# Patient Record
Sex: Female | Born: 1955 | Race: White | Hispanic: No | Smoking: Current every day smoker
Health system: Southern US, Community
[De-identification: ages and names within clinical notes are randomized; demographics above are authoritative.]

## PROBLEM LIST (undated history)

## (undated) DIAGNOSIS — Z950 Presence of cardiac pacemaker: Secondary | ICD-10-CM

## (undated) DIAGNOSIS — J449 Chronic obstructive pulmonary disease, unspecified: Secondary | ICD-10-CM

## (undated) DIAGNOSIS — E039 Hypothyroidism, unspecified: Secondary | ICD-10-CM

## (undated) DIAGNOSIS — I639 Cerebral infarction, unspecified: Secondary | ICD-10-CM

## (undated) DIAGNOSIS — I219 Acute myocardial infarction, unspecified: Secondary | ICD-10-CM

## (undated) DIAGNOSIS — Z95810 Presence of automatic (implantable) cardiac defibrillator: Secondary | ICD-10-CM

## (undated) HISTORY — PX: ABDOMINAL HYSTERECTOMY: SHX81

## (undated) HISTORY — PX: CHOLECYSTECTOMY: SHX55

## (undated) HISTORY — PX: INSERT / REPLACE / REMOVE PACEMAKER: SUR710

## (undated) HISTORY — PX: THYROIDECTOMY: SHX17

## (undated) HISTORY — PX: TONSILLECTOMY: SUR1361

---

## 2003-10-01 ENCOUNTER — Other Ambulatory Visit: Payer: Self-pay

## 2004-04-14 ENCOUNTER — Other Ambulatory Visit: Payer: Self-pay

## 2005-01-28 ENCOUNTER — Emergency Department: Payer: Self-pay | Admitting: Unknown Physician Specialty

## 2005-01-28 ENCOUNTER — Other Ambulatory Visit: Payer: Self-pay

## 2006-06-06 DIAGNOSIS — I10 Essential (primary) hypertension: Secondary | ICD-10-CM | POA: Insufficient documentation

## 2006-12-19 ENCOUNTER — Ambulatory Visit: Payer: Self-pay | Admitting: Internal Medicine

## 2007-01-14 ENCOUNTER — Other Ambulatory Visit: Payer: Self-pay

## 2007-01-15 ENCOUNTER — Inpatient Hospital Stay: Payer: Self-pay | Admitting: Internal Medicine

## 2007-05-30 DIAGNOSIS — I447 Left bundle-branch block, unspecified: Secondary | ICD-10-CM | POA: Insufficient documentation

## 2007-05-31 DIAGNOSIS — K219 Gastro-esophageal reflux disease without esophagitis: Secondary | ICD-10-CM | POA: Insufficient documentation

## 2007-11-14 DIAGNOSIS — I4892 Unspecified atrial flutter: Secondary | ICD-10-CM | POA: Insufficient documentation

## 2008-12-09 DIAGNOSIS — K2210 Ulcer of esophagus without bleeding: Secondary | ICD-10-CM | POA: Insufficient documentation

## 2009-03-08 ENCOUNTER — Ambulatory Visit: Payer: Self-pay | Admitting: Internal Medicine

## 2009-11-17 DIAGNOSIS — E559 Vitamin D deficiency, unspecified: Secondary | ICD-10-CM | POA: Insufficient documentation

## 2010-12-30 DIAGNOSIS — E039 Hypothyroidism, unspecified: Secondary | ICD-10-CM | POA: Insufficient documentation

## 2011-03-02 DIAGNOSIS — G5600 Carpal tunnel syndrome, unspecified upper limb: Secondary | ICD-10-CM | POA: Insufficient documentation

## 2011-03-22 DIAGNOSIS — M5137 Other intervertebral disc degeneration, lumbosacral region: Secondary | ICD-10-CM | POA: Insufficient documentation

## 2011-03-29 DIAGNOSIS — G2581 Restless legs syndrome: Secondary | ICD-10-CM | POA: Insufficient documentation

## 2011-04-12 DIAGNOSIS — IMO0002 Reserved for concepts with insufficient information to code with codable children: Secondary | ICD-10-CM | POA: Insufficient documentation

## 2012-06-02 ENCOUNTER — Encounter: Payer: Self-pay | Admitting: Specialist

## 2012-06-07 DIAGNOSIS — M940 Chondrocostal junction syndrome [Tietze]: Secondary | ICD-10-CM | POA: Insufficient documentation

## 2012-06-13 ENCOUNTER — Encounter: Payer: Self-pay | Admitting: Specialist

## 2012-07-14 ENCOUNTER — Encounter: Payer: Self-pay | Admitting: Specialist

## 2012-08-01 DIAGNOSIS — F32A Depression, unspecified: Secondary | ICD-10-CM | POA: Insufficient documentation

## 2012-08-13 ENCOUNTER — Encounter: Payer: Self-pay | Admitting: Specialist

## 2012-12-01 LAB — COMPREHENSIVE METABOLIC PANEL
Albumin: 3.6 g/dL (ref 3.4–5.0)
Anion Gap: 10 (ref 7–16)
BUN: 30 mg/dL — ABNORMAL HIGH (ref 7–18)
Bilirubin,Total: 0.3 mg/dL (ref 0.2–1.0)
Chloride: 106 mmol/L (ref 98–107)
Co2: 22 mmol/L (ref 21–32)
Glucose: 152 mg/dL — ABNORMAL HIGH (ref 65–99)
Osmolality: 285 (ref 275–301)
SGPT (ALT): 44 U/L (ref 12–78)
Sodium: 138 mmol/L (ref 136–145)
Total Protein: 7.6 g/dL (ref 6.4–8.2)

## 2012-12-01 LAB — TROPONIN I: Troponin-I: <0.02 ng/mL

## 2012-12-01 LAB — CBC
HCT: 44.2 % (ref 35.0–47.0)
Platelet: 281 10*3/uL (ref 150–440)
RDW: 14 % (ref 11.5–14.5)
WBC: 8.5 10*3/uL (ref 3.6–11.0)

## 2012-12-02 ENCOUNTER — Observation Stay: Payer: Self-pay | Admitting: Internal Medicine

## 2012-12-02 LAB — TROPONIN I
Troponin-I: <0.02 ng/mL
Troponin-I: <0.02 ng/mL

## 2012-12-02 LAB — CK TOTAL AND CKMB (NOT AT ARMC)
CK, Total: 97 U/L
CK-MB: 1.6 ng/mL (ref 0.5–3.6)
CK-MB: 1.9 ng/mL

## 2013-01-16 DIAGNOSIS — M47817 Spondylosis without myelopathy or radiculopathy, lumbosacral region: Secondary | ICD-10-CM | POA: Insufficient documentation

## 2013-07-10 DIAGNOSIS — J441 Chronic obstructive pulmonary disease with (acute) exacerbation: Secondary | ICD-10-CM | POA: Insufficient documentation

## 2013-07-11 DIAGNOSIS — R32 Unspecified urinary incontinence: Secondary | ICD-10-CM | POA: Insufficient documentation

## 2013-11-29 DIAGNOSIS — G4700 Insomnia, unspecified: Secondary | ICD-10-CM | POA: Insufficient documentation

## 2013-12-22 ENCOUNTER — Emergency Department: Payer: Self-pay | Admitting: Emergency Medicine

## 2014-07-16 DIAGNOSIS — G8929 Other chronic pain: Secondary | ICD-10-CM | POA: Insufficient documentation

## 2014-11-25 ENCOUNTER — Emergency Department: Payer: Self-pay | Admitting: Emergency Medicine

## 2015-01-03 NOTE — Discharge Summary (Signed)
PATIENT NAME:  Rebecca Singh, Rebecca Singh MR#:  [id number] DATE OF BIRTH:  12-07-1955  DATE OF ADMISSION:  12/02/2012 DATE OF DISCHARGE:  12/03/2012  PRIMARY CARE PHYSICIAN:  Graylon Good at [facility] family clinic.   FINAL DIAGNOSES: 1.  Chest pain and back pain, likely musculoskeletal in nature.  2.  Relative hypotension and bradycardia.  3.  History of coronary artery disease.  4.  History of congestive heart failure, no signs on this hospital stay.  5.  Hypothyroidism.   MEDICATIONS ON DISCHARGE:  Include:  Wellbutrin 150 mg extended release 1 tablet twice a day, levothyroxine 150 mcg 1 tablet daily, Paxil 20 mg 3 tablets at bedtime, clonazepam 0.5 mg 3 times a day, trazodone 50 mg 3 times a day, furosemide 20 mg daily. Prednisone 5 mg, 4 tablets day 1, 3 tablets day 2, 2 tablets day 3, 1 tablet day 4 and 5.  Voltaren sodium 100 mg daily, acetaminophen/oxycodone 325/5, 1 tablet every 4 hours as needed for pain; lisinopril 10 mg daily, metoprolol 25 mg daily, baclofen 10 mg 3 times a day as needed for muscle spasm,   DIET: Regular consistency, low sodium.   ACTIVITY: As tolerated.   FOLLOWUP:  In 1 to 2 weeks with Graylon Good at [facility] family care.   REASON FOR ADMISSION:  The patient was admitted as an observation December 02, 2012, and discharged December 03, 2012, came in with chest pain, more musculoskeletal in nature.   LABORATORY AND RADIOLOGICAL DATA DURING THE HOSPITAL COURSE:  Included: Troponin negative x 3. D-dimer borderline at 0.52. Glucose 152, BUN 30, creatinine 1.24, sodium 138, potassium 4.5, chloride 106, CO2 22, calcium 8.8, AST slightly elevated at 40. White blood cell count 8.5, H and H 14.8 and 44.2, platelet count of 281. Chest x-ray showed cannot exclude mild heart failure. CT scan of the chest showed findings consistent with bilateral  pulmonary mild infiltrates, cardiomegaly. A differential includes heart failure and/or pneumonitis. Cardiac enzymes x 3 were negative.     FINAL  DIAGNOSES HOSPITAL COURSE PER PROBLEM LIST:  1.  For the patient's chest pain and back pain, likely musculoskeletal in nature tender to palpation.  The patient's baclofen was increased to 3 times a day. The patient was given a trial of IV Solu-Medrol. Pain was better the next day, given a prednisone taper upon discharge. Likely musculoskeletal in nature. Cardiac enzymes were negative. This is not a myocardial infarction.  2.  Relative hypotension and bradycardia. The patient's Toprol dose was decreased down to 25 mg, lisinopril decreased down to 10 mg. Blood pressure 110/61 upon discharge, heart rate 65.  3.  History of heart disease. The patient is on a low-dose metoprolol.  4.  History of congestive heart failure. No signs on this hospital stay. Lungs were clear. I do not believe that this is heart failure. I do not believe that this is pneumonia either. The patient had no white count, no fever, no cough.  5.  For the patient's hypothyroidism, the patient is on levothyroxine.  6.  The patient is on numerous psychiatric medications, can follow up as outpatient. I made no changes in these. He is on a strange regimen with trazodone 3 times a day.   TIME SPENT ON DISCHARGE: 35 minutes.      ____________________________ Herschell Dimes. Renae Gloss, MD rjw:cs D: 12/03/2012 14:22:00 ET T: 12/03/2012 15:15:07 ET JOB#: [id number]  cc: Herschell Dimes. Renae Gloss, MD, <Dictator> DR. Graylon Good AT [facility] Salley Scarlet MD ELECTRONICALLY  SIGNED 12/13/2012 10:31

## 2015-01-03 NOTE — H&P (Signed)
PATIENT NAME:  Rebecca Singh, Rebecca Singh MR#:  [id number] DATE OF BIRTH:  March 16, 1956  DATE OF ADMISSION:  12/02/2012  PRIMARY CARE PHYSICIAN:   at [facility] family practice.     CHIEF COMPLAINT:  Chest pain.   HISTORY OF PRESENT ILLNESS:  The patient is a 59 year old white female with past medical history of hypertension, congestive heart failure with EF of 30% per the patient, restless legs syndrome who is trying to apply for disability, comes to the Emergency Department with complaints of chest pain on the right side of the chest, started at the back of the chest radiating all the way up to the left chest. The pain is 10 by intensity, sharp in nature. The patient experiences more squeezing pain in the left chest. The pain started at 3:00 p.m. of 21st March 2014. Workup in the Emergency Department of the EKG shows somewhat left bundle branch block; however, cardiac enzymes are negative. The patient was given nitroglycerin and morphine without much improvement in the pain. The patient states this was associated with shortness of breath, the pain also with some nausea.   PAST MEDICAL HISTORY: 1.  Hypertension.  2.  Congestive heart failure with EF of 30% per the patient. 3.  Restless legs syndrome.  4.  Depression, anxiety. 5.  Gastroesophageal reflux disease. 6.  Hiatal hernia. 7.  Carpal tunnel syndrome.   PAST SURGICAL HISTORY: 1.  Back surgery.  2.  Thyroid surgery.  3.  Hysterectomy.  4.  Cholecystectomy.  5.  Bone spurs on the back.   ALLERGIES:  CODEINE AND ASPIRIN.   HOME MEDICATIONS: 1.  (Dictation Anomaly) 100 mg daily.  2.  Trazodone 50 mg 3 times a day.  3.  Percocet (Dictation Anomaly)  1 to 2 tablets 4 times a day.  4.  Paroxetine 3 tablets 20 mg once a day.  5.  Metoprolol 2 tablets once a day.  6.  Lisinopril 20 mg once a day.  7.  Levothyroxine 150 mcg once a day.  8.  Lasix 20 mg once a day.  9.  Clonazepam 0.5 mg 3 times a day.  10.  Budeprion 150 mg oral 2 times a day.   11.  Baclofen 10 mg once a day.   SOCIAL HISTORY: Smoked in the past 1 pack a day, quit in November 2011. Drinks alcohol occasionally. Denies using any illicit drugs. Currently lives with her husband.  Currently trying to apply for disability.   FAMILY HISTORY: Strong family history of diabetes mellitus. Heart problems run in the family.   REVIEW OF SYSTEMS:  No generalized weakness or fatigue.  EYES: No change in vision.  No eye discharge.  ENT: No decreased hearing. No sore throat.  RESPIRATORY: Has shortness of breath. No cough.  CARDIOVASCULAR: Chest pain with some palpitations.  GASTROINTESTINAL: No nausea, vomiting or diarrhea.  GENITOURINARY: No increased frequency, urgency of urination or  UTI.  ENDOCRINE:  No polyuria or polydipsia.  NEUROLOGIC:  No weakness, dizziness, numbness.  SKIN: No rash or lesions. PSYCHIATRIC:  Has history of depression, anxiety.   PHYSICAL EXAMINATION: GENERAL: This is a well-developed, well-nourished, age-appropriate female lying down in the bed, not in distress.  VITAL SIGNS: Temperature 97.7 pulse 52, blood pressure 104/64, respiratory rate of 18, oxygen saturation is 99% on room air.  HEENT: Normocephalic, atraumatic. There is no scleral icterus. Conjunctivae normal. Pupils equal and react to light. Mucous membranes moist.  NECK: Supple. No lymphadenopathy. No JVD. No carotid bruit.  CHEST:  Has focal tenderness on the right side of the right subscapular area, as well as left precordial area LUNGS:  Bilateral clear to auscultation.  HEART: S1 and S2 regular. No murmurs are heard.  ABDOMEN: Bowel sounds present. Soft, nontender, nondistended.  EXTREMITIES: No pedal edema. Pulses 2+.  NEUROLOGIC: The patient is alert, oriented to place, person and time. Cranial nerves II through XII intact. No motor and sensory deficits.   LABORATORY DATA: CBC is completely within normal limits. CMP is completely within normal limits. Troponin is negative. D-dimer  0.52, CK-MB 16, troponin less than 0.02.  CK-MB 1.6.  Chest x-ray, one view portable:  No acute cardiopulmonary disease.  EKG:  Left bundle branch block. We do not have any baseline to compare.   ASSESSMENT AND PLAN: The patient is a 59 year old female comes to the Emergency Department with chest pain. 1.  Chest pain:  This is more of a musculoskeletal pain. One set of cardiac enzymes as negative while patient having chest pain for the last 12 hours with normal CK-MB.  We will continue to cycle cardiac enzymes x 3 for negative. The patient could be discharged home with the followup with her primary care physician.  2.  Hypertension:  Currently well-controlled. Continue with home medications.  3.  Obesity:  Counseled with the patient.  4.  Keep the patient on deep vein thrombosis prophylaxis with Lovenox.   TIME SPENT:  40 minutes   ____________________________ Susa Griffins, MD pv:ce D: 12/02/2012 06:28:58 ET T: 12/02/2012 13:11:58 ET JOB#: [id number]  cc: Susa Griffins, MD, <Dictator>  Dr. Amil Amen (Dictation Anomaly) at [facility] Zanobia Griebel MD ELECTRONICALLY SIGNED 12/18/2012 8:14

## 2015-06-03 DIAGNOSIS — R0603 Acute respiratory distress: Secondary | ICD-10-CM | POA: Insufficient documentation

## 2017-11-16 ENCOUNTER — Emergency Department: Payer: Medicaid Other

## 2017-11-16 ENCOUNTER — Observation Stay
Admit: 2017-11-16 | Discharge: 2017-11-16 | Disposition: A | Discharge status: Home / Self Care | Payer: Medicaid Other | Attending: Internal Medicine | Admitting: Internal Medicine

## 2017-11-16 ENCOUNTER — Observation Stay
Admission: EM | Admit: 2017-11-16 | Discharge: 2017-11-17 | Disposition: A | Discharge status: Home / Self Care | Payer: Medicaid Other | Attending: Internal Medicine | Admitting: Internal Medicine

## 2017-11-16 ENCOUNTER — Encounter: Payer: Self-pay | Admitting: Emergency Medicine

## 2017-11-16 ENCOUNTER — Other Ambulatory Visit: Payer: Self-pay

## 2017-11-16 DIAGNOSIS — Z79899 Other long term (current) drug therapy: Secondary | ICD-10-CM | POA: Diagnosis not present

## 2017-11-16 DIAGNOSIS — F329 Major depressive disorder, single episode, unspecified: Secondary | ICD-10-CM | POA: Insufficient documentation

## 2017-11-16 DIAGNOSIS — F419 Anxiety disorder, unspecified: Secondary | ICD-10-CM | POA: Insufficient documentation

## 2017-11-16 DIAGNOSIS — R079 Chest pain, unspecified: Secondary | ICD-10-CM | POA: Diagnosis present

## 2017-11-16 DIAGNOSIS — Z8673 Personal history of transient ischemic attack (TIA), and cerebral infarction without residual deficits: Secondary | ICD-10-CM | POA: Diagnosis not present

## 2017-11-16 DIAGNOSIS — I509 Heart failure, unspecified: Secondary | ICD-10-CM | POA: Diagnosis not present

## 2017-11-16 DIAGNOSIS — E119 Type 2 diabetes mellitus without complications: Secondary | ICD-10-CM | POA: Diagnosis not present

## 2017-11-16 DIAGNOSIS — E039 Hypothyroidism, unspecified: Secondary | ICD-10-CM | POA: Insufficient documentation

## 2017-11-16 DIAGNOSIS — I252 Old myocardial infarction: Secondary | ICD-10-CM | POA: Insufficient documentation

## 2017-11-16 DIAGNOSIS — Z7982 Long term (current) use of aspirin: Secondary | ICD-10-CM | POA: Diagnosis not present

## 2017-11-16 DIAGNOSIS — K219 Gastro-esophageal reflux disease without esophagitis: Secondary | ICD-10-CM | POA: Diagnosis not present

## 2017-11-16 DIAGNOSIS — Z66 Do not resuscitate: Secondary | ICD-10-CM | POA: Diagnosis not present

## 2017-11-16 DIAGNOSIS — I2510 Atherosclerotic heart disease of native coronary artery without angina pectoris: Secondary | ICD-10-CM | POA: Insufficient documentation

## 2017-11-16 DIAGNOSIS — Z23 Encounter for immunization: Secondary | ICD-10-CM | POA: Insufficient documentation

## 2017-11-16 DIAGNOSIS — I110 Hypertensive heart disease with heart failure: Secondary | ICD-10-CM | POA: Insufficient documentation

## 2017-11-16 DIAGNOSIS — J449 Chronic obstructive pulmonary disease, unspecified: Secondary | ICD-10-CM | POA: Insufficient documentation

## 2017-11-16 DIAGNOSIS — J9811 Atelectasis: Principal | ICD-10-CM | POA: Insufficient documentation

## 2017-11-16 DIAGNOSIS — F17210 Nicotine dependence, cigarettes, uncomplicated: Secondary | ICD-10-CM | POA: Insufficient documentation

## 2017-11-16 DIAGNOSIS — K760 Fatty (change of) liver, not elsewhere classified: Secondary | ICD-10-CM | POA: Insufficient documentation

## 2017-11-16 DIAGNOSIS — E875 Hyperkalemia: Secondary | ICD-10-CM | POA: Diagnosis not present

## 2017-11-16 DIAGNOSIS — Z95810 Presence of automatic (implantable) cardiac defibrillator: Secondary | ICD-10-CM | POA: Insufficient documentation

## 2017-11-16 HISTORY — DX: Hypothyroidism, unspecified: E03.9

## 2017-11-16 HISTORY — DX: Presence of automatic (implantable) cardiac defibrillator: Z95.810

## 2017-11-16 HISTORY — DX: Acute myocardial infarction, unspecified: I21.9

## 2017-11-16 HISTORY — DX: Cerebral infarction, unspecified: I63.9

## 2017-11-16 HISTORY — DX: Presence of cardiac pacemaker: Z95.0

## 2017-11-16 HISTORY — DX: Chronic obstructive pulmonary disease, unspecified: J44.9

## 2017-11-16 LAB — CBC
HCT: 49.4 % — ABNORMAL HIGH (ref 35.0–47.0)
HEMOGLOBIN: 16.3 g/dL — AB (ref 12.0–16.0)
MCH: 29.8 pg (ref 26.0–34.0)
MCHC: 32.9 g/dL (ref 32.0–36.0)
MCV: 90.7 fL (ref 80.0–100.0)
PLATELETS: 311 10*3/uL (ref 150–440)
RBC: 5.45 MIL/uL — AB (ref 3.80–5.20)
RDW: 14.8 % — ABNORMAL HIGH (ref 11.5–14.5)
WBC: 9.7 10*3/uL (ref 3.6–11.0)

## 2017-11-16 LAB — BASIC METABOLIC PANEL
Anion gap: 10 (ref 5–15)
BUN: 34 mg/dL — ABNORMAL HIGH (ref 6–20)
CALCIUM: 10 mg/dL (ref 8.9–10.3)
CO2: 23 mmol/L (ref 22–32)
CREATININE: 1 mg/dL (ref 0.44–1.00)
Chloride: 106 mmol/L (ref 101–111)
GFR, EST NON AFRICAN AMERICAN: 59 mL/min — AB (ref >60)
Glucose, Bld: 99 mg/dL (ref 65–99)
Potassium: 5.1 mmol/L (ref 3.5–5.1)
Sodium: 139 mmol/L (ref 135–145)

## 2017-11-16 LAB — TROPONIN I

## 2017-11-16 LAB — BRAIN NATRIURETIC PEPTIDE: B Natriuretic Peptide: 107 pg/mL — ABNORMAL HIGH (ref 0.0–100.0)

## 2017-11-16 LAB — TSH: TSH: 10.791 u[IU]/mL — ABNORMAL HIGH (ref 0.350–4.500)

## 2017-11-16 MED ORDER — TRAZODONE HCL 50 MG PO TABS: 25.0000 mg | ORAL_TABLET | Freq: Every evening | ORAL | Status: DC | PRN

## 2017-11-16 MED ORDER — ACETAMINOPHEN 325 MG PO TABS
650.0000 mg | ORAL_TABLET | Freq: Four times a day (QID) | ORAL | Status: DC | PRN
  Administered 2017-11-17: 650 mg via ORAL
  Filled 2017-11-16: qty 2

## 2017-11-16 MED ORDER — METOPROLOL TARTRATE 25 MG PO TABS
25.0000 mg | ORAL_TABLET | Freq: Two times a day (BID) | ORAL | Status: DC
  Administered 2017-11-17: 25 mg via ORAL
  Filled 2017-11-16 (×2): qty 1

## 2017-11-16 MED ORDER — FUROSEMIDE 20 MG PO TABS
20.0000 mg | ORAL_TABLET | Freq: Every day | ORAL | Status: DC
  Administered 2017-11-16 – 2017-11-17 (×2): 20 mg via ORAL
  Filled 2017-11-16 (×2): qty 1

## 2017-11-16 MED ORDER — MORPHINE SULFATE (PF) 4 MG/ML IV SOLN
4.0000 mg | Freq: Once | INTRAVENOUS | Status: AC
  Administered 2017-11-16: 4 mg via INTRAVENOUS
  Filled 2017-11-16: qty 1

## 2017-11-16 MED ORDER — BISACODYL 5 MG PO TBEC: 5.0000 mg | DELAYED_RELEASE_TABLET | Freq: Every day | ORAL | Status: DC | PRN

## 2017-11-16 MED ORDER — HYDROCODONE-ACETAMINOPHEN 5-325 MG PO TABS
1.0000 | ORAL_TABLET | ORAL | Status: DC | PRN
  Administered 2017-11-16 – 2017-11-17 (×3): 2 via ORAL
  Filled 2017-11-16 (×3): qty 2

## 2017-11-16 MED ORDER — DOCUSATE SODIUM 100 MG PO CAPS
100.0000 mg | ORAL_CAPSULE | Freq: Two times a day (BID) | ORAL | Status: DC
  Administered 2017-11-16 – 2017-11-17 (×2): 100 mg via ORAL
  Filled 2017-11-16 (×2): qty 1

## 2017-11-16 MED ORDER — PROMETHAZINE HCL 25 MG/ML IJ SOLN
12.5000 mg | Freq: Once | INTRAMUSCULAR | Status: AC
  Administered 2017-11-16: 12.5 mg via INTRAVENOUS
  Filled 2017-11-16: qty 1

## 2017-11-16 MED ORDER — PNEUMOCOCCAL VAC POLYVALENT 25 MCG/0.5ML IJ INJ
0.5000 mL | INJECTION | INTRAMUSCULAR | Status: AC
  Administered 2017-11-17: 0.5 mL via INTRAMUSCULAR
  Filled 2017-11-16: qty 0.5

## 2017-11-16 MED ORDER — ONDANSETRON HCL 4 MG/2ML IJ SOLN
4.0000 mg | Freq: Four times a day (QID) | INTRAMUSCULAR | Status: DC | PRN
  Administered 2017-11-17: 4 mg via INTRAVENOUS
  Filled 2017-11-16: qty 2

## 2017-11-16 MED ORDER — ONDANSETRON HCL 4 MG PO TABS
4.0000 mg | ORAL_TABLET | Freq: Four times a day (QID) | ORAL | Status: DC | PRN
  Administered 2017-11-17: 4 mg via ORAL
  Filled 2017-11-16 (×2): qty 1

## 2017-11-16 MED ORDER — IOPAMIDOL (ISOVUE-370) INJECTION 76%
75.0000 mL | Freq: Once | INTRAVENOUS | Status: AC | PRN
  Administered 2017-11-16: 75 mL via INTRAVENOUS
  Filled 2017-11-16: qty 75

## 2017-11-16 MED ORDER — ASPIRIN EC 81 MG PO TBEC
81.0000 mg | DELAYED_RELEASE_TABLET | Freq: Every day | ORAL | Status: DC
  Administered 2017-11-16 – 2017-11-17 (×2): 81 mg via ORAL
  Filled 2017-11-16 (×2): qty 1

## 2017-11-16 MED ORDER — NICOTINE 21 MG/24HR TD PT24
21.0000 mg | MEDICATED_PATCH | Freq: Every day | TRANSDERMAL | Status: DC
  Administered 2017-11-16 – 2017-11-17 (×2): 21 mg via TRANSDERMAL
  Filled 2017-11-16 (×2): qty 1

## 2017-11-16 MED ORDER — ONDANSETRON HCL 4 MG/2ML IJ SOLN
4.0000 mg | Freq: Once | INTRAMUSCULAR | Status: AC
  Administered 2017-11-16: 4 mg via INTRAVENOUS
  Filled 2017-11-16: qty 2

## 2017-11-16 MED ORDER — NITROGLYCERIN 2 % TD OINT
0.5000 [in_us] | TOPICAL_OINTMENT | Freq: Four times a day (QID) | TRANSDERMAL | Status: DC
  Administered 2017-11-16 – 2017-11-17 (×3): 0.5 [in_us] via TOPICAL
  Filled 2017-11-16 (×4): qty 1

## 2017-11-16 MED ORDER — ACETAMINOPHEN 650 MG RE SUPP
650.0000 mg | Freq: Four times a day (QID) | RECTAL | Status: DC | PRN
  Filled 2017-11-16: qty 1

## 2017-11-16 MED ORDER — NITROGLYCERIN 0.4 MG SL SUBL
0.4000 mg | SUBLINGUAL_TABLET | SUBLINGUAL | Status: DC | PRN
  Administered 2017-11-16: 0.4 mg via SUBLINGUAL
  Filled 2017-11-16: qty 1

## 2017-11-16 MED ORDER — ENOXAPARIN SODIUM 40 MG/0.4ML ~~LOC~~ SOLN
40.0000 mg | SUBCUTANEOUS | Status: DC
  Administered 2017-11-16: 40 mg via SUBCUTANEOUS
  Filled 2017-11-16: qty 0.4

## 2017-11-16 MED ORDER — INFLUENZA VAC SPLIT QUAD 0.5 ML IM SUSY
0.5000 mL | PREFILLED_SYRINGE | INTRAMUSCULAR | Status: AC
  Administered 2017-11-17: 0.5 mL via INTRAMUSCULAR
  Filled 2017-11-16: qty 0.5

## 2017-11-16 NOTE — H&P (Signed)
[facility] [place] at [facility]   PATIENT NAME: Rebecca Singh    MR#:  [id number]  DATE OF BIRTH:  November 25, 1955  DATE OF ADMISSION:  11/16/2017  PRIMARY CARE PHYSICIAN: [facility]   REQUESTING/REFERRING PHYSICIAN: Minna Antis  CHIEF COMPLAINT: Chest pain   Chief Complaint  Patient presents with  . Chest Pain    HISTORY OF PRESENT ILLNESS:  Rebecca Singh  is a 62 y.o. female with a known history of nonischemic cardiomyopathy status post ICD, PPM follows up at [facility] cardiology comes in with left-sided chest pain started last night associated with radiation of pain to left hand, feels like crushing type of pain associated with nausea.  Denies any diaphoresis.  No cough, no fever.  Patient told me that she is not taking any medicines for the past 1 year for her heart or depression.  her cardiologist is aware that she is not taking any medicines.  They told her that she needs to take her heart medicines.  Patient continues to smoke about 1 pack of cigarettes a day.  Denies shortness of breath but complains of chest pain patient received couple of doses of morphine, sublingual nitro in the emergency room.  PAST MEDICAL HISTORY:   Past Medical History:  Diagnosis Date  . Pacemaker/defibrillator   Patient had defibrillator placed [phone] for nonischemic cardiopathy severe LV dysfunction,   PAST SURGICAL HISTOIRY:  History reviewed. No pertinent surgical history. History of ICD, PPM placement, history of total abdominal hysterectomy, gallbladder surgery, SOCIAL HISTORY:    Social History   Tobacco Use  . Smoking status: Not on file  Substance Use Topics  . Alcohol use: Not on file  Shunt continues to smoke 1 pack of cigarettes a day.  No alcohol.  FAMILY HISTORY:  No family history on file. father has hypertension, brother had diabetes. DRUG ALLERGIES:   Allergies  Allergen Reactions  . Other     Pt allergic to codiene     REVIEW OF SYSTEMS:  CONSTITUTIONAL: No fever, fatigue or weakness.  EYES: No blurred or double vision.  EARS, NOSE, AND THROAT: No tinnitus or ear pain.  RESPIRATORY: No cough, shortness of breath, wheezing or hemoptysis.  CARDIOVASCULAR:left Chest pain, nausea.  No orthopnea or PND. GASTROINTESTINAL: No nausea, vomiting, diarrhea or abdominal pain.  GENITOURINARY: No dysuria, hematuria.  ENDOCRINE: No polyuria, nocturia,  HEMATOLOGY: No anemia, easy bruising or bleeding SKIN: No rash or lesion. MUSCULOSKELETAL: No joint pain or arthritis.   NEUROLOGIC: No tingling, numbness, weakness.  PSYCHIATRY: No anxiety or depression.   MEDICATIONS AT HOME:   Prior to Admission medications   Not on File      VITAL SIGNS:  Blood pressure (!) 158/101, pulse 72, temperature (!) 97.5 F (36.4 C), temperature source Oral, resp. rate 20, height 5\' 6"  (1.676 m), weight 88 kg (194 lb), SpO2 100 %.  PHYSICAL EXAMINATION:  GENERAL:  62 y.o.-year-old patient lying in the bed with no acute distress.  EYES: Pupils equal, round, reactive to light  accommodation. No scleral icterus. Extraocular muscles intact.  HEENT: Head atraumatic, normocephalic. Oropharynx and nasopharynx clear.  NECK:  Supple, no jugular venous distention. No thyroid enlargement, no tenderness.  LUNGS: Normal breath sounds bilaterally, no wheezing, rales,rhonchi or crepitation. No use of accessory muscles of respiration.  CARDIOVASCULAR: S1, S2 normal. No murmurs, rubs, or gallops.  ABDOMEN: Soft, nontender, nondistended. Bowel sounds present. No organomegaly or mass.  EXTREMITIES: No pedal edema, cyanosis, or clubbing.  NEUROLOGIC:  Cranial nerves II through XII are intact. Muscle strength 5/5 in all extremities. Sensation intact. Gait not checked.  PSYCHIATRIC: The patient is alert and oriented x 3.  SKIN: No obvious rash, lesion, or ulcer.   LABORATORY PANEL:   CBC Recent Labs  Lab 11/16/17 1008  WBC 9.7  HGB 16.3*   HCT 49.4*  PLT 311   ------------------------------------------------------------------------------------------------------------------  Chemistries  Recent Labs  Lab 11/16/17 1008  NA 139  K 5.1  CL 106  CO2 23  GLUCOSE 99  BUN 34*  CREATININE 1.00  CALCIUM 10.0   ------------------------------------------------------------------------------------------------------------------  Cardiac Enzymes Recent Labs  Lab 11/16/17 1501  TROPONINI <0.03   ------------------------------------------------------------------------------------------------------------------  RADIOLOGY:  Dg Chest 2 View  Result Date: 11/16/2017 CLINICAL DATA:  Chest pain. EXAM: CHEST - 2 VIEW COMPARISON:  Radiographs of December 22, 2013. FINDINGS: The heart size and mediastinal contours are within normal limits. Left-sided pacemaker is unchanged in position. Left lung is clear. Mild right basilar subsegmental atelectasis or scarring is noted. Old right rib fractures are noted. IMPRESSION: Mild right basilar subsegmental atelectasis or scarring. Electronically Signed   By: Lupita Raider, M.D.   On: 11/16/2017 11:17   Ct Angio Chest Pe W And/or Wo Contrast  Result Date: 11/16/2017 CLINICAL DATA:  Chest pain. EXAM: CT ANGIOGRAPHY CHEST WITH CONTRAST TECHNIQUE: Multidetector CT imaging of the chest was performed using the standard protocol during bolus administration of intravenous contrast. Multiplanar CT image reconstructions and MIPs were obtained to evaluate the vascular anatomy. CONTRAST:  75mL ISOVUE-370 IOPAMIDOL (ISOVUE-370) INJECTION 76% COMPARISON:  Radiographs of same day.  CT scan of December 22, 2013. FINDINGS: Cardiovascular: Satisfactory opacification of the pulmonary arteries to the segmental level. No evidence of pulmonary embolism. Normal heart size. No pericardial effusion. Mediastinum/Nodes: No enlarged mediastinal, hilar, or axillary lymph nodes. Thyroid gland, trachea, and esophagus demonstrate no  significant findings. Lungs/Pleura: No pneumothorax or pleural effusion is noted. Minimal bibasilar subsegmental atelectasis is noted. Upper Abdomen: Fatty infiltration of the liver is noted. No other abnormality seen in the abdomen. Musculoskeletal: No chest wall abnormality. No acute or significant osseous findings. Review of the MIP images confirms the above findings. IMPRESSION: No definite evidence of pulmonary embolus. Probable fatty infiltration of the liver. Electronically Signed   By: Lupita Raider, M.D.   On: 11/16/2017 15:24    EKG:   Orders placed or performed during the hospital encounter of 11/16/17  . EKG 12-Lead  . EKG 12-Lead  . ED EKG within 10 minutes  . ED EKG within 10 minutes  EKG shows  85 bpm, paced rhythm.  IMPRESSION AND PLAN:  62 year old female patient with history of nonischemic cardiomyopathy status post AICD, PPM placement who follows up with [facility] cardiology comes in with left-sided chest pain today.  Patient states she stopped all her medication because she did not feel  like she need to continue.  She is stopped taking for many years.  Discussed the importance of taking medicines with the patient.  #1 left-sided chest pain nausea, troponins have been -2 times.  Because of her history of nonischemic cardiomyopathy, ICD, PPM placement continue to monitor on telemetry, cycle 1 more set of troponin, continue aspirin, beta-blockers, nitrates, check echocardiogram again, consult cardiology.  2.  History of anxiety, depression: Stable patient not on any medicines at this time continue to watch. 3.  Tobacco abuse, patient continues to smoke 1 pack of cigarettes a day, counseled to quit, offered assistance.  Counseled about 10 minutes.  Patient was recording patch. For history of COPD, no wheezing.  Noted to have atelectasis in the right lung: Watch closely closely. Hypothyroidism: Patient not taking any medicines for 1 year check TSH. 7.  Mild hyperkalemia: We will watch  closely, check BNP.  Patient supposed to take Lasix 80 mg daily.  But she stopped taking her medicines.  NP is high at start the Lasix, check daily weights.    All the records are reviewed and case discussed with ED provider. Management plans discussed with the patient, family and they are in agreement.  CODE STATUS: DNR  TOTAL TIME TAKING CARE OF THIS PATIENT: 55 minutes.    Katha Hamming M.D on 11/16/2017 at 4:42 PM  Between 7am to 6pm - Pager - [phone]  After 6pm go to [email or web address] - password [email or web address]  [facility]  Office  [phone]  CC: Primary care physician; [facility]  Note: This dictation was prepared with Dragon dictation along with smaller phrase technology. Any transcriptional errors that result from this process are unintentional.

## 2017-11-16 NOTE — ED Notes (Signed)
Pt reports constant sharp chest pain at left breast worse with movement that started at 3am, pt reports vomiting here in subwait, pt reports cardiac hx of pacer and defib installed

## 2017-11-16 NOTE — ED Notes (Signed)
Admitting MD at bedside.

## 2017-11-16 NOTE — Progress Notes (Signed)
Family Meeting Note  Advance Directive: Yes   Today a meeting took place with the Patient.  Patient is able to make decisions.  She told me that she does not want CPR or ventilator and wants to be DNR.   Additional follow-up to be provided:  orders placed in the computer. Time spent during discussion:15 minutes  Katha Hamming, MD

## 2017-11-16 NOTE — ED Provider Notes (Signed)
[facility] Emergency Department Provider Note  Time seen: 2:40 PM  I have reviewed the triage vital signs and the nursing notes.   HISTORY  Chief Complaint Chest Pain    HPI Rebecca Singh is a 62 y.o. female with a past medical history of anxiety, CHF, CAD, depression, diabetes, gastric reflux, hypertension, presents to the emergency department for left chest pain worse with deep inspiration.  Patient states possibly 1 year ago she took herself off of all of her medications because she did not like the way they made her feel.  Currently states moderate left-sided chest pain worse with deep inspiration, continues even now.  States nausea with occasional pain radiating down the left arm today.  Denies any leg pain or swelling.  Denies shortness of breath or diaphoresis.  Patient states she does not normally get chest pain.  Follows up at [facility].   Past Medical History:  Diagnosis Date  . Pacemaker/defibrillator     There are no active problems to display for this patient.   History reviewed. No pertinent surgical history.  Prior to Admission medications   Not on File    Allergies  Allergen Reactions  . Other     Pt allergic to codiene    No family history on file.  Social History Social History   Tobacco Use  . Smoking status: Not on file  Substance Use Topics  . Alcohol use: Not on file  . Drug use: Not on file    Review of Systems Constitutional: Negative for fever. Eyes: Negative for visual complaints ENT: Negative for recent illness/congestion Cardiovascular: Left chest pain worse with deep inspiration Respiratory: Negative for shortness of breath.  No cough or congestion. Gastrointestinal: Negative for abdominal pain, vomiting.  Some nausea today. Genitourinary: Negative for urinary compaints Musculoskeletal: Negative for leg pain or swelling. Skin: Negative for skin complaints  Neurological: Negative for headache All other ROS  negative  ____________________________________________   PHYSICAL EXAM:  VITAL SIGNS: ED Triage Vitals  Enc Vitals Group     BP 11/16/17 1007 (!) 158/101     Pulse Rate 11/16/17 1007 72     Resp 11/16/17 1007 20     Temp 11/16/17 1007 (!) 97.5 F (36.4 C)     Temp Source 11/16/17 1007 Oral     SpO2 11/16/17 1007 100 %     Weight 11/16/17 1007 194 lb (88 kg)     Height 11/16/17 1007 5\' 6"  (1.676 m)     Head Circumference --      Peak Flow --      Pain Score 11/16/17 1345 7     Pain Loc --      Pain Edu? --      Excl. in GC? --     Constitutional: Alert and oriented. Well appearing and in no distress. Eyes: Normal exam ENT   Head: Normocephalic and atraumatic.   Mouth/Throat: Mucous membranes are moist. Cardiovascular: Normal rate, regular rhythm. No murmur Respiratory: Normal respiratory effort without tachypnea nor retractions. Breath sounds are clear Gastrointestinal: Soft and nontender. No distention.   Musculoskeletal: Nontender with normal range of motion in all extremities. No lower extremity tenderness or edema. Neurologic:  Normal speech and language. No gross focal neurologic deficits Skin:  Skin is warm, dry and intact.  Psychiatric: Mood and affect are normal.  ____________________________________________    EKG  EKG reviewed and interpreted by myself shows normal sinus rhythm at 85 bpm, atrial sensed ventricular paced  rhythm, no concerning abnormalities.  Nonspecific ST changes.  ____________________________________________    RADIOLOGY  Chest x-ray is negative for acute abnormality, mild right basilar atelectasis.  ____________________________________________   INITIAL IMPRESSION / ASSESSMENT AND PLAN / ED COURSE  Pertinent labs & imaging results that were available during my care of the patient were reviewed by me and considered in my medical decision making (see chart for details).  Patient presents emergency department for left-sided  chest pain since this morning, worse with deep inspiration.  Differential would include ACS, pulmonary embolism, chest wall pain, pleurisy, musculoskeletal pain, pneumonia.  Patient's labs overall reassuring with a negative troponin.  Vitals show hypertension but otherwise are largely within normal limits with a normal heart rate.  Chest x-ray reassuring.  However given the patient's chest pain worse with deep inspiration we will obtain a CT angiography of the chest to rule out pulmonary embolism.  We will obtain a repeat troponin as well and treat the patient's discomfort while in the emergency department.  Patient agreeable to this plan of care.  Overall she appears well, no distress.  Patient does admit to a history of pain medication use at home but states it is been greater than 1 year.  I reviewed the patient's narcotic database records which show similar findings.  Patient's workup is largely within normal limits.  Repeat troponin negative.  CT angiography negative for PE.  Patient states continued chest pain 7/10 in severity with occasional radiation into her left arm.  Given the patient's continued chest pain despite pain medication, nitroglycerin we will admit to the hospitalist service for continued workup despite negative workup in the emergency department.  Patient agreeable to this plan of care.  ____________________________________________   FINAL CLINICAL IMPRESSION(S) / ED DIAGNOSES  Left chest pain    Minna Antis, MD 11/16/17 1623

## 2017-11-16 NOTE — ED Triage Notes (Signed)
Pt reports intermittent chest pain since 3 this am. Pt reports painful to take a deep breath. Pt denies recent URI. Pt denies recent injuries. Pain located under left breast.

## 2017-11-16 NOTE — ED Notes (Signed)
Patient transported to CT 

## 2017-11-17 ENCOUNTER — Observation Stay (HOSPITAL_BASED_OUTPATIENT_CLINIC_OR_DEPARTMENT_OTHER): Payer: Medicaid Other

## 2017-11-17 DIAGNOSIS — R079 Chest pain, unspecified: Secondary | ICD-10-CM

## 2017-11-17 LAB — BASIC METABOLIC PANEL
ANION GAP: 7 (ref 5–15)
BUN: 30 mg/dL — ABNORMAL HIGH (ref 6–20)
CALCIUM: 8.8 mg/dL — AB (ref 8.9–10.3)
CO2: 25 mmol/L (ref 22–32)
Chloride: 106 mmol/L (ref 101–111)
Creatinine, Ser: 0.8 mg/dL (ref 0.44–1.00)
GFR calc non Af Amer: >60 mL/min (ref >60)
Glucose, Bld: 91 mg/dL (ref 65–99)
Potassium: 3.9 mmol/L (ref 3.5–5.1)
Sodium: 138 mmol/L (ref 135–145)

## 2017-11-17 LAB — NM MYOCAR MULTI W/SPECT W/WALL MOTION / EF
CHL CUP NUCLEAR SSS: 12
CHL CUP RESTING HR STRESS: 56 {beats}/min
CSEPHR: 63 %
LVDIAVOL: 115 mL (ref 46–106)
LVSYSVOL: 67 mL
Peak HR: 100 {beats}/min
SDS: 2
SRS: 14
TID: 0.92

## 2017-11-17 LAB — CBC
HCT: 42.2 % (ref 35.0–47.0)
HEMOGLOBIN: 14.3 g/dL (ref 12.0–16.0)
MCH: 30.3 pg (ref 26.0–34.0)
MCHC: 33.8 g/dL (ref 32.0–36.0)
MCV: 89.6 fL (ref 80.0–100.0)
Platelets: 237 10*3/uL (ref 150–440)
RBC: 4.71 MIL/uL (ref 3.80–5.20)
RDW: 14.8 % — ABNORMAL HIGH (ref 11.5–14.5)
WBC: 5.7 10*3/uL (ref 3.6–11.0)

## 2017-11-17 LAB — ECHOCARDIOGRAM COMPLETE
HEIGHTINCHES: 66 in
Weight: 3104 oz

## 2017-11-17 LAB — TROPONIN I: Troponin I: <0.03 ng/mL (ref <0.03)

## 2017-11-17 MED ORDER — NICOTINE 21 MG/24HR TD PT24: 21.0000 mg | MEDICATED_PATCH | Freq: Every day | TRANSDERMAL | 0 refills | Status: DC

## 2017-11-17 MED ORDER — REGADENOSON 0.4 MG/5ML IV SOLN
0.4000 mg | Freq: Once | INTRAVENOUS | Status: AC
  Administered 2017-11-17: 0.4 mg via INTRAVENOUS

## 2017-11-17 MED ORDER — ASPIRIN 81 MG PO TBEC: 81.0000 mg | DELAYED_RELEASE_TABLET | Freq: Every day | ORAL | Status: DC

## 2017-11-17 MED ORDER — TECHNETIUM TC 99M TETROFOSMIN IV KIT
29.2100 | PACK | Freq: Once | INTRAVENOUS | Status: AC | PRN
  Administered 2017-11-17: 29.21 via INTRAVENOUS

## 2017-11-17 MED ORDER — TECHNETIUM TC 99M TETROFOSMIN IV KIT
13.0000 | PACK | Freq: Once | INTRAVENOUS | Status: AC | PRN
  Administered 2017-11-17: 13.43 via INTRAVENOUS

## 2017-11-17 MED ORDER — METOPROLOL TARTRATE 25 MG PO TABS: 25.0000 mg | ORAL_TABLET | Freq: Two times a day (BID) | ORAL | 0 refills | Status: DC

## 2017-11-17 NOTE — Progress Notes (Signed)
[facility]         [place], [place].   11/17/2017  Patient: [name]   Date of Birth:  [birth year]  Date of admission:  11/16/2017  Date of Discharge  11/17/2017    To Whom it May Concern:   Rebecca Singh  Was admitted to hospital and being discharged on 11/17/17.  If you have any questions or concerns, please don't hesitate to call.  Sincerely,   Altamese Dilling M.D Pager [phone] Office : [phone]   .

## 2017-11-17 NOTE — Progress Notes (Signed)
Stress test normal per Dr. Mariah Milling. Dr. Juliene Pina updated. Patient agreeable to discharge - will proceed. Patient to call ride home.

## 2017-11-17 NOTE — Discharge Summary (Signed)
[facility] - [place] at [facility]   PATIENT NAME: Rebecca Singh    MR#:  [id number]  DATE OF BIRTH:  July 28, 1956  DATE OF ADMISSION:  11/16/2017 ADMITTING PHYSICIAN: Katha Hamming, MD  DATE OF DISCHARGE: 11/17/2017  PRIMARY CARE PHYSICIAN: [facility]    ADMISSION DIAGNOSIS:  Chest pain, unspecified type [R07.9]  DISCHARGE DIAGNOSIS:  Active Problems:   Left sided chest pain   SECONDARY DIAGNOSIS:   Past Medical History:  Diagnosis Date  . AICD (automatic cardioverter/defibrillator) present   . COPD (chronic obstructive pulmonary disease) (HCC)   . Hypothyroidism   . Myocardial infarction (HCC)   . Pacemaker/defibrillator   . Stroke [facility])     HOSPITAL COURSE:  62 year-old female with history of nonischemic cardiomyopathy, pacemaker/defibrillator who presents for chest pain.  1. Chest pain: Patient was ruled out for ACS with negative troponins. Telemetry showed no acute arrhythmia. She underwent cardiac stress test which was normal.  2. Nonischemic cardiomyopathy/CAD: Patient stopped all of her medications about a year ago. She says that she will take the metoprolol. She will then follow-up with her [facility] cardiologist.  3.Tobacco dependence: Patient is encouraged to quit smoking. Counseling was provided for 4 minutes. 4. COPD without signs of exacerbation  DISCHARGE CONDITIONS AND DIET:   Stable for discharge on heart healthy diet  CONSULTS OBTAINED:    DRUG ALLERGIES:   Allergies  Allergen Reactions  . Other     Pt allergic to codiene    DISCHARGE MEDICATIONS:   Allergies as of 11/17/2017      Reactions   Other    Pt allergic to codiene      Medication List    TAKE these medications   aspirin 81 MG EC tablet Take 1 tablet (81 mg total) by mouth daily.   metoprolol tartrate 25 MG tablet Commonly known as:  LOPRESSOR Take 1 tablet (25 mg total) by mouth 2 (two) times daily.   nicotine 21 mg/24hr patch Commonly  known as:  NICODERM CQ - dosed in mg/24 hours Place 1 patch (21 mg total) onto the skin daily.         Today   CHIEF COMPLAINT:   Patient presented left sided chest pain however no chest pain overnight.   VITAL SIGNS:  Blood pressure (!) 142/89, pulse 66, temperature 98 F (36.7 C), temperature source Oral, resp. rate 20, height 5\' 6"  (1.676 m), weight 85.6 kg (188 lb 11.2 oz), SpO2 98 %.   REVIEW OF SYSTEMS:  Review of Systems  Constitutional: Negative.  Negative for chills, fever and malaise/fatigue.  HENT: Negative.  Negative for ear discharge, ear pain, hearing loss, nosebleeds and sore throat.   Eyes: Negative.  Negative for blurred vision and pain.  Respiratory: Negative.  Negative for cough, hemoptysis, shortness of breath and wheezing.   Cardiovascular: Negative.  Negative for chest pain, palpitations and leg swelling.  Gastrointestinal: Negative.  Negative for abdominal pain, blood in stool, diarrhea, nausea and vomiting.  Genitourinary: Negative.  Negative for dysuria.  Musculoskeletal: Negative.  Negative for back pain.  Skin: Negative.   Neurological: Negative for dizziness, tremors, speech change, focal weakness, seizures and headaches.  Endo/Heme/Allergies: Negative.  Does not bruise/bleed easily.  Psychiatric/Behavioral: Negative.  Negative for depression, hallucinations and suicidal ideas.     PHYSICAL EXAMINATION:  GENERAL:  62 y.o.-year-old patient lying in the bed with no acute distress.  NECK:  Supple, no jugular venous distention. No thyroid enlargement, no tenderness.  LUNGS: Normal  breath sounds bilaterally, no wheezing, rales,rhonchi  No use of accessory muscles of respiration.  CARDIOVASCULAR: S1, S2 normal. No murmurs, rubs, or gallops.  ABDOMEN: Soft, non-tender, non-distended. Bowel sounds present. No organomegaly or mass.  EXTREMITIES: No pedal edema, cyanosis, or clubbing.  PSYCHIATRIC: The patient is alert and oriented x 3.  SKIN: No  obvious rash, lesion, or ulcer.   DATA REVIEW:   CBC Recent Labs  Lab 11/17/17 0435  WBC 5.7  HGB 14.3  HCT 42.2  PLT 237    Chemistries  Recent Labs  Lab 11/17/17 0435  NA 138  K 3.9  CL 106  CO2 25  GLUCOSE 91  BUN 30*  CREATININE 0.80  CALCIUM 8.8*    Cardiac Enzymes Recent Labs  Lab 11/16/17 1850 11/16/17 2255 11/17/17 0435  TROPONINI <0.03 <0.03 <0.03    Microbiology Results  @[email or web address] @  RADIOLOGY:  Dg Chest 2 View  Result Date: 11/16/2017 CLINICAL DATA:  Chest pain. EXAM: CHEST - 2 VIEW COMPARISON:  Radiographs of December 22, 2013. FINDINGS: The heart size and mediastinal contours are within normal limits. Left-sided pacemaker is unchanged in position. Left lung is clear. Mild right basilar subsegmental atelectasis or scarring is noted. Old right rib fractures are noted. IMPRESSION: Mild right basilar subsegmental atelectasis or scarring. Electronically Signed   By: Lupita Raider, M.D.   On: 11/16/2017 11:17   Ct Angio Chest Pe W And/or Wo Contrast  Result Date: 11/16/2017 CLINICAL DATA:  Chest pain. EXAM: CT ANGIOGRAPHY CHEST WITH CONTRAST TECHNIQUE: Multidetector CT imaging of the chest was performed using the standard protocol during bolus administration of intravenous contrast. Multiplanar CT image reconstructions and MIPs were obtained to evaluate the vascular anatomy. CONTRAST:  75mL ISOVUE-370 IOPAMIDOL (ISOVUE-370) INJECTION 76% COMPARISON:  Radiographs of same day.  CT scan of December 22, 2013. FINDINGS: Cardiovascular: Satisfactory opacification of the pulmonary arteries to the segmental level. No evidence of pulmonary embolism. Normal heart size. No pericardial effusion. Mediastinum/Nodes: No enlarged mediastinal, hilar, or axillary lymph nodes. Thyroid gland, trachea, and esophagus demonstrate no significant findings. Lungs/Pleura: No pneumothorax or pleural effusion is noted. Minimal bibasilar subsegmental atelectasis is noted. Upper Abdomen: Fatty  infiltration of the liver is noted. No other abnormality seen in the abdomen. Musculoskeletal: No chest wall abnormality. No acute or significant osseous findings. Review of the MIP images confirms the above findings. IMPRESSION: No definite evidence of pulmonary embolus. Probable fatty infiltration of the liver. Electronically Signed   By: Lupita Raider, M.D.   On: 11/16/2017 15:24      Allergies as of 11/17/2017      Reactions   Other    Pt allergic to codiene      Medication List    TAKE these medications   aspirin 81 MG EC tablet Take 1 tablet (81 mg total) by mouth daily.   metoprolol tartrate 25 MG tablet Commonly known as:  LOPRESSOR Take 1 tablet (25 mg total) by mouth 2 (two) times daily.   nicotine 21 mg/24hr patch Commonly known as:  NICODERM CQ - dosed in mg/24 hours Place 1 patch (21 mg total) onto the skin daily.        }   Management plans discussed with the patient and she is in agreement. Stable for discharge home  Patient should follow up with pcp  CODE STATUS:     Code Status Orders  (From admission, onward)        Start     Ordered  11/16/17 1640  Do not attempt resuscitation (DNR)  Continuous    Question Answer Comment  In the event of cardiac or respiratory ARREST Do not call a "code blue"   In the event of cardiac or respiratory ARREST Do not perform Intubation, CPR, defibrillation or ACLS   In the event of cardiac or respiratory ARREST Use medication by any route, position, wound care, and other measures to relive pain and suffering. May use oxygen, suction and manual treatment of airway obstruction as needed for comfort.      11/16/17 1641    Code Status History    Date Active Date Inactive Code Status Order ID Comments User Context   This patient has a current code status but no historical code status.      TOTAL TIME TAKING CARE OF THIS PATIENT: 38 minutes.    Note: This dictation was prepared with Dragon dictation along with  smaller phrase technology. Any transcriptional errors that result from this process are unintentional.  Estee Yohe M.D on 11/17/2017 at 9:56 AM  Between 7am to 6pm - Pager - [phone] After 6pm go to [email or web address] - Social research officer, government  [facility]  Office  [phone]  CC: Primary care physician; [facility]

## 2017-11-17 NOTE — Progress Notes (Signed)
Patient's ride should be here around 7pm. Paperwork prepared; aware of discharge instructions and follow-up. IV and telemetry to be removed when transportation arrives.

## 2017-11-17 NOTE — Progress Notes (Signed)
Patient back from stress test and complaining of 10/10 chest pain and headache. Dr. Juliene Pina notified. No IV pain meds, patient given PRN norco. Patient can eat while we wait for test results. If stress test is negative, proceed with discharge per MD.

## 2017-11-18 LAB — HIV ANTIBODY (ROUTINE TESTING W REFLEX): HIV Screen 4th Generation wRfx: NONREACTIVE

## 2018-12-10 ENCOUNTER — Emergency Department
Admission: EM | Admit: 2018-12-10 | Discharge: 2018-12-10 | Disposition: A | Discharge status: Home / Self Care | Payer: Medicaid Other | Attending: Emergency Medicine | Admitting: Emergency Medicine

## 2018-12-10 ENCOUNTER — Encounter: Payer: Self-pay | Admitting: Emergency Medicine

## 2018-12-10 ENCOUNTER — Emergency Department: Payer: Medicaid Other

## 2018-12-10 ENCOUNTER — Other Ambulatory Visit: Payer: Self-pay

## 2018-12-10 DIAGNOSIS — F17210 Nicotine dependence, cigarettes, uncomplicated: Secondary | ICD-10-CM | POA: Insufficient documentation

## 2018-12-10 DIAGNOSIS — Z950 Presence of cardiac pacemaker: Secondary | ICD-10-CM | POA: Diagnosis not present

## 2018-12-10 DIAGNOSIS — Z79899 Other long term (current) drug therapy: Secondary | ICD-10-CM | POA: Diagnosis not present

## 2018-12-10 DIAGNOSIS — Z7982 Long term (current) use of aspirin: Secondary | ICD-10-CM | POA: Insufficient documentation

## 2018-12-10 DIAGNOSIS — J449 Chronic obstructive pulmonary disease, unspecified: Secondary | ICD-10-CM | POA: Diagnosis not present

## 2018-12-10 DIAGNOSIS — M545 Low back pain, unspecified: Secondary | ICD-10-CM

## 2018-12-10 DIAGNOSIS — E039 Hypothyroidism, unspecified: Secondary | ICD-10-CM | POA: Diagnosis not present

## 2018-12-10 DIAGNOSIS — Z8673 Personal history of transient ischemic attack (TIA), and cerebral infarction without residual deficits: Secondary | ICD-10-CM | POA: Diagnosis not present

## 2018-12-10 MED ORDER — ONDANSETRON HCL 4 MG/2ML IJ SOLN
4.0000 mg | Freq: Once | INTRAMUSCULAR | Status: AC
  Administered 2018-12-10: 4 mg via INTRAVENOUS
  Filled 2018-12-10: qty 2

## 2018-12-10 MED ORDER — HYDROMORPHONE BOLUS VIA INFUSION: 0.5000 mg | Freq: Once | INTRAVENOUS | Status: DC

## 2018-12-10 MED ORDER — HYDROMORPHONE HCL 1 MG/ML IJ SOLN
0.5000 mg | Freq: Once | INTRAMUSCULAR | Status: AC
  Administered 2018-12-10: 0.5 mg via INTRAVENOUS

## 2018-12-10 MED ORDER — KETOROLAC TROMETHAMINE 30 MG/ML IJ SOLN
30.0000 mg | Freq: Once | INTRAMUSCULAR | Status: AC
  Administered 2018-12-10: 30 mg via INTRAVENOUS
  Filled 2018-12-10: qty 1

## 2018-12-10 MED ORDER — OXYCODONE-ACETAMINOPHEN 5-325 MG PO TABS: 1.0000 | ORAL_TABLET | Freq: Four times a day (QID) | ORAL | 0 refills | Status: DC | PRN

## 2018-12-10 MED ORDER — HYDROMORPHONE HCL 1 MG/ML IJ SOLN
0.5000 mg | Freq: Once | INTRAMUSCULAR | Status: AC
  Administered 2018-12-10: 0.5 mg via INTRAVENOUS
  Filled 2018-12-10: qty 1

## 2018-12-10 MED ORDER — HYDROMORPHONE HCL 1 MG/ML IJ SOLN
1.0000 mg | Freq: Once | INTRAMUSCULAR | Status: DC
  Filled 2018-12-10: qty 1

## 2018-12-10 MED ORDER — ONDANSETRON 4 MG PO TBDP: 4.0000 mg | ORAL_TABLET | Freq: Three times a day (TID) | ORAL | 0 refills | Status: DC | PRN

## 2018-12-10 MED ORDER — PREDNISONE 10 MG (21) PO TBPK: ORAL_TABLET | ORAL | 0 refills | Status: DC

## 2018-12-10 MED ORDER — MORPHINE SULFATE (PF) 4 MG/ML IV SOLN
4.0000 mg | Freq: Once | INTRAVENOUS | Status: AC
  Administered 2018-12-10: 4 mg via INTRAVENOUS
  Filled 2018-12-10: qty 1

## 2018-12-10 MED ORDER — CYCLOBENZAPRINE HCL 10 MG PO TABS: 10.0000 mg | ORAL_TABLET | Freq: Three times a day (TID) | ORAL | 0 refills | Status: DC | PRN

## 2018-12-10 NOTE — ED Triage Notes (Signed)
Pt states she woke up around 0300 this am with intense back pain from lower to mid back "up her spine." Pt has had 2 previous back surgeries, appears in pain.

## 2018-12-10 NOTE — ED Notes (Signed)
Patient taken to imaging. 

## 2018-12-10 NOTE — ED Notes (Signed)
Theodis Aguas PA-C informed of Vital signs.

## 2018-12-10 NOTE — Discharge Instructions (Signed)
Follow-up with [facility] [place] [facility] orthopedics.  Please call for an appointment.  Use pain medication as directed.  Apply ice to lower back.  Return if worsening

## 2018-12-10 NOTE — ED Notes (Signed)
Patient tearful in bed. Appears to be in pain. Does not want to move

## 2018-12-10 NOTE — ED Provider Notes (Signed)
[facility] Emergency Department Provider Note  ____________________________________________   First MD Initiated Contact with Patient 12/10/18 1138     (approximate)  I have reviewed the triage vital signs and the nursing notes.   HISTORY  Chief Complaint Back Pain    HPI Rebecca Singh is a 63 y.o. female presents to the ER with C/o low back pain for 1 day, possible known injury, patient states she rolled over in bed and felt a pop in her back, has a history of 2 prior back surgeries, pain is worse with movement, increased with bending over, denies new numbness, tingling, or changes in bowel/urinary habits,  Using otc meds without relief Remainder ros neg, states that she feels like the pain radiates from the lower back upper spine.   Past Medical History:  Diagnosis Date  . AICD (automatic cardioverter/defibrillator) present   . COPD (chronic obstructive pulmonary disease) (HCC)   . Hypothyroidism   . Myocardial infarction (HCC)   . Pacemaker/defibrillator   . Stroke [facility])     Patient Active Problem List   Diagnosis Date Noted  . Left sided chest pain 11/16/2017    Past Surgical History:  Procedure Laterality Date  . ABDOMINAL HYSTERECTOMY    . CHOLECYSTECTOMY    . INSERT / REPLACE / REMOVE PACEMAKER    . THYROIDECTOMY    . TONSILLECTOMY      Prior to Admission medications   Medication Sig Start Date End Date Taking? Authorizing Provider  aspirin EC 81 MG EC tablet Take 1 tablet (81 mg total) by mouth daily. 11/17/17   Adrian Saran, MD  cyclobenzaprine (FLEXERIL) 10 MG tablet Take 1 tablet (10 mg total) by mouth 3 (three) times daily as needed. 12/10/18   Fisher, Roselyn Bering, PA-C  metoprolol tartrate (LOPRESSOR) 25 MG tablet Take 1 tablet (25 mg total) by mouth 2 (two) times daily. 11/17/17   Adrian Saran, MD  nicotine (NICODERM CQ - DOSED IN MG/24 HOURS) 21 mg/24hr patch Place 1 patch (21 mg total) onto the skin daily. 11/17/17   Adrian Saran,  MD  ondansetron (ZOFRAN-ODT) 4 MG disintegrating tablet Take 1 tablet (4 mg total) by mouth every 8 (eight) hours as needed. 12/10/18   Fisher, Roselyn Bering, PA-C  oxyCODONE-acetaminophen (PERCOCET/ROXICET) 5-325 MG tablet Take 1-2 tablets by mouth every 6 (six) hours as needed for severe pain. 12/10/18   Fisher, Roselyn Bering, PA-C  predniSONE (STERAPRED UNI-PAK 21 TAB) 10 MG (21) TBPK tablet Take 6 pills on day one then decrease by 1 pill each day 12/10/18   Faythe Ghee, PA-C    Allergies Codeine and Other  No family history on file.  Social History Social History   Tobacco Use  . Smoking status: Current Every Day Smoker    Packs/day: 1.00    Years: 15.00    Pack years: 15.00    Types: Cigarettes  . Smokeless tobacco: Never Used  Substance Use Topics  . Alcohol use: No    Frequency: Never  . Drug use: No    Review of Systems  Constitutional: No fever/chills Eyes: No visual changes. ENT: No sore throat. Respiratory: Denies cough Genitourinary: Negative for dysuria. Musculoskeletal: Positive for back pain. Skin: Negative for rash.    ____________________________________________   PHYSICAL EXAM:  VITAL SIGNS: ED Triage Vitals  Enc Vitals Group     BP 12/10/18 1136 (!) 151/95     Pulse Rate 12/10/18 1135 83     Resp 12/10/18 1135 20  Temp 12/10/18 1135 98 F (36.7 C)     Temp Source 12/10/18 1135 Oral     SpO2 12/10/18 1135 100 %     Weight 12/10/18 1135 192 lb (87.1 kg)     Height 12/10/18 1135 5\' 6"  (1.676 m)     Head Circumference --      Peak Flow --      Pain Score 12/10/18 1135 10     Pain Loc --      Pain Edu? --      Excl. in GC? --     Constitutional: Alert and oriented. Well appearing and in mild acute distress secondary to pain. Eyes: Conjunctivae are normal.  Head: Atraumatic. Nose: No congestion/rhinnorhea. Mouth/Throat: Mucous membranes are moist.   Neck:  supple no lymphadenopathy noted Cardiovascular: Normal rate, regular rhythm. Heart  sounds are normal Respiratory: Normal respiratory effort.  No retractions, lungs c t a  Abd: soft nontender bs normal all 4 quad GU: deferred Musculoskeletal: Decreased rom of back due to discomfort, lumbar spine and, unable to assess Slr, good strength in great toes b/l, good strength in lower legs, n/v intact Neurologic:  Normal speech and language.  Skin:  Skin is warm, dry and intact. No rash noted. Psychiatric: Mood and affect are normal. Speech and behavior are normal.  ____________________________________________   LABS (all labs ordered are listed, but only abnormal results are displayed)  Labs Reviewed - No data to display ____________________________________________   ____________________________________________  RADIOLOGY  X-ray lumbar spine is negative  ____________________________________________   PROCEDURES  Procedure(s) performed: Toradol 30 mg IV, morphine 4 mg IV, Zofran 4 mg IV, Dilaudid 0.5 mg x 2 given, and Zofran 4 mg IV repeated  Procedures    ____________________________________________   INITIAL IMPRESSION / ASSESSMENT AND PLAN / ED COURSE  Pertinent labs & imaging results that were available during my care of the patient were reviewed by me and considered in my medical decision making (see chart for details).   Patient is a 63 year old female presents emergency department with severe lower back pain that radiates up her spine.  She states she rolled over in bed and felt a pop in her back.  She states she has had 2 previous back surgeries.  Physical exam shows the patient to be in a moderate to severe amount of pain.  Her lumbar spine and lower T-spine are tender to palpation.  SI joints are tender.  Neurovascular appears intact.  Saline lock, Toradol 30 mg IV, morphine 4 mg IV, Zofran 4 mg IV  Lumbar spine x-ray ordered   Lumbar spine x-ray is negative for any acute abnormality  Patient continues to have significant pain.  She does have a  pacemaker and defibrillator so we cannot do an MRI.  She was given Dilaudid 0.5 mg x 2 with additional Zofran.  She is a little more comfortable with this.  I explained to her she will need to see orthopedics.  Return here if worsening.  She states she understands and will comply.  She was given a prescription for Percocet, Sterapred, Flexeril, and Zofran.  As part of my medical decision making, I reviewed the following data within the electronic MEDICAL RECORD NUMBER Nursing notes reviewed and incorporated, Old chart reviewed, Radiograph reviewed x-ray of the lumbar spine is negative for any acute abnormality, Notes from prior ED visits and [place] Controlled Substance Database  ____________________________________________   FINAL CLINICAL IMPRESSION(S) / ED DIAGNOSES  Final diagnoses:  Acute midline low back  pain without sciatica      NEW MEDICATIONS STARTED DURING THIS VISIT:  Discharge Medication List as of 12/10/2018  2:40 PM    START taking these medications   Details  cyclobenzaprine (FLEXERIL) 10 MG tablet Take 1 tablet (10 mg total) by mouth 3 (three) times daily as needed., Starting Sun 12/10/2018, Normal    ondansetron (ZOFRAN-ODT) 4 MG disintegrating tablet Take 1 tablet (4 mg total) by mouth every 8 (eight) hours as needed., Starting Sun 12/10/2018, Normal    oxyCODONE-acetaminophen (PERCOCET/ROXICET) 5-325 MG tablet Take 1-2 tablets by mouth every 6 (six) hours as needed for severe pain., Starting Sun 12/10/2018, Normal    predniSONE (STERAPRED UNI-PAK 21 TAB) 10 MG (21) TBPK tablet Take 6 pills on day one then decrease by 1 pill each day, Normal         Note:  This document was prepared using Dragon voice recognition software and may include unintentional dictation errors.     Faythe Ghee, PA-C 12/10/18 1557    Minna Antis, MD 12/11/18 2122

## 2018-12-10 NOTE — ED Notes (Signed)
Patient requested something to drink. PA-C aware.

## 2019-02-18 IMAGING — CT CT ANGIO CHEST
2 of 7 series · 19 of 46 positions shown · IV contrast (APPLIED)
Comparison: Radiographs of same day.  CT scan December 22, 2013.

CLINICAL DATA: Chest pain.

EXAM:
CT ANGIOGRAPHY CHEST WITH CONTRAST
TECHNIQUE: Multidetector CT imaging of the chest was performed using the
standard protocol during bolus administration of intravenous
contrast. Multiplanar CT image reconstructions and MIPs were
obtained to evaluate the vascular anatomy.
CONTRAST:  75mL LCW927-HJY IOPAMIDOL (LCW927-HJY) INJECTION 76%

[Series 5: thins (person_name) · axial · 0.64mm/px · z∈[-421,-171]mm · 16 of 280 slices shown]
[im 15/280  lung]
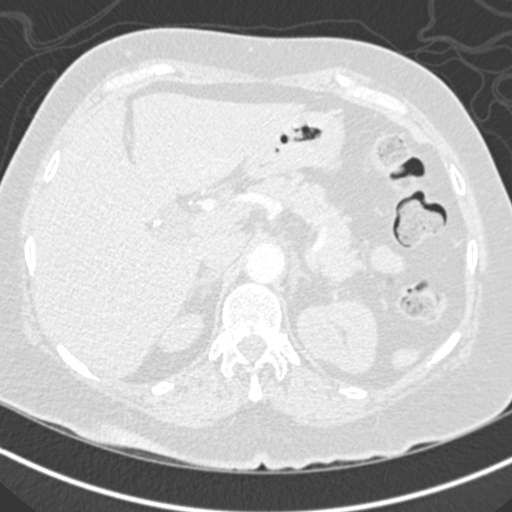
[im 30/280  soft-tissue]
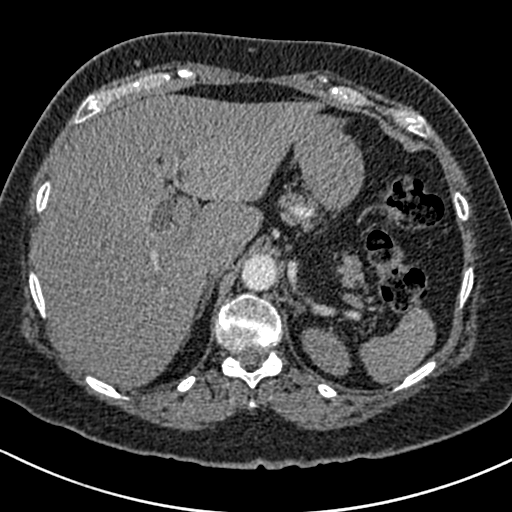
[im 45/280  lung]
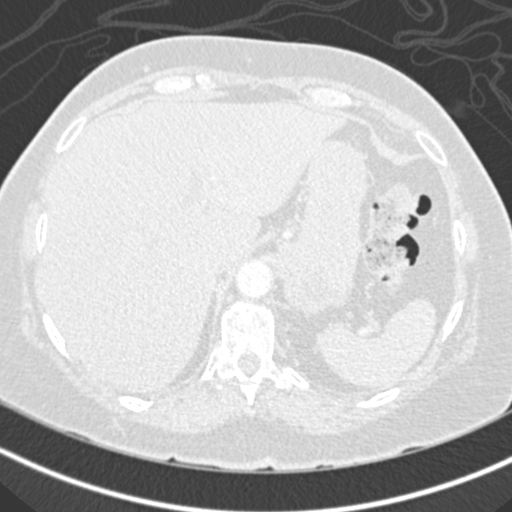
[im 59/280  soft-tissue]
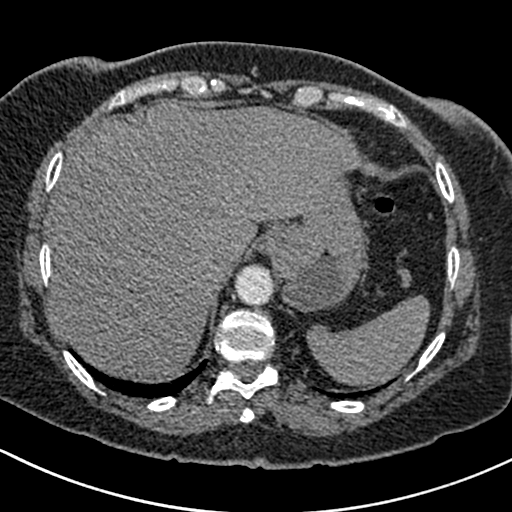
[im 89/280  lung]
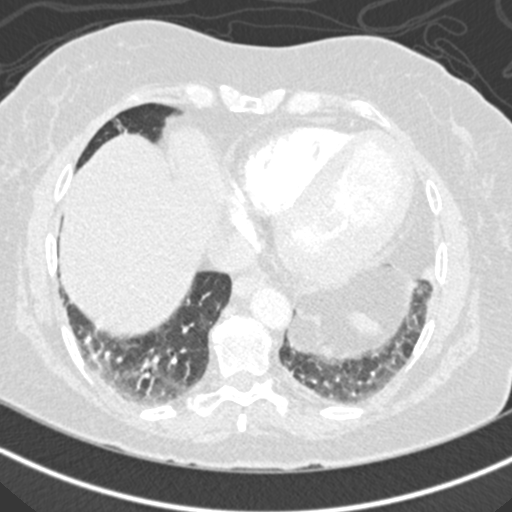
[im 103/280  soft-tissue]
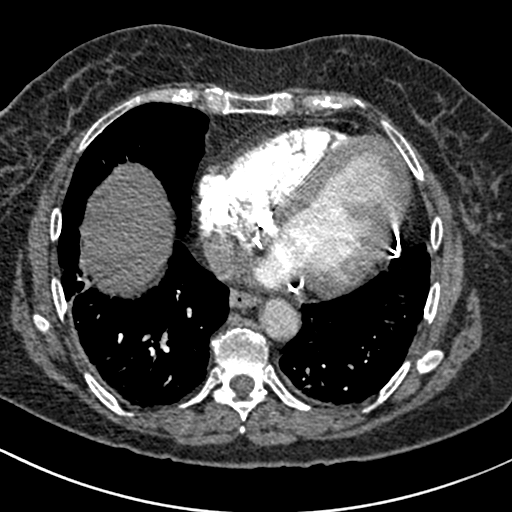
[im 118/280  lung]
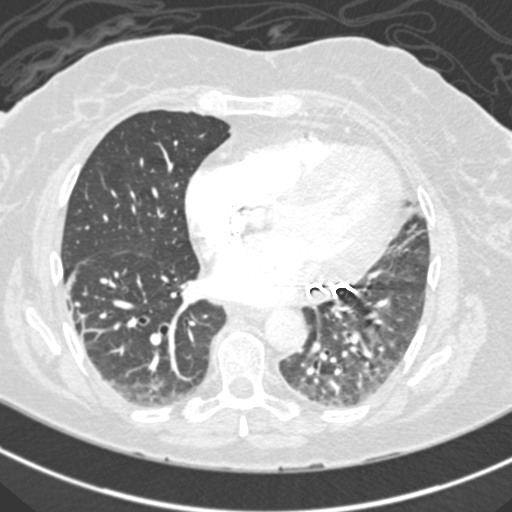
[im 133/280  soft-tissue]
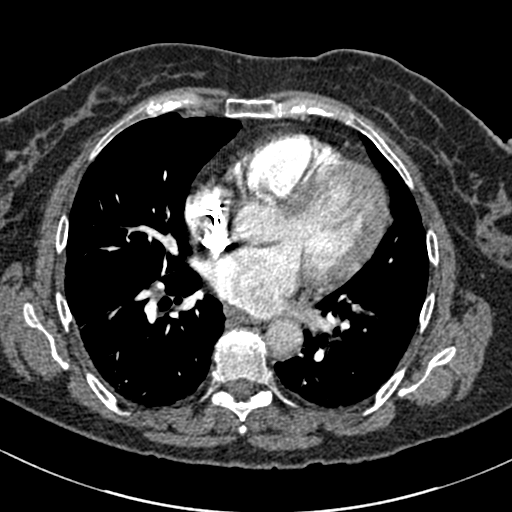
[im 147/280  lung]
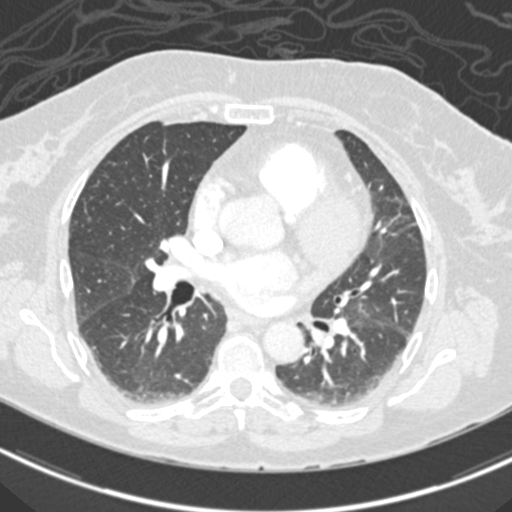
[im 162/280  soft-tissue]
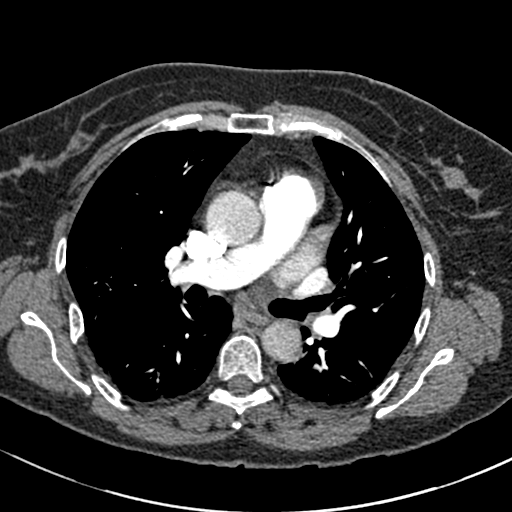
[im 177/280  lung]
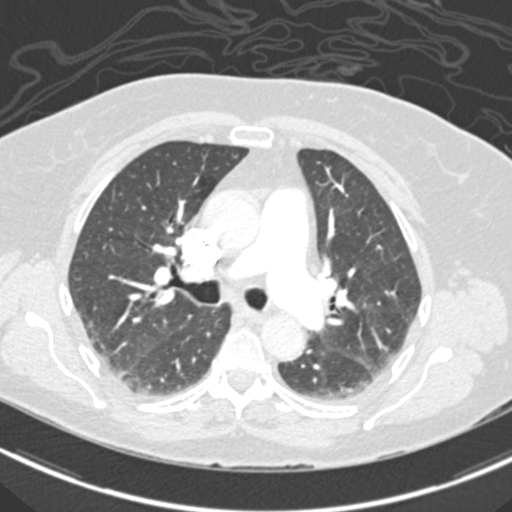
[im 191/280  soft-tissue]
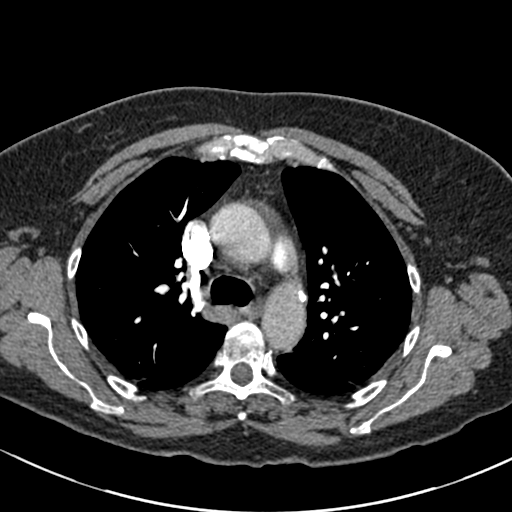
[im 221/280  lung]
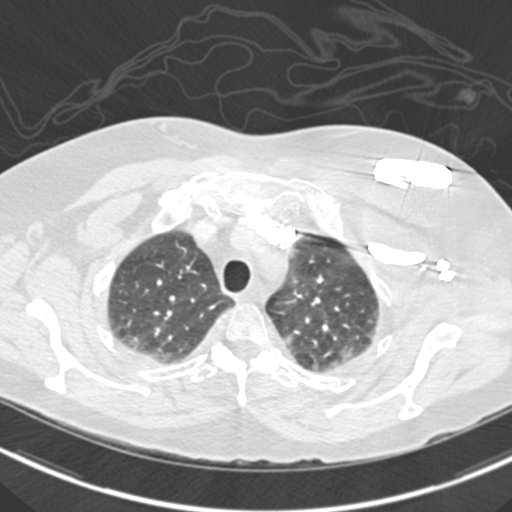
[im 235/280  soft-tissue]
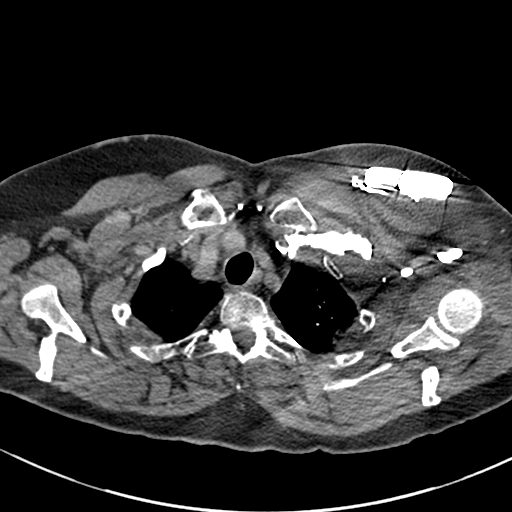
[im 250/280  lung]
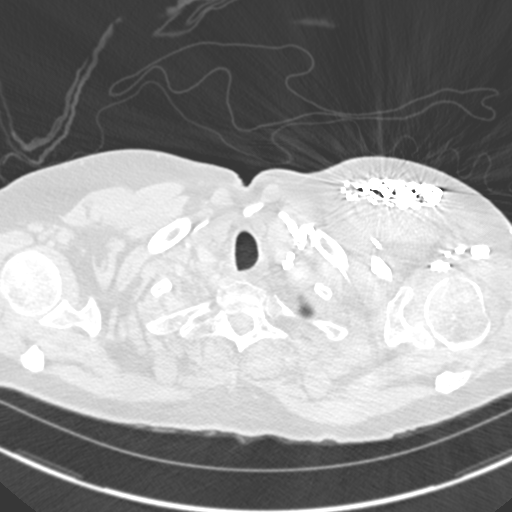
[im 265/280  soft-tissue]
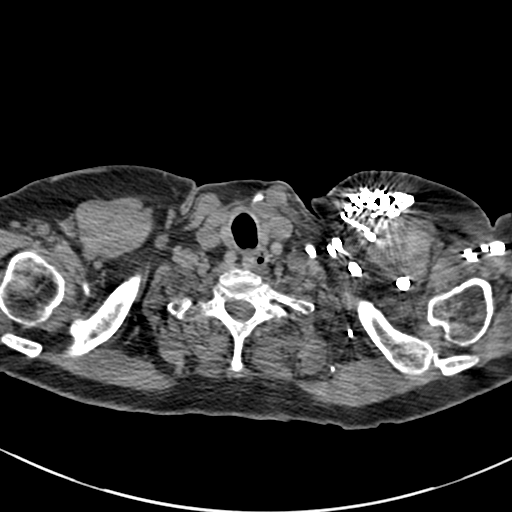

[Series 7: coronal mpr · coronal · 0.55mm/px · 3 of 85 slices shown]
[im 22/85  soft-tissue]
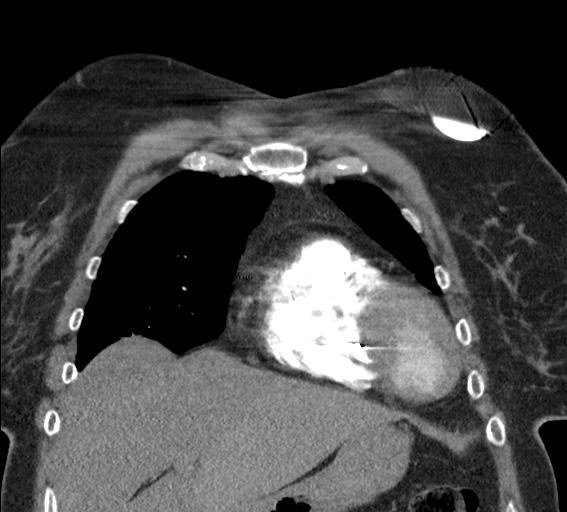
[im 43/85  soft-tissue]
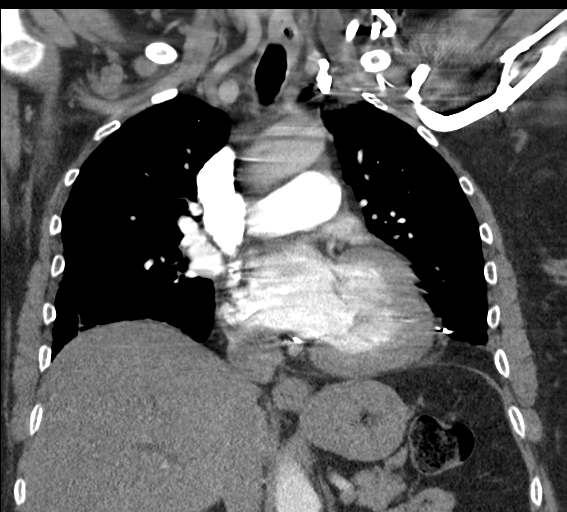
[im 64/85  soft-tissue]
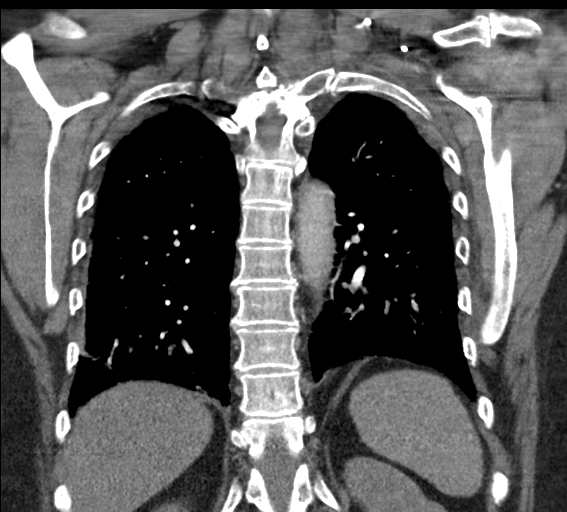

[19 of 46 positions shown; findings below may reference images not displayed]

FINDINGS: Cardiovascular: Satisfactory opacification of the pulmonary arteries
to the segmental level. No evidence of pulmonary embolism. Normal
heart size. No pericardial effusion.

Mediastinum/Nodes: No enlarged mediastinal, hilar, or axillary lymph
nodes. Thyroid gland, trachea, and esophagus demonstrate no
significant findings.

Lungs/Pleura: No pneumothorax or pleural effusion is noted. Minimal
bibasilar subsegmental atelectasis is noted.

Upper Abdomen: Fatty infiltration of the liver is noted. No other
abnormality seen in the abdomen.

Musculoskeletal: No chest wall abnormality. No acute or significant
osseous findings.

Review of the MIP images confirms the above findings.
IMPRESSION: No definite evidence of pulmonary embolus.

Probable fatty infiltration of the liver.

## 2019-07-31 ENCOUNTER — Other Ambulatory Visit: Payer: Self-pay | Admitting: Orthopedic Surgery

## 2019-07-31 ENCOUNTER — Other Ambulatory Visit (HOSPITAL_COMMUNITY): Payer: Self-pay | Admitting: Orthopedic Surgery

## 2019-07-31 DIAGNOSIS — M1712 Unilateral primary osteoarthritis, left knee: Secondary | ICD-10-CM

## 2019-08-13 ENCOUNTER — Ambulatory Visit (HOSPITAL_COMMUNITY)
Admission: RE | Admit: 2019-08-13 | Discharge: 2019-08-13 | Disposition: A | Discharge status: Home / Self Care | Payer: Medicaid Other | Source: Ambulatory Visit | Attending: Orthopedic Surgery | Admitting: Orthopedic Surgery

## 2019-08-13 ENCOUNTER — Other Ambulatory Visit: Payer: Self-pay

## 2019-08-13 DIAGNOSIS — M1712 Unilateral primary osteoarthritis, left knee: Secondary | ICD-10-CM | POA: Diagnosis not present

## 2020-05-06 DIAGNOSIS — M179 Osteoarthritis of knee, unspecified: Secondary | ICD-10-CM | POA: Insufficient documentation

## 2020-06-17 DIAGNOSIS — Z96659 Presence of unspecified artificial knee joint: Secondary | ICD-10-CM | POA: Insufficient documentation

## 2020-10-07 ENCOUNTER — Other Ambulatory Visit: Payer: Self-pay

## 2020-10-07 ENCOUNTER — Emergency Department (HOSPITAL_COMMUNITY): Payer: Medicare HMO

## 2020-10-07 ENCOUNTER — Encounter (HOSPITAL_COMMUNITY)
Admission: EM | Disposition: A | Discharge status: Rehabilitation Facility | Payer: Self-pay | Source: Home / Self Care | Attending: Neurology

## 2020-10-07 ENCOUNTER — Inpatient Hospital Stay (HOSPITAL_COMMUNITY)
Admission: EM | Admit: 2020-10-07 | Discharge: 2020-10-13 | DRG: 023 | Disposition: A | Discharge status: Rehabilitation Facility | Payer: Medicare HMO | Attending: Neurology | Admitting: Neurology

## 2020-10-07 ENCOUNTER — Emergency Department (HOSPITAL_COMMUNITY): Payer: Medicare HMO | Admitting: Certified Registered Nurse Anesthetist

## 2020-10-07 ENCOUNTER — Inpatient Hospital Stay (HOSPITAL_COMMUNITY): Payer: Medicare HMO

## 2020-10-07 ENCOUNTER — Encounter (HOSPITAL_COMMUNITY): Payer: Self-pay | Admitting: Emergency Medicine

## 2020-10-07 DIAGNOSIS — R7303 Prediabetes: Secondary | ICD-10-CM | POA: Diagnosis present

## 2020-10-07 DIAGNOSIS — I609 Nontraumatic subarachnoid hemorrhage, unspecified: Secondary | ICD-10-CM | POA: Diagnosis present

## 2020-10-07 DIAGNOSIS — R008 Other abnormalities of heart beat: Secondary | ICD-10-CM | POA: Diagnosis present

## 2020-10-07 DIAGNOSIS — I69390 Apraxia following cerebral infarction: Secondary | ICD-10-CM | POA: Diagnosis not present

## 2020-10-07 DIAGNOSIS — H539 Unspecified visual disturbance: Secondary | ICD-10-CM | POA: Diagnosis present

## 2020-10-07 DIAGNOSIS — R270 Ataxia, unspecified: Secondary | ICD-10-CM | POA: Diagnosis present

## 2020-10-07 DIAGNOSIS — E890 Postprocedural hypothyroidism: Secondary | ICD-10-CM | POA: Diagnosis present

## 2020-10-07 DIAGNOSIS — D72829 Elevated white blood cell count, unspecified: Secondary | ICD-10-CM | POA: Diagnosis not present

## 2020-10-07 DIAGNOSIS — I10 Essential (primary) hypertension: Secondary | ICD-10-CM | POA: Diagnosis not present

## 2020-10-07 DIAGNOSIS — R079 Chest pain, unspecified: Secondary | ICD-10-CM | POA: Diagnosis not present

## 2020-10-07 DIAGNOSIS — Z96652 Presence of left artificial knee joint: Secondary | ICD-10-CM | POA: Diagnosis present

## 2020-10-07 DIAGNOSIS — T447X6A Underdosing of beta-adrenoreceptor antagonists, initial encounter: Secondary | ICD-10-CM | POA: Diagnosis not present

## 2020-10-07 DIAGNOSIS — I5032 Chronic diastolic (congestive) heart failure: Secondary | ICD-10-CM | POA: Diagnosis present

## 2020-10-07 DIAGNOSIS — I63511 Cerebral infarction due to unspecified occlusion or stenosis of right middle cerebral artery: Secondary | ICD-10-CM | POA: Diagnosis not present

## 2020-10-07 DIAGNOSIS — Z833 Family history of diabetes mellitus: Secondary | ICD-10-CM

## 2020-10-07 DIAGNOSIS — I110 Hypertensive heart disease with heart failure: Secondary | ICD-10-CM | POA: Diagnosis present

## 2020-10-07 DIAGNOSIS — Z91138 Patient's unintentional underdosing of medication regimen for other reason: Secondary | ICD-10-CM

## 2020-10-07 DIAGNOSIS — I309 Acute pericarditis, unspecified: Secondary | ICD-10-CM | POA: Diagnosis present

## 2020-10-07 DIAGNOSIS — G441 Vascular headache, not elsewhere classified: Secondary | ICD-10-CM | POA: Diagnosis not present

## 2020-10-07 DIAGNOSIS — E039 Hypothyroidism, unspecified: Secondary | ICD-10-CM | POA: Diagnosis present

## 2020-10-07 DIAGNOSIS — G8194 Hemiplegia, unspecified affecting left nondominant side: Secondary | ICD-10-CM | POA: Diagnosis present

## 2020-10-07 DIAGNOSIS — R195 Other fecal abnormalities: Secondary | ICD-10-CM | POA: Diagnosis not present

## 2020-10-07 DIAGNOSIS — R29810 Facial weakness: Secondary | ICD-10-CM | POA: Diagnosis present

## 2020-10-07 DIAGNOSIS — Z79899 Other long term (current) drug therapy: Secondary | ICD-10-CM

## 2020-10-07 DIAGNOSIS — Z8249 Family history of ischemic heart disease and other diseases of the circulatory system: Secondary | ICD-10-CM

## 2020-10-07 DIAGNOSIS — F17210 Nicotine dependence, cigarettes, uncomplicated: Secondary | ICD-10-CM | POA: Diagnosis present

## 2020-10-07 DIAGNOSIS — R29708 NIHSS score 8: Secondary | ICD-10-CM | POA: Diagnosis present

## 2020-10-07 DIAGNOSIS — M549 Dorsalgia, unspecified: Secondary | ICD-10-CM | POA: Diagnosis present

## 2020-10-07 DIAGNOSIS — Z95810 Presence of automatic (implantable) cardiac defibrillator: Secondary | ICD-10-CM | POA: Diagnosis not present

## 2020-10-07 DIAGNOSIS — I639 Cerebral infarction, unspecified: Secondary | ICD-10-CM

## 2020-10-07 DIAGNOSIS — N3941 Urge incontinence: Secondary | ICD-10-CM | POA: Diagnosis present

## 2020-10-07 DIAGNOSIS — Z885 Allergy status to narcotic agent status: Secondary | ICD-10-CM

## 2020-10-07 DIAGNOSIS — E669 Obesity, unspecified: Secondary | ICD-10-CM | POA: Diagnosis present

## 2020-10-07 DIAGNOSIS — Z20822 Contact with and (suspected) exposure to covid-19: Secondary | ICD-10-CM | POA: Diagnosis present

## 2020-10-07 DIAGNOSIS — N179 Acute kidney failure, unspecified: Secondary | ICD-10-CM | POA: Diagnosis not present

## 2020-10-07 DIAGNOSIS — Z720 Tobacco use: Secondary | ICD-10-CM

## 2020-10-07 DIAGNOSIS — F411 Generalized anxiety disorder: Secondary | ICD-10-CM | POA: Diagnosis present

## 2020-10-07 DIAGNOSIS — Z6831 Body mass index (BMI) 31.0-31.9, adult: Secondary | ICD-10-CM | POA: Diagnosis not present

## 2020-10-07 DIAGNOSIS — Z6834 Body mass index (BMI) 34.0-34.9, adult: Secondary | ICD-10-CM | POA: Diagnosis not present

## 2020-10-07 DIAGNOSIS — R471 Dysarthria and anarthria: Secondary | ICD-10-CM | POA: Diagnosis present

## 2020-10-07 DIAGNOSIS — I428 Other cardiomyopathies: Secondary | ICD-10-CM

## 2020-10-07 DIAGNOSIS — Z8673 Personal history of transient ischemic attack (TIA), and cerebral infarction without residual deficits: Secondary | ICD-10-CM

## 2020-10-07 DIAGNOSIS — G894 Chronic pain syndrome: Secondary | ICD-10-CM | POA: Diagnosis present

## 2020-10-07 DIAGNOSIS — Z91040 Latex allergy status: Secondary | ICD-10-CM | POA: Diagnosis not present

## 2020-10-07 DIAGNOSIS — R1013 Epigastric pain: Secondary | ICD-10-CM | POA: Diagnosis not present

## 2020-10-07 DIAGNOSIS — J449 Chronic obstructive pulmonary disease, unspecified: Secondary | ICD-10-CM | POA: Diagnosis present

## 2020-10-07 DIAGNOSIS — I69398 Other sequelae of cerebral infarction: Secondary | ICD-10-CM | POA: Diagnosis not present

## 2020-10-07 DIAGNOSIS — I509 Heart failure, unspecified: Secondary | ICD-10-CM | POA: Diagnosis present

## 2020-10-07 DIAGNOSIS — I5022 Chronic systolic (congestive) heart failure: Secondary | ICD-10-CM | POA: Diagnosis not present

## 2020-10-07 DIAGNOSIS — I252 Old myocardial infarction: Secondary | ICD-10-CM

## 2020-10-07 DIAGNOSIS — R071 Chest pain on breathing: Secondary | ICD-10-CM | POA: Diagnosis present

## 2020-10-07 DIAGNOSIS — I63411 Cerebral infarction due to embolism of right middle cerebral artery: Secondary | ICD-10-CM | POA: Diagnosis present

## 2020-10-07 DIAGNOSIS — Z90710 Acquired absence of both cervix and uterus: Secondary | ICD-10-CM

## 2020-10-07 DIAGNOSIS — I6601 Occlusion and stenosis of right middle cerebral artery: Secondary | ICD-10-CM | POA: Diagnosis not present

## 2020-10-07 DIAGNOSIS — Z886 Allergy status to analgesic agent status: Secondary | ICD-10-CM | POA: Diagnosis not present

## 2020-10-07 DIAGNOSIS — Z7982 Long term (current) use of aspirin: Secondary | ICD-10-CM | POA: Diagnosis not present

## 2020-10-07 DIAGNOSIS — E785 Hyperlipidemia, unspecified: Secondary | ICD-10-CM | POA: Diagnosis present

## 2020-10-07 DIAGNOSIS — I69354 Hemiplegia and hemiparesis following cerebral infarction affecting left non-dominant side: Secondary | ICD-10-CM | POA: Diagnosis present

## 2020-10-07 HISTORY — PX: RADIOLOGY WITH ANESTHESIA: SHX6223

## 2020-10-07 HISTORY — PX: IR CT HEAD LTD: IMG2386

## 2020-10-07 HISTORY — PX: IR PERCUTANEOUS ART THROMBECTOMY/INFUSION INTRACRANIAL INC DIAG ANGIO: IMG6087

## 2020-10-07 LAB — CBG MONITORING, ED: Glucose-Capillary: 101 mg/dL — ABNORMAL HIGH (ref 70–99)

## 2020-10-07 LAB — COMPREHENSIVE METABOLIC PANEL
ALT: 17 U/L (ref 0–44)
AST: 21 U/L (ref 15–41)
Albumin: 3.8 g/dL (ref 3.5–5.0)
Alkaline Phosphatase: 62 U/L (ref 38–126)
Anion gap: 9 (ref 5–15)
BUN: 29 mg/dL — ABNORMAL HIGH (ref 8–23)
CO2: 22 mmol/L (ref 22–32)
Calcium: 9.3 mg/dL (ref 8.9–10.3)
Chloride: 107 mmol/L (ref 98–111)
Creatinine, Ser: 1.03 mg/dL — ABNORMAL HIGH (ref 0.44–1.00)
GFR, Estimated: >60 mL/min (ref >60)
Glucose, Bld: 109 mg/dL — ABNORMAL HIGH (ref 70–99)
Potassium: 4.4 mmol/L (ref 3.5–5.1)
Sodium: 138 mmol/L (ref 135–145)
Total Bilirubin: 0.7 mg/dL (ref 0.3–1.2)
Total Protein: 6.9 g/dL (ref 6.5–8.1)

## 2020-10-07 LAB — RAPID URINE DRUG SCREEN, HOSP PERFORMED
Amphetamines: NOT DETECTED
Barbiturates: NOT DETECTED
Benzodiazepines: NOT DETECTED
Cocaine: NOT DETECTED
Opiates: NOT DETECTED
Tetrahydrocannabinol: NOT DETECTED

## 2020-10-07 LAB — I-STAT CHEM 8, ED
BUN: 33 mg/dL — ABNORMAL HIGH (ref 8–23)
Calcium, Ion: 1.2 mmol/L (ref 1.15–1.40)
Chloride: 107 mmol/L (ref 98–111)
Creatinine, Ser: 1 mg/dL (ref 0.44–1.00)
Glucose, Bld: 101 mg/dL — ABNORMAL HIGH (ref 70–99)
HCT: 44 % (ref 36.0–46.0)
Hemoglobin: 15 g/dL (ref 12.0–15.0)
Potassium: 4.3 mmol/L (ref 3.5–5.1)
Sodium: 139 mmol/L (ref 135–145)
TCO2: 23 mmol/L (ref 22–32)

## 2020-10-07 LAB — CBC
HCT: 46.2 % — ABNORMAL HIGH (ref 36.0–46.0)
Hemoglobin: 14.6 g/dL (ref 12.0–15.0)
MCH: 28.8 pg (ref 26.0–34.0)
MCHC: 31.6 g/dL (ref 30.0–36.0)
MCV: 91.1 fL (ref 80.0–100.0)
Platelets: 288 10*3/uL (ref 150–400)
RBC: 5.07 MIL/uL (ref 3.87–5.11)
RDW: 14.8 % (ref 11.5–15.5)
WBC: 6.3 10*3/uL (ref 4.0–10.5)
nRBC: 0 % (ref 0.0–0.2)

## 2020-10-07 LAB — DIFFERENTIAL
Abs Immature Granulocytes: 0.01 10*3/uL (ref 0.00–0.07)
Basophils Absolute: 0 10*3/uL (ref 0.0–0.1)
Basophils Relative: 1 %
Eosinophils Absolute: 0.2 10*3/uL (ref 0.0–0.5)
Eosinophils Relative: 3 %
Immature Granulocytes: 0 %
Lymphocytes Relative: 27 %
Lymphs Abs: 1.7 10*3/uL (ref 0.7–4.0)
Monocytes Absolute: 0.6 10*3/uL (ref 0.1–1.0)
Monocytes Relative: 10 %
Neutro Abs: 3.7 10*3/uL (ref 1.7–7.7)
Neutrophils Relative %: 59 %

## 2020-10-07 LAB — TROPONIN I (HIGH SENSITIVITY): Troponin I (High Sensitivity): 24 ng/L — ABNORMAL HIGH (ref <18)

## 2020-10-07 LAB — SARS CORONAVIRUS 2 BY RT PCR (HOSPITAL ORDER, PERFORMED IN ~~LOC~~ HOSPITAL LAB): SARS Coronavirus 2: NEGATIVE

## 2020-10-07 LAB — URINALYSIS, ROUTINE W REFLEX MICROSCOPIC
Bilirubin Urine: NEGATIVE
Glucose, UA: NEGATIVE mg/dL
Ketones, ur: NEGATIVE mg/dL
Leukocytes,Ua: NEGATIVE
Nitrite: NEGATIVE
Protein, ur: NEGATIVE mg/dL
Specific Gravity, Urine: >1.046 — ABNORMAL HIGH (ref 1.005–1.030)
pH: 5 (ref 5.0–8.0)

## 2020-10-07 LAB — PROTIME-INR
INR: 1 (ref 0.8–1.2)
Prothrombin Time: 12.4 seconds (ref 11.4–15.2)

## 2020-10-07 LAB — APTT: aPTT: 27 seconds (ref 24–36)

## 2020-10-07 LAB — MRSA PCR SCREENING: MRSA by PCR: NEGATIVE

## 2020-10-07 SURGERY — IR WITH ANESTHESIA
Anesthesia: General

## 2020-10-07 MED ORDER — SODIUM CHLORIDE (PF) 0.9 % IJ SOLN
INTRAVENOUS | Status: AC | PRN
  Administered 2020-10-07: 25 ug via INTRA_ARTERIAL

## 2020-10-07 MED ORDER — FENTANYL CITRATE (PF) 250 MCG/5ML IJ SOLN
INTRAMUSCULAR | Status: DC | PRN
  Administered 2020-10-07 (×2): 50 ug via INTRAVENOUS

## 2020-10-07 MED ORDER — ACETAMINOPHEN 160 MG/5ML PO SOLN: 650.0000 mg | ORAL | Status: DC | PRN

## 2020-10-07 MED ORDER — FENTANYL CITRATE (PF) 100 MCG/2ML IJ SOLN
INTRAMUSCULAR | Status: AC
  Filled 2020-10-07: qty 2

## 2020-10-07 MED ORDER — ACETAMINOPHEN 10 MG/ML IV SOLN
1000.0000 mg | Freq: Once | INTRAVENOUS | Status: DC | PRN
  Administered 2020-10-07: 1000 mg via INTRAVENOUS
  Filled 2020-10-07: qty 100

## 2020-10-07 MED ORDER — SODIUM CHLORIDE 0.9 % IV SOLN: INTRAVENOUS | Status: DC

## 2020-10-07 MED ORDER — ROCURONIUM BROMIDE 10 MG/ML (PF) SYRINGE
PREFILLED_SYRINGE | INTRAVENOUS | Status: DC | PRN
  Administered 2020-10-07: 50 mg via INTRAVENOUS
  Administered 2020-10-07: 20 mg via INTRAVENOUS

## 2020-10-07 MED ORDER — FENTANYL CITRATE (PF) 100 MCG/2ML IJ SOLN: 25.0000 ug | INTRAMUSCULAR | Status: DC | PRN

## 2020-10-07 MED ORDER — TICAGRELOR 90 MG PO TABS
ORAL_TABLET | ORAL | Status: AC
  Filled 2020-10-07: qty 2

## 2020-10-07 MED ORDER — HYDRALAZINE HCL 20 MG/ML IJ SOLN
INTRAMUSCULAR | Status: DC | PRN
Start: 2020-10-07 — End: 2020-10-07
  Administered 2020-10-07: 5 mg via INTRAVENOUS

## 2020-10-07 MED ORDER — STROKE: EARLY STAGES OF RECOVERY BOOK
Freq: Once | Status: DC
  Filled 2020-10-07: qty 1

## 2020-10-07 MED ORDER — IOHEXOL 300 MG/ML  SOLN
150.0000 mL | Freq: Once | INTRAMUSCULAR | Status: AC | PRN
  Administered 2020-10-07: 80 mL

## 2020-10-07 MED ORDER — ACETAMINOPHEN 325 MG PO TABS
650.0000 mg | ORAL_TABLET | ORAL | Status: DC | PRN
  Administered 2020-10-07 – 2020-10-08 (×2): 650 mg via ORAL
  Filled 2020-10-07 (×2): qty 2

## 2020-10-07 MED ORDER — SUGAMMADEX SODIUM 200 MG/2ML IV SOLN
INTRAVENOUS | Status: DC | PRN
  Administered 2020-10-07: 200 mg via INTRAVENOUS

## 2020-10-07 MED ORDER — ACETAMINOPHEN 325 MG PO TABS
650.0000 mg | ORAL_TABLET | ORAL | Status: DC | PRN
  Filled 2020-10-07 (×2): qty 2

## 2020-10-07 MED ORDER — CANGRELOR TETRASODIUM 50 MG IV SOLR
INTRAVENOUS | Status: AC
  Filled 2020-10-07: qty 50

## 2020-10-07 MED ORDER — IOHEXOL 240 MG/ML SOLN
INTRAMUSCULAR | Status: AC
  Filled 2020-10-07: qty 200

## 2020-10-07 MED ORDER — ACETAMINOPHEN 650 MG RE SUPP: 650.0000 mg | RECTAL | Status: DC | PRN

## 2020-10-07 MED ORDER — LIDOCAINE 2% (20 MG/ML) 5 ML SYRINGE
INTRAMUSCULAR | Status: DC | PRN
  Administered 2020-10-07: 60 mg via INTRAVENOUS
  Administered 2020-10-07: 25 mg via INTRAVENOUS

## 2020-10-07 MED ORDER — KETOROLAC TROMETHAMINE 30 MG/ML IJ SOLN
INTRAMUSCULAR | Status: AC
  Filled 2020-10-07: qty 1

## 2020-10-07 MED ORDER — SENNOSIDES-DOCUSATE SODIUM 8.6-50 MG PO TABS: 1.0000 | ORAL_TABLET | Freq: Every evening | ORAL | Status: DC | PRN

## 2020-10-07 MED ORDER — PHENYLEPHRINE HCL-NACL 10-0.9 MG/250ML-% IV SOLN
INTRAVENOUS | Status: DC | PRN
  Administered 2020-10-07: 20 ug/min via INTRAVENOUS

## 2020-10-07 MED ORDER — CLEVIDIPINE BUTYRATE 0.5 MG/ML IV EMUL
0.0000 mg/h | INTRAVENOUS | Status: DC
  Administered 2020-10-07: 2 mg/h via INTRAVENOUS
  Filled 2020-10-07: qty 50

## 2020-10-07 MED ORDER — ACETAMINOPHEN 160 MG/5ML PO SOLN
650.0000 mg | ORAL | Status: DC | PRN
  Administered 2020-10-09 – 2020-10-10 (×3): 650 mg
  Filled 2020-10-07 (×3): qty 20.3

## 2020-10-07 MED ORDER — LABETALOL HCL 5 MG/ML IV SOLN
5.0000 mg | INTRAVENOUS | Status: DC | PRN
  Filled 2020-10-07: qty 4

## 2020-10-07 MED ORDER — CEFAZOLIN SODIUM-DEXTROSE 2-4 GM/100ML-% IV SOLN
INTRAVENOUS | Status: AC
  Filled 2020-10-07: qty 100

## 2020-10-07 MED ORDER — ASPIRIN 81 MG PO CHEW
CHEWABLE_TABLET | ORAL | Status: AC
  Filled 2020-10-07: qty 1

## 2020-10-07 MED ORDER — IOHEXOL 350 MG/ML SOLN
75.0000 mL | Freq: Once | INTRAVENOUS | Status: AC | PRN
  Administered 2020-10-07: 75 mL via INTRAVENOUS

## 2020-10-07 MED ORDER — EPTIFIBATIDE 20 MG/10ML IV SOLN
INTRAVENOUS | Status: AC
  Filled 2020-10-07: qty 10

## 2020-10-07 MED ORDER — TIROFIBAN HCL IN NACL 5-0.9 MG/100ML-% IV SOLN
INTRAVENOUS | Status: AC
  Filled 2020-10-07: qty 100

## 2020-10-07 MED ORDER — AMISULPRIDE (ANTIEMETIC) 5 MG/2ML IV SOLN
10.0000 mg | Freq: Once | INTRAVENOUS | Status: DC | PRN
  Filled 2020-10-07 (×2): qty 4

## 2020-10-07 MED ORDER — CEFAZOLIN SODIUM-DEXTROSE 2-3 GM-%(50ML) IV SOLR
INTRAVENOUS | Status: DC | PRN
  Administered 2020-10-07: 2 g via INTRAVENOUS

## 2020-10-07 MED ORDER — NITROGLYCERIN 1 MG/10 ML FOR IR/CATH LAB
INTRA_ARTERIAL | Status: AC
  Filled 2020-10-07: qty 10

## 2020-10-07 MED ORDER — ACETAMINOPHEN 10 MG/ML IV SOLN
INTRAVENOUS | Status: AC
  Filled 2020-10-07: qty 100

## 2020-10-07 MED ORDER — FENTANYL CITRATE (PF) 100 MCG/2ML IJ SOLN
25.0000 ug | INTRAMUSCULAR | Status: DC | PRN
  Administered 2020-10-07 (×4): 50 ug via INTRAVENOUS

## 2020-10-07 MED ORDER — CHLORHEXIDINE GLUCONATE CLOTH 2 % EX PADS
6.0000 | MEDICATED_PAD | Freq: Every day | CUTANEOUS | Status: DC
  Administered 2020-10-08 – 2020-10-13 (×6): 6 via TOPICAL

## 2020-10-07 MED ORDER — ONDANSETRON HCL 4 MG/2ML IJ SOLN
INTRAMUSCULAR | Status: DC | PRN
  Administered 2020-10-07: 4 mg via INTRAVENOUS

## 2020-10-07 MED ORDER — CLOPIDOGREL BISULFATE 300 MG PO TABS
ORAL_TABLET | ORAL | Status: AC
  Filled 2020-10-07: qty 1

## 2020-10-07 MED ORDER — CLEVIDIPINE BUTYRATE 0.5 MG/ML IV EMUL
INTRAVENOUS | Status: AC
  Filled 2020-10-07: qty 50

## 2020-10-07 MED ORDER — SUCCINYLCHOLINE CHLORIDE 200 MG/10ML IV SOSY
PREFILLED_SYRINGE | INTRAVENOUS | Status: DC | PRN
  Administered 2020-10-07: 100 mg via INTRAVENOUS

## 2020-10-07 MED ORDER — IOHEXOL 300 MG/ML  SOLN
50.0000 mL | Freq: Once | INTRAMUSCULAR | Status: AC | PRN
  Administered 2020-10-07: 30 mL

## 2020-10-07 MED ORDER — PROPOFOL 10 MG/ML IV BOLUS
INTRAVENOUS | Status: DC | PRN
  Administered 2020-10-07: 150 mg via INTRAVENOUS
  Administered 2020-10-07: 40 mg via INTRAVENOUS

## 2020-10-07 MED ORDER — HYDROMORPHONE HCL 1 MG/ML IJ SOLN
INTRAMUSCULAR | Status: AC
  Filled 2020-10-07: qty 1

## 2020-10-07 MED ORDER — KETOROLAC TROMETHAMINE 30 MG/ML IJ SOLN
30.0000 mg | Freq: Once | INTRAMUSCULAR | Status: AC
  Administered 2020-10-07: 30 mg via INTRAVENOUS

## 2020-10-07 MED ORDER — SODIUM CHLORIDE 0.9% FLUSH: 3.0000 mL | Freq: Once | INTRAVENOUS | Status: DC

## 2020-10-07 MED ORDER — VERAPAMIL HCL 2.5 MG/ML IV SOLN
INTRAVENOUS | Status: AC
  Filled 2020-10-07: qty 2

## 2020-10-07 MED ORDER — SODIUM CHLORIDE 0.9 % IV SOLN: INTRAVENOUS | Status: DC | PRN

## 2020-10-07 MED ORDER — DEXAMETHASONE SODIUM PHOSPHATE 10 MG/ML IJ SOLN
INTRAMUSCULAR | Status: DC | PRN
  Administered 2020-10-07: 4 mg via INTRAVENOUS

## 2020-10-07 NOTE — Anesthesia Postprocedure Evaluation (Signed)
Anesthesia Post Note  Patient: [name]  Procedure(s) Performed: IR WITH ANESTHESIA - CODE STROKE (N/A )     Patient location during evaluation: PACU Anesthesia Type: General Level of consciousness: sedated Pain management: pain level controlled Vital Signs Assessment: post-procedure vital signs reviewed and stable Respiratory status: spontaneous breathing and respiratory function stable Cardiovascular status: stable Postop Assessment: no apparent nausea or vomiting Anesthetic complications: no   No complications documented.  Last Vitals:  Vitals:   10/07/20 1455 10/07/20 1525  BP: 95/82 105/77  Pulse: 73 (!) 54  Resp: 13 13  SpO2: 99% 100%    Last Pain:  Vitals:   10/07/20 1525  PainSc: Asleep                 Vashawn Ekstein DANIEL

## 2020-10-07 NOTE — Procedures (Addendum)
S/P Rt Common carotid arteriogram followed by revascularization of inf division of RT MCA M2 seg with x 1 pass with 37 mm embotrap retriever and aspiration achieving a TICI 2C revascularization. Post CT focal subarachnoid hyperdenisity in the Rt ant sylvian region. No mass effect. Patient extubated. Pupils 2 mm RT = Lt floows simple commands appropriately. Able to raise Lt UE against gravity.  Able to bend Lt LE , RT groin soft.Distal pulses palpable. S.Cella Cappello MD

## 2020-10-07 NOTE — Transfer of Care (Signed)
Immediate Anesthesia Transfer of Care Note  Patient: Rebecca Singh  Procedure(s) Performed: IR WITH ANESTHESIA - CODE STROKE (N/A )  Patient Location: PACU  Anesthesia Type:General  Level of Consciousness: awake and patient cooperative  Airway & Oxygen Therapy: Patient Spontanous Breathing and Patient connected to face mask oxygen  Post-op Assessment: Report given to RN and Post -op Vital signs reviewed and stable  Post vital signs: Reviewed and stable  Last Vitals:  Vitals Value Taken Time  BP 129/85 10/07/20 1255  Temp    Pulse 50 10/07/20 1300  Resp 17 10/07/20 1300  SpO2 92 % 10/07/20 1300  Vitals shown include unvalidated device data.  Last Pain:  Vitals:   10/07/20 1240  PainSc: 8          Complications: No complications documented.

## 2020-10-07 NOTE — Sedation Documentation (Signed)
Received report from Elaine, RN

## 2020-10-07 NOTE — Anesthesia Procedure Notes (Signed)
Arterial Line Insertion Start/End1/25/2022 11:05 AM, 10/07/2020 11:06 AM Performed by: CRNA  Patient location: OOR procedure area. Preanesthetic checklist: patient identified, IV checked, site marked, risks and benefits discussed, surgical consent, monitors and equipment checked, pre-op evaluation, timeout performed and anesthesia consent Emergency situation Left, radial was placed Catheter size: 20 G Hand hygiene performed , maximum sterile barriers used  and Seldinger technique used Allen's test indicative of satisfactory collateral circulation Attempts: 1 Procedure performed without using ultrasound guided technique. Following insertion, dressing applied and Biopatch. Post procedure assessment: normal  Patient tolerated the procedure well with no immediate complications.

## 2020-10-07 NOTE — Code Documentation (Addendum)
Pt is a 65 yr old female in usual state of health when she had a sudden onset of left sided weakness and slurred speech today at 0830 while at work. Code stroke called at 0933. Pt arrived at 0950. Met at bridge by stroke team. Labs and CBG obtained. Airway cleared by EDP. Pt taken to CT at 0954. CT neg for hemorrhage per neurologist. CTA obtained. Per neurologist, CTA shows rt M2 occlusion.TPA not given as stroke appears older than her onset of symptoms suggest, also, pt has had a recent procedure involving her implanted defibrillator/pacemaker. MRS 0. NIHSS 8, no CTP as LKW < 6 hr, so Code IR paged out at 1038. Pt taken to IR bay 8 at 1040. Consent obtained by Dr Curly [place]. Pt induced by anesthesia,  pulses marked and groin prepped. Report given to [facility]. Pt brought to IR suite at 1059. See flowsheet for additional times and full NIH score.

## 2020-10-07 NOTE — ED Provider Notes (Signed)
[facility] EMERGENCY DEPARTMENT Provider Note   CSN: [id number] Arrival date & time: 10/07/20  3532     History No chief complaint on file.   Rebecca Singh is a 65 y.o. female.  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  Patient was last seen normal at 830 by coworkers and she developed left-sided weakness and slurred speech.  Upon arrival here patient still with some slurred speech and weakness in her left arm and reports it is difficult to move her left leg because of a knee surgery 3 months ago.  Patient denies any chest pain, shortness of breath.  Blood sugar was normal.  PAramedics report that O2 saturation was within normal limits.  Patient continues to smoke daily, does not use drugs or alcohol and takes an 81 mg aspirin but no other anticoagulation.    The history is provided by the patient and the EMS personnel.       Past Medical History:  Diagnosis Date  . AICD (automatic cardioverter/defibrillator) present   . COPD (chronic obstructive pulmonary disease) (HCC)   . Hypothyroidism   . Myocardial infarction (HCC)   . Pacemaker/defibrillator   . Stroke [facility])     Patient Active Problem List   Diagnosis Date Noted  . Left sided chest pain 11/16/2017    Past Surgical History:  Procedure Laterality Date  . ABDOMINAL HYSTERECTOMY    . CHOLECYSTECTOMY    . INSERT / REPLACE / REMOVE PACEMAKER    . THYROIDECTOMY    . TONSILLECTOMY       OB History   No obstetric history on file.     No family history on file.  Social History   Tobacco Use  . Smoking status: Current Every Day Smoker    Packs/day: 1.00    Years: 15.00    Pack years: 15.00    Types: Cigarettes  . Smokeless tobacco: Never Used  Vaping Use  . Vaping Use: Never used  Substance Use Topics  . Alcohol use: No  . Drug use: No    Home Medications Prior to Admission  medications   Medication Sig Start Date End Date Taking? Authorizing Provider  aspirin EC 81 MG EC tablet Take 1 tablet (81 mg total) by mouth daily. 11/17/17   Adrian Saran, MD  cyclobenzaprine (FLEXERIL) 10 MG tablet Take 1 tablet (10 mg total) by mouth 3 (three) times daily as needed. 12/10/18   Fisher, Roselyn Bering, PA-C  metoprolol tartrate (LOPRESSOR) 25 MG tablet Take 1 tablet (25 mg total) by mouth 2 (two) times daily. 11/17/17   Adrian Saran, MD  nicotine (NICODERM CQ - DOSED IN MG/24 HOURS) 21 mg/24hr patch Place 1 patch (21 mg total) onto the skin daily. 11/17/17   Adrian Saran, MD  ondansetron (ZOFRAN-ODT) 4 MG disintegrating tablet Take 1 tablet (4 mg total) by mouth every 8 (eight) hours as needed. 12/10/18   Fisher, Roselyn Bering, PA-C  oxyCODONE-acetaminophen (PERCOCET/ROXICET) 5-325 MG tablet Take 1-2 tablets by mouth every 6 (six) hours as needed for severe pain. 12/10/18   Fisher, Roselyn Bering, PA-C  predniSONE (STERAPRED UNI-PAK 21 TAB) 10 MG (21) TBPK tablet Take 6 pills on day one then decrease by 1 pill each day 12/10/18   Faythe Ghee, PA-C    Allergies    Codeine and Other  Review of Systems   Review of Systems  All  other systems reviewed and are negative.   Physical Exam Updated Vital Signs There were no vitals taken for this visit.  Physical Exam Vitals and nursing note reviewed.  Constitutional:      General: She is not in acute distress.    Appearance: Normal appearance. She is well-developed, normal weight and well-nourished.  HENT:     Head: Normocephalic and atraumatic.  Eyes:     Extraocular Movements: EOM normal.     Pupils: Pupils are equal, round, and reactive to light.  Cardiovascular:     Rate and Rhythm: Normal rate and regular rhythm.     Pulses: Intact distal pulses.     Heart sounds: Normal heart sounds. No murmur heard. No friction rub.  Pulmonary:     Effort: Pulmonary effort is normal.     Breath sounds: Normal breath sounds. No wheezing or rales.   Abdominal:     General: Bowel sounds are normal. There is no distension.     Palpations: Abdomen is soft.     Tenderness: There is no abdominal tenderness. There is no guarding or rebound.  Musculoskeletal:        General: No tenderness. Normal range of motion.     Comments: No edema  Skin:    General: Skin is warm and dry.     Findings: No rash.  Neurological:     Mental Status: She is alert and oriented to person, place, and time.     Cranial Nerves: No cranial nerve deficit.     Comments: Slurred speech.  4/5 strength in left upper ext.  Limited movement of the left lower ext due to pain from recent knee replacement.  Pronator drift in the left upper ext.    Psychiatric:        Mood and Affect: Mood and affect normal.        Behavior: Behavior normal.     Comments: Appears slightly anxious     ED Results / Procedures / Treatments   Labs (all labs ordered are listed, but only abnormal results are displayed) Labs Reviewed  CBC - Abnormal; Notable for the following components:      Result Value   HCT 46.2 (*)    All other components within normal limits  I-STAT CHEM 8, ED - Abnormal; Notable for the following components:   BUN 33 (*)    Glucose, Bld 101 (*)    All other components within normal limits  CBG MONITORING, ED - Abnormal; Notable for the following components:   Glucose-Capillary 101 (*)    All other components within normal limits  PROTIME-INR  APTT  DIFFERENTIAL  COMPREHENSIVE METABOLIC PANEL    EKG None  Radiology CT Code Stroke CTA Head W/WO contrast  Result Date: 10/07/2020 CLINICAL DATA:  Neuro deficit, acute, stroke suspected. EXAM: CT ANGIOGRAPHY HEAD AND NECK TECHNIQUE: Multidetector CT imaging of the head and neck was performed using the standard protocol during bolus administration of intravenous contrast. Multiplanar CT image reconstructions and MIPs were obtained to evaluate the vascular anatomy. Carotid stenosis measurements (when applicable)  are obtained utilizing NASCET criteria, using the distal internal carotid diameter as the denominator. CONTRAST:  51mL OMNIPAQUE IOHEXOL 350 MG/ML SOLN COMPARISON:  Head CT October 07, 2020. FINDINGS: CTA NECK FINDINGS Aortic arch: Standard branching. Imaged portion shows no evidence of aneurysm or dissection. Mild atherosclerotic changes of the aortic arch without stenosis at the branch origins. Increased tortuosity of the major arch vessels likely related to longstanding hypertension. Right  carotid system: Prominent tortuosity of the right common and cervical segment of right internal carotid artery. No evidence of dissection, stenosis (50% or greater) or occlusion. Left carotid system: Increased tortuosity of the proximal left common carotid artery and upper cervical segment of the left internal carotid artery. No evidence of dissection, stenosis (50% or greater) or occlusion. Vertebral arteries: Left dominant. V1 segment of the right vertebral artery is partially obscured by streak artifact. No evidence of dissection, stenosis (50% or greater) or occlusion. Skeleton: No acute findings. Other neck: Ectopic thyroid tissue in the midline with small right and left lobes. Paratracheal lymph nodes below the thyroid gland are unchanged from prior chest CT performed 2019. Upper chest: No acute findings. Review of the MIP images confirms the above findings CTA HEAD FINDINGS Anterior circulation: Small calcified plaques in the bilateral carotid siphons without hemodynamically significant stenosis. There is a right M2/MCA posterior division branch occlusion with moderate distal branch opacification via leptomeningeal collaterals. Left MCA vascular tree in bilateral ACA vascular trees show no evidence of significant stenosis or proximal occlusion. Posterior circulation: No significant stenosis, proximal occlusion, aneurysm, or vascular malformation. Venous sinuses: As permitted by contrast timing, patent. Review of the MIP  images confirms the above findings IMPRESSION: 1. Right M2/MCA posterior division branch occlusion with moderate distal branch opacification via leptomeningeal collaterals. 2. No hemodynamically significant stenosis in the neck. These results were called by telephone at the time of interpretation on 10/07/2020 at 10:26 am to [facility] , who verbally acknowledged these results. Aortic Atherosclerosis (ICD10-I70.0). Electronically Signed   By: Baldemar LenisKatyucia  De Macedo Rodrigues M.D.   On: 10/07/2020 10:50   CT Code Stroke CTA Neck W/WO contrast  Result Date: 10/07/2020 CLINICAL DATA:  Neuro deficit, acute, stroke suspected. EXAM: CT ANGIOGRAPHY HEAD AND NECK TECHNIQUE: Multidetector CT imaging of the head and neck was performed using the standard protocol during bolus administration of intravenous contrast. Multiplanar CT image reconstructions and MIPs were obtained to evaluate the vascular anatomy. Carotid stenosis measurements (when applicable) are obtained utilizing NASCET criteria, using the distal internal carotid diameter as the denominator. CONTRAST:  75mL OMNIPAQUE IOHEXOL 350 MG/ML SOLN COMPARISON:  Head CT October 07, 2020. FINDINGS: CTA NECK FINDINGS Aortic arch: Standard branching. Imaged portion shows no evidence of aneurysm or dissection. Mild atherosclerotic changes of the aortic arch without stenosis at the branch origins. Increased tortuosity of the major arch vessels likely related to longstanding hypertension. Right carotid system: Prominent tortuosity of the right common and cervical segment of right internal carotid artery. No evidence of dissection, stenosis (50% or greater) or occlusion. Left carotid system: Increased tortuosity of the proximal left common carotid artery and upper cervical segment of the left internal carotid artery. No evidence of dissection, stenosis (50% or greater) or occlusion. Vertebral arteries: Left dominant. V1 segment of the right vertebral artery is  partially obscured by streak artifact. No evidence of dissection, stenosis (50% or greater) or occlusion. Skeleton: No acute findings. Other neck: Ectopic thyroid tissue in the midline with small right and left lobes. Paratracheal lymph nodes below the thyroid gland are unchanged from prior chest CT performed 2019. Upper chest: No acute findings. Review of the MIP images confirms the above findings CTA HEAD FINDINGS Anterior circulation: Small calcified plaques in the bilateral carotid siphons without hemodynamically significant stenosis. There is a right M2/MCA posterior division branch occlusion with moderate distal branch opacification via leptomeningeal collaterals. Left MCA vascular tree in bilateral ACA vascular trees show no evidence of  significant stenosis or proximal occlusion. Posterior circulation: No significant stenosis, proximal occlusion, aneurysm, or vascular malformation. Venous sinuses: As permitted by contrast timing, patent. Review of the MIP images confirms the above findings IMPRESSION: 1. Right M2/MCA posterior division branch occlusion with moderate distal branch opacification via leptomeningeal collaterals. 2. No hemodynamically significant stenosis in the neck. These results were called by telephone at the time of interpretation on 10/07/2020 at 10:26 am to provider [facility] , who verbally acknowledged these results. Aortic Atherosclerosis (ICD10-I70.0). Electronically Signed   By: Baldemar Lenis M.D.   On: 10/07/2020 10:50   CT HEAD CODE STROKE WO CONTRAST  Result Date: 10/07/2020 CLINICAL DATA:  Code stroke.    Left-sided weakness.  Slurred speech EXAM: CT HEAD WITHOUT CONTRAST TECHNIQUE: Contiguous axial images were obtained from the base of the skull through the vertex without intravenous contrast. COMPARISON:  None. FINDINGS: Brain: Small hypodensity right frontal parietal lobe near the motor cortex. Probable acute infarct. Negative for hemorrhage Ventricle size  normal.  Negative for mass lesion. Vascular: Suspect hyperdense vessel right M2 /M3 region in the right sylvian fissure. Skull: Negative Sinuses/Orbits: Paranasal sinuses clear.  Negative orbit Other: None ASPECTS (Alberta Stroke Program Early CT Score) - Ganglionic level infarction (caudate, lentiform nuclei, internal capsule, insula, M1-M3 cortex): 6 - Supraganglionic infarction (M4-M6 cortex): 3 Total score (0-10 with 10 being normal): 9 IMPRESSION: 1. Hypodensity right frontal parietal lobe suspicious for acute infarct given the symptoms. In addition, there is a probable small hyperdense vessel in the right sylvian fissure involving the right middle cerebral artery. 2. ASPECTS is 9 3. These results were called by telephone at the time of interpretation on 10/07/2020 at 10:12 am to provider [facility] , who verbally acknowledged these results. 4. Code stroke imaging results were communicated on 10/07/2020 at 10:12 am to provider Bhagat via text page Electronically Signed   By: Marlan Palau M.D.   On: 10/07/2020 10:13    Procedures Procedures   Medications Ordered in ED Medications  sodium chloride flush (NS) 0.9 % injection 3 mL (has no administration in time range)    ED Course  I have reviewed the triage vital signs and the nursing notes.  Pertinent labs & imaging results that were available during my care of the patient were reviewed by me and considered in my medical decision making (see chart for details).    MDM Rules/Calculators/A&P                          Patient presenting today as a code stroke.  She was last seen normal at 830 by coworkers and developed a left-sided weakness and slurred speech.  She has multiple stroke risk factors and prior stroke.  She takes 81 mg aspirin but no other anticoagulation.  Blood sugar was within normal limits.  She denied any recent infectious symptoms.  Code stroke labs initiated.  Patient went directly to the scanner as her airway was intact.   Stroke team present upon patient's arrival.  10:24 AM I-STAT without acute findings, CBG 101, coags within normal limits, CBC within normal limits, head CT shows a hypodense right frontal parietal lobe lesion suspicious for acute infarct as well as probable small hyperdense vessel in the right sylvian fissure involving the right middle cerebral artery.  CTA of the head and neck were done and patient sent directly to MRI.  BP remains stable.  CTA also showed occlusion and pt went  to IR.  Pt admitted to neurology service.  MDM Number of Diagnoses or Management Options   Amount and/or Complexity of Data Reviewed Clinical lab tests: ordered and reviewed Tests in the radiology section of CPT: ordered and reviewed Decide to obtain previous medical records or to obtain history from someone other than the patient: yes Obtain history from someone other than the patient: yes Review and summarize past medical records: yes Discuss the patient with other providers: yes Independent visualization of images, tracings, or specimens: yes  Risk of Complications, Morbidity, and/or Mortality Presenting problems: high Diagnostic procedures: high Management options: high   CRITICAL CARE Performed by: Emi Lymon Total critical care time: 30 minutes Critical care time was exclusive of separately billable procedures and treating other patients. Critical care was necessary to treat or prevent imminent or life-threatening deterioration. Critical care was time spent personally by me on the following activities: development of treatment plan with patient and/or surrogate as well as nursing, discussions with consultants, evaluation of patient's response to treatment, examination of patient, obtaining history from patient or surrogate, ordering and performing treatments and interventions, ordering and review of laboratory studies, ordering and review of radiographic studies, pulse oximetry and re-evaluation of  patient's condition.   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

## 2020-10-07 NOTE — Progress Notes (Signed)
Patient ID: Rebecca Singh, female   DOB: 10-18-1955, 65 y.o.   MRN: [id number] INR. 64 Y RT F MRS 0 LSW 830 am . New onset Lt sided weakness lt facial droop. CT NO ICH old RT  parietal hypo densities subacute /old ischemia. ASPECTS.9.  CTA occluded inf division Rt MCA M2 seg. Endovascular treatment D/W sister in a 3 way call.Procedure,reasons,alternatives discussed. Risks of ICH of 10 %, worsening neuro deficit inability to revascularize reviewed. Sister expressed understanding and provided  informed witnessed consent for the treatment. S>Asaiah Hunnicutt MD

## 2020-10-07 NOTE — Sedation Documentation (Signed)
Report given to PACU RN, Herbert Seta, at bedside with CRNA. Groin level 0, unremarkable.

## 2020-10-07 NOTE — Anesthesia Procedure Notes (Signed)
Procedure Name: Intubation Date/Time: 10/07/2020 10:55 AM Performed by: Adria Dill, CRNA Pre-anesthesia Checklist: Patient identified, Emergency Drugs available, Suction available and Patient being monitored Patient Re-evaluated:Patient Re-evaluated prior to induction Oxygen Delivery Method: Circle system utilized Preoxygenation: Pre-oxygenation with 100% oxygen Induction Type: IV induction and Rapid sequence Laryngoscope Size: Glidescope and 4 Grade View: Grade I Tube type: Oral Tube size: 7.0 mm Number of attempts: 1 Airway Equipment and Method: Stylet and Oral airway Placement Confirmation: ETT inserted through vocal cords under direct vision,  positive ETCO2 and breath sounds checked- equal and bilateral Secured at: 21 cm Tube secured with: Tape Dental Injury: Teeth and Oropharynx as per pre-operative assessment

## 2020-10-07 NOTE — Anesthesia Preprocedure Evaluation (Signed)
Anesthesia Evaluation  Patient identified by MRN, date of birth, ID band Patient awake    Reviewed: Allergy & Precautions, NPO status , Patient's Chart, lab work & pertinent test resultsPreop documentation limited or incomplete due to emergent nature of procedure.  Airway Mallampati: II  TM Distance: >3 FB     Dental  (+) Dental Advisory Given   Pulmonary COPD, Current Smoker,    breath sounds clear to auscultation       Cardiovascular hypertension, Pt. on home beta blockers + Past MI and +CHF  + pacemaker + Cardiac Defibrillator  Rhythm:Regular Rate:Normal     Neuro/Psych CVA    GI/Hepatic negative GI ROS, Neg liver ROS,   Endo/Other  Hypothyroidism   Renal/GU negative Renal ROS     Musculoskeletal   Abdominal   Peds  Hematology negative hematology ROS (+)   Anesthesia Other Findings   Reproductive/Obstetrics                             Anesthesia Physical Anesthesia Plan  ASA: IV and emergent  Anesthesia Plan: General   Post-op Pain Management:    Induction: Intravenous  PONV Risk Score and Plan: 2 and Dexamethasone, Ondansetron and Treatment may vary due to age or medical condition  Airway Management Planned: Oral ETT  Additional Equipment: Arterial line  Intra-op Plan:   Post-operative Plan: Extubation in OR  Informed Consent: I have reviewed the patients History and Physical, chart, labs and discussed the procedure including the risks, benefits and alternatives for the proposed anesthesia with the patient or authorized representative who has indicated his/her understanding and acceptance.     Dental advisory given  Plan Discussed with: CRNA  Anesthesia Plan Comments:         Anesthesia Quick Evaluation

## 2020-10-07 NOTE — Progress Notes (Signed)
Pt intubated and under the care of anesthesia °

## 2020-10-07 NOTE — Progress Notes (Signed)
Brief cardiology note: Contacted by Dr. Iver Nestle with neurology. Patient presenting with acute stroke, asking about tPA given recent EP procedure at [facility].  Per Care Everywhere: POST-PROCEDURE PROGRESS NOTE  Admit Date: 09/25/2020   Attending: Joretta Bachelor, MD  Admitting Service: Cards Procedures (MDS)  Patient seen and evaluated post procedure. I agree with H&P as recorded.  Pectoral incision site with dressing clean, dry and intact. No bleeding or hematoma noted.  Procedure: 09/25/2020 S/P generator exchange of a Field seismologist pacemaker See EP note for details.   ASSESSMENT AND PLAN  Admitting Diagnosis: Biventricular implantable cardioverter-defibrillator in situ [Z95.810] Heart failure with preserved ejection fraction, unspecified HF chronicity (CMS-HCC) [I50.30]  PR REMVL PERM PM PLS GEN W/REPL PLSE GEN 2 LEAD SYS [33228] (Remove & Replace Dual Pacemaker Generator)  Based on notes, appears this was a generator changeout, without placement or removal of intracardiac leads. Given the life threatening nature of acute stroke, there is no absolute contraindication for tPA. Given recent procedure, there is an increased risk of intrapocket hematoma after administration of tPA. This will need to be monitored. Will reach out to EP team to see if there are any additional recommendations.  Jodelle Red, MD, PhD, [facility]  [facility]  [address] [place], [place] [address] [phone]

## 2020-10-07 NOTE — Consult Note (Addendum)
Neurology Consultation Reason for Consult: Code stroke Requesting Physician: Gwyneth Sprout  CC:   History is obtained from:  HPI: Rebecca Singh is a 65 y.o. 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  She has been in her usual state of health recently.  She had a chamber replacement for her pacemaker on 09/25/2020 which was uncomplicated.  She also reports she had a recent left knee replacement about 3 months ago (though on chart review this was scheduled for 05/07/2020).  She was at work this morning when she suddenly started to feel unwell, with a severe headache.  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  She had some left-sided weakness, left facial droop, was drooling on the left side.  EMS was activated and noted her blood pressures in the 190s, blood glucose in the 100s, and a code stroke was activated.  EMS reports he is generally noncompliant with any medications and is not taking any medications despite her significant cardiac history, and she generally only goes to her cardiology appointments but not other doctors appointments typically.  In arrival to the ED, decision to tPA was complicated by the fact that she had had this recent procedure.  Cardiology confirmed that there would be some hemorrhage risk in the pocket but that this could be managed adequately with close monitoring.  However head CT revealed a potential acute infarct in the right parietal region, in a region that is often asymptomatic and therefore is difficult to date.  However she also had a hyperdense vessel sign in the right MCA, with CT angio confirming an acute right M2 occlusion.  Her son could not be  reached for consent, and therefore her sister was consented for intra-arterial thrombectomy.  For preoperative clearance note from 05/05/2020, she has not been vaccinated against COVID-19 at that time, nor has she received her zoster vaccine LKW: 8:30 AM tPA given?: No, due to acute infarct on head CT IA performed?:  Yes Premorbid modified rankin scale:      0 - No symptoms.  ROS: A 14 point ROS was performed and is negative except as noted in the HPI.   Past Medical History:  Diagnosis Date  . AICD (automatic cardioverter/defibrillator) present   . COPD (chronic obstructive pulmonary disease) (HCC)   . Hypothyroidism   . Myocardial infarction (HCC)   . Pacemaker/defibrillator   . Stroke [facility])    Past Surgical History:  Procedure Laterality Date  . ABDOMINAL HYSTERECTOMY    . CHOLECYSTECTOMY    . INSERT / REPLACE / REMOVE PACEMAKER    . THYROIDECTOMY    . TONSILLECTOMY      Family history Per 01/17/2019 office note, needs to be verified . Diabetes Mother  . Hypertension Mother  . Heart disease Mother  . Depression Mother  . Diabetes Father  . Heart disease Father  . Hypertension Father  . Depression Brother  . Heart attack Neg Hx   Social History:  reports that she has been smoking cigarettes. She has a 15.00 pack-year smoking history. She has never used smokeless tobacco. She reports that she does not drink alcohol and does not use drugs.  However in some other outside records there is a note of polysubstance  abuse not further specified   Exam: Current vital signs: Wt 96.8 kg   BMI 34.44 kg/m  Vital signs in last 24 hours: Weight:  [96.8 kg] 96.8 kg (01/25 1100) Heart rate paced in the 60s, blood pressures initially in the 190s then trending down to the 130s  Physical Exam  Constitutional: Appears well-developed and well-nourished.  Psych: Affect appropriate to situation, slightly anxious Eyes: No scleral injection HENT: No OP obstruction, some drooling from the  left side of her face MSK: no joint deformities.  Cardiovascular: Normal rate and regular rhythm.  Respiratory: Effort normal, non-labored breathing GI: Soft.  No distension. There is no tenderness.  Skin: Visible skin intact, recent incision not examined  Neuro: Mental Status: Patient is awake, alert, oriented to person, place, month, year, and situation. Patient is able to give a clear and coherent history. No signs of aphasia or neglect Cranial Nerves: II: Visual Fields are full though she possibly has some neglect towards the left side she does not report stimuli in the left as readily. Pupils are equal, round, and reactive to light.   III,IV, VI: EOMI without ptosis or diploplia.  V: Facial sensation is reduced on the left side of the face  VII: Facial movement is notable for a left moderate facial droop  VIII: hearing is intact to voice X: Uvula difficult to visualize  XII: tongue is midline without atrophy or fasciculations.  Motor: Tone is normal. Bulk is normal.  She has pronator drift of the left upper extremity and drift of the left lower extremity Sensory: Sensation is equal to light touch on the right and the left, but she extinguishes on the left to double simultaneous stimuli Cerebellar: FNF and HKS are intact bilaterally  NIHSS total 8 Score breakdown:  2 points for facial droop, 1 point for left upper extremity drift, 1 point for left lower extremity drift, 1 point for left upper extremity ataxia, 1 point for sensation loss of the left face, 1 point for dysarthria, 1 point for extinction/inattention to left-sided stimuli    I have reviewed labs in epic and the results pertinent to this consultation are: Creatinine of 1.0, BUN of 33, CBC within normal limits other than mildly elevated hematocrit at 46.2 Glucose 109  I have reviewed the images obtained: Head CT with right parietal hypodensity concerning for acute stroke and right M2 hyperdense vessel CTA with  right M2 cutoff  Impression: This is a 65 year old woman with cardiovascular risk factors as above, presenting with acute right MCA stroke, likely cardioembolic in the setting of her significant cardiac history.  Given the location of the hypodensity on head CT, I am concerned that she may have had a asymptomatic stroke in the last day or 2 with no ability to age the infarct given her recent pacemaker procedure is a contraindication to MRI.  Therefore we did not proceed with tPA but as she was an IA candidate she was taken for thrombectomy  Recommendations: # Acute ischemic right MCA stroke, atheroembolic versus cardioembolic - Stroke labs TSH, HgbA1c, fasting lipid panel - MRI contraindicated due to recent pacemaker carried replacement per MRI technicians need a minimum of 6 weeks from procedure - Frequent neuro checks - Echocardiogram - Prophylactic therapy-Antiplatelet med: Aspirin - dose 325mg  PO or 300mg  PR, followed by 81 mg daily to be given if there is no significant hemorrhage during the IA procedure - Consider Plavix 300 mg load with 75 mg daily for 21 - 90 day course if  no indication for anticoagulation found and no alternative antiplatelet plan needed per neuro interventional radiology - Risk factor modification - Telemetry monitoring; 30 day event monitor on discharge if no arrythmias captured  - Blood pressure goal   - Post successful uncomplicated revascularization SBP 120 - 140 for 24 hours; if complications have arisen or only partial revascularization reach out to interventionalist or neurologist on call for BP goal - PT consult, OT consult, Speech consult, unless patient is back to baseline - Stroke team to follow  Brooke Dare MD-PhD [facility] [phone]  75 minutes of critical care provided for this patient

## 2020-10-08 ENCOUNTER — Other Ambulatory Visit (HOSPITAL_COMMUNITY): Payer: Medicare HMO

## 2020-10-08 ENCOUNTER — Encounter (HOSPITAL_COMMUNITY): Payer: Self-pay | Admitting: Radiology

## 2020-10-08 DIAGNOSIS — I6601 Occlusion and stenosis of right middle cerebral artery: Secondary | ICD-10-CM

## 2020-10-08 LAB — BASIC METABOLIC PANEL
Anion gap: 9 (ref 5–15)
BUN: 22 mg/dL (ref 8–23)
CO2: 19 mmol/L — ABNORMAL LOW (ref 22–32)
Calcium: 8.7 mg/dL — ABNORMAL LOW (ref 8.9–10.3)
Chloride: 109 mmol/L (ref 98–111)
Creatinine, Ser: 0.84 mg/dL (ref 0.44–1.00)
GFR, Estimated: >60 mL/min (ref >60)
Glucose, Bld: 105 mg/dL — ABNORMAL HIGH (ref 70–99)
Potassium: 4 mmol/L (ref 3.5–5.1)
Sodium: 137 mmol/L (ref 135–145)

## 2020-10-08 LAB — CBC WITH DIFFERENTIAL/PLATELET
Abs Immature Granulocytes: 0.03 10*3/uL (ref 0.00–0.07)
Basophils Absolute: 0 10*3/uL (ref 0.0–0.1)
Basophils Relative: 0 %
Eosinophils Absolute: 0 10*3/uL (ref 0.0–0.5)
Eosinophils Relative: 0 %
HCT: 39.6 % (ref 36.0–46.0)
Hemoglobin: 12.6 g/dL (ref 12.0–15.0)
Immature Granulocytes: 0 %
Lymphocytes Relative: 13 %
Lymphs Abs: 1.1 10*3/uL (ref 0.7–4.0)
MCH: 28.6 pg (ref 26.0–34.0)
MCHC: 31.8 g/dL (ref 30.0–36.0)
MCV: 90 fL (ref 80.0–100.0)
Monocytes Absolute: 0.4 10*3/uL (ref 0.1–1.0)
Monocytes Relative: 4 %
Neutro Abs: 7 10*3/uL (ref 1.7–7.7)
Neutrophils Relative %: 83 %
Platelets: 265 10*3/uL (ref 150–400)
RBC: 4.4 MIL/uL (ref 3.87–5.11)
RDW: 14.6 % (ref 11.5–15.5)
WBC: 8.5 10*3/uL (ref 4.0–10.5)
nRBC: 0 % (ref 0.0–0.2)

## 2020-10-08 LAB — HIV ANTIBODY (ROUTINE TESTING W REFLEX): HIV Screen 4th Generation wRfx: NONREACTIVE

## 2020-10-08 LAB — HEMOGLOBIN A1C
Hgb A1c MFr Bld: 6.1 % — ABNORMAL HIGH (ref 4.8–5.6)
Mean Plasma Glucose: 128.37 mg/dL

## 2020-10-08 LAB — LIPID PANEL
Cholesterol: 124 mg/dL (ref 0–200)
HDL: 49 mg/dL (ref >40)
LDL Cholesterol: 65 mg/dL (ref 0–99)
Total CHOL/HDL Ratio: 2.5 RATIO
Triglycerides: 52 mg/dL (ref <150)
VLDL: 10 mg/dL (ref 0–40)

## 2020-10-08 LAB — TSH: TSH: 2.368 u[IU]/mL (ref 0.350–4.500)

## 2020-10-08 MED ORDER — BUTALBITAL-APAP-CAFFEINE 50-325-40 MG PO TABS
1.0000 | ORAL_TABLET | Freq: Four times a day (QID) | ORAL | Status: DC | PRN
  Administered 2020-10-08 – 2020-10-11 (×7): 1 via ORAL
  Filled 2020-10-08 (×9): qty 1

## 2020-10-08 MED ORDER — LABETALOL HCL 5 MG/ML IV SOLN: 5.0000 mg | INTRAVENOUS | Status: DC | PRN

## 2020-10-08 MED ORDER — NICOTINE 21 MG/24HR TD PT24
21.0000 mg | MEDICATED_PATCH | Freq: Every day | TRANSDERMAL | Status: DC
  Administered 2020-10-08 – 2020-10-13 (×6): 21 mg via TRANSDERMAL
  Filled 2020-10-08 (×6): qty 1

## 2020-10-08 NOTE — Discharge Instructions (Addendum)
Femoral Site Care This sheet gives you information about how to care for yourself after your procedure. Your health care provider may also give you more specific instructions. If you have problems or questions, contact your health care provider. What can I expect after the procedure? After the procedure, it is common to have:  Bruising that usually fades within 1-2 weeks.  Tenderness at the site. Follow these instructions at home: Wound care 1. Follow instructions from your health care provider about how to take care of your insertion site. Make sure you: ? Wash your hands with soap and water before you change your bandage (dressing). If soap and water are not available, use hand sanitizer. ? Change your dressing as directed- pressure dressing removed 24 hours post-procedure (and switch for bandaid), bandaid removed 72 hours post-procedure 2. Do not take baths, swim, or use a hot tub for 7 days post-procedure. 3. You may shower 48 hours after the procedure or as told by your health care provider. ? Gently wash the site with plain soap and water. ? Pat the area dry with a clean towel. ? Do not rub the site. This may cause bleeding. 4. Check your site every day for signs of infection. Check for: ? Redness, swelling, or pain. ? Fluid or blood. ? Warmth. ? Pus or a bad smell. Activity  Do not stoop, bend, or lift anything that is heavier than 10 lb (4.5 kg) for 2 weeks post-procedure.  Do not drive self for 2 weeks post-procedure. Contact a health care provider if you have:  A fever or chills.  You have redness, swelling, or pain around your insertion site. Get help right away if:  The catheter insertion area swells very fast.  You pass out.  You suddenly start to sweat or your skin gets clammy.  The catheter insertion area is bleeding, and the bleeding does not stop when you hold steady pressure on the area.  The area near or just beyond the catheter insertion site becomes  pale, cool, tingly, or numb. These symptoms may represent a serious problem that is an emergency. Do not wait to see if the symptoms will go away. Get medical help right away. Call your local emergency services (911 in the [place] Do not drive yourself to the hospital.  This information is not intended to replace advice given to you by your health care provider. Make sure you discuss any questions you have with your health care provider. Document Revised: 09/12/2017 Document Reviewed: 09/12/2017 Elsevier Patient Education  2020 [employer] 

## 2020-10-08 NOTE — Evaluation (Signed)
Occupational Therapy Evaluation Patient Details Name: Rebecca Singh MRN: [id number] DOB: 27-Mar-1956 Today's Date: 10/08/2020    History of Present Illness 65 yo female with L side weakness admitted for R MCA with revascularization TICI3 on 10/07/20. PMH polysubstance abuse pacemaker L TKA ongoing tobacco abuse, chronic pain, nonischemic cardiomyopathy obses  peripartum CVA with no residual deficits COPD prediabetes   Clinical Impression   PT admitted with R MCA with revascuarlization. Pt currently with functional limitiations due to the deficits listed below (see OT problem list). Pt currently with visual deficits and reports "I feel like I am cold on the inside" due to body tremors. Pt reports L UE deficits and dropping objects. Pt with difficulty maintaining gaze stabilization.  Pt will benefit from skilled OT to increase their independence and safety with adls and balance to allow discharge CIR.     Follow Up Recommendations  CIR    Equipment Recommendations  Other (comment) (has DME at home from L TKA)    Recommendations for Other Services Rehab consult     Precautions / Restrictions Precautions Precautions: Fall      Mobility Bed Mobility Overal bed mobility: Needs Assistance Bed Mobility: Rolling;Supine to Sit;Sit to Supine Rolling: Mod assist   Supine to sit: +2 for physical assistance;Mod assist Sit to supine: Max assist   General bed mobility comments: pt requires hand over hand to reach for bed rail on L side. Pt needs (A) to elevate trunk from surface and bil LE off eob. pt requires min- mod (A) at EOB for static sitting balance. Pt requires mod v/c to return to supine and max (A) to place bil LE back on bed surface    Transfers Overall transfer level: Needs assistance Equipment used: 2 person hand held assist Transfers: Sit to/from Stand Sit to Stand: +2 physical assistance;Min assist         General transfer comment: pt able to power up but with knee  flexion and foward posture. pt needs physical (A) to help extend L knee while standing. pt with BIL UE on back of chair to complete static standing. pt noted to have head hands and legs tremor with movement.    Balance Overall balance assessment: Needs assistance Sitting-balance support: Bilateral upper extremity supported;Feet supported Sitting balance-Leahy Scale: Poor     Standing balance support: Bilateral upper extremity supported;During functional activity Standing balance-Leahy Scale: Poor Standing balance comment: reliant on BIL UE and external support of therapist                           ADL either performed or assessed with clinical judgement   ADL Overall ADL's : Needs assistance/impaired     Grooming: Moderate assistance;Sitting   Upper Body Bathing: Moderate assistance;Bed level   Lower Body Bathing: Maximal assistance;Sit to/from stand           Toilet Transfer: +2 for physical assistance;Moderate assistance Toilet Transfer Details (indicate cue type and reason): simulated eob to stand simualted BSC           General ADL Comments: pt very guarded and fear of falling     Vision Baseline Vision/History: No visual deficits Additional Comments: reports a need to keep focusing on stuff, report slight sensitivity, able to read clock, closing L eye reports seeing diplopia sometimes. pt with peripheral vision deficits noted . pt becoming restless with testing and needs calming cues to sustain task     Perception Perception Perception Tested?:  Yes Perception Deficits: Topographical orientation;Inattention/neglect Inattention/Neglect: Does not attend to left visual field Spatial deficits: pt reports she feels like she is falling when supine in the bed. pt reports like she is falling foward when sitting eob with foward head position   Praxis      Pertinent Vitals/Pain Pain Assessment: Faces Faces Pain Scale: Hurts even more Pain Location: back  pain Pain Descriptors / Indicators: Headache;Grimacing Pain Intervention(s): Monitored during session;Repositioned     Hand Dominance Right   Extremity/Trunk Assessment Upper Extremity Assessment Upper Extremity Assessment: LUE deficits/detail;RUE deficits/detail RUE Deficits / Details: fingers numb and tingling, reports normal sensation at elbow to shoulder reports dropping thing that she thinks she is holding. pt states "I am just clumspy" LUE Deficits / Details: fingers numb and tingling, reports normal sensation at elbow to shoulder LUE Sensation: decreased light touch LUE Coordination: decreased fine motor;decreased gross motor   Lower Extremity Assessment Lower Extremity Assessment: LLE deficits/detail LLE Deficits / Details: numb and tingling at feet   Cervical / Trunk Assessment Cervical / Trunk Assessment:  (chronic back pain)   Communication Communication Communication: No difficulties   Cognition Arousal/Alertness: Awake/alert Behavior During Therapy: Anxious Overall Cognitive Status: Within Functional Limits for tasks assessed                                     General Comments  VSS, light sensitive so all nights off    Exercises Exercises: Other exercises Other Exercises Other Exercises: worked on gaze stabilization and practiced throughout session. pt closing L eye at times but denies diplopia   Shoulder Instructions      Home Living Family/patient expects to be discharged to:: Private residence Living Arrangements: Parent;Other relatives;Children (son and grandchildren/ sister) Available Help at Discharge: Family Type of Home: Mobile home Home Access: Stairs to enter   Entrance Stairs-Rails: Right Home Layout: One level     Bathroom Shower/Tub: Producer, television/film/video: [employer])     Home Equipment: Emergency planning/management officer - 2 wheels;Bedside commode   Additional Comments: father that is 58 yo lives in the home as well, x  3 boston terriers Psychologist, educational ( triple D[employer]) [name], grandson 90 granddaughter 62 yo oldest son, sister      Prior Functioning/Environment Level of Independence: Independent with assistive device(s)        Comments: 3 months s/p L TKA-        OT Problem List: Decreased strength;Decreased activity tolerance;Impaired balance (sitting and/or standing);Decreased cognition;Decreased safety awareness;Decreased knowledge of use of DME or AE;Decreased knowledge of precautions;Impaired vision/perception;Decreased coordination;Cardiopulmonary status limiting activity;Obesity;Impaired UE functional use;Pain      OT Treatment/Interventions: Self-care/ADL training;Therapeutic exercise;Neuromuscular education;Energy conservation;DME and/or AE instruction;Manual therapy;Modalities;Therapeutic activities;Cognitive remediation/compensation;Visual/perceptual remediation/compensation;Patient/family education;Balance training    OT Goals(Current goals can be found in the care plan section) Acute Rehab OT Goals Patient Stated Goal: to return to work OT Goal Formulation: With patient Time For Goal Achievement: 10/22/20 Potential to Achieve Goals: Good  OT Frequency: Min 2X/week   Barriers to D/C:            Co-evaluation PT/OT/SLP Co-Evaluation/Treatment: Yes Reason for Co-Treatment: Necessary to address cognition/behavior during functional activity;For patient/therapist safety;To address functional/ADL transfers   OT goals addressed during session: ADL's and self-care;Proper use of Adaptive equipment and DME;Strengthening/ROM      AM-PAC OT "6 Clicks" Daily Activity     Outcome Measure Help from another  person eating meals?: A Lot Help from another person taking care of personal grooming?: A Lot Help from another person toileting, which includes using toliet, bedpan, or urinal?: A Lot Help from another person bathing (including washing, rinsing, drying)?: A Lot Help from another person to put  on and taking off regular upper body clothing?: A Lot Help from another person to put on and taking off regular lower body clothing?: A Lot 6 Click Score: 12   End of Session Nurse Communication: Mobility status;Precautions  Activity Tolerance: Patient tolerated treatment well Patient left: in bed;with call bell/phone within reach;with bed alarm set;with SCD's reapplied  OT Visit Diagnosis: Unsteadiness on feet (R26.81);Muscle weakness (generalized) (M62.81);Pain                Time: 6811-5726 OT Time Calculation (min): 26 min Charges:  OT General Charges $OT Visit: 1 Visit OT Evaluation $OT Eval Moderate Complexity: 1 Mod   Brynn, OTR/L  Acute Rehabilitation Services Pager: [phone] Office: [phone] .   Mateo Flow 10/08/2020, 11:31 AM

## 2020-10-08 NOTE — Progress Notes (Addendum)
STROKE TEAM PROGRESS NOTE   INTERVAL HISTORY Patient evaluated at bedside this morning, no family present in the room.  Patient is alert, awake, cooperative at times, oriented to time place and person.  During initial conversation patient was able to keep her eyes open but during examination she kept her eyes shut closed and did not cooperate with the examination.  She complains of severe headache receiving Fioricet as needed.  She is aware that she had a stroke and that is why she presented to the hospital.  She looks tremulous but denies craving for tobacco.  States she has chronic back pain but denies neck pain.  Blood pressure well controlled.  Cards master has been consulted to do ICD interrogation for a fib, appreciate their assistance.  See below for details of neurological exam.  Vitals:   10/08/20 0500 10/08/20 0600 10/08/20 0700 10/08/20 0800  BP: 117/78 115/73 121/77 128/74  Pulse: 69 70 64 75  Resp: 14 13 15 17   Temp:      TempSrc:      SpO2: 95% 94% 95% 95%  Weight:      Height:       CBC:  Recent Labs  Lab 10/07/20 0953 10/07/20 0959 10/08/20 0443  WBC 6.3  --  8.5  NEUTROABS 3.7  --  7.0  HGB 14.6 15.0 12.6  HCT 46.2* 44.0 39.6  MCV 91.1  --  90.0  PLT 288  --  265   Basic Metabolic Panel:  Recent Labs  Lab 10/07/20 0953 10/07/20 0959 10/08/20 0443  NA 138 139 137  K 4.4 4.3 4.0  CL 107 107 109  CO2 22  --  19*  GLUCOSE 109* 101* 105*  BUN 29* 33* 22  CREATININE 1.03* 1.00 0.84  CALCIUM 9.3  --  8.7*   Lipid Panel:  Recent Labs  Lab 10/08/20 0443  CHOL 124  TRIG 52  HDL 49  CHOLHDL 2.5  VLDL 10  LDLCALC 65   HgbA1c:  Recent Labs  Lab 10/08/20 0443  HGBA1C 6.1*   Urine Drug Screen:  Recent Labs  Lab 10/07/20 1719  LABOPIA NONE DETECTED  COCAINSCRNUR NONE DETECTED  LABBENZ NONE DETECTED  AMPHETMU NONE DETECTED  THCU NONE DETECTED  LABBARB NONE DETECTED     IMAGING past 24 hours  CT Head Code stroke wo  contrast 10/07/2020  IMPRESSION: 1. Hypodensity right frontal parietal lobe suspicious for acute infarct given the symptoms. In addition, there is a probable small hyperdense vessel in the right sylvian fissure involving the right middle cerebral artery. 2. ASPECTS is 9   CT Angio Head Neck w or wo contrast 10/07/2020  IMPRESSION: 1. Right M2/MCA posterior division branch occlusion with moderate distal branch opacification via leptomeningeal collaterals. 2. No hemodynamically significant stenosis in the neck.  Echocardiogram 10/08/2020  Pending  PHYSICAL EXAM  General: HEENT: Cardiovascular: Respiratory: Gastrointestinal: Extremities: Psych: Neurological:  Mental Status: Alert, oriented, thought content appropriate.  Speech fluent without evidence of aphasia except cannot spell the word world backwards.  Able to follow 3 step commands without difficulty. Cranial Nerves: II:  ?Visual fields grossly normal, pupils equal, round, reactive to light and accommodation, noncooperation III,IV, VI: ptosis not present, ?extra-ocular motions intact bilaterally V,VII: smile symmetric, facial light touch sensation decreased on left VIII: hearing normal bilaterally IX,X: uvula rises symmetrically XI: bilateral shoulder shrug XII: midline tongue extension without atrophy or fasciculations  Motor: Right : Upper extremity   5/5    Left:  Upper extremity   3/5  Lower extremity   5/5     Lower extremity   4/5 Tone and bulk:normal tone throughout; no atrophy noted Sensory: Pinprick and light touch is decreased on left side. Deep Tendon Reflexes:  Right: Upper Extremity   Left: Upper extremity   biceps (C-5 to C-6) 2/4   biceps (C-5 to C-6) 2/4 tricep (C7) 2/4    triceps (C7) 2/4 Brachioradialis (C6) 2/4  Brachioradialis (C6) 2/4  Lower Extremity Lower Extremity  quadriceps (L-2 to L-4) 2/4   quadriceps (L-2 to L-4) 2/4 Achilles (S1) 2/4   Achilles (S1) 2/4  Plantars: Right:  downgoing   Left: downgoing Cerebellar: Mildly abnormal finger-to-nose, and keep her eyes open Gait: Deferred     ASSESSMENT/PLAN Ms. KHAMANI DANIELY is a 65 y.o. 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  Head CT revealed a potential acute infarct in right parietal region.  CTA confirmed an acute right M2 occlusion.  No tPA given due to recent AICD placement.  MRI is contraindicated due to recent pacemaker procedure, per MRI technician need a minimum of 6 weeks from procedure.  Patient is s/p thrombectomy.  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  Code Stroke CT head:  Hypodensity right frontal parietal lobe suspicious for acute infarct. In addition, hyperdense sign at right sylvian fissure MCA  CTA head & neck : Right M2/MCA posterior division branch occlusion with moderate distal branch opacification via leptomeningeal collaterals.   MRI: Contraindicated due to recent pacemaker switch, per MRI technician need a minimum of 6 weeks from procedure. (09/25/2020)  IR - R M2 inferior division TICI 2c revasc  CT head post op 1/25 - unchanged  CT repeat in am  2D Echo - Pending  ICD interrogation pending  LDL 65  HgbA1c: 6.1  VTE prophylaxis -SCDs  aspirin 81 mg daily prior to admission, now on No antithrombotic now due to [facility] post op  Therapy recommendations: CIR main area  Disposition: Pending  nonischemic cardiomyopathy CHF  EF 30 to 35% in 2013  S/p ICD, interrogation pending  EF improved to 65 to 70% in 2016  Current TTE pending  Recent ICD exchange on  09/25/20  Hypertension  Home meds: Metoprolol 25 mg  Now on labetalol 5 mg PRN, now off Cleviprex . SBP<120-140 for 24 hours, then SBP<160 . 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 This patient's care requires constant monitoring of vital signs, hemodynamics, respiratory and cardiac monitoring, review of  multiple databases, neurological assessment, discussion with family, other specialists and medical decision making of high complexity. I spent 35 minutes of neurocritical care time in the care of this patient.  Marvel Plan, MD PhD Stroke Neurology 10/08/2020 3:57 PM  To contact Stroke Continuity provider, please refer to [email or web address]. After hours, contact General Neurology

## 2020-10-08 NOTE — Progress Notes (Signed)
Inpatient Rehab Admissions Coordinator Note:   Per therapy recommendations, pt was screened for CIR candidacy by Estill Dooms, PT, DPT.  At this time we are recommending a CIR consult and I will place an order per our protocol.  Please contact me with questions.   Estill Dooms, PT, DPT [phone] 10/08/20 3:11 PM

## 2020-10-08 NOTE — Evaluation (Signed)
Physical Therapy Evaluation Patient Details Name: Rebecca Singh MRN: [id number] DOB: June 06, 1956 Today's Date: 10/08/2020   History of Present Illness  65 yo female with L side weakness admitted for R MCA with revascularization TICI3 on 10/07/20. PMH polysubstance abuse pacemaker L TKA ongoing tobacco abuse, chronic pain, nonischemic cardiomyopathy obses  peripartum CVA with no residual deficits COPD prediabetes  Clinical Impression  Pt admitted with/for stroke/left sided weakness, s/p sucessful revascularization.  Pt presently min to max assist form basic mobility with incoordination and questionable apraxia.Marland Kitchen  Pt currently limited functionally due to the problems listed. ( See problems list.)   Pt will benefit from PT to maximize function and safety in order to get ready for next venue listed below.     Follow Up Recommendations CIR    Equipment Recommendations  Other (comment);None recommended by PT (TBA)    Recommendations for Other Services Rehab consult     Precautions / Restrictions Precautions Precautions: Fall      Mobility  Bed Mobility Overal bed mobility: Needs Assistance Bed Mobility: Rolling;Supine to Sit;Sit to Supine Rolling: Mod assist   Supine to sit: +2 for physical assistance;Mod assist Sit to supine: Max assist   General bed mobility comments: pt requires hand over hand to reach for bed rail on L side. Pt needs (A) to elevate trunk from surface and bil LE off eob. pt requires min- mod (A) at EOB for static sitting balance. Pt requires mod v/c to return to supine and max (A) to place bil LE back on bed surface    Transfers Overall transfer level: Needs assistance Equipment used: 2 person hand held assist Transfers: Sit to/from Stand Sit to Stand: +2 physical assistance;Min assist         General transfer comment: pt able to power up but with knee flexion and foward posture. pt needs physical (A) to help extend L knee while standing. pt with BIL UE  on back of chair to complete static standing. pt noted to have head hands and legs tremor with movement.  Ambulation/Gait             General Gait Details: NT  Stairs            Wheelchair Mobility    Modified Rankin (Stroke Patients Only) Modified Rankin (Stroke Patients Only) Pre-Morbid Rankin Score: No symptoms Modified Rankin: Severe disability     Balance Overall balance assessment: Needs assistance Sitting-balance support: Bilateral upper extremity supported;Feet supported Sitting balance-Leahy Scale: Poor     Standing balance support: Bilateral upper extremity supported;During functional activity Standing balance-Leahy Scale: Poor Standing balance comment: reliant on BIL UE and external support of therapist.  Pt unable to bear full weight on L LE and knee flexed consistently                             Pertinent Vitals/Pain Pain Assessment: Faces Faces Pain Scale: Hurts even more Pain Location: back pain Pain Descriptors / Indicators: Headache;Grimacing Pain Intervention(s): Monitored during session    Home Living Family/patient expects to be discharged to:: Private residence Living Arrangements: Parent;Other relatives;Children (son and grandchildren/ sister) Available Help at Discharge: Family Type of Home: Mobile home Home Access: Stairs to enter Entrance Stairs-Rails: Right   Home Layout: One level Home Equipment: Emergency planning/management officer - 2 wheels;Bedside commode Additional Comments: father that is 4 yo lives in the home as well, x 3 boston terriers Psychologist, educational ( triple D- [employer]) [place],  grandson 68 granddaughter 41 yo oldest son, sister    Prior Function Level of Independence: Independent with assistive device(s)         Comments: 3 months s/p L TKA-     Hand Dominance   Dominant Hand: Right    Extremity/Trunk Assessment   Upper Extremity Assessment Upper Extremity Assessment: LUE deficits/detail;RUE deficits/detail RUE  Deficits / Details: fingers numb and tingling, reports normal sensation at elbow to shoulder reports dropping thing that she thinks she is holding. pt states "I am just clumspy" LUE Deficits / Details: fingers numb and tingling, reports normal sensation at elbow to shoulder LUE Sensation: decreased light touch LUE Coordination: decreased fine motor;decreased gross motor    Lower Extremity Assessment Lower Extremity Assessment: LLE deficits/detail;RLE deficits/detail RLE Deficits / Details: grossly 3 to 3+ with incoordination, jerks/tremulous RLE Coordination: decreased fine motor LLE Deficits / Details: numb and tingling at feet, grossly 2+/5 large muscle groups.  incoordination.  ? motor apraxias LLE Coordination: decreased fine motor;decreased gross motor    Cervical / Trunk Assessment Cervical / Trunk Assessment:  (chronic back pain)  Communication   Communication: No difficulties  Cognition Arousal/Alertness: Awake/alert Behavior During Therapy: Anxious Overall Cognitive Status: Within Functional Limits for tasks assessed                                        General Comments General comments (skin integrity, edema, etc.): VSS, light sensitive so all nights off    Exercises Other Exercises Other Exercises: worked on gaze stabilization and practiced throughout session. pt closing L eye at times but denies diplopia   Assessment/Plan    PT Assessment Patient needs continued PT services  PT Problem List Decreased strength;Decreased activity tolerance;Decreased balance;Decreased mobility;Decreased coordination;Impaired sensation;Pain       PT Treatment Interventions Gait training;Functional mobility training;Therapeutic activities;Balance training;Neuromuscular re-education;Patient/family education    PT Goals (Current goals can be found in the Care Plan section)  Acute Rehab PT Goals Patient Stated Goal: to return to work PT Goal Formulation: With  patient Time For Goal Achievement: 10/22/20 Potential to Achieve Goals: Good    Frequency Min 4X/week   Barriers to discharge        Co-evaluation PT/OT/SLP Co-Evaluation/Treatment: Yes Reason for Co-Treatment: Necessary to address cognition/behavior during functional activity PT goals addressed during session: Mobility/safety with mobility OT goals addressed during session: ADL's and self-care;Proper use of Adaptive equipment and DME;Strengthening/ROM       AM-PAC PT "6 Clicks" Mobility  Outcome Measure Help needed turning from your back to your side while in a flat bed without using bedrails?: A Lot Help needed moving from lying on your back to sitting on the side of a flat bed without using bedrails?: A Lot Help needed moving to and from a bed to a chair (including a wheelchair)?: A Little Help needed standing up from a chair using your arms (e.g., wheelchair or bedside chair)?: A Little Help needed to walk in hospital room?: A Lot Help needed climbing 3-5 steps with a railing? : A Lot 6 Click Score: 14    End of Session   Activity Tolerance: Patient tolerated treatment well Patient left: in bed;with call bell/phone within reach;with bed alarm set Nurse Communication: Mobility status PT Visit Diagnosis: Unsteadiness on feet (R26.81);Other abnormalities of gait and mobility (R26.89);Hemiplegia and hemiparesis Hemiplegia - Right/Left: Left Hemiplegia - dominant/non-dominant: Non-dominant    Time: 5361-4431  PT Time Calculation (min) (ACUTE ONLY): 26 min   Charges:   PT Evaluation $PT Eval Moderate Complexity: 1 Mod          10/08/2020  Jacinto Halim., PT Acute Rehabilitation Services [phone]  (pager) [phone]  (office)  Eliseo Gum Remmi Armenteros 10/08/2020, 1:29 PM

## 2020-10-08 NOTE — Plan of Care (Signed)

## 2020-10-08 NOTE — Progress Notes (Signed)
Referring Physician(s): Code stroke- Bhagat, Srishti L (neurology)  Supervising Physician: Julieanne Cotton  Patient Status:  [facility] - In-pt  Chief Complaint: "Headache"  Subjective:  History of acute CVA s/p cerebral arteriogram with emergent mechanical thrombectomy of right MCA M2 occlusion achieving a TICI3 revascularization via right femoral approach 10/07/2020 by Dr. Corliss Skains. Patient awake and alert laying in bed. Complains of headache behind eyes (probably secondary to subarachnoid hyperdensity). Denies N/V associated with headache. Can spontaneously move all extremities with weakness of left side. Right femoral puncture site c/d/i.   Allergies: Codeine, Aspirin, Latex, and Other  Medications: Prior to Admission medications   Medication Sig Start Date End Date Taking? Authorizing Provider  ibuprofen (ADVIL) 200 MG tablet Take 200-400 mg by mouth every 6 (six) hours as needed (for knee and back pain).   Yes [provider]  NEXIUM 24HR 20 MG capsule Take 20 mg by mouth daily before breakfast.   Yes [provider]  aspirin EC 81 MG EC tablet Take 1 tablet (81 mg total) by mouth daily. Patient not taking: No sig reported 11/17/17   Adrian Saran, MD  cyclobenzaprine (FLEXERIL) 10 MG tablet Take 1 tablet (10 mg total) by mouth 3 (three) times daily as needed. Patient not taking: No sig reported 12/10/18   Faythe Ghee, PA-C  metoprolol tartrate (LOPRESSOR) 25 MG tablet Take 1 tablet (25 mg total) by mouth 2 (two) times daily. Patient not taking: No sig reported 11/17/17   Adrian Saran, MD  nicotine (NICODERM CQ - DOSED IN MG/24 HOURS) 21 mg/24hr patch Place 1 patch (21 mg total) onto the skin daily. Patient not taking: No sig reported 11/17/17   Adrian Saran, MD  ondansetron (ZOFRAN-ODT) 4 MG disintegrating tablet Take 1 tablet (4 mg total) by mouth every 8 (eight) hours as needed. Patient not taking: Reported on 10/07/2020 12/10/18   Faythe Ghee, PA-C   oxyCODONE-acetaminophen (PERCOCET/ROXICET) 5-325 MG tablet Take 1-2 tablets by mouth every 6 (six) hours as needed for severe pain. 12/10/18   Fisher, Roselyn Bering, PA-C  predniSONE (STERAPRED UNI-PAK 21 TAB) 10 MG (21) TBPK tablet Take 6 pills on day one then decrease by 1 pill each day Patient not taking: Reported on 10/07/2020 12/10/18   Faythe Ghee, PA-C     Vital Signs: BP 128/74   Pulse 75   Temp 98.1 F (36.7 C) (Axillary)   Resp 17   Ht 5\' 6"  (1.676 m)   Wt 216 lb 0.8 oz (98 kg)   SpO2 95%   BMI 34.87 kg/m   Physical Exam Vitals and nursing note reviewed.  Constitutional:      General: She is not in acute distress. Pulmonary:     Effort: Pulmonary effort is normal. No respiratory distress.  Skin:    General: Skin is warm and dry.     Comments: Right femoral puncture site soft without active bleeding or hematoma.  Neurological:     Mental Status: She is alert.     Comments: Alert, awake, and oriented x3. Speech and comprehension intact. PERRL bilaterally. No facial asymmetry. Tongue midline. Can spontaneously move all extremities with weakness of left side (LLE dorsiflexion/plantiflexion 4/5). No pronator drift. Distal pulses (DPs) 2+ bilaterally.     Imaging: CT Code Stroke CTA Head W/WO contrast  Result Date: 10/07/2020 CLINICAL DATA:  Neuro deficit, acute, stroke suspected. EXAM: CT ANGIOGRAPHY HEAD AND NECK TECHNIQUE: Multidetector CT imaging of the head and neck was performed using the standard  protocol during bolus administration of intravenous contrast. Multiplanar CT image reconstructions and MIPs were obtained to evaluate the vascular anatomy. Carotid stenosis measurements (when applicable) are obtained utilizing NASCET criteria, using the distal internal carotid diameter as the denominator. CONTRAST:  94mL OMNIPAQUE IOHEXOL 350 MG/ML SOLN COMPARISON:  Head CT October 07, 2020. FINDINGS: CTA NECK FINDINGS Aortic arch: Standard branching. Imaged portion shows  no evidence of aneurysm or dissection. Mild atherosclerotic changes of the aortic arch without stenosis at the branch origins. Increased tortuosity of the major arch vessels likely related to longstanding hypertension. Right carotid system: Prominent tortuosity of the right common and cervical segment of right internal carotid artery. No evidence of dissection, stenosis (50% or greater) or occlusion. Left carotid system: Increased tortuosity of the proximal left common carotid artery and upper cervical segment of the left internal carotid artery. No evidence of dissection, stenosis (50% or greater) or occlusion. Vertebral arteries: Left dominant. V1 segment of the right vertebral artery is partially obscured by streak artifact. No evidence of dissection, stenosis (50% or greater) or occlusion. Skeleton: No acute findings. Other neck: Ectopic thyroid tissue in the midline with small right and left lobes. Paratracheal lymph nodes below the thyroid gland are unchanged from prior chest CT performed 2019. Upper chest: No acute findings. Review of the MIP images confirms the above findings CTA HEAD FINDINGS Anterior circulation: Small calcified plaques in the bilateral carotid siphons without hemodynamically significant stenosis. There is a right M2/MCA posterior division branch occlusion with moderate distal branch opacification via leptomeningeal collaterals. Left MCA vascular tree in bilateral ACA vascular trees show no evidence of significant stenosis or proximal occlusion. Posterior circulation: No significant stenosis, proximal occlusion, aneurysm, or vascular malformation. Venous sinuses: As permitted by contrast timing, patent. Review of the MIP images confirms the above findings IMPRESSION: 1. Right M2/MCA posterior division branch occlusion with moderate distal branch opacification via leptomeningeal collaterals. 2. No hemodynamically significant stenosis in the neck. These results were called by telephone at  the time of interpretation on 10/07/2020 at 10:26 am to provider [facility] , who verbally acknowledged these results. Aortic Atherosclerosis (ICD10-I70.0). Electronically Signed   By: Baldemar Lenis M.D.   On: 10/07/2020 10:50   CT HEAD WO CONTRAST  Addendum Date: 10/07/2020   ADDENDUM REPORT: 10/07/2020 18:54 ADDENDUM: The described small volume hyperdensity within the right sylvian fissure appears similar to the head CT performed earlier today at 12:07 p.m. immediately following the revascularization procedure. These results were called by telephone at the time of interpretation on 10/07/2020 at 6:54 pm to provider Dr. Iver Nestle, who verbally acknowledged these results. Electronically Signed   By: Jackey Loge DO   On: 10/07/2020 18:54   Result Date: 10/07/2020 CLINICAL DATA:  Stroke, follow-up. EXAM: CT HEAD WITHOUT CONTRAST TECHNIQUE: Contiguous axial images were obtained from the base of the skull through the vertex without intravenous contrast. COMPARISON:  Noncontrast head CT performed earlier today 10/07/2020, CT angiogram head/neck 10/07/2020. FINDINGS: Brain: Cerebral volume is normal. New from the prior examination, there is hyperdensity within the right sylvian fissure which may reflect extravasated contrast and/or subarachnoid hemorrhage. Possible subtle acute infarction changes within the right frontoparietal lobe were better appreciated on the noncontrast head CT performed formed earlier today No evidence of intracranial mass. No midline shift or hydrocephalus. Vascular: An occluded right M2 MCA posterior division branch was appreciated on the CT a performed earlier today. Atherosclerotic calcifications Skull: Normal. Negative for fracture or focal lesion. Sinuses/Orbits: Visualized orbits show  no acute finding. Mild bilateral ethmoid sinus mucosal thickening. IMPRESSION: New from the noncontrast head CT performed earlier today, there is small volume hyperdensity within the right  sylvian fissure which may reflect extravasated contrast and/or subarachnoid hemorrhage. Possible subtle acute infarction changes within the right frontoparietal lobe were better appreciated on the exam earlier today. Electronically Signed: By: Jackey Loge DO On: 10/07/2020 18:42   CT Code Stroke CTA Neck W/WO contrast  Result Date: 10/07/2020 CLINICAL DATA:  Neuro deficit, acute, stroke suspected. EXAM: CT ANGIOGRAPHY HEAD AND NECK TECHNIQUE: Multidetector CT imaging of the head and neck was performed using the standard protocol during bolus administration of intravenous contrast. Multiplanar CT image reconstructions and MIPs were obtained to evaluate the vascular anatomy. Carotid stenosis measurements (when applicable) are obtained utilizing NASCET criteria, using the distal internal carotid diameter as the denominator. CONTRAST:  9mL OMNIPAQUE IOHEXOL 350 MG/ML SOLN COMPARISON:  Head CT October 07, 2020. FINDINGS: CTA NECK FINDINGS Aortic arch: Standard branching. Imaged portion shows no evidence of aneurysm or dissection. Mild atherosclerotic changes of the aortic arch without stenosis at the branch origins. Increased tortuosity of the major arch vessels likely related to longstanding hypertension. Right carotid system: Prominent tortuosity of the right common and cervical segment of right internal carotid artery. No evidence of dissection, stenosis (50% or greater) or occlusion. Left carotid system: Increased tortuosity of the proximal left common carotid artery and upper cervical segment of the left internal carotid artery. No evidence of dissection, stenosis (50% or greater) or occlusion. Vertebral arteries: Left dominant. V1 segment of the right vertebral artery is partially obscured by streak artifact. No evidence of dissection, stenosis (50% or greater) or occlusion. Skeleton: No acute findings. Other neck: Ectopic thyroid tissue in the midline with small right and left lobes. Paratracheal lymph nodes  below the thyroid gland are unchanged from prior chest CT performed 2019. Upper chest: No acute findings. Review of the MIP images confirms the above findings CTA HEAD FINDINGS Anterior circulation: Small calcified plaques in the bilateral carotid siphons without hemodynamically significant stenosis. There is a right M2/MCA posterior division branch occlusion with moderate distal branch opacification via leptomeningeal collaterals. Left MCA vascular tree in bilateral ACA vascular trees show no evidence of significant stenosis or proximal occlusion. Posterior circulation: No significant stenosis, proximal occlusion, aneurysm, or vascular malformation. Venous sinuses: As permitted by contrast timing, patent. Review of the MIP images confirms the above findings IMPRESSION: 1. Right M2/MCA posterior division branch occlusion with moderate distal branch opacification via leptomeningeal collaterals. 2. No hemodynamically significant stenosis in the neck. These results were called by telephone at the time of interpretation on 10/07/2020 at 10:26 am to provider [facility] , who verbally acknowledged these results. Aortic Atherosclerosis (ICD10-I70.0). Electronically Signed   By: Baldemar Lenis M.D.   On: 10/07/2020 10:50   CT HEAD CODE STROKE WO CONTRAST  Result Date: 10/07/2020 CLINICAL DATA:  Code stroke.    Left-sided weakness.  Slurred speech EXAM: CT HEAD WITHOUT CONTRAST TECHNIQUE: Contiguous axial images were obtained from the base of the skull through the vertex without intravenous contrast. COMPARISON:  None. FINDINGS: Brain: Small hypodensity right frontal parietal lobe near the motor cortex. Probable acute infarct. Negative for hemorrhage Ventricle size normal.  Negative for mass lesion. Vascular: Suspect hyperdense vessel right M2 /M3 region in the right sylvian fissure. Skull: Negative Sinuses/Orbits: Paranasal sinuses clear.  Negative orbit Other: None ASPECTS ([place] Stroke Program  Early CT Score) - Ganglionic level infarction (caudate,  lentiform nuclei, internal capsule, insula, M1-M3 cortex): 6 - Supraganglionic infarction (M4-M6 cortex): 3 Total score (0-10 with 10 being normal): 9 IMPRESSION: 1. Hypodensity right frontal parietal lobe suspicious for acute infarct given the symptoms. In addition, there is a probable small hyperdense vessel in the right sylvian fissure involving the right middle cerebral artery. 2. ASPECTS is 9 3. These results were called by telephone at the time of interpretation on 10/07/2020 at 10:12 am to provider [facility] , who verbally acknowledged these results. 4. Code stroke imaging results were communicated on 10/07/2020 at 10:12 am to provider Bhagat via text page Electronically Signed   By: Marlan Palau M.D.   On: 10/07/2020 10:13    Labs:  CBC: Recent Labs    10/07/20 0953 10/07/20 0959 10/08/20 0443  WBC 6.3  --  8.5  HGB 14.6 15.0 12.6  HCT 46.2* 44.0 39.6  PLT 288  --  265    COAGS: Recent Labs    10/07/20 0953  INR 1.0  APTT 27    BMP: Recent Labs    10/07/20 0953 10/07/20 0959 10/08/20 0443  NA 138 139 137  K 4.4 4.3 4.0  CL 107 107 109  CO2 22  --  19*  GLUCOSE 109* 101* 105*  BUN 29* 33* 22  CALCIUM 9.3  --  8.7*  CREATININE 1.03* 1.00 0.84  GFRNONAA >60  --  >60    LIVER FUNCTION TESTS: Recent Labs    10/07/20 0953  BILITOT 0.7  AST 21  ALT 17  ALKPHOS 62  PROT 6.9  ALBUMIN 3.8    Assessment and Plan:  History of acute CVA s/p cerebral arteriogram with emergent mechanical thrombectomy of right MCA M2 occlusion achieving a TICI3 revascularization via right femoral approach 10/07/2020 by Dr. Corliss Skains. Patient's condition stable- speech intact, moving all extremities with weakness of left side. Right femoral puncture site stable, distal pulses (DPs) 2+ bilaterally. For CT head today. Further plans per neurology- appreciate and agree with management. Please call NIR with  questions/concerns.   Electronically Signed: Elwin Mocha, PA-C 10/08/2020, 9:54 AM   I spent a total of 25 Minutes at the the patient's bedside AND on the patient's hospital floor or unit, greater than 50% of which was counseling/coordinating care for CVA s/p revascularization.

## 2020-10-09 ENCOUNTER — Inpatient Hospital Stay (HOSPITAL_COMMUNITY): Payer: Medicare HMO

## 2020-10-09 DIAGNOSIS — I63511 Cerebral infarction due to unspecified occlusion or stenosis of right middle cerebral artery: Secondary | ICD-10-CM

## 2020-10-09 LAB — ECHOCARDIOGRAM COMPLETE
Area-P 1/2: 2.13 cm2
Calc EF: 40.1 %
Height: 66 in
S' Lateral: 4.9 cm
Single Plane A2C EF: 44.3 %
Single Plane A4C EF: 40.8 %
Weight: 3456.81 oz

## 2020-10-09 MED ORDER — PHENOL 1.4 % MT LIQD
1.0000 | OROMUCOSAL | Status: DC | PRN
  Filled 2020-10-09: qty 177

## 2020-10-09 NOTE — Plan of Care (Signed)
  Problem: Education: Goal: Knowledge of disease or condition will improve Outcome: Progressing Goal: Knowledge of secondary prevention will improve Outcome: Progressing Goal: Knowledge of patient specific risk factors addressed and post discharge goals established will improve Outcome: Progressing Goal: Individualized Educational Video(s) Outcome: Progressing   Problem: Ischemic Stroke/TIA Tissue Perfusion: Goal: Complications of ischemic stroke/TIA will be minimized Outcome: Progressing   

## 2020-10-09 NOTE — Progress Notes (Signed)
  Echocardiogram 2D Echocardiogram has been performed.  Leta Jungling M 10/09/2020, 3:35 PM

## 2020-10-09 NOTE — Progress Notes (Signed)
Physical Therapy Treatment Patient Details Name: Rebecca Singh MRN: [id number] DOB: 27-Mar-1956 Today's Date: 10/09/2020    History of Present Illness 65 yo female with L side weakness admitted for R MCA with revascularization TICI3 on 10/07/20. 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 Patient demonstrates weakness in L hand and required verbal and tactile cuing to maintain a minimal grip on the walker. Patient continues to require physical assistance with OOB mobility to reduce falls risk.  Patient presents with decreased coordination in L UE and LE (UE>LE). Presents with inconsistencies in ability to utilize L UE demonstrating significant difficulty with elavating arm upon PT command but is observed moving arm against gravity immediately prior to session when talking on the phone. Patient will continue to benefit from acute PT services to improve balance and gait while also reducing falls risk. PT continues to recommend CIR placement at time of discharge.   Follow Up Recommendations  CIR     Equipment Recommendations  3in1 (PT) (hemiwalker, if pt discharges home today)    Recommendations for Other Services       Precautions / Restrictions Precautions Precautions: Fall Restrictions Weight Bearing Restrictions: No    Mobility  Bed Mobility Overal bed mobility: Needs Assistance Bed Mobility: Rolling;Supine to Sit Rolling: Supervision   Supine to sit: Min assist     General bed mobility comments: pt requires hand over hand to reach for bed rail on L side.  Transfers Overall transfer level: Needs assistance Equipment used: Rolling walker (2 wheeled) Transfers: Sit to/from Stand Sit to Stand: Min assist            Ambulation/Gait Ambulation/Gait  assistance: Editor, commissioning (Feet): 20 Feet (addtional trial of 15 feet) Assistive device: Rolling walker (2 wheeled);1 person hand held assist Gait Pattern/deviations: Decreased dorsiflexion - left;Decreased weight shift to left;Step-to pattern;Decreased stance time - left;Decreased stride length;Decreased step length - right;Decreased step length - left Gait velocity: reduced Gait velocity interpretation: <1.31 ft/sec, indicative of household ambulator General Gait Details: pt had difficulty grasping walker with L hand and pushed through her forearm on the L side. On intial ambulation utilized walker but on additional trial attempted to progress to 1 person hand held assist. pt reported feeling more comfortable and steady with Rw and requested to switch back.   Stairs             Wheelchair Mobility    Modified Rankin (Stroke Patients Only) Modified Rankin (Stroke Patients Only) Pre-Morbid Rankin Score: No symptoms Modified Rankin: Moderately severe disability     Balance Overall balance assessment: Needs assistance Sitting-balance support: No upper extremity supported;Feet supported Sitting balance-Leahy Scale: Good     Standing balance support: Bilateral upper extremity supported;Single extremity supported Standing balance-Leahy Scale: Poor Standing balance comment: at least min guard for standing safety                            Cognition Arousal/Alertness: Awake/alert Behavior During Therapy: WFL for tasks assessed/performed Overall Cognitive Status: Within Functional Limits for tasks assessed  Exercises General Exercises - Lower Extremity Ankle Circles/Pumps: AROM;5 reps Gluteal Sets: 5 reps;AROM Long Arc Quad: 5 reps;AROM    General Comments General comments (skin integrity, edema, etc.): VSS on RA      Pertinent Vitals/Pain Pain Assessment: No/denies pain    Home Living                       Prior Function            PT Goals (current goals can now be found in the care plan section) Acute Rehab PT Goals Patient Stated Goal: to return to work Progress towards PT goals: Progressing toward goals    Frequency    Min 4X/week      PT Plan Current plan remains appropriate    Co-evaluation              AM-PAC PT "6 Clicks" Mobility   Outcome Measure  Help needed turning from your back to your side while in a flat bed without using bedrails?: A Little Help needed moving from lying on your back to sitting on the side of a flat bed without using bedrails?: A Little Help needed moving to and from a bed to a chair (including a wheelchair)?: A Little Help needed standing up from a chair using your arms (e.g., wheelchair or bedside chair)?: A Little Help needed to walk in hospital room?: A Little Help needed climbing 3-5 steps with a railing? : A Lot 6 Click Score: 17    End of Session Equipment Utilized During Treatment: Gait belt Activity Tolerance: Patient tolerated treatment well Patient left: in chair;with call bell/phone within reach;with chair alarm set Nurse Communication: Mobility status PT Visit Diagnosis: Unsteadiness on feet (R26.81);Other abnormalities of gait and mobility (R26.89);Hemiplegia and hemiparesis Hemiplegia - Right/Left: Left Hemiplegia - dominant/non-dominant: Non-dominant     Time: 0354-6568 PT Time Calculation (min) (ACUTE ONLY): 28 min  Charges:  $Gait Training: 8-22 mins $Therapeutic Activity: 8-22 mins                     Dyke Maes, SPT Acute Rehabilitation  Pager: [phone]   Ilona Sorrel 10/09/2020, 3:32 PM

## 2020-10-09 NOTE — Progress Notes (Addendum)
STROKE TEAM PROGRESS NOTE   INTERVAL HISTORY No overnight events.   Patient evaluated at bedside this morning, no family present in the room.  Patient is alert, awake, oriented to time place and person this morning.  She still complains of headache but it is improving with medication.  Also, complains of neck pain she is explained this pain is because of the bleeding she had inside her head and she shows good understanding about it.  She had an ICD interrogation yesterday which does not show any runs of A. fib but planning to check it again today.  CT head scan is repeated this morning subacute right frontoparietal and right temporal infarct and also hyperdense material within the right sylvian fissure is unchanged.  Plan is to repeat CT head on 10/13/2020 and then make a decision about starting antiplatelet therapy.  PT/OT suggested CIR for this patient.  Blood pressure well controlled.  Neurological exam is improving.  Vitals:   10/08/20 2321 10/09/20 0323 10/09/20 0730 10/09/20 1100  BP: 124/80 119/83 133/82 119/68  Pulse: 64 63 (!) 57 64  Resp: 17 19 18 18   Temp: 98 F (36.7 C) 98 F (36.7 C) 98 F (36.7 C) 98 F (36.7 C)  TempSrc:  Oral Oral Oral  SpO2: 96% 96% 96% 96%  Weight:      Height:       CBC:  Recent Labs  Lab 10/07/20 0953 10/07/20 0959 10/08/20 0443  WBC 6.3  --  8.5  NEUTROABS 3.7  --  7.0  HGB 14.6 15.0 12.6  HCT 46.2* 44.0 39.6  MCV 91.1  --  90.0  PLT 288  --  265   Basic Metabolic Panel:  Recent Labs  Lab 10/07/20 0953 10/07/20 0959 10/08/20 0443  NA 138 139 137  K 4.4 4.3 4.0  CL 107 107 109  CO2 22  --  19*  GLUCOSE 109* 101* 105*  BUN 29* 33* 22  CREATININE 1.03* 1.00 0.84  CALCIUM 9.3  --  8.7*   Lipid Panel:  Recent Labs  Lab 10/08/20 0443  CHOL 124  TRIG 52  HDL 49  CHOLHDL 2.5  VLDL 10  LDLCALC 65   HgbA1c:  Recent Labs  Lab 10/08/20 0443  HGBA1C 6.1*   Urine Drug Screen:  Recent Labs  Lab 10/07/20 1719  LABOPIA NONE  DETECTED  COCAINSCRNUR NONE DETECTED  LABBENZ NONE DETECTED  AMPHETMU NONE DETECTED  THCU NONE DETECTED  LABBARB NONE DETECTED     IMAGING past 24 hours  Ct Head wo contrast 10/10/2019   IMPRESSION: Subacute right frontoparietal and right temporal infarcts.  Hyperdense material within the right sylvian fissure is unchanged.  CT Head Code stroke wo contrast 10/07/2020  IMPRESSION: 1. Hypodensity right frontal parietal lobe suspicious for acute infarct given the symptoms. In addition, there is a probable small hyperdense vessel in the right sylvian fissure involving the right middle cerebral artery. 2. ASPECTS is 9   CT Angio Head Neck w or wo contrast 10/07/2020  IMPRESSION: 1. Right M2/MCA posterior division branch occlusion with moderate distal branch opacification via leptomeningeal collaterals. 2. No hemodynamically significant stenosis in the neck.  Echocardiogram 10/08/2020  Pending  PHYSICAL EXAM  General: Elderly female lying comfortably in bed, NAD HEENT: [place]/AT, MMM, PERRLA Cardiovascular: Regular rate and rhythm Respiratory: No respiratory distress Gastrointestinal: Soft, no rigidity no, no tenderness Extremities: No peripheral edema Psych: Normal mood and affect Neurological:  Mental Status: Alert, oriented, thought content appropriate.  Speech  fluent without evidence of aphasia except cannot spell the word world backwards.  Able to follow 3 step commands without difficulty. Cranial Nerves: II:  Visual fields grossly normal, pupils equal, round, reactive to light and accommodation, noncooperation III,IV, VI: ptosis not present, extra-ocular motions intact bilaterally V,VII: smile symmetric, facial light touch sensation decreased on left VIII: hearing normal bilaterally IX,X: uvula rises symmetrically XI: bilateral shoulder shrug XII: midline tongue extension without atrophy or fasciculations  Motor: Right : Upper extremity   5/5    Left:      Upper extremity   3/5  Lower extremity   5/5     Lower extremity   4/5 Tone and bulk:normal tone throughout; no atrophy noted Sensory: Pinprick and light touch is decreased on left side. Deep Tendon Reflexes:  Right: Upper Extremity   Left: Upper extremity   biceps (C-5 to C-6) 2/4   biceps (C-5 to C-6) 2/4 tricep (C7) 2/4    triceps (C7) 2/4 Brachioradialis (C6) 2/4  Brachioradialis (C6) 2/4  Lower Extremity Lower Extremity  quadriceps (L-2 to L-4) 2/4   quadriceps (L-2 to L-4) 2/4 Achilles (S1) 2/4   Achilles (S1) 2/4  Plantars: Right: downgoing   Left: downgoing Cerebellar: Mildly abnormal finger-to-nose, and keep her eyes open Gait: Deferred     ASSESSMENT/PLAN [name] is a 65 y.o. 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  Head CT revealed a potential acute infarct in right parietal region.  CTA confirmed an acute right M2 occlusion.  No tPA given due to recent AICD placement.  MRI is contraindicated due to recent pacemaker procedure, per MRI technician need a minimum of 6 weeks from procedure.  Patient is s/p thrombectomy.  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  Code Stroke CT head:  Hypodensity right frontal parietal lobe suspicious for acute infarct. In addition, hyperdense sign at right sylvian fissure MCA  CTA head & neck : Right M2/MCA posterior division branch occlusion with moderate distal branch opacification via leptomeningeal collaterals.   MRI: Contraindicated due to recent pacemaker switch, per MRI technician need a minimum of 6 weeks  from procedure. (09/25/2020)  IR - R M2 inferior division TICI 2c revasc  CT head post op 1/25 - unchanged  CT repeat 10/09/2020: Subacute right frontoparietal and right temporal infarct.  SAH within the right sylvian fissure is unchanged  2D Echo - Pending  ICD interrogation no afib - new ICD from 09/25/20  LDL 65  HgbA1c: 6.1  VTE prophylaxis -SCDs  aspirin 81 mg daily prior to admission, now on No antithrombotic now due to [facility] post op. Consider ASA next week if neuro stable  Therapy recommendations: CIR   Disposition: Pending  nonischemic cardiomyopathy CHF  EF 30 to 35% in 2013  S/p ICD exchange, interrogation no afib   EF improved to 65 to 70% in 2016  Current TTE pending  Recent ICD exchange on 09/25/20  Hypertension  Home meds: Metoprolol 25 mg  Now on labetalol 5 mg PRN, now off Cleviprex . SBP<120-140 for 24 hours, then SBP<160 . Long-term BP goal normotensive   Hyperlipidemia   No statin at  home  LDL 65, goal < 70  Currently LDL at goal, no statin initiated.   Tobacco use disorder  Home meds: Nicotine 21 mg patch daily  Start nicotine 21 mg patch daily  Smoking cessation provided  Pt is willing to quit  Other Stroke Risk Factors  Advanced Age >/= 48   Obesity, Body mass index is 34.87 kg/m., BMI >/= 30 associated with increased stroke risk, recommend weight loss, diet and exercise as appropriate   Hx stroke/TIA: Postpartum stroke in the past with no residual affects   Hospital day # 2  ATTENDING NOTE: I reviewed above note and agree with the assessment and plan. Pt was seen and examined.   No family at bedside. Still has HA but responding to fioricet. Also complain of neck pain likely due to [facility]. On exam, no nuchal rigidity but did complaining of neck pain on neck flexion. CT head pending. Awake alert, orientated to age, place and time. No aphasia or dysarthria, following all simple commands. No gaze palsy, visual filed exam not  quite cooperative but grossly intact bilaterally. Facial symmetrical, tongue midline. Left UE and LE mild giveaway weakness, but with prompt, left UE and LE at least 4+/5 proximal and 4/5 distally. Sensation decreased on the left. FTN grossly intact, gait not tested.  Repeat CT head showed subacute right frontoparietal and right temporal infarct.  SAH within the right sylvian fissure is unchanged. Will consider ASA next week if neuro stable. PT/OT recommend CIR.   Marvel Plan, MD PhD Stroke Neurology 10/09/2020 10:40 PM   To contact Stroke Continuity provider, please refer to [email or web address]. After hours, contact General Neurology

## 2020-10-09 NOTE — Evaluation (Signed)
Speech Language Pathology Evaluation Patient Details Name: Rebecca Singh MRN: [id number] DOB: [birth year] Today's Date: 10/09/2020 Time: 5093-2671 SLP Time Calculation (min) (ACUTE ONLY): 25 min  Problem List:  Patient Active Problem List   Diagnosis Date Noted  . Acute ischemic right MCA stroke (HCC) 10/07/2020  . Middle cerebral artery embolism, right 10/07/2020  . Left sided chest pain 11/16/2017   Past Medical History:  Past Medical History:  Diagnosis Date  . AICD (automatic cardioverter/defibrillator) present   . COPD (chronic obstructive pulmonary disease) (HCC)   . Hypothyroidism   . Myocardial infarction (HCC)   . Pacemaker/defibrillator   . Stroke [facility])    Past Surgical History:  Past Surgical History:  Procedure Laterality Date  . ABDOMINAL HYSTERECTOMY    . CHOLECYSTECTOMY    . INSERT / REPLACE / REMOVE PACEMAKER    . IR PERCUTANEOUS ART THROMBECTOMY/INFUSION INTRACRANIAL INC DIAG ANGIO  10/07/2020  . RADIOLOGY WITH ANESTHESIA N/A 10/07/2020   Procedure: IR WITH ANESTHESIA - CODE STROKE;  Surgeon: Radiologist, Medication, MD;  Location: [facility] OR;  Service: Radiology;  Laterality: N/A;  . THYROIDECTOMY    . TONSILLECTOMY     HPI:  65 yo female with L side weakness admitted for R MCA with revascularization TICI3 on 10/07/20. PMH polysubstance abuse pacemaker L TKA ongoing tobacco abuse, chronic pain, nonischemic cardiomyopathy obses  peripartum CVA with no residual deficits COPD prediabetes   Assessment / Plan / Recommendation Clinical Impression  Pt presents with mild cognitive deficits post CVA. Deficits c/b reduced novel recall, short term memory, reduced executive function skills, decreased mental manipulation, and increased processing time. Pt states she notes a change in thinking skills including memory and concentration. 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 She states concern with current ability to maintain work duties,  requesting information about disability. Notified RN, who is to coordinate with social work for more information. Receptive language and motor speech skills appeared intact. Pt with mild anomia of speech at times but notes attention could be contributing to some word finding difficulties. SLP to follow up to assist cognitive linguistic deficits.    SLP Assessment  SLP Recommendation/Assessment: Patient needs continued Speech Lanaguage Pathology Services SLP Visit Diagnosis: Cognitive communication deficit (R41.841);Attention and concentration deficit    Follow Up Recommendations       Frequency and Duration min 1 x/week  1 week      SLP Evaluation Cognition  Overall Cognitive Status: Impaired/Different from baseline Arousal/Alertness: Awake/alert Orientation Level: Oriented to person;Oriented to place;Disoriented to time Attention: Focused;Alternating Focused Attention: Impaired Focused Attention Impairment: Verbal complex;Functional complex Alternating Attention: Impaired Alternating Attention Impairment: Verbal complex;Functional complex Memory: Impaired Memory Impairment: Decreased recall of new information;Decreased short term memory Awareness: Appears intact Executive Function: Organizing;Self Monitoring;Sequencing Sequencing: Impaired Sequencing Impairment: Verbal complex;Functional complex Organizing: Impaired Organizing Impairment: Verbal complex;Functional complex Self Monitoring: Impaired Safety/Judgment: Appears intact       Comprehension  Auditory Comprehension Overall Auditory Comprehension: Appears within functional limits for tasks assessed    Expression Verbal Expression Overall Verbal Expression: Impaired Initiation: No impairment Repetition: Impaired Level of Impairment: Phrase level Naming: Impairment Divergent: 50-74% accurate Verbal Errors: Perseveration;Aware of errors Interfering Components: Attention Written Expression Dominant Hand: Right    Oral / Motor  Oral Motor/Sensory Function Overall Oral Motor/Sensory Function: Mild impairment Facial ROM: Within Functional Limits Facial Symmetry: Within Functional Limits Facial Strength: Within Functional Limits Facial Sensation: Reduced left;Suspected CN V (Trigeminal) dysfunction Lingual ROM: Within  Functional Limits Lingual Symmetry: Within Functional Limits Velum: Within Functional Limits Mandible: Within Functional Limits Motor Speech Overall Motor Speech: Appears within functional limits for tasks assessed   GO                    Ardyth Gal MA, CCC-SLP Acute Rehabilitation Services   10/09/2020, 12:12 PM

## 2020-10-09 NOTE — Plan of Care (Incomplete)
Still has HA but responding to fioricet. Also complain of neck pain. On exam, no nuchal rigidity but did complaining of neck pain on neck flexion. CT head pending. Awake alert, orientated to age, place and time. No aphasia or dysarthria, following all simple commands. No gaze palsy, visual filed exam not quite cooperative but grossly intact bilaterally. Facial symmetrical, tongue midline. Left UE and LE mild giveaway weakness, but with prompt, left UE and LE at least 4+/5 proximal and 4/5 distally. Sensation decreased on the left. FTN grossly intact, gait not tested.

## 2020-10-09 NOTE — Consult Note (Signed)
Physical Medicine and Rehabilitation Consult Reason for Consult: Left side weakness Referring Physician: Dr.Xu  HPI: Rebecca Singh is a 65 y.o. 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  History taken from chart review and patient.  Patient lives with son and grandchildren/sister and elderly father.  Independent with assistive device.  Mobile home with 3 steps to entry.  She presented on 10/07/2020 with acute left hemiparesis.  Noted blood pressure in the 190s. Admission chemistries unremarkable except glucose 109, creatinine 1.03, SARS coronavirus negative, troponin 24, urine drug screen negative.  CT of the head showed hypodensity right frontal parietal lobe suspicious for acute infarct in addition there was a probable small hyperdense vessel in the right sylvian fissure involving the right MCA.  CT angiogram of head and neck right M2/MCA posterior division branch occlusion with moderate distal branch opacification.  No hemodynamically significant stenosis in the neck.  Patient underwent revascularization per interventional radiology.  Follow-up CT the head showed small volume hyperdensity within the right sylvian fissure reflecting extravasated contrast and/or subarachnoid hemorrhage.  Possible subtle acute infarct changes in the right frontal parietal lobe.  Echocardiogram with ejection fraction of 30-35%, grade 1 diastolic dysfunction.  Neurology consulted with work-up ongoing and considering low-dose aspirin in the upcoming days if neuro remained stable and SAH resolves. Cardiology service consulted to do ICD interrogation for A. fib.  She is currently maintained on a regular consistency diet.  Due to patient's left hemiparesis and decrease in functional mobility physical medicine  rehab consult requested.  Review of Systems  Constitutional: Positive for malaise/fatigue. Negative for chills and fever.  HENT: Negative for hearing loss.   Eyes: Negative for blurred vision and double vision.  Respiratory: Negative for cough and shortness of breath.   Cardiovascular: Negative for chest pain and palpitations.  Gastrointestinal: Positive for constipation. Negative for heartburn, nausea and vomiting.  Genitourinary: Negative for dysuria, flank pain and hematuria.  Musculoskeletal: Positive for myalgias.  Skin: Negative for rash.  Neurological: Positive for sensory change, focal weakness and weakness.  All other systems reviewed and are negative.  Past Medical History:  Diagnosis Date  . AICD (automatic cardioverter/defibrillator) present   . COPD (chronic obstructive pulmonary disease) (HCC)   . Hypothyroidism   . Myocardial infarction (HCC)   . Pacemaker/defibrillator   . Stroke [facility])    Past Surgical History:  Procedure Laterality Date  . ABDOMINAL HYSTERECTOMY    . CHOLECYSTECTOMY    . INSERT / REPLACE / REMOVE PACEMAKER    . IR PERCUTANEOUS ART THROMBECTOMY/INFUSION INTRACRANIAL INC DIAG ANGIO  10/07/2020  . RADIOLOGY WITH ANESTHESIA N/A 10/07/2020   Procedure: IR WITH ANESTHESIA - CODE STROKE;  Surgeon: Radiologist, Medication, MD;  Location: [facility] OR;  Service: Radiology;  Laterality: N/A;  . THYROIDECTOMY    . TONSILLECTOMY     History reviewed. No pertinent family history. Social History:  reports that she has been smoking cigarettes. She has a 15.00 pack-year smoking history. She has never used smokeless tobacco. She reports that she does not drink alcohol and does not use drugs. Allergies:  Allergies  Allergen Reactions  . Codeine Shortness Of Breath  . Aspirin Other (See Comments)    "burns" the patient's stomach  . Latex Other (See Comments)    Makes the "skin peel"   . Other Nausea  Only and Other (See Comments)    Spicy foods- "Upset the  patient's stomach"   Medications Prior to Admission  Medication Sig Dispense Refill  . ibuprofen (ADVIL) 200 MG tablet Take 200-400 mg by mouth every 6 (six) hours as needed (for knee and back pain).    Marland Kitchen NEXIUM 24HR 20 MG capsule Take 20 mg by mouth daily before breakfast.    . aspirin EC 81 MG EC tablet Take 1 tablet (81 mg total) by mouth daily. (Patient not taking: No sig reported)    . cyclobenzaprine (FLEXERIL) 10 MG tablet Take 1 tablet (10 mg total) by mouth 3 (three) times daily as needed. (Patient not taking: No sig reported) 30 tablet 0  . metoprolol tartrate (LOPRESSOR) 25 MG tablet Take 1 tablet (25 mg total) by mouth 2 (two) times daily. (Patient not taking: No sig reported) 30 tablet 0  . nicotine (NICODERM CQ - DOSED IN MG/24 HOURS) 21 mg/24hr patch Place 1 patch (21 mg total) onto the skin daily. (Patient not taking: No sig reported) 28 patch 0  . ondansetron (ZOFRAN-ODT) 4 MG disintegrating tablet Take 1 tablet (4 mg total) by mouth every 8 (eight) hours as needed. (Patient not taking: Reported on 10/07/2020) 20 tablet 0  . oxyCODONE-acetaminophen (PERCOCET/ROXICET) 5-325 MG tablet Take 1-2 tablets by mouth every 6 (six) hours as needed for severe pain. 30 tablet 0  . predniSONE (STERAPRED UNI-PAK 21 TAB) 10 MG (21) TBPK tablet Take 6 pills on day one then decrease by 1 pill each day (Patient not taking: Reported on 10/07/2020) 21 tablet 0    Home: Home Living Family/patient expects to be discharged to:: Private residence Living Arrangements:  (Father, sister, grandchildren) Available Help at Discharge: Available 24 hours/day Type of Home: Mobile home Home Access: Stairs to enter Entrance Stairs-Rails: Right Home Layout: One level Bathroom Shower/Tub: Health visitor: Standard Bathroom Accessibility: Yes Home Equipment: Engineer, production - 2 wheels,Bedside commode Additional Comments: father that is 69 yo lives in the home as well, x 3 boston terriers Psychologist, educational  ( triple D- Devil Dog) [name], grandson 3 granddaughter 44 yo oldest son, sister  Lives With: Family  Functional History: Prior Function Level of Independence: Independent with assistive device(s) Comments: 3 months s/p L TKA- Functional Status:  Mobility: Bed Mobility Overal bed mobility: Needs Assistance Bed Mobility: Rolling,Supine to Sit Rolling: Supervision Supine to sit: Min assist Sit to supine: Max assist General bed mobility comments: pt requires hand over hand to reach for bed rail on L side. Transfers Overall transfer level: Needs assistance Equipment used: Rolling walker (2 wheeled) Transfers: Sit to/from Stand Sit to Stand: Min assist General transfer comment: pt able to power up but with knee flexion and foward posture. pt needs physical (A) to help extend L knee while standing. pt with BIL UE on back of chair to complete static standing. pt noted to have head hands and legs tremor with movement. Ambulation/Gait Ambulation/Gait assistance: Min assist Gait Distance (Feet): 20 Feet (addtional trial of 15 feet) Assistive device: Rolling walker (2 wheeled),1 person hand held assist Gait Pattern/deviations: Decreased dorsiflexion - left,Decreased weight shift to left,Step-to pattern,Decreased stance time - left,Decreased stride length,Decreased step length - right,Decreased step length - left General Gait Details: pt had difficulty grasping walker with L hand and pushed through her forearm on the L side. On intial ambulation utilized walker but on additional trial attempted to progress to 1 person hand held assist. pt reported feeling more comfortable and steady  with Rw and requested to switch back. Gait velocity: reduced Gait velocity interpretation: <1.31 ft/sec, indicative of household ambulator    ADL: ADL Overall ADL's : Needs assistance/impaired Grooming: Moderate assistance,Sitting Upper Body Bathing: Moderate assistance,Bed level Lower Body Bathing: Maximal  assistance,Sit to/from stand Toilet Transfer: +2 for physical assistance,Moderate assistance Toilet Transfer Details (indicate cue type and reason): simulated eob to stand simualted BSC General ADL Comments: pt very guarded and fear of falling  Cognition: Cognition Overall Cognitive Status: Within Functional Limits for tasks assessed Arousal/Alertness: Awake/alert Orientation Level: Oriented X4 Attention: Focused,Alternating Focused Attention: Impaired Focused Attention Impairment: Verbal complex,Functional complex Alternating Attention: Impaired Alternating Attention Impairment: Verbal complex,Functional complex Memory: Impaired Memory Impairment: Decreased recall of new information,Decreased short term memory Awareness: Appears intact Executive Function: Organizing,Self Monitoring,Sequencing Sequencing: Impaired Sequencing Impairment: Verbal complex,Functional complex Organizing: Impaired Organizing Impairment: Verbal complex,Functional complex Self Monitoring: Impaired Safety/Judgment: Appears intact Cognition Arousal/Alertness: Awake/alert Behavior During Therapy: WFL for tasks assessed/performed Overall Cognitive Status: Within Functional Limits for tasks assessed  Blood pressure 127/65, pulse (!) 52, temperature 97.8 F (36.6 C), temperature source Oral, resp. rate 18, height 5\' 6"  (1.676 m), weight 98 kg, SpO2 99 %. Physical Exam Vitals reviewed.  Constitutional:      General: She is not in acute distress.    Appearance: She is obese.  HENT:     Head: Normocephalic and atraumatic.     Right Ear: External ear normal.     Left Ear: External ear normal.     Nose: Nose normal.  Eyes:     General:        Right eye: No discharge.        Left eye: No discharge.     Extraocular Movements: Extraocular movements intact.  Cardiovascular:     Rate and Rhythm: Normal rate and regular rhythm.  Pulmonary:     Effort: Pulmonary effort is normal. No respiratory distress.      Breath sounds: No stridor.  Abdominal:     General: Abdomen is flat. Bowel sounds are normal. There is no distension.  Musculoskeletal:     Cervical back: Normal range of motion and neck supple.     Comments: No edema or tenderness in extremities  Skin:    General: Skin is warm and dry.  Neurological:     Mental Status: She is alert.     Comments: Alert Makes eye contact with examiner.   Oriented to person and place.   Follows simple commands.   Fair awareness of deficits. Motor: RUE: 3/5 proximal distal LUE: 2+/5 proximal distal RLE: 3 -/5 proximal distal LLE: 2 -/5 proximal distal Sensation diminished to light touch in left fingers  Psychiatric:        Mood and Affect: Mood normal.        Behavior: Behavior normal.     No results found for this or any previous visit (from the past 24 hour(s)). CT HEAD WO CONTRAST  Addendum Date: 10/09/2020   ADDENDUM REPORT: 10/09/2020 13:45 ADDENDUM: These results were called by telephone at the time of interpretation on 10/09/2020 at 1:45 pm to [facility] , who verbally acknowledged these results. Electronically Signed   By: Stana Buntinghikanele  Emekauwa M.D.   On: 10/09/2020 13:45   Result Date: 10/09/2020 CLINICAL DATA:  Stroke, follow up EXAM: CT HEAD WITHOUT CONTRAST TECHNIQUE: Contiguous axial images were obtained from the base of the skull through the vertex without intravenous contrast. COMPARISON:  10/07/2020 and prior. FINDINGS: Brain: Hyperdense material along  the right sylvian fissure is unchanged. Hypodense regions involving the right frontoparietal and right temporal lobes are concerning for subacute infarcts. No midline shift, mass lesion or ventriculomegaly. Vascular: No definite hyperdense vessel on this exam. No unexpected calcification. Skull: Negative for fracture or focal lesion. Sinuses/Orbits: Normal orbits. Clear paranasal sinuses. No mastoid effusion. Other: None. IMPRESSION: Subacute right frontoparietal and right temporal  infarcts. Hyperdense material within the right sylvian fissure is unchanged. Electronically Signed: By: Stana Bunting M.D. On: 10/09/2020 13:34   ECHOCARDIOGRAM COMPLETE  Result Date: 10/09/2020    ECHOCARDIOGRAM REPORT   Patient Name:   Rebecca Singh Date of Exam: 10/09/2020 Medical Rec #:  [id number]        Height:       66.0 in Accession #:    [id number]       Weight:       216.0 lb Date of Birth:  Sep 05, 1956        BSA:          2.067 m Patient Age:    64 years         BP:           119/68 mmHg Patient Gender: F                HR:           72 bpm. Exam Location:  Inpatient Procedure: 2D Echo, Cardiac Doppler and Color Doppler Indications:    Stroke 434.91 / I163.9  History:        Patient has prior history of Echocardiogram examinations, most                 recent 11/16/2017. CHF, CAD and Previous Myocardial Infarction,                 Defibrillator, COPD; Risk Factors:Hypertension and Current                 Smoker. Nonischemic cardiomyopathy.  Sonographer:    Leta Jungling RDCS Referring Phys: [id number] SRISHTI L BHAGAT IMPRESSIONS  1. Left ventricular ejection fraction, by estimation, is 30 to 35%. The left ventricle has moderately decreased function. The left ventricle demonstrates global hypokinesis. The left ventricular internal cavity size was mildly dilated. There is mild left ventricular hypertrophy. Left ventricular diastolic parameters are consistent with Grade I diastolic dysfunction (impaired relaxation).  2. Right ventricular systolic function is mildly reduced. The right ventricular size is normal. Tricuspid regurgitation signal is inadequate for assessing PA pressure.  3. Left atrial size was mildly dilated.  4. The mitral valve is normal in structure. No evidence of mitral valve regurgitation. No evidence of mitral stenosis.  5. The aortic valve is tricuspid. Aortic valve regurgitation is mild. No aortic stenosis is present.  6. The inferior vena cava is normal in size with greater  than 50% respiratory variability, suggesting right atrial pressure of 3 mmHg. FINDINGS  Left Ventricle: Left ventricular ejection fraction, by estimation, is 30 to 35%. The left ventricle has moderately decreased function. The left ventricle demonstrates global hypokinesis. The left ventricular internal cavity size was mildly dilated. There is mild left ventricular hypertrophy. Left ventricular diastolic parameters are consistent with Grade I diastolic dysfunction (impaired relaxation). Right Ventricle: The right ventricular size is normal. No increase in right ventricular wall thickness. Right ventricular systolic function is mildly reduced. Tricuspid regurgitation signal is inadequate for assessing PA pressure. Left Atrium: Left atrial size was mildly dilated. Right Atrium: Right atrial size was normal in  size. Pericardium: Trivial pericardial effusion is present. Mitral Valve: The mitral valve is normal in structure. Mild mitral annular calcification. No evidence of mitral valve regurgitation. No evidence of mitral valve stenosis. Tricuspid Valve: The tricuspid valve is normal in structure. Tricuspid valve regurgitation is not demonstrated. Aortic Valve: The aortic valve is tricuspid. Aortic valve regurgitation is mild. No aortic stenosis is present. Pulmonic Valve: The pulmonic valve was normal in structure. Pulmonic valve regurgitation is not visualized. Aorta: The aortic root is normal in size and structure. Venous: The inferior vena cava is normal in size with greater than 50% respiratory variability, suggesting right atrial pressure of 3 mmHg. IAS/Shunts: No atrial level shunt detected by color flow Doppler. Additional Comments: A pacer wire is visualized in the right ventricle.  LEFT VENTRICLE PLAX 2D LVIDd:         5.70 cm      Diastology LVIDs:         4.90 cm      LV e' medial:    3.05 cm/s LV PW:         1.20 cm      LV E/e' medial:  11.1 LV IVS:        1.30 cm      LV e' lateral:   4.24 cm/s LVOT diam:      2.20 cm      LV E/e' lateral: 8.0 LV SV:         52 LV SV Index:   25 LVOT Area:     3.80 cm  LV Volumes (MOD) LV vol d, MOD A2C: 159.0 ml LV vol d, MOD A4C: 196.0 ml LV vol s, MOD A2C: 88.5 ml LV vol s, MOD A4C: 116.0 ml LV SV MOD A2C:     70.5 ml LV SV MOD A4C:     196.0 ml LV SV MOD BP:      70.7 ml LEFT ATRIUM             Index       RIGHT ATRIUM           Index LA diam:        4.50 cm 2.18 cm/m  RA Area:     12.10 cm LA Vol (A2C):   54.6 ml 26.42 ml/m RA Volume:   24.00 ml  11.61 ml/m LA Vol (A4C):   52.3 ml 25.31 ml/m LA Biplane Vol: 54.0 ml 26.13 ml/m  AORTIC VALVE LVOT Vmax:   84.20 cm/s LVOT Vmean:  62.400 cm/s LVOT VTI:    0.136 m  AORTA Ao Root diam: 3.70 cm Ao Asc diam:  3.50 cm MITRAL VALVE MV Area (PHT): 2.13 cm    SHUNTS MV Decel Time: 356 msec    Systemic VTI:  0.14 m MV E velocity: 33.80 cm/s  Systemic Diam: 2.20 cm MV A velocity: 56.10 cm/s MV E/A ratio:  0.60 Marca Ancona MD Electronically signed by Marca Ancona MD Signature Date/Time: 10/09/2020/11:32:51 PM    Final     Assessment/Plan: Diagnosis: Right frontal parietal lobe infarct right sylvian subarachnoid hemorrhage -?  Appears to have bilateral deficits.   Stroke: Continue secondary stroke prophylaxis and Risk Factor Modification listed below:   Antiplatelet therapy:   Blood Pressure Management:  Continue current medication with prn's with permisive HTN per primary team Statin Agent:   Prediabetes management:   Tobacco abuse:   ?  Quadriparesis: fit for orthosis to prevent contractures (resting hand splint for day, wrist cock up splint  at night, PRAFO, etc) PT/OT for mobility, ADL training  Motor recovery: Fluoxetine Labs independently reviewed.  Records reviewed and summated above.  1. Does the need for close, 24 hr/day medical supervision in concert with the patient's rehab needs make it unreasonable for this patient to be served in a less intensive setting? Yes  2. 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 Due to safety, disease management and patient education, does the patient require 24 hr/day rehab nursing? Yes 4. Does the patient require coordinated care of a physician, rehab nurse, therapy disciplines of PT/OT to address physical and functional deficits in the context of the above medical diagnosis(es)? Yes Addressing deficits in the following areas: balance, endurance, locomotion, strength, transferring, bathing, dressing, toileting and psychosocial support 5. Can the patient actively participate in an intensive therapy program of at least 3 hrs of therapy per day at least 5 days per week? Yes 6. The potential for patient to make measurable gains while on inpatient rehab is excellent 7. Anticipated functional outcomes upon discharge from inpatient rehab are min assist  with PT, min assist with OT, n/a with SLP. 8. Estimated rehab length of stay to reach the above functional goals is: 14-18 days. 9. Anticipated discharge destination: Home 10. Overall Rehab/Functional Prognosis: good  RECOMMENDATIONS: This patient's condition is appropriate for continued rehabilitative care in the following setting: CIR if adequate caregiver support available upon discharge. Patient has agreed to participate in recommended program. Yes Note that insurance prior authorization may be required for reimbursement for recommended care.  Comment: Rehab Admissions  Coordinator to follow up.  I have personally performed a face to face diagnostic evaluation, including, but not limited to relevant history and physical exam findings, of this patient and developed relevant assessment and plan.  Additionally, I have reviewed and concur with the physician assistant's documentation above.   Maryla Morrow, MD, ABPMR Mcarthur Rossetti Angiulli, PA-C 10/10/2020

## 2020-10-10 DIAGNOSIS — G894 Chronic pain syndrome: Secondary | ICD-10-CM

## 2020-10-10 DIAGNOSIS — I63511 Cerebral infarction due to unspecified occlusion or stenosis of right middle cerebral artery: Secondary | ICD-10-CM

## 2020-10-10 DIAGNOSIS — Z720 Tobacco use: Secondary | ICD-10-CM

## 2020-10-10 DIAGNOSIS — I609 Nontraumatic subarachnoid hemorrhage, unspecified: Secondary | ICD-10-CM

## 2020-10-10 DIAGNOSIS — Z95810 Presence of automatic (implantable) cardiac defibrillator: Secondary | ICD-10-CM

## 2020-10-10 DIAGNOSIS — I428 Other cardiomyopathies: Secondary | ICD-10-CM

## 2020-10-10 DIAGNOSIS — R7303 Prediabetes: Secondary | ICD-10-CM

## 2020-10-10 MED ORDER — ACETAMINOPHEN 650 MG RE SUPP: 650.0000 mg | RECTAL | Status: DC | PRN

## 2020-10-10 MED ORDER — ACETAMINOPHEN 325 MG PO TABS
650.0000 mg | ORAL_TABLET | ORAL | Status: DC | PRN
  Administered 2020-10-11 – 2020-10-13 (×3): 650 mg via ORAL
  Filled 2020-10-10 (×3): qty 2

## 2020-10-10 MED ORDER — ONDANSETRON 4 MG PO TBDP
4.0000 mg | ORAL_TABLET | Freq: Three times a day (TID) | ORAL | Status: DC | PRN
  Administered 2020-10-10: 4 mg via ORAL
  Filled 2020-10-10: qty 1

## 2020-10-10 MED ORDER — ACETAMINOPHEN 160 MG/5ML PO SOLN
650.0000 mg | ORAL | Status: DC | PRN
  Administered 2020-10-12: 650 mg via ORAL
  Filled 2020-10-10: qty 20.3

## 2020-10-10 MED ORDER — PROMETHAZINE HCL 25 MG/ML IJ SOLN
12.5000 mg | Freq: Once | INTRAMUSCULAR | Status: AC
  Administered 2020-10-10: 12.5 mg via INTRAVENOUS
  Filled 2020-10-10: qty 1

## 2020-10-10 MED ORDER — PANTOPRAZOLE SODIUM 40 MG PO TBEC
40.0000 mg | DELAYED_RELEASE_TABLET | Freq: Every day | ORAL | Status: DC
  Administered 2020-10-10 – 2020-10-13 (×4): 40 mg via ORAL
  Filled 2020-10-10 (×4): qty 1

## 2020-10-10 NOTE — PMR Pre-admission (Signed)
PMR Admission Coordinator Pre-Admission Assessment  Patient: Rebecca Singh is an 65 y.o., female MRN: [id number] DOB: 03/27/1956 Height: 5\' 6"  (167.6 cm) Weight: 98 kg              Insurance Information HMO: yes    PPO:      PCP:      IPA:      80/20:      OTHER:  PRIMARY: [employer]      Policy#:      Subscriber: pt CM Name: [id number]     Phone#: [phone] ext [phone]   Fax#: [phone] Pre-Cert#: [id number]   Approved for 7 days   Employer:  Benefits:  Phone #: [phone]     Name: 1/28 Eff. Date: 05/14/2020     Deduct: $233      Out of Pocket Max: $3450      Life Max: none  CIR: With Medicaid dual coverage should be covered 100%; otherwise $2524 co pay per admission if Medicaid non coverage      SNF: 100% Outpatient: 80%     Co-Pay: visits per medical neccesity Home Health: 100%      Co-Pay: visits per medical neccesity DME: 80%     Co-Pay: 20% Providers: in network  SECONDARY:  Medicaid [employer]      Policy#: 07/14/2020      Phone#:   Financial Counselor:       Phone#:   The "Data Collection Information Summary" for patients in Inpatient Rehabilitation Facilities with attached "Privacy Act Statement-Health Care Records" was provided and verbally reviewed with: Patient  Emergency Contact Information Contact Information    Name Relation Home Work Mobile   Zena   [phone]   [phone] Sister   [phone]     Current Medical History  Patient Admitting Diagnosis: CVA  History of Present Illness: Rebecca Singh is a 65 year old right-handed female with history of COPD/tobacco abuse, polysubstance use in the past, chronic pain, 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 Per chart review patient lives with son and grandchildren sister and elderly father. Independent with  assistive device. Mobile home 3 steps to entry. She presented 10/07/2020 with acute onset of left-sided weakness. Noted blood pressure in the 190's. Admission chemistries unremarkable except glucose 109 creatinine 1.03, SARS coronavirus negative, troponin XX 4 urine drug screen negative. CT of the head showed hypodensity right frontal parietal lobe suspicious for acute infarction in addition there was a probable small hyperdense vessel in the right sylvian fissure involving the right MCA. CT angiogram of head and neck right M2/MCA posterior division branch occlusion with moderate distal branch opacification. No hemodynamically significant stenosis in the neck. Patient underwent revascularization per interventional radiology. Follow-up CT of the head showed small volume hyper density within the right sylvian fissure reflecting extravasated contrast and/or subarachnoid hemorrhage. Possible subtle acute infarct changes in the right frontal parietal lobe. Echocardiogram with ejection fraction of 30 to 35% grade 1 diastolic dysfunction. There was consideration of aspirin therapy currently on no antithrombotic due to small SAH awaiting plan for aspirin therapy with follow-up CT of the head completed 10/13/2020 showing continued interval evolution of patchy right MCA distribution infarct stable in size as compared to previous exam. No evidence for hemorrhagic transformation or significant regional mass-effect. Persistent but decreased hyper density within the right sylvian fissure likely a small amount of subarachnoid hemorrhage.  No other acute abnormality.. Cardiology services consulted for both ICD interrogation for A. fib as well as nonspecific chest discomfort. EKG completed similar to distant prior EKG's.BIV rhythm 70s.. EKG consistent with myopericarditis for which she takes high-dose nonsteroidal's and colchicine in the past and currently completing a course of Solu-Medrol. Troponin was completed 24-17-20-22. Chest  discomfort felt to be more pleuritic in nature. Cardiology did sign off no further work-up currently indicated. She is tolerating a regular consistency diet  Complete NIHSS TOTAL: 4 Glasgow Coma Scale Score: 15  Past Medical History  Past Medical History:  Diagnosis Date  . AICD (automatic cardioverter/defibrillator) present   . COPD (chronic obstructive pulmonary disease) (HCC)   . Hypothyroidism   . Myocardial infarction (HCC)   . Pacemaker/defibrillator   . Stroke [facility](HCC)     Family History  family history is not on file.  Prior Rehab/Hospitalizations:  Has the patient had prior rehab or hospitalizations prior to admission? Yes  Has the patient had major surgery during 100 days prior to admission? No  Current Medications   Current Facility-Administered Medications:  .   stroke: mapping our early stages of recovery book, , Does not apply, Once, Bhagat, Srishti L, MD .  acetaminophen (TYLENOL) tablet 650 mg, 650 mg, Oral, Q4H PRN, 650 mg at 10/13/20 0806 **OR** acetaminophen (TYLENOL) 160 MG/5ML solution 650 mg, 650 mg, Oral, Q4H PRN, 650 mg at 10/12/20 1849 **OR** acetaminophen (TYLENOL) suppository 650 mg, 650 mg, Rectal, Q4H PRN, Marvel PlanXu, Jindong, MD .  aspirin EC tablet 81 mg, 81 mg, Oral, Daily, Dagar, Anjali, MD .  butalbital-acetaminophen-caffeine (FIORICET) 50-325-40 MG per tablet 2 tablet, 2 tablet, Oral, Q6H PRN, Beryle Beamsoonquah, Kofi, MD, 2 tablet at 10/13/20 0544 .  Chlorhexidine Gluconate Cloth 2 % PADS 6 each, 6 each, Topical, Daily, Bhagat, Srishti L, MD, 6 each at 10/13/20 1000 .  colchicine tablet 0.6 mg, 0.6 mg, Oral, BID, Dagar, Anjali, MD, 0.6 mg at 10/13/20 1102 .  labetalol (NORMODYNE) injection 5-20 mg, 5-20 mg, Intravenous, Q2H PRN, Marvel PlanXu, Jindong, MD .  methylPREDNISolone sodium succinate (SOLU-MEDROL) 250 mg in sodium chloride 0.9 % 50 mL IVPB, 250 mg, Intravenous, Q12H, [place], Kofi, MD, Last Rate: 104 mL/hr at 10/13/20 0818, 250 mg at 10/13/20 0818 .  nicotine  (NICODERM CQ - dosed in mg/24 hours) patch 21 mg, 21 mg, Transdermal, Daily, Dagar, Anjali, MD, 21 mg at 10/13/20 1024 .  ondansetron (ZOFRAN-ODT) disintegrating tablet 4 mg, 4 mg, Oral, Q8H PRN, Dagar, Anjali, MD, 4 mg at 10/10/20 1210 .  pantoprazole (PROTONIX) EC tablet 40 mg, 40 mg, Oral, Daily, Dagar, Anjali, MD, 40 mg at 10/13/20 1024 .  phenol (CHLORASEPTIC) mouth spray 1 spray, 1 spray, Mouth/Throat, PRN, Marvel PlanXu, Jindong, MD .  senna-docusate (Senokot-S) tablet 1 tablet, 1 tablet, Oral, QHS PRN, Bhagat, Srishti L, MD .  sodium chloride flush (NS) 0.9 % injection 3 mL, 3 mL, Intravenous, Once, Cathren LaineSteinl, Kevin, MD .  topiramate (TOPAMAX) tablet 25 mg, 25 mg, Oral, BID, Dagar, Geralynn RileAnjali, MD  Patients Current Diet:  Diet Order            Diet Heart Room service appropriate? Yes with Assist; Fluid consistency: Thin  Diet effective now                 Precautions / Restrictions Precautions Precautions: Fall Precaution Comments: L inattention Restrictions Weight Bearing Restrictions: No   Has the patient had 2 or more falls or a fall with injury in the past year?No  Prior  Activity Level Community (5-7x/wk): Independent, driving, working part time in food prep  Prior Functional Level Prior Function Level of Independence: Independent with assistive device(s) Comments: 3 months s/p L TKA-  Self Care: Did the patient need help bathing, dressing, using the toilet or eating?  Independent  Indoor Mobility: Did the patient need assistance with walking from room to room (with or without device)? Independent  Stairs: Did the patient need assistance with internal or external stairs (with or without device)? Independent  Functional Cognition: Did the patient need help planning regular tasks such as shopping or remembering to take medications? Independent  Home Assistive Devices / Equipment Home Assistive Devices/Equipment: None Home Equipment: Shower seat,Walker - 2 wheels,Bedside  commode  Prior Device Use: Indicate devices/aids used by the patient prior to current illness, exacerbation or injury? None of the above  Current Functional Level Cognition  Arousal/Alertness: Awake/alert Overall Cognitive Status: Impaired/Different from baseline Orientation Level: Oriented X4 Safety/Judgement: Decreased awareness of safety,Decreased awareness of deficits General Comments: slow to process cues for overall seems WFL Attention: Focused,Alternating Focused Attention: Impaired Focused Attention Impairment: Verbal complex,Functional complex Alternating Attention: Impaired Alternating Attention Impairment: Verbal complex,Functional complex Memory: Impaired Memory Impairment: Decreased recall of new information,Decreased short term memory Awareness: Appears intact Executive Function: Organizing,Self Monitoring,Sequencing Sequencing: Impaired Sequencing Impairment: Verbal complex,Functional complex Organizing: Impaired Organizing Impairment: Verbal complex,Functional complex Self Monitoring: Impaired Safety/Judgment: Appears intact    Extremity Assessment (includes Sensation/Coordination)  Upper Extremity Assessment: LUE deficits/detail,RUE deficits/detail RUE Deficits / Details: fingers numb and tingling, reports normal sensation at elbow to shoulder reports dropping thing that she thinks she is holding. pt states "I am just clumspy" LUE Deficits / Details: fingers numb and tingling, reports normal sensation at elbow to shoulder LUE Sensation: decreased light touch LUE Coordination: decreased fine motor,decreased gross motor  Lower Extremity Assessment: LLE deficits/detail,RLE deficits/detail RLE Deficits / Details: grossly 3 to 3+ with incoordination, jerks/tremulous RLE Coordination: decreased fine motor LLE Deficits / Details: numb and tingling at feet, grossly 2+/5 large muscle groups.  incoordination.  ? motor apraxias LLE Coordination: decreased fine  motor,decreased gross motor    ADLs  Overall ADL's : Needs assistance/impaired Grooming: Wash/dry hands,Standing,Oral care,Min guard,Cueing for compensatory techniques Grooming Details (indicate cue type and reason): pt able to stand at sink for UB grooming tasks, education provided on compensatory strategies for using LUE as stabilizer and incorporating LUE as much as possible into ADL routine Upper Body Bathing: Moderate assistance,Bed level Lower Body Bathing: Supervison/ safety,Sitting/lateral leans Lower Body Bathing Details (indicate cue type and reason): simulated via pericare Toilet Transfer: Minimal assistance,RW,Ambulation,BSC,Regular Toilet,Grab bars Toilet Transfer Details (indicate cue type and reason): BSC over regular toilet, cues for grab bars, MIN A for functional mobility with Rw Toileting- Clothing Manipulation and Hygiene: Supervision/safety,Sitting/lateral lean Toileting - Clothing Manipulation Details (indicate cue type and reason): anterior pericare from toilet Functional mobility during ADLs: Minimal assistance,Rolling walker General ADL Comments: pt continues to present with decreased strength and coodination in LUE, impaired balance and decreased activity tolerance    Mobility  Overal bed mobility: Needs Assistance Bed Mobility: Supine to Sit,Sit to Supine Rolling: Supervision Supine to sit: Supervision,HOB elevated Sit to supine: Supervision,HOB elevated General bed mobility comments: HOB elevated to 37* supervision for safety and ling mgmt    Transfers  Overall transfer level: Needs assistance Equipment used: Rolling walker (2 wheeled) Transfers: Sit to/from Stand Sit to Stand: Min guard,Min assist General transfer comment: MIN A for EOB needing cues for hand placement, MIN guard  from [facility] over toilet. issued pt walker splint at end of session for LUE as pt presents with decreased grip.    Ambulation / Gait / Stairs / Wheelchair Mobility   Ambulation/Gait Ambulation/Gait assistance: Editor, commissioning (Feet): 20 Feet (x2 bouts) Assistive device: Rolling walker (2 wheeled),1 person hand held assist Gait Pattern/deviations: Decreased dorsiflexion - left,Decreased weight shift to left,Decreased stance time - left,Decreased stride length,Decreased step length - right,Decreased step length - left,Step-through pattern,Decreased dorsiflexion - right,Shuffle,Trunk flexed,Wide base of support General Gait Details: pt had difficulty grasping walker with L hand and pushed through her forearm on the L side. Required repeated cues to attend to L hand and correct positioning or to attend to L side of RW or L foot to avoid obstaceles. Cued pt to increase bil feet clearance due to bil foot drag, min-no success. Gait velocity: reduced Gait velocity interpretation: <1.31 ft/sec, indicative of household ambulator    Posture / Balance Balance Overall balance assessment: Needs assistance Sitting-balance support: No upper extremity supported,Feet supported Sitting balance-Leahy Scale: Good Standing balance support: During functional activity,No upper extremity supported Standing balance-Leahy Scale: Poor Standing balance comment: pt able to complete functional tasks at sink with no UE support with min guard for balance    Special needs/care consideration Hgb A1c 6.1 Designated visitor is Diane AICD   Previous Home Environment  Living Arrangements:  (Father, sister, grandchildren)  Lives With: Family Available Help at Discharge: Available 24 hours/day Type of Home: Mobile home Home Layout: One level Home Access: Stairs to enter Entrance Stairs-Rails: Right Bathroom Shower/Tub: Health visitor: Standard Bathroom Accessibility: Yes How Accessible: Accessible via walker Home Care Services: No Additional Comments: father that is 70 yo lives in the home as well, x 3 boston terriers Psychologist, educational ( triple D- Devil Dog) [name],  grandson 66 granddaughter 72 yo oldest son, sister  Discharge Living Setting Plans for Discharge Living Setting: Patient's home,Lives with (comment),Mobile Home (father, sister, son and grandchildren) Type of Home at Discharge: Mobile home Discharge Home Layout: One level Discharge Home Access: Stairs to enter Entrance Stairs-Rails: Right Entrance Stairs-Number of Steps: 4 to 5 Discharge Bathroom Shower/Tub: Walk-in shower Discharge Bathroom Toilet: Standard Discharge Bathroom Accessibility: Yes How Accessible: Accessible via walker Does the patient have any problems obtaining your medications?: No  Social/Family/Support Systems Contact Information: Arlys John, son Anticipated Caregiver: family Anticipated Caregiver's Contact Information: see above Ability/Limitations of Caregiver: family in and out throughout the day Caregiver Availability: 24/7 Discharge Plan Discussed with Primary Caregiver: Yes Is Caregiver In Agreement with Plan?: No Does Caregiver/Family have Issues with Lodging/Transportation while Pt is in Rehab?: No  Goals Patient/Family Goal for Rehab: Mod I to supervision with PT and OT Expected length of stay: ELOS 7 to 10 days Pt/Family Agrees to Admission and willing to participate: Yes Program Orientation Provided & Reviewed with Pt/Caregiver Including Roles  & Responsibilities: Yes  Decrease burden of Care through IP rehab admission: n/a  Possible need for SNF placement upon discharge:not anticipated  Patient Condition: This patient's medical and functional status has changed since the consult dated: min assist overall in which the Rehabilitation Physician determined and documented that the patient's condition is appropriate for intensive rehabilitative care in an inpatient rehabilitation facility. See "History of Present Illness" (above) for medical update. Functional changes are: overall min assist. Patient's medical and functional status update has been discussed with  the Rehabilitation physician and patient remains appropriate for inpatient rehabilitation. Will admit to inpatient rehab today.  Preadmission Screen  Completed By:  Clois Dupes, RN, 10/13/2020 11:31 AM ______________________________________________________________________   Discussed status with Dr. Riley Kill on 10/13/2020 at  1132 and received approval for admission today.  Admission Coordinator:  [place], [place], time 4332 Date 10/13/2020

## 2020-10-10 NOTE — Progress Notes (Signed)
Physical Therapy Treatment Patient Details Name: Rebecca Singh MRN: [id number] DOB: 10-12-1955 Today's Date: 10/10/2020    History of Present Illness 65 yo female with L side weakness admitted for R MCA with revascularization TICI3 on 10/07/20. PMH polysubstance abuse pacemaker L TKA ongoing tobacco abuse, chronic pain, nonischemic cardiomyopathy obses  peripartum CVA with no residual deficits COPD prediabetes    PT Comments    Pt progressing towards her goals, ambulating an increased distance this date. However, her decreased awareness of her deficits and L side neglect impact her safety, placing her at risk for injury and falls. For example, despite repeated multi-modal cues pt continued to shuffle her feet placing her at risk for tripping. In addition, she would leave her L arm behind or bump her L side of the RW or her L leg into obstacles, needing cues to attend to them and correct for safety purposes. Will continue to follow acutely. Current recommendations remain appropriate.  Follow Up Recommendations  CIR     Equipment Recommendations  3in1 (PT) (hemiwalker, if pt discharges home today)    Recommendations for Other Services       Precautions / Restrictions Precautions Precautions: Fall Precaution Comments: L inattention Restrictions Weight Bearing Restrictions: No    Mobility  Bed Mobility Overal bed mobility: Needs Assistance Bed Mobility: Supine to Sit     Supine to sit: Min guard;HOB elevated     General bed mobility comments: Pt able to initiate leg movement off bed and was provided cues to push up on L arm, success with min guard and HOB elevated. Extra time to square hips with EOB.  Transfers Overall transfer level: Needs assistance Equipment used: Rolling walker (2 wheeled) Transfers: Sit to/from Stand Sit to Stand: Min assist         General transfer comment: Cued pt to push up from current surface 1x from bed and 1x from commode. Assistance and  cues to attend to L hand for placement. Tactile cues at L quad to extend knee.  Ambulation/Gait Ambulation/Gait assistance: Min assist Gait Distance (Feet): 20 Feet (x2 bouts) Assistive device: Rolling walker (2 wheeled);1 person hand held assist Gait Pattern/deviations: Decreased dorsiflexion - left;Decreased weight shift to left;Decreased stance time - left;Decreased stride length;Decreased step length - right;Decreased step length - left;Step-through pattern;Decreased dorsiflexion - right;Shuffle;Trunk flexed;Wide base of support Gait velocity: reduced Gait velocity interpretation: <1.31 ft/sec, indicative of household ambulator General Gait Details: pt had difficulty grasping walker with L hand and pushed through her forearm on the L side. Required repeated cues to attend to L hand and correct positioning or to attend to L side of RW or L foot to avoid obstaceles. Cued pt to increase bil feet clearance due to bil foot drag, min-no success.   Stairs             Wheelchair Mobility    Modified Rankin (Stroke Patients Only) Modified Rankin (Stroke Patients Only) Pre-Morbid Rankin Score: No symptoms Modified Rankin: Moderately severe disability     Balance Overall balance assessment: Needs assistance Sitting-balance support: No upper extremity supported;Feet supported Sitting balance-Leahy Scale: Good     Standing balance support: Bilateral upper extremity supported;Single extremity supported Standing balance-Leahy Scale: Poor Standing balance comment: Varying between 1-2 UE support and min guard-minA for balance.                            Cognition Arousal/Alertness: Awake/alert Behavior During Therapy: WFL for tasks  assessed/performed Overall Cognitive Status: Impaired/Different from baseline Area of Impairment: Awareness;Problem solving;Safety/judgement;Memory                     Memory: Decreased recall of precautions;Decreased short-term  memory   Safety/Judgement: Decreased awareness of safety;Decreased awareness of deficits Awareness: Emergent Problem Solving: Slow processing;Difficulty sequencing;Requires verbal cues General Comments: Pt required repeated cues to remain attentive to L side, but still leaving or neglecting L side often. Extra time to process cues and acknowledge safety concerns.      Exercises      General Comments        Pertinent Vitals/Pain Pain Assessment: 0-10 Pain Score: 7  Pain Location: headache Pain Descriptors / Indicators: Headache Pain Intervention(s): Limited activity within patient's tolerance;Monitored during session;Repositioned;Patient requesting pain meds-RN notified    Home Living                      Prior Function            PT Goals (current goals can now be found in the care plan section) Acute Rehab PT Goals Patient Stated Goal: to get better PT Goal Formulation: With patient Time For Goal Achievement: 10/22/20 Potential to Achieve Goals: Good Progress towards PT goals: Progressing toward goals    Frequency    Min 4X/week      PT Plan Current plan remains appropriate    Co-evaluation              AM-PAC PT "6 Clicks" Mobility   Outcome Measure  Help needed turning from your back to your side while in a flat bed without using bedrails?: A Little Help needed moving from lying on your back to sitting on the side of a flat bed without using bedrails?: A Little Help needed moving to and from a bed to a chair (including a wheelchair)?: A Little Help needed standing up from a chair using your arms (e.g., wheelchair or bedside chair)?: A Little Help needed to walk in hospital room?: A Little Help needed climbing 3-5 steps with a railing? : A Lot 6 Click Score: 17    End of Session Equipment Utilized During Treatment: Gait belt Activity Tolerance: Patient tolerated treatment well Patient left: in chair;with call bell/phone within reach;with  chair alarm set Nurse Communication: Patient requests pain meds PT Visit Diagnosis: Unsteadiness on feet (R26.81);Other abnormalities of gait and mobility (R26.89);Hemiplegia and hemiparesis;Muscle weakness (generalized) (M62.81);Difficulty in walking, not elsewhere classified (R26.2);Other symptoms and signs involving the nervous system (R29.898) Hemiplegia - Right/Left: Left Hemiplegia - dominant/non-dominant: Non-dominant     Time: 1441-1501 PT Time Calculation (min) (ACUTE ONLY): 20 min  Charges:  $Gait Training: 8-22 mins                     Raymond Gurney, PT, DPT Acute Rehabilitation Services  Pager: [phone] Office: [phone]    Jewel Baize 10/10/2020, 3:17 PM

## 2020-10-10 NOTE — Progress Notes (Addendum)
STROKE TEAM PROGRESS NOTE   INTERVAL HISTORY No acute overnight events.   Patient evaluated at bedside this morning, no family present in the room.  Patient complained of nausea this morning, provided with Zofran as needed as well as Protonix daily.  Patient's headache is better than yesterday.  She is informed about her low EF on echocardiogram yesterday and she states that she does not want to follow-up with her outpatient cardiologist and would like to establish new here at [facility].  Cardiology consult has been done, appreciate their assistance.  Patient sister is called and updated about her current condition.  Patient is still waiting for CIR bed.  Blood pressure well controlled.  Neurological exam is improving.  Plan is to repeat CT scan on 10/13/2020.  Vitals:   10/09/20 2041 10/10/20 0021 10/10/20 0412 10/10/20 0851  BP: 114/70 133/87 127/87 (!) 146/71  Pulse: 62 (!) 51 (!) 59 (!) 56  Resp: 17 16 20 18   Temp: 98.1 F (36.7 C) 97.7 F (36.5 C) 98 F (36.7 C) 97.9 F (36.6 C)  TempSrc: Oral Oral Oral Oral  SpO2: 98% 96% 98% 100%  Weight:      Height:       CBC:  Recent Labs  Lab 10/07/20 0953 10/07/20 0959 10/08/20 0443  WBC 6.3  --  8.5  NEUTROABS 3.7  --  7.0  HGB 14.6 15.0 12.6  HCT 46.2* 44.0 39.6  MCV 91.1  --  90.0  PLT 288  --  265   Basic Metabolic Panel:  Recent Labs  Lab 10/07/20 0953 10/07/20 0959 10/08/20 0443  NA 138 139 137  K 4.4 4.3 4.0  CL 107 107 109  CO2 22  --  19*  GLUCOSE 109* 101* 105*  BUN 29* 33* 22  CREATININE 1.03* 1.00 0.84  CALCIUM 9.3  --  8.7*   Lipid Panel:  Recent Labs  Lab 10/08/20 0443  CHOL 124  TRIG 52  HDL 49  CHOLHDL 2.5  VLDL 10  LDLCALC 65   HgbA1c:  Recent Labs  Lab 10/08/20 0443  HGBA1C 6.1*   Urine Drug Screen:  Recent Labs  Lab 10/07/20 1719  LABOPIA NONE DETECTED  COCAINSCRNUR NONE DETECTED  LABBENZ NONE DETECTED  AMPHETMU NONE DETECTED  THCU NONE DETECTED  LABBARB NONE DETECTED      IMAGING past 24 hours  Ct Head wo contrast 10/10/2019   IMPRESSION: Subacute right frontoparietal and right temporal infarcts.  Hyperdense material within the right sylvian fissure is unchanged.  CT Head Code stroke wo contrast 10/07/2020  IMPRESSION: 1. Hypodensity right frontal parietal lobe suspicious for acute infarct given the symptoms. In addition, there is a probable small hyperdense vessel in the right sylvian fissure involving the right middle cerebral artery. 2. ASPECTS is 9   CT Angio Head Neck w or wo contrast 10/07/2020  IMPRESSION: 1. Right M2/MCA posterior division branch occlusion with moderate distal branch opacification via leptomeningeal collaterals. 2. No hemodynamically significant stenosis in the neck.  Echocardiogram 10/09/2020  IMPRESSIONS    1. Left ventricular ejection fraction, by estimation, is 30 to 35%. The  left ventricle has moderately decreased function. The left ventricle  demonstrates global hypokinesis. The left ventricular internal cavity size  was mildly dilated. There is mild  left ventricular hypertrophy. Left ventricular diastolic parameters are  consistent with Grade I diastolic dysfunction (impaired relaxation).  2. Right ventricular systolic function is mildly reduced. The right  ventricular size is normal. Tricuspid regurgitation signal  is inadequate  for assessing PA pressure.  3. Left atrial size was mildly dilated.  4. The mitral valve is normal in structure. No evidence of mitral valve  regurgitation. No evidence of mitral stenosis.  5. The aortic valve is tricuspid. Aortic valve regurgitation is mild. No  aortic stenosis is present.  6. The inferior vena cava is normal in size with greater than 50%  respiratory variability, suggesting right atrial pressure of 3 mmHg.   PHYSICAL EXAM  General: Elderly female lying comfortably in bed, NAD HEENT: [place]/AT, MMM, PERRLA Cardiovascular: Regular rate and  rhythm Respiratory: No respiratory distress Gastrointestinal: Soft, no rigidity no, no tenderness Extremities: No peripheral edema Psych: Normal mood and affect Neurological:  Mental Status: Alert, oriented, thought content appropriate.  Speech fluent without evidence of aphasia except cannot spell the word world backwards.  Able to follow 3 step commands without difficulty. Cranial Nerves: II:  Visual fields grossly normal, pupils equal, round, reactive to light and accommodation, noncooperation III,IV, VI: ptosis not present, extra-ocular motions intact bilaterally V,VII: smile symmetric, facial light touch sensation decreased on left VIII: hearing normal bilaterally IX,X: uvula rises symmetrically XI: bilateral shoulder shrug XII: midline tongue extension without atrophy or fasciculations  Motor: Right : Upper extremity   5/5    Left:     Upper extremity   3/5  Lower extremity   5/5     Lower extremity   4/5 Tone and bulk:normal tone throughout; no atrophy noted Sensory: Pinprick and light touch is decreased on left side. Deep Tendon Reflexes:  Right: Upper Extremity   Left: Upper extremity   biceps (C-5 to C-6) 2/4   biceps (C-5 to C-6) 2/4 tricep (C7) 2/4    triceps (C7) 2/4 Brachioradialis (C6) 2/4  Brachioradialis (C6) 2/4  Lower Extremity Lower Extremity  quadriceps (L-2 to L-4) 2/4   quadriceps (L-2 to L-4) 2/4 Achilles (S1) 2/4   Achilles (S1) 2/4  Plantars: Right: downgoing   Left: downgoing Cerebellar: Mildly abnormal finger-to-nose, and keep her eyes open Gait: Deferred     ASSESSMENT/PLAN Rebecca Singh is a 65 y.o. 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  Head CT revealed a potential acute infarct in right parietal region.  CTA confirmed an acute right M2 occlusion.  No tPA given due to recent AICD placement.  MRI is contraindicated due to recent pacemaker procedure, per MRI technician need a minimum of 6 weeks from procedure.  Patient is s/p thrombectomy.  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  Code Stroke CT head:  Hypodensity right frontal parietal lobe suspicious for acute infarct. In addition, hyperdense sign at right sylvian fissure MCA  CTA head & neck : Right M2/MCA posterior division branch occlusion with moderate distal branch opacification via leptomeningeal collaterals.   MRI: Contraindicated due to recent pacemaker switch, per MRI technician need a minimum of 6 weeks from procedure. (09/25/2020)  IR - R M2 inferior division TICI 2c revasc  CT head post op 1/25 - unchanged  CT repeat 10/09/2020: Subacute right frontoparietal and right temporal infarct.  SAH within the right sylvian fissure is unchanged  2D Echo -  EF 30 to 35%.   ICD interrogation no afib - new ICD from 09/25/20  LDL 65  HgbA1c: 6.1  VTE prophylaxis -SCDs  aspirin 81 mg daily prior to admission, now on No antithrombotic now due to [facility] post op. Consider ASA next week after CT repeat.   Therapy recommendations: CIR   Disposition: Pending  nonischemic cardiomyopathy CHF  EF 30 to 35% in 2013  S/p ICD exchange, interrogation no afib   EF improved to 65 to 70% in 2016  Current TTE EF 30 to 35%.   Recent ICD exchange on 09/25/20  Will call cardiology consult - pt would like to follow up with [facility] cardiology now  Hypertension  Home meds: Metoprolol 25 mg  Now on labetalol 5 mg PRN, now off Cleviprex . BP stable . Long-term BP goal normotensive   Hyperlipidemia   No statin at home  LDL 65, goal < 70  Currently LDL  at goal, no statin initiated.   Tobacco use disorder  Home meds: Nicotine 21 mg patch daily  Start nicotine 21 mg patch daily  Smoking cessation provided  Pt is willing to quit  Other Stroke Risk Factors  Advanced Age >/= 59   Obesity, Body mass index is 34.87 kg/m., BMI >/= 30 associated with increased stroke risk, recommend weight loss, diet and exercise as appropriate   Hx stroke/TIA: Postpartum stroke in the past with no residual affects   Hospital day # 3  ATTENDING NOTE: I reviewed above note and agree with the assessment and plan. Pt was seen and examined.   No family at bedside, no acute event overnight. However, pt complain of abdominal discomfort and nausea this am, will given zofran PRN. Pt still has left UE and LE mild hemiparesis and hemiparesthesia. PT/OT recommend CIR. Plan for next week admit.   TTE showed EF 30-35%, decreased from 2016. Pt stated that she would like to follow up with [facility] cardiology from now on, will call cardiology consult to establish care. Repeat CT head yesterday showed subacute right frontoparietal and right temporal infarct.  SAH within the right sylvian fissure is unchanged. Will consider ASA after repeat CT next week.   Marvel Plan, MD PhD Stroke Neurology 10/10/2020 11:30 AM   To contact Stroke Continuity provider, please refer to [email or web address]. After hours, contact General Neurology

## 2020-10-10 NOTE — Progress Notes (Signed)
Inpatient Rehabilitation Admissions Coordinator  I met at bedside with patient for rehab assessment. We discussed goals and expectations of a possible inpt rehab admit. She is in agreement and her preference is for CIR. I will begin insurance authorization with [place] for a possible CIR admit. There are no planned discharges over the weekend at [facility]. I will follow up on Monday. Discussed with Dr. Erlinda Hong.  Danne Baxter, RN, MSN Rehab Admissions Coordinator [phone] 10/10/2020 12:35 PM

## 2020-10-11 MED ORDER — BUTALBITAL-APAP-CAFFEINE 50-325-40 MG PO TABS
2.0000 | ORAL_TABLET | Freq: Four times a day (QID) | ORAL | Status: DC | PRN
  Administered 2020-10-11 – 2020-10-13 (×3): 2 via ORAL
  Filled 2020-10-11 (×3): qty 2

## 2020-10-11 NOTE — Plan of Care (Signed)
  Problem: Ischemic Stroke/TIA Tissue Perfusion: Goal: Complications of ischemic stroke/TIA will be minimized Outcome: Progressing   Problem: Education: Goal: Knowledge of General Education information will improve Description: Including pain rating scale, medication(s)/side effects and non-pharmacologic comfort measures Outcome: Progressing   Problem: Clinical Measurements: Goal: Ability to maintain clinical measurements within normal limits will improve Outcome: Progressing Goal: Will remain free from infection Outcome: Progressing Goal: Diagnostic test results will improve Outcome: Progressing Goal: Respiratory complications will improve Outcome: Progressing Goal: Cardiovascular complication will be avoided Outcome: Progressing

## 2020-10-11 NOTE — Progress Notes (Addendum)
STROKE TEAM PROGRESS NOTE   INTERVAL HISTORY No acute overnight events. Patient evaluated at bedside this morning, no family present in the room.  Patient still complains of headache but states has not taken any medication to help with it.  Patient denies any nausea or GI distress this morning and was working on her breakfast.  She denies any new complaints.  Blood pressure well controlled.  Neurologically she is improving.  Cardiology was consulted yesterday, waiting for their assistance.  Patient is waiting for CIR bed.  Plan is to repeat CT scan on 10/13/2020 and then decide to start ASA.  Vitals:   10/10/20 2012 10/10/20 2353 10/11/20 0407 10/11/20 0810  BP: 117/84 120/67 129/80 129/66  Pulse: (!) 55 (!) 51 69 (!) 58  Resp: 16 16 18 18   Temp: 97.9 F (36.6 C) 98.7 F (37.1 C) 97.7 F (36.5 C) 98.3 F (36.8 C)  TempSrc: Oral Oral Oral Oral  SpO2: 96% 97% 96% 95%  Weight:      Height:       CBC:  Recent Labs  Lab 10/07/20 0953 10/07/20 0959 10/08/20 0443  WBC 6.3  --  8.5  NEUTROABS 3.7  --  7.0  HGB 14.6 15.0 12.6  HCT 46.2* 44.0 39.6  MCV 91.1  --  90.0  PLT 288  --  265   Basic Metabolic Panel:  Recent Labs  Lab 10/07/20 0953 10/07/20 0959 10/08/20 0443  NA 138 139 137  K 4.4 4.3 4.0  CL 107 107 109  CO2 22  --  19*  GLUCOSE 109* 101* 105*  BUN 29* 33* 22  CREATININE 1.03* 1.00 0.84  CALCIUM 9.3  --  8.7*   Lipid Panel:  Recent Labs  Lab 10/08/20 0443  CHOL 124  TRIG 52  HDL 49  CHOLHDL 2.5  VLDL 10  LDLCALC 65   HgbA1c:  Recent Labs  Lab 10/08/20 0443  HGBA1C 6.1*   Urine Drug Screen:  Recent Labs  Lab 10/07/20 1719  LABOPIA NONE DETECTED  COCAINSCRNUR NONE DETECTED  LABBENZ NONE DETECTED  AMPHETMU NONE DETECTED  THCU NONE DETECTED  LABBARB NONE DETECTED     IMAGING past 24 hours  Ct Head wo contrast 10/10/2019   IMPRESSION: Subacute right frontoparietal and right temporal infarcts.  Hyperdense material within the right  sylvian fissure is unchanged.  CT Head Code stroke wo contrast 10/07/2020  IMPRESSION: 1. Hypodensity right frontal parietal lobe suspicious for acute infarct given the symptoms. In addition, there is a probable small hyperdense vessel in the right sylvian fissure involving the right middle cerebral artery. 2. ASPECTS is 9   CT Angio Head Neck w or wo contrast 10/07/2020  IMPRESSION: 1. Right M2/MCA posterior division branch occlusion with moderate distal branch opacification via leptomeningeal collaterals. 2. No hemodynamically significant stenosis in the neck.  Echocardiogram 10/09/2020  IMPRESSIONS    1. Left ventricular ejection fraction, by estimation, is 30 to 35%. The  left ventricle has moderately decreased function. The left ventricle  demonstrates global hypokinesis. The left ventricular internal cavity size  was mildly dilated. There is mild  left ventricular hypertrophy. Left ventricular diastolic parameters are  consistent with Grade I diastolic dysfunction (impaired relaxation).  2. Right ventricular systolic function is mildly reduced. The right  ventricular size is normal. Tricuspid regurgitation signal is inadequate  for assessing PA pressure.  3. Left atrial size was mildly dilated.  4. The mitral valve is normal in structure. No evidence of mitral valve  regurgitation. No evidence of mitral stenosis.  5. The aortic valve is tricuspid. Aortic valve regurgitation is mild. No  aortic stenosis is present.  6. The inferior vena cava is normal in size with greater than 50%  respiratory variability, suggesting right atrial pressure of 3 mmHg.   PHYSICAL EXAM  General: Elderly female lying comfortably in bed, NAD HEENT: [place]/AT, MMM, PERRLA Cardiovascular: Regular rate and rhythm Respiratory: No respiratory distress Gastrointestinal: Soft, no rigidity no, no tenderness Extremities: No peripheral edema Psych: Normal mood and  affect Neurological:  Mental Status: Alert, oriented, thought content appropriate.  Speech fluent without evidence of aphasia except cannot spell the word world backwards.  Able to follow 3 step commands without difficulty. Cranial Nerves: II:  Visual fields grossly normal, pupils equal, round, reactive to light and accommodation III,IV, VI: ptosis not present, extra-ocular motions intact bilaterally V,VII: smile symmetric, facial light touch sensation decreased on left VIII: hearing normal bilaterally IX,X: uvula rises symmetrically XI: bilateral shoulder shrug XII: midline tongue extension without atrophy or fasciculations  Motor: Right : Upper extremity   5/5    Left:     Upper extremity   3/5  Lower extremity   5/5     Lower extremity   4/5 Tone and bulk:normal tone throughout; no atrophy noted Sensory: Pinprick and light touch is decreased on left side. Deep Tendon Reflexes:  Right: Upper Extremity   Left: Upper extremity   biceps (C-5 to C-6) 2/4   biceps (C-5 to C-6) 2/4 tricep (C7) 2/4    triceps (C7) 2/4 Brachioradialis (C6) 2/4  Brachioradialis (C6) 2/4  Lower Extremity Lower Extremity  quadriceps (L-2 to L-4) 2/4   quadriceps (L-2 to L-4) 2/4 Achilles (S1) 2/4   Achilles (S1) 2/4  Plantars: Right: downgoing   Left: downgoing Cerebellar: Mildly abnormal finger-to-nose, and keep her eyes open Gait: Deferred     ASSESSMENT/PLAN Ms. TYKERIA WAWRZYNIAK is a 65 y.o. 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  Head CT revealed a potential acute infarct in right parietal region.  CTA confirmed an acute right M2 occlusion.  No tPA given due to  recent AICD placement.  MRI is contraindicated due to recent pacemaker procedure, per MRI technician need a minimum of 6 weeks from procedure.  Patient is s/p thrombectomy.  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  Code Stroke CT head:  Hypodensity right frontal parietal lobe suspicious for acute infarct. In addition, hyperdense sign at right sylvian fissure MCA  CTA head & neck : Right M2/MCA posterior division branch occlusion with moderate distal branch opacification via leptomeningeal collaterals.   MRI: Contraindicated due to recent pacemaker switch, per MRI technician need a minimum of 6 weeks from procedure. (09/25/2020)  IR - R M2 inferior division TICI 2c revasc  CT head post op 1/25 - unchanged  CT repeat 10/09/2020: Subacute right frontoparietal and right temporal infarct.  SAH within the right sylvian fissure is unchanged  2D Echo -  EF 30 to 35%.   ICD interrogation no afib - new ICD from 09/25/20  LDL 65  HgbA1c: 6.1  VTE prophylaxis -SCDs  aspirin 81 mg  daily prior to admission, now on No antithrombotic now due to [facility] post op. Consider ASA next week after CT repeat.   Therapy recommendations: CIR   Disposition: Pending  nonischemic cardiomyopathy CHF  EF 30 to 35% in 2013  S/p ICD exchange, interrogation no afib   EF improved to 65 to 70% in 2016  Current TTE EF 30 to 35%.   Recent ICD exchange on 09/25/20  Will call cardiology consult - pt would like to follow up with [facility] cardiology now  Hypertension  Home meds: Metoprolol 25 mg  Now on labetalol 5 mg PRN, now off Cleviprex . BP stable . Long-term BP goal normotensive   Hyperlipidemia   No statin at home  LDL 65, goal < 70  Currently LDL at goal, no statin initiated.   Tobacco use disorder  Home meds: Nicotine 21 mg patch daily  Start nicotine 21 mg patch daily  Smoking cessation provided  Pt is  willing to quit  Other Stroke Risk Factors  Advanced Age >/= 61   Obesity, Body mass index is 34.87 kg/m., BMI >/= 30 associated with increased stroke risk, recommend weight loss, diet and exercise as appropriate   Hx stroke/TIA: Postpartum stroke in the past with no residual affects   Hospital day # 4  ATTENDING NOTE: I reviewed above note and agree with the assessment and plan. Pt was seen and examined.  She reports left upper extremity still being the same.  Left upper extremity is graded as 3/5 both proximally and distally left lower extremity 4/5.  No facial asymmetry.  No family at bedside, no acute event overnight.  She still continues to complain of significant headaches.  Medication does help but it wears off afterwards.  We will increase the dose of the Fioricet to 2 tablets.  TTE showed EF 30-35%, decreased from 2016. Pt stated that she would like to follow up with [facility] cardiology from now on, will call cardiology consult to establish care. Repeat CT head yesterday showed subacute right frontoparietal and right temporal infarct.  SAH within the right sylvian fissure is unchanged. Will consider ASA after repeat CT next week.         To contact Stroke Continuity provider, please refer to [email or web address]. After hours, contact General Neurology

## 2020-10-12 ENCOUNTER — Inpatient Hospital Stay (HOSPITAL_COMMUNITY): Payer: Medicare HMO

## 2020-10-12 DIAGNOSIS — R079 Chest pain, unspecified: Secondary | ICD-10-CM

## 2020-10-12 LAB — CBC
HCT: 43.7 % (ref 36.0–46.0)
Hemoglobin: 14.3 g/dL (ref 12.0–15.0)
MCH: 28.8 pg (ref 26.0–34.0)
MCHC: 32.7 g/dL (ref 30.0–36.0)
MCV: 88.1 fL (ref 80.0–100.0)
Platelets: 267 10*3/uL (ref 150–400)
RBC: 4.96 MIL/uL (ref 3.87–5.11)
RDW: 14.6 % (ref 11.5–15.5)
WBC: 8.7 10*3/uL (ref 4.0–10.5)
nRBC: 0 % (ref 0.0–0.2)

## 2020-10-12 LAB — BASIC METABOLIC PANEL
Anion gap: 11 (ref 5–15)
BUN: 24 mg/dL — ABNORMAL HIGH (ref 8–23)
CO2: 22 mmol/L (ref 22–32)
Calcium: 9.5 mg/dL (ref 8.9–10.3)
Chloride: 103 mmol/L (ref 98–111)
Creatinine, Ser: 0.96 mg/dL (ref 0.44–1.00)
GFR, Estimated: >60 mL/min (ref >60)
Glucose, Bld: 107 mg/dL — ABNORMAL HIGH (ref 70–99)
Potassium: 4 mmol/L (ref 3.5–5.1)
Sodium: 136 mmol/L (ref 135–145)

## 2020-10-12 LAB — TROPONIN I (HIGH SENSITIVITY)
Troponin I (High Sensitivity): 17 ng/L (ref <18)
Troponin I (High Sensitivity): 20 ng/L — ABNORMAL HIGH (ref <18)
Troponin I (High Sensitivity): 22 ng/L — ABNORMAL HIGH (ref <18)

## 2020-10-12 LAB — SEDIMENTATION RATE: Sed Rate: 15 mm/hr (ref 0–22)

## 2020-10-12 LAB — C-REACTIVE PROTEIN: CRP: 4 mg/dL — ABNORMAL HIGH (ref <1.0)

## 2020-10-12 MED ORDER — SODIUM CHLORIDE 0.9 % IV SOLN
250.0000 mg | Freq: Two times a day (BID) | INTRAVENOUS | Status: DC
  Administered 2020-10-12 – 2020-10-13 (×2): 250 mg via INTRAVENOUS
  Filled 2020-10-12 (×5): qty 2

## 2020-10-12 MED ORDER — KETOROLAC TROMETHAMINE 15 MG/ML IJ SOLN
15.0000 mg | Freq: Once | INTRAMUSCULAR | Status: AC
  Administered 2020-10-12: 15 mg via INTRAVENOUS
  Filled 2020-10-12: qty 1

## 2020-10-12 NOTE — Progress Notes (Signed)
STROKE TEAM PROGRESS NOTE   INTERVAL HISTORY Patient continues to have unrelenting headache involving the right.  The fioricet was increased with not much benefit. She also reports new complain of epigastric pain. This started last night.  Vitals:   10/11/20 2356 10/12/20 0459 10/12/20 0805 10/12/20 1231  BP: (!) 132/93 (!) 139/94 128/72 120/76  Pulse: (!) 58 74 64 (!) 58  Resp: 18 18 20 20   Temp: 98.3 F (36.8 C) 98.2 F (36.8 C) 98.4 F (36.9 C) 98.8 F (37.1 C)  TempSrc: Oral Oral Oral Oral  SpO2: 96% 94% 98% 97%  Weight:      Height:       CBC:  Recent Labs  Lab 10/07/20 0953 10/07/20 0959 10/08/20 0443 10/12/20 0022  WBC 6.3  --  8.5 8.7  NEUTROABS 3.7  --  7.0  --   HGB 14.6   < > 12.6 14.3  HCT 46.2*   < > 39.6 43.7  MCV 91.1  --  90.0 88.1  PLT 288  --  265 267   < > = values in this interval not displayed.   Basic Metabolic Panel:  Recent Labs  Lab 10/08/20 0443 10/12/20 0022  NA 137 136  K 4.0 4.0  CL 109 103  CO2 19* 22  GLUCOSE 105* 107*  BUN 22 24*  CREATININE 0.84 0.96  CALCIUM 8.7* 9.5   Lipid Panel:  Recent Labs  Lab 10/08/20 0443  CHOL 124  TRIG 52  HDL 49  CHOLHDL 2.5  VLDL 10  LDLCALC 65   HgbA1c:  Recent Labs  Lab 10/08/20 0443  HGBA1C 6.1*   Urine Drug Screen:  Recent Labs  Lab 10/07/20 1719  LABOPIA NONE DETECTED  COCAINSCRNUR NONE DETECTED  LABBENZ NONE DETECTED  AMPHETMU NONE DETECTED  THCU NONE DETECTED  LABBARB NONE DETECTED     IMAGING   Ct Head wo contrast 10/10/2019 IMPRESSION: Subacute right frontoparietal and right temporal infarcts. Hyperdense material within the right sylvian fissure is unchanged.  CT Head Code stroke wo contrast 10/07/2020 IMPRESSION: 1. Hypodensity right frontal parietal lobe suspicious for acute infarct given the symptoms. In addition, there is a probable small hyperdense vessel in the right sylvian fissure involving the right middle cerebral artery. 2. ASPECTS is 9  CT  Angio Head Neck w or wo contrast 10/07/2020 IMPRESSION: 1. Right M2/MCA posterior division branch occlusion with moderate distal branch opacification via leptomeningeal collaterals. 2. No hemodynamically significant stenosis in the neck.  Echocardiogram 10/09/2020 IMPRESSIONS  1. Left ventricular ejection fraction, by estimation, is 30 to 35%. The  left ventricle has moderately decreased function. The left ventricle  demonstrates global hypokinesis. The left ventricular internal cavity size  was mildly dilated. There is mild  left ventricular hypertrophy. Left ventricular diastolic parameters are  consistent with Grade I diastolic dysfunction (impaired relaxation).  2. Right ventricular systolic function is mildly reduced. The right  ventricular size is normal. Tricuspid regurgitation signal is inadequate  for assessing PA pressure.  3. Left atrial size was mildly dilated.  4. The mitral valve is normal in structure. No evidence of mitral valve  regurgitation. No evidence of mitral stenosis.  5. The aortic valve is tricuspid. Aortic valve regurgitation is mild. No  aortic stenosis is present.  6. The inferior vena cava is normal in size with greater than 50%  respiratory variability, suggesting right atrial pressure of 3 mmHg.   PHYSICAL EXAM  General: Elderly female lying comfortably in bed, NAD HEENT:  [place]/AT, MMM, PERRLA;  She appears have some scalp tenderness on the right which is concerning. Cardiovascular: Regular rate and rhythm Respiratory: No respiratory distress Gastrointestinal: Soft, no rigidity no, no tenderness Extremities: No peripheral edema Psych: Normal mood and affect Neurological:  Mental Status: Alert, oriented, thought content appropriate.  Speech fluent without evidence of aphasia except cannot spell the word world backwards.  Able to follow 3 step commands without difficulty. Cranial Nerves: II:  Visual fields grossly normal, pupils equal, round,  reactive to light and accommodation III,IV, VI: ptosis not present, extra-ocular motions intact bilaterally V,VII: smile symmetric, facial light touch sensation decreased on left VIII: hearing normal bilaterally IX,X: uvula rises symmetrically XI: bilateral shoulder shrug XII: midline tongue extension without atrophy or fasciculations  Motor: Right : Upper extremity   5/5    Left:     Upper extremity   3/5  Lower extremity   5/5     Lower extremity   4/5 Tone and bulk:normal tone throughout; no atrophy noted Sensory: Pinprick and light touch is decreased on left side. Deep Tendon Reflexes:  Right: Upper Extremity   Left: Upper extremity   biceps (C-5 to C-6) 2/4   biceps (C-5 to C-6) 2/4 tricep (C7) 2/4    triceps (C7) 2/4 Brachioradialis (C6) 2/4  Brachioradialis (C6) 2/4  Lower Extremity Lower Extremity  quadriceps (L-2 to L-4) 2/4   quadriceps (L-2 to L-4) 2/4 Achilles (S1) 2/4   Achilles (S1) 2/4  Plantars: Right: downgoing   Left: downgoing Cerebellar: Mildly abnormal finger-to-nose, and keep her eyes open Gait: Deferred     ASSESSMENT/PLAN [name] is a 65 y.o. 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  Head CT revealed a potential acute infarct in right parietal region.  CTA confirmed an acute right M2 occlusion.  No tPA given due to recent AICD placement.  MRI is contraindicated due to recent pacemaker procedure, per MRI technician need a minimum of 6 weeks from procedure.  Patient is s/p thrombectomy.  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  Code Stroke CT head:  Hypodensity right frontal parietal lobe suspicious for acute infarct. In addition, hyperdense sign at right sylvian fissure MCA  CTA head & neck : Right M2/MCA posterior division branch occlusion with moderate distal branch opacification via leptomeningeal collaterals.   MRI: Contraindicated due to recent pacemaker switch, per MRI technician need a minimum of 6 weeks from procedure. (09/25/2020)  IR - R M2 inferior division TICI 2c revasc  CT head post op 1/25 - unchanged  CT repeat 10/09/2020: Subacute right frontoparietal and right temporal infarct.  SAH within the right sylvian fissure is unchanged  2D Echo -  EF 30 to 35%.   ICD interrogation no afib - new ICD from 09/25/20  LDL 65  HgbA1c: 6.1  VTE prophylaxis -SCDs  aspirin 81 mg daily prior to admission, now on No antithrombotic now due to [facility] post op. Consider ASA next week after CT repeat.   Therapy recommendations: CIR   Disposition: Pending  Nonischemic cardiomyopathy CHF  EF 30 to 35% in 2013  S/p ICD exchange, interrogation no afib  EF improved to 65 to 70% in 2016  Current TTE EF 30 to 35%.                          Recent ICD exchange on 09/25/20 at [facility] dual chamber pacemaker   Cardiology consult called ? - pt would like to follow up with [facility] cardiology now - consult pending ?  Pt was seen here 10/07/20 by Cardiologist Jodelle Red, MD, PhD, [facility]  Hypertension  Home meds: Metoprolol 25 mg  Now on labetalol 5 mg PRN, now off Cleviprex . BP stable . Long-term BP goal normotensive   Hyperlipidemia   No statin at home  LDL 65, goal < 70  Currently LDL at goal, no statin initiated.   Tobacco use disorder  Home meds: Nicotine 21 mg patch daily  Start nicotine 21 mg patch daily  Smoking cessation provided  Pt is willing to quit  Other Stroke Risk Factors  Advanced Age >/= 49   Obesity, Body mass index is 34.87 kg/m., BMI  >/= 30 associated with increased stroke risk, recommend weight loss, diet and exercise as appropriate   Hx stroke/TIA: Postpartum stroke in the past with no residual affects   New finding of a epigastric pain/ chest discomfort. EKG will be obtained also troponin 1. Consultation with hospitalist will also be obtained. The case is discussed with the hospitalist on-call.   Unrelenting headaches were some for possible vasculitis. Sed rate and  See. Will be obtained. Also consider impaired doses of steroid.   Hospital day # 5  I reviewed above note and agree with the assessment and plan. Pt was seen and examined.  She reports left upper extremity still being the same.  Left upper extremity is graded as 3/5 both proximally and distally left lower extremity 4/5.  No facial asymmetry.   TTE showed EF 30-35%, decreased from 2016. Pt stated that she would like to follow up with [facility] cardiology from now on, will call cardiology consult to establish care. Repeat CT head yesterday showed subacute right frontoparietal and right temporal infarct.  SAH within the right sylvian fissure is unchanged. Will consider ASA after repeat CT next week.       [id number] To contact Stroke Continuity provider, please refer to [email or web address]. After hours, contact General Neurology

## 2020-10-12 NOTE — Consult Note (Addendum)
Medical Consultation   Rebecca Singh  ZJQ:[id number]  DOB: 19-Mar-1956  DOA: 10/07/2020  PCP: [facility]   Outpatient Specialists: Huel Coventry, MD (Electophysiology); Cassell Smiles, MD (orthopedic surgery)    Requesting physician: Beryle Beams, MD (Neurology)   Reason for consultation: Chest pain     History of Present Illness: Rebecca Singh is an 65 y.o. female nonischemic cardiomyopathy with EF 30 to 35% and CRT-defibrillator, COPD, chronic pain, peripartum CVA, prediabetes, tobacco abuse, and prior history of polysubstance abuse, who presented to the emergency department as code stroke on 10/07/2020 with severe headache, left-sided weakness, and dysarthria, was found to have acute ischemic right MCA stroke, intra-arterial thrombectomy, was admitted to the neurology service, accepted to CIR on 10/10/2020, has been complaining of ongoing headaches, and developed pain in the lower chest last night.  Patient reports that she has been experiencing severe headaches since the admission, has some partial and short lasting relief with acetaminophen and Fioricet, and then developed discomfort in the lower chest last night.  Chest discomfort has been constant, localized to the lower chest in the center, worse with certain arm movements and when she begins to sit up, and also worse with palpation.  She had never experienced this previously.  There is no associated shortness of breath, cough, nausea, or diaphoresis.  She had a pharmacologic myocardial perfusion imaging study in March 2019 with no significant ischemia.  She had transthoracic echocardiogram on 10/09/2020 with EF 30 to 35%, global hypokinesis, mildly dilated LV, mild LVH, grade 1 diastolic dysfunction, and mild left atrial enlargement.  She had EKG performed this evening with AV dual paced rhythm and T wave inversions.  High-sensitivity troponin was 17 and then 20 this evening.  Hospitalist service was  consulted by neurology for evaluation.    Review of Systems:   As per HPI otherwise 10 point review of systems negative.    Review of Systems Past Medical History: Past Medical History:  Diagnosis Date  . AICD (automatic cardioverter/defibrillator) present   . COPD (chronic obstructive pulmonary disease) (HCC)   . Hypothyroidism   . Myocardial infarction (HCC)   . Pacemaker/defibrillator   . Stroke [facility])     Past Surgical History: Past Surgical History:  Procedure Laterality Date  . ABDOMINAL HYSTERECTOMY    . CHOLECYSTECTOMY    . INSERT / REPLACE / REMOVE PACEMAKER    . IR PERCUTANEOUS ART THROMBECTOMY/INFUSION INTRACRANIAL INC DIAG ANGIO  10/07/2020  . RADIOLOGY WITH ANESTHESIA N/A 10/07/2020   Procedure: IR WITH ANESTHESIA - CODE STROKE;  Surgeon: Radiologist, Medication, MD;  Location: [facility] OR;  Service: Radiology;  Laterality: N/A;  . THYROIDECTOMY    . TONSILLECTOMY       Allergies:   Allergies  Allergen Reactions  . Codeine Shortness Of Breath  . Aspirin Other (See Comments)    "burns" the patient's stomach  . Latex Other (See Comments)    Makes the "skin peel"   . Other Nausea Only and Other (See Comments)    Spicy foods- "Upset the patient's stomach"     Social History:  reports that she has been smoking cigarettes. She has a 15.00 pack-year smoking history. She has never used smokeless tobacco. She reports that she does not drink alcohol and does not use drugs.   Family History: History reviewed. No pertinent family history.   Physical Exam: Vitals:   10/12/20 0805 10/12/20 1231 10/12/20  1614 10/12/20 2006  BP: 128/72 120/76 117/79 120/81  Pulse: 64 (!) 58 76 (!) 55  Resp: 20 20 20 16   Temp: 98.4 F (36.9 C) 98.8 F (37.1 C) 98.2 F (36.8 C) 98 F (36.7 C)  TempSrc: Oral Oral Oral Oral  SpO2: 98% 97% 98% 96%  Weight:      Height:        Constitutional: Alert and awake, oriented x3, not in any acute distress. Eyes: PERLA, EOMI, irises  appear normal, anicteric sclera,  ENMT: external ears and nose appear normal, lips appears normal, oropharynx mucosa, tongue, posterior pharynx appear normal  Neck: neck appears normal, no masses, normal ROM, no thyromegaly, no JVD  CVS: S1-S2 clear, no murmur rubs or gallops, no LE edema, normal pedal pulses  Respiratory:  clear to auscultation bilaterally, no wheezing, rales or rhonchi. Respiratory effort normal. No accessory muscle use.  Abdomen: soft nontender, nondistended, normal bowel sounds, no hepatosplenomegaly, no hernias  Musculoskeletal: tender lower sternum, clubbing or edema noted bilaterally  Neuro: Cranial nerves II-XII grossly intact, moving all extremities  Psych: judgement and insight appear normal, stable mood and affect, mental status Skin: no rashes or lesions or ulcers, no induration or nodules    Data reviewed:  I have personally reviewed following labs and imaging studies Labs:  CBC: Recent Labs  Lab 10/07/20 0953 10/07/20 0959 10/08/20 0443 10/12/20 0022  WBC 6.3  --  8.5 8.7  NEUTROABS 3.7  --  7.0  --   HGB 14.6 15.0 12.6 14.3  HCT 46.2* 44.0 39.6 43.7  MCV 91.1  --  90.0 88.1  PLT 288  --  265 267    Basic Metabolic Panel: Recent Labs  Lab 10/07/20 0953 10/07/20 0959 10/08/20 0443 10/12/20 0022  NA 138 139 137 136  K 4.4 4.3 4.0 4.0  CL 107 107 109 103  CO2 22  --  19* 22  GLUCOSE 109* 101* 105* 107*  BUN 29* 33* 22 24*  CREATININE 1.03* 1.00 0.84 0.96  CALCIUM 9.3  --  8.7* 9.5   GFR Estimated Creatinine Clearance: 69 mL/min (by C-G formula based on SCr of 0.96 mg/dL). Liver Function Tests: Recent Labs  Lab 10/07/20 0953  AST 21  ALT 17  ALKPHOS 62  BILITOT 0.7  PROT 6.9  ALBUMIN 3.8   No results for input(s): LIPASE, AMYLASE in the last 168 hours. No results for input(s): AMMONIA in the last 168 hours. Coagulation profile Recent Labs  Lab 10/07/20 0953  INR 1.0    Cardiac Enzymes: No results for input(s): CKTOTAL,  CKMB, CKMBINDEX, TROPONINI in the last 168 hours. BNP: Invalid input(s): POCBNP CBG: Recent Labs  Lab 10/07/20 0954  GLUCAP 101*   D-Dimer No results for input(s): DDIMER in the last 72 hours. Hgb A1c No results for input(s): HGBA1C in the last 72 hours. Lipid Profile No results for input(s): CHOL, HDL, LDLCALC, TRIG, CHOLHDL, LDLDIRECT in the last 72 hours. Thyroid function studies No results for input(s): TSH, T4TOTAL, T3FREE, THYROIDAB in the last 72 hours.  Invalid input(s): FREET3 Anemia work up No results for input(s): VITAMINB12, FOLATE, FERRITIN, TIBC, IRON, RETICCTPCT in the last 72 hours. Urinalysis    Component Value Date/Time   COLORURINE YELLOW 10/07/2020 1719   APPEARANCEUR CLEAR 10/07/2020 1719   LABSPEC >1.046 (H) 10/07/2020 1719   PHURINE 5.0 10/07/2020 1719   GLUCOSEU NEGATIVE 10/07/2020 1719   HGBUR SMALL (A) 10/07/2020 1719   BILIRUBINUR NEGATIVE 10/07/2020 1719   KETONESUR  NEGATIVE 10/07/2020 1719   PROTEINUR NEGATIVE 10/07/2020 1719   NITRITE NEGATIVE 10/07/2020 1719   LEUKOCYTESUR NEGATIVE 10/07/2020 1719     Sepsis Labs Invalid input(s): PROCALCITONIN,  WBC,  LACTICIDVEN Microbiology Recent Results (from the past 240 hour(s))  SARS Coronavirus 2 by RT PCR (hospital order, performed in [facility] hospital lab) Nasopharyngeal Nasopharyngeal Swab     Status: None   Collection Time: 10/07/20 10:39 AM   Specimen: Nasopharyngeal Swab  Result Value Ref Range Status   SARS Coronavirus 2 NEGATIVE NEGATIVE Final    Comment: (NOTE) SARS-CoV-2 target nucleic acids are NOT DETECTED.  The SARS-CoV-2 RNA is generally detectable in upper and lower respiratory specimens during the acute phase of infection. The lowest concentration of SARS-CoV-2 viral copies this assay can detect is 250 copies / mL. A negative result does not preclude SARS-CoV-2 infection and should not be used as the sole basis for treatment or other patient management decisions.  A  negative result may occur with improper specimen collection / handling, submission of specimen other than nasopharyngeal swab, presence of viral mutation(s) within the areas targeted by this assay, and inadequate number of viral copies (<250 copies / mL). A negative result must be combined with clinical observations, patient history, and epidemiological information.  Fact Sheet for Patients:   BoilerBrush.com.cy  Fact Sheet for Healthcare Providers: https://pope.com/  This test is not yet approved or  cleared by the [place] FDA and has been authorized for detection and/or diagnosis of SARS-CoV-2 by FDA under an Emergency Use Authorization (EUA).  This EUA will remain in effect (meaning this test can be used) for the duration of the COVID-19 declaration under Section 564(b)(1) of the Act, 21 U.S.C. section 360bbb-3(b)(1), unless the authorization is terminated or revoked sooner.  Performed at [facility], [address], [place], [place] [address]   MRSA PCR Screening     Status: None   Collection Time: 10/07/20  5:20 PM   Specimen: Nasal Mucosa; Nasopharyngeal  Result Value Ref Range Status   MRSA by PCR NEGATIVE NEGATIVE Final    Comment:        The GeneXpert MRSA Assay (FDA approved for NASAL specimens only), is one component of a comprehensive MRSA colonization surveillance program. It is not intended to diagnose MRSA infection nor to guide or monitor treatment for MRSA infections. Performed at [facility], [address] [place], [place] [address]        Inpatient Medications:   Scheduled Meds: .  stroke: mapping our early stages of recovery book   Does not apply Once  . Chlorhexidine Gluconate Cloth  6 each Topical Daily  . ketorolac  15 mg Intravenous Once  . nicotine  21 mg Transdermal Daily  . pantoprazole  40 mg Oral Daily  . sodium chloride flush  3 mL Intravenous Once   Continuous  Infusions: . methylPREDNISolone (SOLU-MEDROL) injection       Radiological Exams on Admission: No results found.  Impression/Recommendations  1. Chest pain  - Patient seen for mid-lower sternal pain that began night of 1/29, has been constant but worse with some arm movements and with palpation  - Seems to be MSK but had slightly abnormal 2nd troponin and EKG with new T-wave inversions  - Reviewed case and EKGs with cardiology fellow, appreciate his assistance, plan to check CXR, treat musculoskeletal pain, and check one more troponin; if troponin is significantly elevated, will rediscuss with cardiology for next steps  2. Nonischemic cardiomyopathy  - TTE (10/09/20) with EF 30-35%, global hypokinesis, mildly dilated LV, mild LVH, mild left atrial enlargement, grade 1 diastolic dysfunction  - Appears compensated, planning to [facility] with [facility] cardiology on discharge   3. Acute ischemic right MCA stroke; right MCA embolism  - Admitted 10/07/20 with acute right MCA infarct, underwent thrombectomy 1/25, now waiting for CIR bed     Thank you for this consultation.     Time Spent: 68 minutes.   Lavone Neri Colbert Curenton M.D. [facility] 10/12/2020, 8:28 PM

## 2020-10-13 ENCOUNTER — Inpatient Hospital Stay (HOSPITAL_COMMUNITY): Payer: Medicare HMO

## 2020-10-13 ENCOUNTER — Other Ambulatory Visit: Payer: Self-pay

## 2020-10-13 ENCOUNTER — Encounter (HOSPITAL_COMMUNITY): Payer: Self-pay | Admitting: Physical Medicine & Rehabilitation

## 2020-10-13 ENCOUNTER — Inpatient Hospital Stay (HOSPITAL_COMMUNITY)
Admission: RE | Admit: 2020-10-13 | Discharge: 2020-10-24 | DRG: 057 | Disposition: A | Discharge status: Home / Self Care | Payer: Medicare HMO | Source: Intra-hospital | Attending: Physical Medicine & Rehabilitation | Admitting: Physical Medicine & Rehabilitation

## 2020-10-13 DIAGNOSIS — E669 Obesity, unspecified: Secondary | ICD-10-CM | POA: Diagnosis present

## 2020-10-13 DIAGNOSIS — I5022 Chronic systolic (congestive) heart failure: Secondary | ICD-10-CM

## 2020-10-13 DIAGNOSIS — R071 Chest pain on breathing: Secondary | ICD-10-CM

## 2020-10-13 DIAGNOSIS — G441 Vascular headache, not elsewhere classified: Secondary | ICD-10-CM | POA: Diagnosis not present

## 2020-10-13 DIAGNOSIS — I69354 Hemiplegia and hemiparesis following cerebral infarction affecting left non-dominant side: Secondary | ICD-10-CM | POA: Diagnosis present

## 2020-10-13 DIAGNOSIS — I10 Essential (primary) hypertension: Secondary | ICD-10-CM

## 2020-10-13 DIAGNOSIS — I63511 Cerebral infarction due to unspecified occlusion or stenosis of right middle cerebral artery: Secondary | ICD-10-CM | POA: Diagnosis not present

## 2020-10-13 DIAGNOSIS — I69398 Other sequelae of cerebral infarction: Secondary | ICD-10-CM

## 2020-10-13 DIAGNOSIS — Z886 Allergy status to analgesic agent status: Secondary | ICD-10-CM | POA: Diagnosis not present

## 2020-10-13 DIAGNOSIS — Z96652 Presence of left artificial knee joint: Secondary | ICD-10-CM | POA: Diagnosis present

## 2020-10-13 DIAGNOSIS — K219 Gastro-esophageal reflux disease without esophagitis: Secondary | ICD-10-CM | POA: Diagnosis present

## 2020-10-13 DIAGNOSIS — J449 Chronic obstructive pulmonary disease, unspecified: Secondary | ICD-10-CM | POA: Diagnosis present

## 2020-10-13 DIAGNOSIS — I252 Old myocardial infarction: Secondary | ICD-10-CM | POA: Diagnosis not present

## 2020-10-13 DIAGNOSIS — N179 Acute kidney failure, unspecified: Secondary | ICD-10-CM | POA: Diagnosis not present

## 2020-10-13 DIAGNOSIS — I509 Heart failure, unspecified: Secondary | ICD-10-CM | POA: Diagnosis present

## 2020-10-13 DIAGNOSIS — Z950 Presence of cardiac pacemaker: Secondary | ICD-10-CM

## 2020-10-13 DIAGNOSIS — R195 Other fecal abnormalities: Secondary | ICD-10-CM

## 2020-10-13 DIAGNOSIS — N3941 Urge incontinence: Secondary | ICD-10-CM | POA: Diagnosis present

## 2020-10-13 DIAGNOSIS — I309 Acute pericarditis, unspecified: Secondary | ICD-10-CM

## 2020-10-13 DIAGNOSIS — R079 Chest pain, unspecified: Secondary | ICD-10-CM

## 2020-10-13 DIAGNOSIS — I110 Hypertensive heart disease with heart failure: Secondary | ICD-10-CM | POA: Diagnosis present

## 2020-10-13 DIAGNOSIS — I428 Other cardiomyopathies: Secondary | ICD-10-CM | POA: Diagnosis present

## 2020-10-13 DIAGNOSIS — F17210 Nicotine dependence, cigarettes, uncomplicated: Secondary | ICD-10-CM | POA: Diagnosis present

## 2020-10-13 DIAGNOSIS — Z7982 Long term (current) use of aspirin: Secondary | ICD-10-CM | POA: Diagnosis not present

## 2020-10-13 DIAGNOSIS — E039 Hypothyroidism, unspecified: Secondary | ICD-10-CM | POA: Diagnosis present

## 2020-10-13 DIAGNOSIS — Z95810 Presence of automatic (implantable) cardiac defibrillator: Secondary | ICD-10-CM | POA: Diagnosis not present

## 2020-10-13 DIAGNOSIS — F411 Generalized anxiety disorder: Secondary | ICD-10-CM | POA: Diagnosis present

## 2020-10-13 DIAGNOSIS — H539 Unspecified visual disturbance: Secondary | ICD-10-CM | POA: Diagnosis present

## 2020-10-13 DIAGNOSIS — Z6831 Body mass index (BMI) 31.0-31.9, adult: Secondary | ICD-10-CM

## 2020-10-13 DIAGNOSIS — I69390 Apraxia following cerebral infarction: Secondary | ICD-10-CM | POA: Diagnosis not present

## 2020-10-13 DIAGNOSIS — Z91040 Latex allergy status: Secondary | ICD-10-CM

## 2020-10-13 DIAGNOSIS — Z79899 Other long term (current) drug therapy: Secondary | ICD-10-CM | POA: Diagnosis not present

## 2020-10-13 DIAGNOSIS — Z885 Allergy status to narcotic agent status: Secondary | ICD-10-CM

## 2020-10-13 DIAGNOSIS — D72829 Elevated white blood cell count, unspecified: Secondary | ICD-10-CM | POA: Diagnosis present

## 2020-10-13 DIAGNOSIS — R482 Apraxia: Secondary | ICD-10-CM | POA: Diagnosis present

## 2020-10-13 DIAGNOSIS — R7303 Prediabetes: Secondary | ICD-10-CM | POA: Diagnosis present

## 2020-10-13 DIAGNOSIS — H53462 Homonymous bilateral field defects, left side: Secondary | ICD-10-CM | POA: Diagnosis present

## 2020-10-13 LAB — CBC
HCT: 44.7 % (ref 36.0–46.0)
Hemoglobin: 14.7 g/dL (ref 12.0–15.0)
MCH: 29.1 pg (ref 26.0–34.0)
MCHC: 32.9 g/dL (ref 30.0–36.0)
MCV: 88.3 fL (ref 80.0–100.0)
Platelets: 274 10*3/uL (ref 150–400)
RBC: 5.06 MIL/uL (ref 3.87–5.11)
RDW: 14.7 % (ref 11.5–15.5)
WBC: 8.2 10*3/uL (ref 4.0–10.5)
nRBC: 0 % (ref 0.0–0.2)

## 2020-10-13 LAB — BASIC METABOLIC PANEL
Anion gap: 11 (ref 5–15)
BUN: 20 mg/dL (ref 8–23)
CO2: 21 mmol/L — ABNORMAL LOW (ref 22–32)
Calcium: 9.5 mg/dL (ref 8.9–10.3)
Chloride: 106 mmol/L (ref 98–111)
Creatinine, Ser: 0.96 mg/dL (ref 0.44–1.00)
GFR, Estimated: >60 mL/min (ref >60)
Glucose, Bld: 150 mg/dL — ABNORMAL HIGH (ref 70–99)
Potassium: 4.2 mmol/L (ref 3.5–5.1)
Sodium: 138 mmol/L (ref 135–145)

## 2020-10-13 MED ORDER — ACETAMINOPHEN 160 MG/5ML PO SOLN: 650.0000 mg | ORAL | Status: DC | PRN

## 2020-10-13 MED ORDER — ACETAMINOPHEN 650 MG RE SUPP: 650.0000 mg | RECTAL | Status: DC | PRN

## 2020-10-13 MED ORDER — TOPIRAMATE 25 MG PO TABS
25.0000 mg | ORAL_TABLET | Freq: Two times a day (BID) | ORAL | Status: DC
  Administered 2020-10-13: 25 mg via ORAL
  Filled 2020-10-13: qty 1

## 2020-10-13 MED ORDER — ASPIRIN EC 81 MG PO TBEC
81.0000 mg | DELAYED_RELEASE_TABLET | Freq: Every day | ORAL | Status: DC
  Administered 2020-10-13: 81 mg via ORAL
  Filled 2020-10-13: qty 1

## 2020-10-13 MED ORDER — COLCHICINE 0.6 MG PO TABS
0.6000 mg | ORAL_TABLET | Freq: Two times a day (BID) | ORAL | Status: DC
  Administered 2020-10-13 – 2020-10-22 (×18): 0.6 mg via ORAL
  Filled 2020-10-13 (×18): qty 1

## 2020-10-13 MED ORDER — TOPIRAMATE 25 MG PO TABS
25.0000 mg | ORAL_TABLET | Freq: Two times a day (BID) | ORAL | Status: DC
  Administered 2020-10-13 – 2020-10-15 (×4): 25 mg via ORAL
  Filled 2020-10-13 (×4): qty 1

## 2020-10-13 MED ORDER — SENNOSIDES-DOCUSATE SODIUM 8.6-50 MG PO TABS: 1.0000 | ORAL_TABLET | Freq: Every evening | ORAL | Status: DC | PRN

## 2020-10-13 MED ORDER — ONDANSETRON 4 MG PO TBDP
4.0000 mg | ORAL_TABLET | Freq: Three times a day (TID) | ORAL | Status: DC | PRN
  Administered 2020-10-13 – 2020-10-17 (×2): 4 mg via ORAL
  Filled 2020-10-13 (×2): qty 1

## 2020-10-13 MED ORDER — BUTALBITAL-APAP-CAFFEINE 50-325-40 MG PO TABS
2.0000 | ORAL_TABLET | Freq: Three times a day (TID) | ORAL | Status: DC | PRN
  Administered 2020-10-13 – 2020-10-21 (×19): 2 via ORAL
  Filled 2020-10-13 (×20): qty 2

## 2020-10-13 MED ORDER — COLCHICINE 0.6 MG PO TABS
0.6000 mg | ORAL_TABLET | Freq: Two times a day (BID) | ORAL | Status: DC
  Administered 2020-10-13: 0.6 mg via ORAL
  Filled 2020-10-13: qty 1

## 2020-10-13 MED ORDER — PANTOPRAZOLE SODIUM 40 MG PO TBEC
40.0000 mg | DELAYED_RELEASE_TABLET | Freq: Every day | ORAL | Status: DC
  Administered 2020-10-14 – 2020-10-24 (×11): 40 mg via ORAL
  Filled 2020-10-13 (×11): qty 1

## 2020-10-13 MED ORDER — NICOTINE 21 MG/24HR TD PT24
21.0000 mg | MEDICATED_PATCH | Freq: Every day | TRANSDERMAL | Status: DC
  Administered 2020-10-14 – 2020-10-21 (×8): 21 mg via TRANSDERMAL
  Filled 2020-10-13 (×8): qty 1

## 2020-10-13 MED ORDER — ASPIRIN EC 81 MG PO TBEC
81.0000 mg | DELAYED_RELEASE_TABLET | Freq: Every day | ORAL | Status: DC
  Administered 2020-10-14 – 2020-10-24 (×11): 81 mg via ORAL
  Filled 2020-10-13 (×11): qty 1

## 2020-10-13 MED ORDER — ACETAMINOPHEN 325 MG PO TABS
650.0000 mg | ORAL_TABLET | ORAL | Status: DC | PRN
  Administered 2020-10-14 – 2020-10-24 (×13): 650 mg via ORAL
  Filled 2020-10-13 (×15): qty 2

## 2020-10-13 MED ORDER — SODIUM CHLORIDE 0.9 % IV SOLN
250.0000 mg | Freq: Two times a day (BID) | INTRAVENOUS | Status: AC
  Administered 2020-10-13 – 2020-10-14 (×2): 250 mg via INTRAVENOUS
  Filled 2020-10-13 (×2): qty 2

## 2020-10-13 NOTE — Plan of Care (Signed)

## 2020-10-13 NOTE — Consult Note (Signed)
Cardiology Consultation:   Patient ID: Rebecca Singh MRN: [id number]; DOB: July 04, 1956  Admit date: 10/07/2020 Date of Consult: 10/13/2020  Primary Care Provider: [facility], [facility] Cardiologist: No primary care provider on file.  [facility] Electrophysiologist:  None   Patient Profile:   [name] is a 36F with NICM (EF 30-35%) s/p CRTD, COPD, chronic pain, peripartum CVA, prediabetes, tobacco abuse and prior polysubstance use who initially presented to the ED on 10/07/20 with severe HA, L sided weakness and dysarthria for which code stroke was activated. She was found to have an acute R MCA CVA requiring emergency thrombectomy.  Medicine was consulted for chest pain.   History of Present Illness:   [name] reported chest discomfort that started on 1/29 evening.  She felt chest discomfort was constant, worse with certain movements and with palpation.  She never experienced any similar episodes and denied any shortness of breath, chest pressure, or diaphoresis.  Chest pain is sharp and stabbing in character and was 4-6/10 severity when I examined her.  She does not have any history of obstructive CAD but does have several risk factors including prediabetes and tobacco use.  Past Medical History:  Diagnosis Date  . AICD (automatic cardioverter/defibrillator) present   . COPD (chronic obstructive pulmonary disease) (HCC)   . Hypothyroidism   . Myocardial infarction (HCC)   . Pacemaker/defibrillator   . Stroke [facility])    Past Surgical History:  Procedure Laterality Date  . ABDOMINAL HYSTERECTOMY    . CHOLECYSTECTOMY    . INSERT / REPLACE / REMOVE PACEMAKER    . IR PERCUTANEOUS ART THROMBECTOMY/INFUSION INTRACRANIAL INC DIAG ANGIO  10/07/2020  . RADIOLOGY WITH ANESTHESIA N/A 10/07/2020   Procedure: IR WITH ANESTHESIA - CODE STROKE;  Surgeon: Radiologist, Medication, MD;  Location: [facility] OR;  Service: Radiology;  Laterality: N/A;  . THYROIDECTOMY    .  TONSILLECTOMY      Home Medications:  Prior to Admission medications   Medication Sig Start Date End Date Taking? Authorizing Provider  ibuprofen (ADVIL) 200 MG tablet Take 200-400 mg by mouth every 6 (six) hours as needed (for knee and back pain).   Yes [provider]  NEXIUM 24HR 20 MG capsule Take 20 mg by mouth daily before breakfast.   Yes [provider]  aspirin EC 81 MG EC tablet Take 1 tablet (81 mg total) by mouth daily. Patient not taking: No sig reported 11/17/17   Adrian Saran, MD  cyclobenzaprine (FLEXERIL) 10 MG tablet Take 1 tablet (10 mg total) by mouth 3 (three) times daily as needed. Patient not taking: No sig reported 12/10/18   Faythe Ghee, PA-C  metoprolol tartrate (LOPRESSOR) 25 MG tablet Take 1 tablet (25 mg total) by mouth 2 (two) times daily. Patient not taking: No sig reported 11/17/17   Adrian Saran, MD  nicotine (NICODERM CQ - DOSED IN MG/24 HOURS) 21 mg/24hr patch Place 1 patch (21 mg total) onto the skin daily. Patient not taking: No sig reported 11/17/17   Adrian Saran, MD  ondansetron (ZOFRAN-ODT) 4 MG disintegrating tablet Take 1 tablet (4 mg total) by mouth every 8 (eight) hours as needed. Patient not taking: Reported on 10/07/2020 12/10/18   Faythe Ghee, PA-C  oxyCODONE-acetaminophen (PERCOCET/ROXICET) 5-325 MG tablet Take 1-2 tablets by mouth every 6 (six) hours as needed for severe pain. 12/10/18   Fisher, Roselyn Bering, PA-C  predniSONE (STERAPRED UNI-PAK 21 TAB) 10 MG (21) TBPK tablet Take 6 pills on day one  then decrease by 1 pill each day Patient not taking: Reported on 10/07/2020 12/10/18   Faythe Ghee, PA-C    Inpatient Medications: Scheduled Meds: .  stroke: mapping our early stages of recovery book   Does not apply Once  . Chlorhexidine Gluconate Cloth  6 each Topical Daily  . nicotine  21 mg Transdermal Daily  . pantoprazole  40 mg Oral Daily  . sodium chloride flush  3 mL Intravenous Once   Continuous Infusions: .  methylPREDNISolone (SOLU-MEDROL) injection 250 mg (10/12/20 2225)   PRN Meds: acetaminophen **OR** acetaminophen (TYLENOL) oral liquid 160 mg/5 mL **OR** acetaminophen, butalbital-acetaminophen-caffeine, labetalol, ondansetron, phenol, senna-docusate  Allergies:    Allergies  Allergen Reactions  . Codeine Shortness Of Breath  . Aspirin Other (See Comments)    "burns" the patient's stomach  . Latex Other (See Comments)    Makes the "skin peel"   . Other Nausea Only and Other (See Comments)    Spicy foods- "Upset the patient's stomach"    Social History:   Social History   Socioeconomic History  . Marital status: Married    Spouse name: Not on file  . Number of children: Not on file  . Years of education: Not on file  . Highest education level: Not on file  Occupational History  . Not on file  Tobacco Use  . Smoking status: Current Every Day Smoker    Packs/day: 1.00    Years: 15.00    Pack years: 15.00    Types: Cigarettes  . Smokeless tobacco: Never Used  Vaping Use  . Vaping Use: Never used  Substance and Sexual Activity  . Alcohol use: No  . Drug use: No  . Sexual activity: Never  Other Topics Concern  . Not on file  Social History Narrative  . Not on file   Social Determinants of Health   Financial Resource Strain: Not on file  Food Insecurity: Not on file  Transportation Needs: Not on file  Physical Activity: Not on file  Stress: Not on file  Social Connections: Not on file  Intimate Partner Violence: Not on file    Family History:   History reviewed. No pertinent family history.   ROS:  Review of Systems: [y] = yes, [ ]  = no       General: Weight gain [ ] ; Weight loss [ ] ; Anorexia [ ] ; Fatigue [ ] ; Fever [ ] ; Chills [ ] ; Weakness [ ]     Cardiac: Chest pain/pressure [y]; Resting SOB [ ] ; Exertional SOB [ ] ; Orthopnea [ ] ; Pedal Edema [ ] ; Palpitations [ ] ; Syncope [ ] ; Presyncope [ ] ; Paroxysmal nocturnal dyspnea [ ]     Pulmonary: Cough [ ] ;  Wheezing [ ] ; Hemoptysis [ ] ; Sputum [ ] ; Snoring [ ]     GI: Vomiting [ ] ; Dysphagia [ ] ; Melena [ ] ; Hematochezia [ ] ; Heartburn [ ] ; Abdominal pain [ ] ; Constipation [ ] ; Diarrhea [ ] ; BRBPR [ ]     GU: Hematuria [ ] ; Dysuria [ ] ; Nocturia [ ]   Vascular: Pain in legs with walking [ ] ; Pain in feet with lying flat [ ] ; Non-healing sores [ ] ; Stroke [ ] ; TIA [ ] ; Slurred speech [ ] ;    Neuro: Headaches [ ] ; Vertigo [ ] ; Seizures [ ] ; Paresthesias [ ] ;Blurred vision [ ] ; Diplopia [ ] ; Vision changes [ ]     Ortho/Skin: Arthritis [ ] ; Joint pain [ ] ; Muscle pain [ ] ; Joint swelling [ ] ; Back  Pain [ ] ; Rash [ ]     Psych: Depression [ ] ; Anxiety [ ]     Heme: Bleeding problems [ ] ; Clotting disorders [ ] ; Anemia [ ]     Endocrine: Diabetes [ ] ; Thyroid dysfunction [ ]    Physical Exam/Data:   Vitals:   10/12/20 1231 10/12/20 1614 10/12/20 2006 10/12/20 2336  BP: 120/76 117/79 120/81 117/75  Pulse: (!) 58 76 (!) 55 (!) 54  Resp: 20 20 16 20   Temp: 98.8 F (37.1 C) 98.2 F (36.8 C) 98 F (36.7 C) 98.1 F (36.7 C)  TempSrc: Oral Oral Oral Oral  SpO2: 97% 98% 96% 97%  Weight:      Height:        Intake/Output Summary (Last 24 hours) at 10/13/2020 0422 Last data filed at 10/13/2020 0330 Gross per 24 hour  Intake 417 ml  Output --  Net 417 ml   Last 3 Weights 10/07/2020 10/07/2020 12/10/2018  Weight (lbs) 216 lb 0.8 oz 213 lb 6.5 oz 192 lb  Weight (kg) 98 kg 96.8 kg 87.091 kg     Body mass index is 34.87 kg/m.  General:  Well nourished, well developed, in no acute distress HEENT: normal Lymph: no adenopathy Neck: no JVD Endocrine:  No thryomegaly Vascular: No carotid bruits; FA pulses 2+ bilaterally without bruits  Cardiac:  normal S1, S2; RRR; no murmur  Lungs:  clear to auscultation bilaterally, no wheezing, rhonchi or rales  Abd: soft, nontender, no hepatomegaly  Ext: no edema Musculoskeletal:  No deformities, BUE and BLE strength normal and equal Skin: warm and dry   Neuro:  CNs 2-12 intact, LUE 3/5 strength and decreased sensation, otherwise 5/5 strength throughout  Psych:  Normal affect    EKG:  The EKG was personally reviewed and demonstrates: biV paced rhythm, TWI in precordial leads on 01/30 (15:15), repeated ECG after temporarily inhibiting pacing and TWI have resolved however now in trigeminy, ECG (01/31 at 05:25:19 otherwise similar to distant prior ECG in 2014 before CRTD placement)  Telemetry:  Telemetry was personally reviewed and demonstrates:  biV paced rhythm 70s  Relevant CV Studies: She had a pharmacologic myocardial perfusion imaging study in March 2019 with no significant ischemia.  She had transthoracic echocardiogram on 10/09/2020 with EF 30 to 35%, global hypokinesis, mildly dilated LV, mild LVH, grade 1 diastolic dysfunction, and mild left atrial enlargement.  Laboratory Data:  High Sensitivity Troponin:   Recent Labs  Lab 10/07/20 1535 10/12/20 1543 10/12/20 1741 10/12/20 2036  TROPONINIHS 24* 17 20* 22*     Chemistry Recent Labs  Lab 10/08/20 0443 10/12/20 0022 10/13/20 0152  NA 137 136 138  K 4.0 4.0 4.2  CL 109 103 106  CO2 19* 22 21*  GLUCOSE 105* 107* 150*  BUN 22 24* 20  CREATININE 0.84 0.96 0.96  CALCIUM 8.7* 9.5 9.5  GFRNONAA >60 >60 >60  ANIONGAP 9 11 11     Recent Labs  Lab 10/07/20 0953  PROT 6.9  ALBUMIN 3.8  AST 21  ALT 17  ALKPHOS 62  BILITOT 0.7   Hematology Recent Labs  Lab 10/08/20 0443 10/12/20 0022 10/13/20 0152  WBC 8.5 8.7 8.2  RBC 4.40 4.96 5.06  HGB 12.6 14.3 14.7  HCT 39.6 43.7 44.7  MCV 90.0 88.1 88.3  MCH 28.6 28.8 29.1  MCHC 31.8 32.7 32.9  RDW 14.6 14.6 14.7  PLT 265 267 274   BNPNo results for input(s): BNP, PROBNP in the last 168 hours.  DDimer No  results for input(s): DDIMER in the last 168 hours.  Radiology/Studies:  CT HEAD WO CONTRAST  Result Date: 10/13/2020 CLINICAL DATA:  Follow-up examination for acute stroke. EXAM: CT HEAD WITHOUT CONTRAST  TECHNIQUE: Contiguous axial images were obtained from the base of the skull through the vertex without intravenous contrast. COMPARISON:  Prior head CT from 10/09/2020 as well as earlier studies. FINDINGS: Brain: Continued interval evolution of patchy right MCA distribution infarcts involving the right insula and overlying posterior right frontal parietal cortex, stable in size and distribution as compared to previous exam. No evidence for hemorrhagic transformation or significant regional mass effect. Hyperdensity within the right sylvian fissure is decreased from previous exam, likely a small amount of persistent subarachnoid hemorrhage. No new intracranial hemorrhage or large vessel territory infarct. No mass lesion, mass effect, or midline shift. No hydrocephalus or extra-axial fluid collection. Vascular: Decreasing hyperdensity at the right sylvian fissure without definite new hyperdense vessel. Skull: Scalp soft tissues and calvarium within normal limits. Sinuses/Orbits: Globes and orbital soft tissues demonstrate no acute finding. Paranasal sinuses and mastoid air cells remain clear. Other: None. IMPRESSION: 1. Continued interval evolution of patchy right MCA distribution infarcts, stable in size and distribution as compared to previous exam. No evidence for hemorrhagic transformation or significant regional mass effect. 2. Persistent but decreased hyperdensity within the right Sylvian fissure, likely a small amount of subarachnoid hemorrhage. 3. No other new acute intracranial abnormality. Electronically Signed   By: Rise Mu M.D.   On: 10/13/2020 01:36   CT HEAD WO CONTRAST  Addendum Date: 10/09/2020   ADDENDUM REPORT: 10/09/2020 13:45 ADDENDUM: These results were called by telephone at the time of interpretation on 10/09/2020 at 1:45 pm to provider [facility] , who verbally acknowledged these results. Electronically Signed   By: Stana Bunting M.D.   On: 10/09/2020 13:45   Result  Date: 10/09/2020 CLINICAL DATA:  Stroke, follow up EXAM: CT HEAD WITHOUT CONTRAST TECHNIQUE: Contiguous axial images were obtained from the base of the skull through the vertex without intravenous contrast. COMPARISON:  10/07/2020 and prior. FINDINGS: Brain: Hyperdense material along the right sylvian fissure is unchanged. Hypodense regions involving the right frontoparietal and right temporal lobes are concerning for subacute infarcts. No midline shift, mass lesion or ventriculomegaly. Vascular: No definite hyperdense vessel on this exam. No unexpected calcification. Skull: Negative for fracture or focal lesion. Sinuses/Orbits: Normal orbits. Clear paranasal sinuses. No mastoid effusion. Other: None. IMPRESSION: Subacute right frontoparietal and right temporal infarcts. Hyperdense material within the right sylvian fissure is unchanged. Electronically Signed: By: Stana Bunting M.D. On: 10/09/2020 13:34   DG CHEST PORT 1 VIEW  Result Date: 10/12/2020 CLINICAL DATA:  Chest pain. EXAM: PORTABLE CHEST 1 VIEW COMPARISON:  11/16/2017 FINDINGS: Persistent low lung volumes. Multi lead left-sided pacemaker in place. Mild cardiomegaly stable. Unchanged mediastinal contours. Aortic atherosclerosis. Scarring at the right lung base unchanged. No acute airspace disease. No pleural fluid or pneumothorax. Remote right rib fracture again seen. No acute osseous abnormalities. IMPRESSION: Low lung volumes without acute abnormality. Electronically Signed   By: Narda Rutherford M.D.   On: 10/12/2020 21:14   ECHOCARDIOGRAM COMPLETE  Result Date: 10/09/2020    ECHOCARDIOGRAM REPORT   Patient Name:   ADYLIN HANKEY Date of Exam: 10/09/2020 Medical Rec #:  [id number]        Height:       66.0 in Accession #:    [id number]       Weight:  216.0 lb Date of Birth:  [birth year]        BSA:          2.067 m Patient Age:    64 years         BP:           119/68 mmHg Patient Gender: F                HR:           72 bpm. Exam  Location:  Inpatient Procedure: 2D Echo, Cardiac Doppler and Color Doppler Indications:    Stroke 434.91 / I163.9  History:        Patient has prior history of Echocardiogram examinations, most                 recent 11/16/2017. CHF, CAD and Previous Myocardial Infarction,                 Defibrillator, COPD; Risk Factors:Hypertension and Current                 Smoker. Nonischemic cardiomyopathy.  Sonographer:    Leta Jungling RDCS Referring Phys: [id number] SRISHTI L BHAGAT IMPRESSIONS  1. Left ventricular ejection fraction, by estimation, is 30 to 35%. The left ventricle has moderately decreased function. The left ventricle demonstrates global hypokinesis. The left ventricular internal cavity size was mildly dilated. There is mild left ventricular hypertrophy. Left ventricular diastolic parameters are consistent with Grade I diastolic dysfunction (impaired relaxation).  2. Right ventricular systolic function is mildly reduced. The right ventricular size is normal. Tricuspid regurgitation signal is inadequate for assessing PA pressure.  3. Left atrial size was mildly dilated.  4. The mitral valve is normal in structure. No evidence of mitral valve regurgitation. No evidence of mitral stenosis.  5. The aortic valve is tricuspid. Aortic valve regurgitation is mild. No aortic stenosis is present.  6. The inferior vena cava is normal in size with greater than 50% respiratory variability, suggesting right atrial pressure of 3 mmHg. FINDINGS  Left Ventricle: Left ventricular ejection fraction, by estimation, is 30 to 35%. The left ventricle has moderately decreased function. The left ventricle demonstrates global hypokinesis. The left ventricular internal cavity size was mildly dilated. There is mild left ventricular hypertrophy. Left ventricular diastolic parameters are consistent with Grade I diastolic dysfunction (impaired relaxation). Right Ventricle: The right ventricular size is normal. No increase in right  ventricular wall thickness. Right ventricular systolic function is mildly reduced. Tricuspid regurgitation signal is inadequate for assessing PA pressure. Left Atrium: Left atrial size was mildly dilated. Right Atrium: Right atrial size was normal in size. Pericardium: Trivial pericardial effusion is present. Mitral Valve: The mitral valve is normal in structure. Mild mitral annular calcification. No evidence of mitral valve regurgitation. No evidence of mitral valve stenosis. Tricuspid Valve: The tricuspid valve is normal in structure. Tricuspid valve regurgitation is not demonstrated. Aortic Valve: The aortic valve is tricuspid. Aortic valve regurgitation is mild. No aortic stenosis is present. Pulmonic Valve: The pulmonic valve was normal in structure. Pulmonic valve regurgitation is not visualized. Aorta: The aortic root is normal in size and structure. Venous: The inferior vena cava is normal in size with greater than 50% respiratory variability, suggesting right atrial pressure of 3 mmHg. IAS/Shunts: No atrial level shunt detected by color flow Doppler. Additional Comments: A pacer wire is visualized in the right ventricle.  LEFT VENTRICLE PLAX 2D LVIDd:         5.70 cm  Diastology LVIDs:         4.90 cm      LV e' medial:    3.05 cm/s LV PW:         1.20 cm      LV E/e' medial:  11.1 LV IVS:        1.30 cm      LV e' lateral:   4.24 cm/s LVOT diam:     2.20 cm      LV E/e' lateral: 8.0 LV SV:         52 LV SV Index:   25 LVOT Area:     3.80 cm  LV Volumes (MOD) LV vol d, MOD A2C: 159.0 ml LV vol d, MOD A4C: 196.0 ml LV vol s, MOD A2C: 88.5 ml LV vol s, MOD A4C: 116.0 ml LV SV MOD A2C:     70.5 ml LV SV MOD A4C:     196.0 ml LV SV MOD BP:      70.7 ml LEFT ATRIUM             Index       RIGHT ATRIUM           Index LA diam:        4.50 cm 2.18 cm/m  RA Area:     12.10 cm LA Vol (A2C):   54.6 ml 26.42 ml/m RA Volume:   24.00 ml  11.61 ml/m LA Vol (A4C):   52.3 ml 25.31 ml/m LA Biplane Vol: 54.0 ml  26.13 ml/m  AORTIC VALVE LVOT Vmax:   84.20 cm/s LVOT Vmean:  62.400 cm/s LVOT VTI:    0.136 m  AORTA Ao Root diam: 3.70 cm Ao Asc diam:  3.50 cm MITRAL VALVE MV Area (PHT): 2.13 cm    SHUNTS MV Decel Time: 356 msec    Systemic VTI:  0.14 m MV E velocity: 33.80 cm/s  Systemic Diam: 2.20 cm MV A velocity: 56.10 cm/s MV E/A ratio:  0.60 Marca Ancona MD Electronically signed by Marca Ancona MD Signature Date/Time: 10/09/2020/11:32:51 PM    Final    Assessment and Plan:   Ms. 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 She was found to have an acute R MCA CVA requiring emergency thrombectomy.  Medicine was consulted for chest pain and contacted me regarding TWI on ECG and additional recommendations.  On discussion with Ms. Akkerman she has had persistent mild to moderate stabbing chest pain that has been fairly constant over the past 36 hours.  She had mildly elevated troponins (17->20->22) and new T wave inversions throughout her precordial leads from yesterday.  On physical exam she has pleuritic chest pain with deep inspiration and reproducible worsening of her pain with palpation over her left subcostal region.  She feels better when she lays on her right side and worse when she lays on her left.  She said that she may feel slightly worse sitting upright and cannot tell a significant difference when leaning forward.  She has a Environmental manager CRT-D and I inhibited pacing to obtain repeat ECG with programmer which showed resolution of precordial T wave inversions which is trigeminy.  Her presentation, lab work, and ECG all seen consistent with myopericarditis for which high-dose NSAIDs and colchicine would be  standard treatment.  Her CRP was elevated (4.0) along with mildly elevated  troponins and TWI that normalized after the acute period.  Her primary team started high-dose methyl Pred last night in addition to Toradol as needed and other adjunctive treatments for her severe headache.  Given that she is already on high-dose methyl Pred I think the addition of colchicine will be the one additional intervention that may improve her symptoms. Wt (98 kg), sCr (0.96) so would start colchicine 0.6 mg PO bid. Obviously this is for symptom relief so if she starts to develop abdominal complaints or diarrhea would decrease to 0.6 mg PO daily. Usually tx for 90 days if no side effects to medication. She will need OP cardiology f/u at discharge and follows with [facility]. If still sx after completion of steroid dosing per primary team then could transition to NSAIDs with colchicine until sx improve. Please reach out if CP is not improving with steroids/colchicine.   [facility] will sign off.   Medication Recommendations: colchicine 0.6 mg PO bid  Other recommendations (labs, testing, etc): repeat CRP as OP  Follow up as an outpatient: follows with [facility] cardiology, please reach out if she would rather follow up locally before discharge   For questions or updates, please contact [facility] Please consult [email or web address] for contact info under   Signed, Linton Rump, MD  10/13/2020 4:22 AM

## 2020-10-13 NOTE — Progress Notes (Signed)
Rebecca Fennel, MD  Physician  Physical Medicine and Rehabilitation  Consult Note      Signed  Date of Service:  10/10/2020  5:13 AM      Related encounter: ED to Hosp-Admission (Current) from 10/07/2020 in [place] [facility] [facility]       Signed      Expand All Collapse All     Show:Clear all [x] Manual[x] Template[] Copied  Added by: [x] Singh, , PA-C[x] , MD   [] Hover for details           Physical Medicine and Rehabilitation Consult Reason for Consult: Left side weakness Referring Physician: Dr.Xu   HPI: Rebecca Singh is a 65 y.o. 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  History taken from chart review and patient.  Patient lives with son and grandchildren/sister and elderly father.  Independent with assistive device.  Mobile home with 3 steps to entry.  She presented on 10/07/2020 with acute left hemiparesis.  Noted blood pressure in the 190s. Admission chemistries unremarkable except glucose 109, creatinine 1.03, SARS coronavirus negative, troponin 24, urine drug screen negative.  CT of the head showed hypodensity right frontal parietal lobe suspicious for acute infarct in addition there was a probable small hyperdense vessel in the right sylvian fissure involving the right MCA.  CT angiogram of head and neck right M2/MCA posterior division branch occlusion with moderate distal branch opacification.  No hemodynamically significant stenosis in the neck.  Patient underwent revascularization per interventional radiology.  Follow-up CT the head showed small volume hyperdensity within the right sylvian fissure reflecting extravasated contrast and/or subarachnoid hemorrhage.  Possible subtle acute infarct changes in the right frontal  parietal lobe.  Echocardiogram with ejection fraction of 30-35%, grade 1 diastolic dysfunction.  Neurology consulted with work-up ongoing and considering low-dose aspirin in the upcoming days if neuro remained stable and SAH resolves. Cardiology service consulted to do ICD interrogation for A. fib.  She is currently maintained on a regular consistency diet.  Due to patient's left hemiparesis and decrease in functional mobility physical medicine rehab consult requested.   Review of Systems  Constitutional: Positive for malaise/fatigue. Negative for chills and fever.  HENT: Negative for hearing loss.   Eyes: Negative for blurred vision and double vision.  Respiratory: Negative for cough and shortness of breath.   Cardiovascular: Negative for chest pain and palpitations.  Gastrointestinal: Positive for constipation. Negative for heartburn, nausea and vomiting.  Genitourinary: Negative for dysuria, flank pain and hematuria.  Musculoskeletal: Positive for myalgias.  Skin: Negative for rash.  Neurological: Positive for sensory change, focal weakness and weakness.  All other systems reviewed and are negative.       Past Medical History:  Diagnosis Date  . AICD (automatic cardioverter/defibrillator) present    . COPD (chronic obstructive pulmonary disease) (HCC)    . Hypothyroidism    . Myocardial infarction (HCC)    . Pacemaker/defibrillator    . Stroke [facility])           Past Surgical History:  Procedure Laterality Date  . ABDOMINAL HYSTERECTOMY      . CHOLECYSTECTOMY      . INSERT / REPLACE / REMOVE PACEMAKER      . IR PERCUTANEOUS ART THROMBECTOMY/INFUSION INTRACRANIAL INC DIAG ANGIO   10/07/2020  . RADIOLOGY WITH ANESTHESIA N/A 10/07/2020  Procedure: IR WITH ANESTHESIA - CODE STROKE;  Surgeon: Radiologist, Medication, MD;  Location: [facility] OR;  Service: Radiology;  Laterality: N/A;  . THYROIDECTOMY      . TONSILLECTOMY        History reviewed. No pertinent family history. Social History:   reports that she has been smoking cigarettes. She has a 15.00 pack-year smoking history. She has never used smokeless tobacco. She reports that she does not drink alcohol and does not use drugs. Allergies:       Allergies  Allergen Reactions  . Codeine Shortness Of Breath  . Aspirin Other (See Comments)      "burns" the patient's stomach  . Latex Other (See Comments)      Makes the "skin peel"    . Other Nausea Only and Other (See Comments)      Spicy foods- "Upset the patient's stomach"          Medications Prior to Admission  Medication Sig Dispense Refill  . ibuprofen (ADVIL) 200 MG tablet Take 200-400 mg by mouth every 6 (six) hours as needed (for knee and back pain).      Marland Kitchen NEXIUM 24HR 20 MG capsule Take 20 mg by mouth daily before breakfast.      . aspirin EC 81 MG EC tablet Take 1 tablet (81 mg total) by mouth daily. (Patient not taking: No sig reported)      . cyclobenzaprine (FLEXERIL) 10 MG tablet Take 1 tablet (10 mg total) by mouth 3 (three) times daily as needed. (Patient not taking: No sig reported) 30 tablet 0  . metoprolol tartrate (LOPRESSOR) 25 MG tablet Take 1 tablet (25 mg total) by mouth 2 (two) times daily. (Patient not taking: No sig reported) 30 tablet 0  . nicotine (NICODERM CQ - DOSED IN MG/24 HOURS) 21 mg/24hr patch Place 1 patch (21 mg total) onto the skin daily. (Patient not taking: No sig reported) 28 patch 0  . ondansetron (ZOFRAN-ODT) 4 MG disintegrating tablet Take 1 tablet (4 mg total) by mouth every 8 (eight) hours as needed. (Patient not taking: Reported on 10/07/2020) 20 tablet 0  . oxyCODONE-acetaminophen (PERCOCET/ROXICET) 5-325 MG tablet Take 1-2 tablets by mouth every 6 (six) hours as needed for severe pain. 30 tablet 0  . predniSONE (STERAPRED UNI-PAK 21 TAB) 10 MG (21) TBPK tablet Take 6 pills on day one then decrease by 1 pill each day (Patient not taking: Reported on 10/07/2020) 21 tablet 0      Home: Home Living Family/patient expects to be  discharged to:: Private residence Living Arrangements:  (Father, sister, grandchildren) Available Help at Discharge: Available 24 hours/day Type of Home: Mobile home Home Access: Stairs to enter Entrance Stairs-Rails: Right Home Layout: One level Bathroom Shower/Tub: Health visitor: Standard Bathroom Accessibility: Yes Home Equipment: Engineer, production - 2 wheels,Bedside commode Additional Comments: father that is 59 yo lives in the home as well, x 3 boston terriers Psychologist, educational ( triple D- Devil [employer]) [name], grandson 34 granddaughter 75 yo oldest son, sister  Lives With: Family  Functional History: Prior Function Level of Independence: Independent with assistive device(s) Comments: 3 months s/p L TKA- Functional Status:  Mobility: Bed Mobility Overal bed mobility: Needs Assistance Bed Mobility: Rolling,Supine to Sit Rolling: Supervision Supine to sit: Min assist Sit to supine: Max assist General bed mobility comments: pt requires hand over hand to reach for bed rail on L side. Transfers Overall transfer level: Needs assistance Equipment used: Rolling walker (2 wheeled) Transfers: Sit  to/from Stand Sit to Stand: Min assist General transfer comment: pt able to power up but with knee flexion and foward posture. pt needs physical (A) to help extend L knee while standing. pt with BIL UE on back of chair to complete static standing. pt noted to have head hands and legs tremor with movement. Ambulation/Gait Ambulation/Gait assistance: Min assist Gait Distance (Feet): 20 Feet (addtional trial of 15 feet) Assistive device: Rolling walker (2 wheeled),1 person hand held assist Gait Pattern/deviations: Decreased dorsiflexion - left,Decreased weight shift to left,Step-to pattern,Decreased stance time - left,Decreased stride length,Decreased step length - right,Decreased step length - left General Gait Details: pt had difficulty grasping walker with L hand and pushed through her  forearm on the L side. On intial ambulation utilized walker but on additional trial attempted to progress to 1 person hand held assist. pt reported feeling more comfortable and steady with Rw and requested to switch back. Gait velocity: reduced Gait velocity interpretation: <1.31 ft/sec, indicative of household ambulator   ADL: ADL Overall ADL's : Needs assistance/impaired Grooming: Moderate assistance,Sitting Upper Body Bathing: Moderate assistance,Bed level Lower Body Bathing: Maximal assistance,Sit to/from stand Toilet Transfer: +2 for physical assistance,Moderate assistance Toilet Transfer Details (indicate cue type and reason): simulated eob to stand simualted BSC General ADL Comments: pt very guarded and fear of falling   Cognition: Cognition Overall Cognitive Status: Within Functional Limits for tasks assessed Arousal/Alertness: Awake/alert Orientation Level: Oriented X4 Attention: Focused,Alternating Focused Attention: Impaired Focused Attention Impairment: Verbal complex,Functional complex Alternating Attention: Impaired Alternating Attention Impairment: Verbal complex,Functional complex Memory: Impaired Memory Impairment: Decreased recall of new information,Decreased short term memory Awareness: Appears intact Executive Function: Organizing,Self Monitoring,Sequencing Sequencing: Impaired Sequencing Impairment: Verbal complex,Functional complex Organizing: Impaired Organizing Impairment: Verbal complex,Functional complex Self Monitoring: Impaired Safety/Judgment: Appears intact Cognition Arousal/Alertness: Awake/alert Behavior During Therapy: WFL for tasks assessed/performed Overall Cognitive Status: Within Functional Limits for tasks assessed   Blood pressure 127/65, pulse (!) 52, temperature 97.8 F (36.6 C), temperature source Oral, resp. rate 18, height 5\' 6"  (1.676 m), weight 98 kg, SpO2 99 %. Physical Exam Vitals reviewed.  Constitutional:      General: She  is not in acute distress.    Appearance: She is obese.  HENT:     Head: Normocephalic and atraumatic.     Right Ear: External ear normal.     Left Ear: External ear normal.     Nose: Nose normal.  Eyes:     General:        Right eye: No discharge.        Left eye: No discharge.     Extraocular Movements: Extraocular movements intact.  Cardiovascular:     Rate and Rhythm: Normal rate and regular rhythm.  Pulmonary:     Effort: Pulmonary effort is normal. No respiratory distress.     Breath sounds: No stridor.  Abdominal:     General: Abdomen is flat. Bowel sounds are normal. There is no distension.  Musculoskeletal:     Cervical back: Normal range of motion and neck supple.     Comments: No edema or tenderness in extremities  Skin:    General: Skin is warm and dry.  Neurological:     Mental Status: She is alert.     Comments: Alert Makes eye contact with examiner.   Oriented to person and place.   Follows simple commands.   Fair awareness of deficits. Motor: RUE: 3/5 proximal distal LUE: 2+/5 proximal distal RLE: 3 -/5 proximal distal LLE: 2 -/  5 proximal distal Sensation diminished to light touch in left fingers  Psychiatric:        Mood and Affect: Mood normal.        Behavior: Behavior normal.        Lab Results Last 24 Hours  No results found for this or any previous visit (from the past 24 hour(s)).    Imaging Results (Last 48 hours)  CT HEAD WO CONTRAST   Addendum Date: 10/09/2020   ADDENDUM REPORT: 10/09/2020 13:45 ADDENDUM: These results were called by telephone at the time of interpretation on 10/09/2020 at 1:45 pm to provider [facility] , who verbally acknowledged these results. Electronically Signed   By: Stana Bunting M.D.   On: 10/09/2020 13:45    Result Date: 10/09/2020 CLINICAL DATA:  Stroke, follow up EXAM: CT HEAD WITHOUT CONTRAST TECHNIQUE: Contiguous axial images were obtained from the base of the skull through the vertex without intravenous  contrast. COMPARISON:  10/07/2020 and prior. FINDINGS: Brain: Hyperdense material along the right sylvian fissure is unchanged. Hypodense regions involving the right frontoparietal and right temporal lobes are concerning for subacute infarcts. No midline shift, mass lesion or ventriculomegaly. Vascular: No definite hyperdense vessel on this exam. No unexpected calcification. Skull: Negative for fracture or focal lesion. Sinuses/Orbits: Normal orbits. Clear paranasal sinuses. No mastoid effusion. Other: None. IMPRESSION: Subacute right frontoparietal and right temporal infarcts. Hyperdense material within the right sylvian fissure is unchanged. Electronically Signed: By: Stana Bunting M.D. On: 10/09/2020 13:34    ECHOCARDIOGRAM COMPLETE   Result Date: 10/09/2020    ECHOCARDIOGRAM REPORT   Patient Name:   LIYANNA CARTWRIGHT Date of Exam: 10/09/2020 Medical Rec #:  [id number]        Height:       66.0 in Accession #:    [id number]       Weight:       216.0 lb Date of Birth:  Sep 06, 1956        BSA:          2.067 m Patient Age:    64 years         BP:           119/68 mmHg Patient Gender: F                HR:           72 bpm. Exam Location:  Inpatient Procedure: 2D Echo, Cardiac Doppler and Color Doppler Indications:    Stroke 434.91 / I163.9  History:        Patient has prior history of Echocardiogram examinations, most                 recent 11/16/2017. CHF, CAD and Previous Myocardial Infarction,                 Defibrillator, COPD; Risk Factors:Hypertension and Current                 Smoker. Nonischemic cardiomyopathy.  Sonographer:    Leta Jungling RDCS Referring Phys: [id number] SRISHTI L BHAGAT IMPRESSIONS  1. Left ventricular ejection fraction, by estimation, is 30 to 35%. The left ventricle has moderately decreased function. The left ventricle demonstrates global hypokinesis. The left ventricular internal cavity size was mildly dilated. There is mild left ventricular hypertrophy. Left ventricular diastolic  parameters are consistent with Grade I diastolic dysfunction (impaired relaxation).  2. Right ventricular systolic function is mildly reduced. The right ventricular size is normal. Tricuspid regurgitation signal is inadequate  for assessing PA pressure.  3. Left atrial size was mildly dilated.  4. The mitral valve is normal in structure. No evidence of mitral valve regurgitation. No evidence of mitral stenosis.  5. The aortic valve is tricuspid. Aortic valve regurgitation is mild. No aortic stenosis is present.  6. The inferior vena cava is normal in size with greater than 50% respiratory variability, suggesting right atrial pressure of 3 mmHg. FINDINGS  Left Ventricle: Left ventricular ejection fraction, by estimation, is 30 to 35%. The left ventricle has moderately decreased function. The left ventricle demonstrates global hypokinesis. The left ventricular internal cavity size was mildly dilated. There is mild left ventricular hypertrophy. Left ventricular diastolic parameters are consistent with Grade I diastolic dysfunction (impaired relaxation). Right Ventricle: The right ventricular size is normal. No increase in right ventricular wall thickness. Right ventricular systolic function is mildly reduced. Tricuspid regurgitation signal is inadequate for assessing PA pressure. Left Atrium: Left atrial size was mildly dilated. Right Atrium: Right atrial size was normal in size. Pericardium: Trivial pericardial effusion is present. Mitral Valve: The mitral valve is normal in structure. Mild mitral annular calcification. No evidence of mitral valve regurgitation. No evidence of mitral valve stenosis. Tricuspid Valve: The tricuspid valve is normal in structure. Tricuspid valve regurgitation is not demonstrated. Aortic Valve: The aortic valve is tricuspid. Aortic valve regurgitation is mild. No aortic stenosis is present. Pulmonic Valve: The pulmonic valve was normal in structure. Pulmonic valve regurgitation is not  visualized. Aorta: The aortic root is normal in size and structure. Venous: The inferior vena cava is normal in size with greater than 50% respiratory variability, suggesting right atrial pressure of 3 mmHg. IAS/Shunts: No atrial level shunt detected by color flow Doppler. Additional Comments: A pacer wire is visualized in the right ventricle.  LEFT VENTRICLE PLAX 2D LVIDd:         5.70 cm      Diastology LVIDs:         4.90 cm      LV e' medial:    3.05 cm/s LV PW:         1.20 cm      LV E/e' medial:  11.1 LV IVS:        1.30 cm      LV e' lateral:   4.24 cm/s LVOT diam:     2.20 cm      LV E/e' lateral: 8.0 LV SV:         52 LV SV Index:   25 LVOT Area:     3.80 cm  LV Volumes (MOD) LV vol d, MOD A2C: 159.0 ml LV vol d, MOD A4C: 196.0 ml LV vol s, MOD A2C: 88.5 ml LV vol s, MOD A4C: 116.0 ml LV SV MOD A2C:     70.5 ml LV SV MOD A4C:     196.0 ml LV SV MOD BP:      70.7 ml LEFT ATRIUM             Index       RIGHT ATRIUM           Index LA diam:        4.50 cm 2.18 cm/m  RA Area:     12.10 cm LA Vol (A2C):   54.6 ml 26.42 ml/m RA Volume:   24.00 ml  11.61 ml/m LA Vol (A4C):   52.3 ml 25.31 ml/m LA Biplane Vol: 54.0 ml 26.13 ml/m  AORTIC VALVE LVOT Vmax:  84.20 cm/s LVOT Vmean:  62.400 cm/s LVOT VTI:    0.136 m  AORTA Ao Root diam: 3.70 cm Ao Asc diam:  3.50 cm MITRAL VALVE MV Area (PHT): 2.13 cm    SHUNTS MV Decel Time: 356 msec    Systemic VTI:  0.14 m MV E velocity: 33.80 cm/s  Systemic Diam: 2.20 cm MV A velocity: 56.10 cm/s MV E/A ratio:  0.60 Marca Ancona MD Electronically signed by Marca Ancona MD Signature Date/Time: 10/09/2020/11:32:51 PM    Final        Assessment/Plan: Diagnosis: Right frontal parietal lobe infarct right sylvian subarachnoid hemorrhage -?  Appears to have bilateral deficits.   Stroke: Continue secondary stroke prophylaxis and Risk Factor Modification listed below:   Antiplatelet therapy:   Blood Pressure Management:  Continue current medication with prn's with permisive  HTN per primary team Statin Agent:   Prediabetes management:   Tobacco abuse:   ?  Quadriparesis: fit for orthosis to prevent contractures (resting hand splint for day, wrist cock up splint at night, PRAFO, etc) PT/OT for mobility, ADL training  Motor recovery: Fluoxetine Labs independently reviewed.  Records reviewed and summated above.   1. Does the need for close, 24 hr/day medical supervision in concert with the patient's rehab needs make it unreasonable for this patient to be served in a less intensive setting? Yes  2. 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 Due to safety, disease management and patient education, does the patient require 24 hr/day rehab nursing? Yes 4. Does the patient require coordinated care of a physician, rehab nurse, therapy disciplines of PT/OT to address physical and functional deficits in the context of the above medical diagnosis(es)? Yes Addressing deficits in the following areas: balance, endurance, locomotion, strength, transferring, bathing, dressing, toileting and psychosocial support 5. Can the patient actively participate in an intensive therapy program of at least 3 hrs of therapy per day at least 5 days per week? Yes 6. The potential for patient to make measurable gains while on inpatient rehab is excellent 7. Anticipated functional outcomes upon discharge from inpatient rehab are min assist  with PT, min assist with OT,  n/a with SLP. 8. Estimated rehab length of stay to reach the above functional goals is: 14-18 days. 9. Anticipated discharge destination: Home 10. Overall Rehab/Functional Prognosis: good   RECOMMENDATIONS: This patient's condition is appropriate for continued rehabilitative care in the following setting: CIR if adequate caregiver support available upon discharge. Patient has agreed to participate in recommended program. Yes Note that insurance prior authorization may be required for reimbursement for recommended care.   Comment: Rehab Admissions Coordinator to follow up.   I have personally performed a face to face diagnostic evaluation, including, but not limited to relevant history and physical exam findings, of this patient and developed relevant assessment and plan.  Additionally, I have reviewed and concur with the physician assistant's documentation above.    Rebecca Morrow, MD, ABPMR Rebecca Rossetti Angiulli, PA-C 10/10/2020          Revision History  Routing History                   Note Details  Author Allena Katz, Maryln Gottron, MD File Time 10/10/2020  2:11 PM  Author Type Physician Status Signed  Last Editor Rebecca Fennel, MD Service Physical Medicine and Rehabilitation

## 2020-10-13 NOTE — TOC Transition Note (Signed)
Transition of Care [facility]) - CM/SW Discharge Note   Patient Details  Name: Rebecca Singh MRN: [id number] Date of Birth: [birth year]  [facility] CM/SW Contact:  Kermit Balo, RN Phone Number: 10/13/2020, 2:20 PM   Clinical Narrative:    Pt discharging to CIR today. CM signing off.   Final next level of care: IP Rehab Facility Barriers to Discharge: No Barriers Identified   Patient Goals and CMS Choice     Choice offered to / list presented to : Patient  Discharge Placement                       Discharge Plan and Services                                     Social Determinants of Health (SDOH) Interventions     Readmission Risk Interventions No flowsheet data found.

## 2020-10-13 NOTE — Progress Notes (Signed)
Inpatient Rehabilitation Admissions Coordinator   I have insurance approval and CIR bed to admit pt to today. I met with patient at bedside with her sister. They are in agreement.. I spoke with Dr Leonie Man, alerted acute team and TOC. I will make the arrangements to admit today.  Danne Baxter, RN, MSN Rehab Admissions Coordinator [phone] 10/13/2020 11:28 AM

## 2020-10-13 NOTE — Hospital Course (Signed)
Dear [name],  You presented to hospital after stroke and had a procedure after that, called thrombectomy. To do list: 1.  Continue taking baby aspirin 81 mg daily. 2.  Please make an appointment with outpatient neurologist in 4 to 6 weeks after discharge from inpatient rehabilitation. 3.  Please continue taking colchicine 0.6 mg daily for chest pain 4.  Please follow-up with cardiologist as needed after discharge 5.  Please see your PCP within 1 to 2 weeks after discharge.  It was pleasure taking care of you! Dr. Gerarda Fraction

## 2020-10-13 NOTE — Progress Notes (Signed)
PROGRESS NOTE    Rebecca Singh  DUK:[id number] DOB: 08-28-1956 DOA: 10/07/2020 PCP: [facility]   Brief Narrative:  Rebecca Singh is an 65 y.o. female nonischemic cardiomyopathy with EF 30 to 35% and CRT-defibrillator, COPD, chronic pain, peripartum CVA, prediabetes, tobacco abuse, and prior history of polysubstance abuse, who presented to the emergency department as code stroke on 10/07/2020 with severe headache, left-sided weakness, and dysarthria, was found to have acute ischemic right MCA stroke, intra-arterial thrombectomy, was admitted to the neurology service, accepted to CIR on 10/10/2020, has been complaining of ongoing headaches, and developed pain in the lower chest last night. Patient reports that she has been experiencing severe headaches since the admission, has some partial and short lasting relief with acetaminophen and Fioricet, and then developed discomfort in the lower chest last night.  Chest discomfort has been constant, localized to the lower chest in the center, worse with certain arm movements and when she begins to sit up, and also worse with palpation.  She had never experienced this previously.  There is no associated shortness of breath, cough, nausea, or diaphoresis. She had a pharmacologic myocardial perfusion imaging study in March 2019 with no significant ischemia.  She had transthoracic echocardiogram on 10/09/2020 with EF 30 to 35%, global hypokinesis, mildly dilated LV, mild LVH, grade 1 diastolic dysfunction, and mild left atrial enlargement. She had EKG performed this evening with AV dual paced rhythm and T wave inversions.  High-sensitivity troponin was 17 and then 20 this evening.  Hospitalist service was consulted by neurology for evaluation.    Assessment & Plan:   Active Problems:   Chest pain   Acute ischemic right MCA stroke (HCC)   Middle cerebral artery embolism, right   SAH (subarachnoid hemorrhage) (HCC)   Tobacco abuse   Chronic  pain syndrome   Prediabetes   Nonischemic cardiomyopathy (HCC)   AICD (automatic cardioverter/defibrillator) present   Acute myopericarditis   Musculoskeletal chest pain, ACS ruled out Rule out myopericarditis  - defer to cardiology for any further cardiac testing - troponin flat; currently recommending high-dose NSAIDs and colchicine.  - Recommend symptom control as above with NSAIDs and colchicine.  Subjective: Reproducible chest tightness appears to be ongoing, patient can reproduce pain today with deep inspiration or palpation.  Otherwise denies nausea vomiting diarrhea constipation headache fevers or chills.  Objective: Vitals:   10/12/20 1614 10/12/20 2006 10/12/20 2336 10/13/20 0540  BP: 117/79 120/81 117/75 127/66  Pulse: 76 (!) 55 (!) 54 (!) 54  Resp: 20 16 20 16   Temp: 98.2 F (36.8 C) 98 F (36.7 C) 98.1 F (36.7 C) 97.7 F (36.5 C)  TempSrc: Oral Oral Oral Oral  SpO2: 98% 96% 97% 95%  Weight:      Height:        Intake/Output Summary (Last 24 hours) at 10/13/2020 0739 Last data filed at 10/13/2020 0330 Gross per 24 hour  Intake 417 ml  Output -  Net 417 ml   Filed Weights   10/07/20 1100 10/07/20 1700  Weight: 96.8 kg 98 kg    Examination:  General:  Pleasantly resting in bed, No acute distress. HEENT:  Normocephalic atraumatic.  Sclerae nonicteric, noninjected.  Extraocular movements intact bilaterally. Neck:  Without mass or deformity.  Trachea is midline. Lungs: Pleuritic chest pain, reproducible PMI at the lower sternal border.  Otherwise clear to auscultate bilaterally without rhonchi, wheeze, or rales. Heart:  Regular rate and rhythm.  Without murmurs, rubs, or gallops. Abdomen:  Soft, nontender, nondistended.  Without guarding or rebound. Extremities: Without cyanosis, clubbing, edema, or obvious deformity. Vascular:  Dorsalis pedis and posterior tibial pulses palpable bilaterally. Skin:  Warm and dry, no erythema, no ulcerations.    Data  Reviewed: I have personally reviewed following labs and imaging studies  CBC: Recent Labs  Lab 10/07/20 0953 10/07/20 0959 10/08/20 0443 10/12/20 0022 10/13/20 0152  WBC 6.3  --  8.5 8.7 8.2  NEUTROABS 3.7  --  7.0  --   --   HGB 14.6 15.0 12.6 14.3 14.7  HCT 46.2* 44.0 39.6 43.7 44.7  MCV 91.1  --  90.0 88.1 88.3  PLT 288  --  265 267 274   Basic Metabolic Panel: Recent Labs  Lab 10/07/20 0953 10/07/20 0959 10/08/20 0443 10/12/20 0022 10/13/20 0152  NA 138 139 137 136 138  K 4.4 4.3 4.0 4.0 4.2  CL 107 107 109 103 106  CO2 22  --  19* 22 21*  GLUCOSE 109* 101* 105* 107* 150*  BUN 29* 33* 22 24* 20  CREATININE 1.03* 1.00 0.84 0.96 0.96  CALCIUM 9.3  --  8.7* 9.5 9.5   GFR: Estimated Creatinine Clearance: 69 mL/min (by C-G formula based on SCr of 0.96 mg/dL). Liver Function Tests: Recent Labs  Lab 10/07/20 0953  AST 21  ALT 17  ALKPHOS 62  BILITOT 0.7  PROT 6.9  ALBUMIN 3.8   No results for input(s): LIPASE, AMYLASE in the last 168 hours. No results for input(s): AMMONIA in the last 168 hours. Coagulation Profile: Recent Labs  Lab 10/07/20 0953  INR 1.0   Cardiac Enzymes: No results for input(s): CKTOTAL, CKMB, CKMBINDEX, TROPONINI in the last 168 hours. BNP (last 3 results) No results for input(s): PROBNP in the last 8760 hours. HbA1C: No results for input(s): HGBA1C in the last 72 hours. CBG: Recent Labs  Lab 10/07/20 0954  GLUCAP 101*   Lipid Profile: No results for input(s): CHOL, HDL, LDLCALC, TRIG, CHOLHDL, LDLDIRECT in the last 72 hours. Thyroid Function Tests: No results for input(s): TSH, T4TOTAL, FREET4, T3FREE, THYROIDAB in the last 72 hours. Anemia Panel: No results for input(s): VITAMINB12, FOLATE, FERRITIN, TIBC, IRON, RETICCTPCT in the last 72 hours. Sepsis Labs: No results for input(s): PROCALCITON, LATICACIDVEN in the last 168 hours.  Recent Results (from the past 240 hour(s))  SARS Coronavirus 2 by RT PCR (hospital order,  performed in [facility] hospital lab) Nasopharyngeal Nasopharyngeal Swab     Status: None   Collection Time: 10/07/20 10:39 AM   Specimen: Nasopharyngeal Swab  Result Value Ref Range Status   SARS Coronavirus 2 NEGATIVE NEGATIVE Final    Comment: (NOTE) SARS-CoV-2 target nucleic acids are NOT DETECTED.  The SARS-CoV-2 RNA is generally detectable in upper and lower respiratory specimens during the acute phase of infection. The lowest concentration of SARS-CoV-2 viral copies this assay can detect is 250 copies / mL. A negative result does not preclude SARS-CoV-2 infection and should not be used as the sole basis for treatment or other patient management decisions.  A negative result may occur with improper specimen collection / handling, submission of specimen other than nasopharyngeal swab, presence of viral mutation(s) within the areas targeted by this assay, and inadequate number of viral copies (<250 copies / mL). A negative result must be combined with clinical observations, patient history, and epidemiological information.  Fact Sheet for Patients:   BoilerBrush.com.cy  Fact Sheet for Healthcare Providers: https://pope.com/  This test is not yet  approved or  cleared by the [place] and has been authorized for detection and/or diagnosis of SARS-CoV-2 by FDA under an Emergency Use Authorization (EUA).  This EUA will remain in effect (meaning this test can be used) for the duration of the COVID-19 declaration under Section 564(b)(1) of the Act, 21 U.S.C. section 360bbb-3(b)(1), unless the authorization is terminated or revoked sooner.  Performed at [facility], [address], [place], [place] [address]   MRSA PCR Screening     Status: None   Collection Time: 10/07/20  5:20 PM   Specimen: Nasal Mucosa; Nasopharyngeal  Result Value Ref Range Status   MRSA by PCR NEGATIVE NEGATIVE Final    Comment:        The  GeneXpert MRSA Assay (FDA approved for NASAL specimens only), is one component of a comprehensive MRSA colonization surveillance program. It is not intended to diagnose MRSA infection nor to guide or monitor treatment for MRSA infections. Performed at [facility], [address], [place], [place] [address]          Radiology Studies: CT HEAD WO CONTRAST  Result Date: 10/13/2020 CLINICAL DATA:  Follow-up examination for acute stroke. EXAM: CT HEAD WITHOUT CONTRAST TECHNIQUE: Contiguous axial images were obtained from the base of the skull through the vertex without intravenous contrast. COMPARISON:  Prior head CT from 10/09/2020 as well as earlier studies. FINDINGS: Brain: Continued interval evolution of patchy right MCA distribution infarcts involving the right insula and overlying posterior right frontal parietal cortex, stable in size and distribution as compared to previous exam. No evidence for hemorrhagic transformation or significant regional mass effect. Hyperdensity within the right sylvian fissure is decreased from previous exam, likely a small amount of persistent subarachnoid hemorrhage. No new intracranial hemorrhage or large vessel territory infarct. No mass lesion, mass effect, or midline shift. No hydrocephalus or extra-axial fluid collection. Vascular: Decreasing hyperdensity at the right sylvian fissure without definite new hyperdense vessel. Skull: Scalp soft tissues and calvarium within normal limits. Sinuses/Orbits: Globes and orbital soft tissues demonstrate no acute finding. Paranasal sinuses and mastoid air cells remain clear. Other: None. IMPRESSION: 1. Continued interval evolution of patchy right MCA distribution infarcts, stable in size and distribution as compared to previous exam. No evidence for hemorrhagic transformation or significant regional mass effect. 2. Persistent but decreased hyperdensity within the right Sylvian fissure, likely a small amount of  subarachnoid hemorrhage. 3. No other new acute intracranial abnormality. Electronically Signed   By: Rise Mu M.D.   On: 10/13/2020 01:36   DG CHEST PORT 1 VIEW  Result Date: 10/12/2020 CLINICAL DATA:  Chest pain. EXAM: PORTABLE CHEST 1 VIEW COMPARISON:  11/16/2017 FINDINGS: Persistent low lung volumes. Multi lead left-sided pacemaker in place. Mild cardiomegaly stable. Unchanged mediastinal contours. Aortic atherosclerosis. Scarring at the right lung base unchanged. No acute airspace disease. No pleural fluid or pneumothorax. Remote right rib fracture again seen. No acute osseous abnormalities. IMPRESSION: Low lung volumes without acute abnormality. Electronically Signed   By: Narda Rutherford M.D.   On: 10/12/2020 21:14    Scheduled Meds: .  stroke: mapping our early stages of recovery book   Does not apply Once  . Chlorhexidine Gluconate Cloth  6 each Topical Daily  . nicotine  21 mg Transdermal Daily  . pantoprazole  40 mg Oral Daily  . sodium chloride flush  3 mL Intravenous Once   Continuous Infusions: . methylPREDNISolone (SOLU-MEDROL) injection 250 mg (10/12/20 2225)  LOS: 6 days   Time spent:  Azucena Fallen, DO [facility]  If 7PM-7AM, please contact night-coverage [email or web address]  10/13/2020, 7:39 AM

## 2020-10-13 NOTE — H&P (Signed)
Physical Medicine and Rehabilitation Admission H&P    Chief Complaint  Patient presents with  . 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 Per chart review patient lives with son and grandchildren sister and elderly father. Independent with assistive device. Mobile home 3 steps to entry. She presented 10/07/2020 with acute onset of left-sided weakness. Noted blood pressure in the 190s. Admission chemistries unremarkable except glucose 109 creatinine 1.03, SARS coronavirus negative, troponin XX 4 urine drug screen negative. CT of the head showed hypodensity right frontal parietal lobe suspicious for acute infarction in addition there was a probable small hyperdense vessel in the right sylvian fissure involving the right MCA. CT angiogram of head and neck right M2/MCA posterior division branch occlusion with moderate distal branch opacification. No hemodynamically significant stenosis in the neck. Patient underwent revascularization per interventional radiology. Follow-up CT of the head showed small volume hyperdensity within the right sylvian fissure reflecting extravasated contrast and/or subarachnoid hemorrhage. Possible subtle acute infarct changes in the right frontal parietal lobe. Echocardiogram with ejection fraction of 30 to 35% grade 1 diastolic dysfunction.  Follow-up cranial CT scan 10/13/2020 showed continued interval evolution of patchy right MCA distribution infarct stable in size and distribution as compared to previous exam.  No evidence of hemorrhagic transformation or significant mass-effect.  Persistent but decreased hyperdensity within the right sylvian fissure likely a small amount of  subarachnoid hemorrhage.  Patient was cleared to begin low-dose aspirin for CVA prophylaxis.  Cardiology services consulted for both ICD interrogation for A. fib as well as nonspecific chest discomfort. EKG completed similar to distant prior EKGs.biV rhythm 70s.. EKG consistent with myopericarditis for which she takes high-dose nonsteroidals and colchicine in the past and currently completing a course of Solu-Medrol. Troponin was completed 24-17-20-22. Chest discomfort felt to be more pleuritic in nature. Cardiology did sign off no further work-up currently indicated. She is tolerating a regular consistency diet. Due to patient's left side weakness and decrease in functional ability physical medicine rehab consult requested and patient was admitted for a comprehensive rehab program.  Review of Systems  Constitutional: Positive for malaise/fatigue. Negative for fever.  HENT: Negative for hearing loss.   Eyes: Negative for blurred vision and double vision.  Respiratory: Positive for shortness of breath.   Cardiovascular: Positive for chest pain and leg swelling.  Gastrointestinal: Positive for constipation. Negative for heartburn, nausea and vomiting.  Genitourinary: Negative for dysuria, flank pain and hematuria.  Musculoskeletal: Positive for myalgias.  Skin: Negative for rash.  Neurological: Positive for sensory change, focal weakness, weakness and headaches.  All other systems reviewed and are negative.  Past Medical History:  Diagnosis Date  . AICD (automatic cardioverter/defibrillator) present   . COPD (chronic obstructive pulmonary disease) (HCC)   . Hypothyroidism   . Myocardial infarction (HCC)   . Pacemaker/defibrillator   . Stroke [facility](HCC)    Past Surgical History:  Procedure Laterality Date  . ABDOMINAL HYSTERECTOMY    . CHOLECYSTECTOMY    . INSERT / REPLACE / REMOVE PACEMAKER    . IR PERCUTANEOUS ART THROMBECTOMY/INFUSION INTRACRANIAL INC DIAG ANGIO  10/07/2020  . RADIOLOGY WITH  ANESTHESIA N/A 10/07/2020   Procedure: IR WITH ANESTHESIA - CODE STROKE;  Surgeon: Radiologist, Medication, MD;  Location: [facility] OR;  Service: Radiology;  Laterality: N/A;  . THYROIDECTOMY    . TONSILLECTOMY     History reviewed. No pertinent family history. Social History:  reports that she has been smoking cigarettes. She has a 15.00 pack-year smoking history. She has never used smokeless tobacco. She reports that she does not drink alcohol and does not use drugs. Allergies:  Allergies  Allergen Reactions  . Codeine Shortness Of Breath  . Aspirin Other (See Comments)    "burns" the patient's stomach  . Latex Other (See Comments)    Makes the "skin peel"   . Other Nausea Only and Other (See Comments)    Spicy foods- "Upset the patient's stomach"   Medications Prior to Admission  Medication Sig Dispense Refill  . ibuprofen (ADVIL) 200 MG tablet Take 200-400 mg by mouth every 6 (six) hours as needed (for knee and back pain).    Marland Kitchen NEXIUM 24HR 20 MG capsule Take 20 mg by mouth daily before breakfast.    . aspirin EC 81 MG EC tablet Take 1 tablet (81 mg total) by mouth daily. (Patient not taking: No sig reported)    . cyclobenzaprine (FLEXERIL) 10 MG tablet Take 1 tablet (10 mg total) by mouth 3 (three) times daily as needed. (Patient not taking: No sig reported) 30 tablet 0  . metoprolol tartrate (LOPRESSOR) 25 MG tablet Take 1 tablet (25 mg total) by mouth 2 (two) times daily. (Patient not taking: No sig reported) 30 tablet 0  . nicotine (NICODERM CQ - DOSED IN MG/24 HOURS) 21 mg/24hr patch Place 1 patch (21 mg total) onto the skin daily. (Patient not taking: No sig reported) 28 patch 0  . ondansetron (ZOFRAN-ODT) 4 MG disintegrating tablet Take 1 tablet (4 mg total) by mouth every 8 (eight) hours as needed. (Patient not taking: Reported on 10/07/2020) 20 tablet 0  . oxyCODONE-acetaminophen (PERCOCET/ROXICET) 5-325 MG tablet Take 1-2 tablets by mouth every 6 (six) hours as needed for severe  pain. 30 tablet 0  . predniSONE (STERAPRED UNI-PAK 21 TAB) 10 MG (21) TBPK tablet Take 6 pills on day one then decrease by 1 pill each day (Patient not taking: Reported on 10/07/2020) 21 tablet 0    Drug Regimen Review Drug regimen was reviewed and remains appropriate with no significant issues identified  Home: Home Living Family/patient expects to be discharged to:: Private residence Living Arrangements:  (Father, sister, grandchildren) Available Help at Discharge: Available 24 hours/day Type of Home: Mobile home Home Access: Stairs to enter Entrance Stairs-Rails: Right Home Layout: One level Bathroom Shower/Tub: Health visitor: Standard Bathroom Accessibility: Yes Home Equipment: Engineer, production - 2 wheels,Bedside commode Additional Comments: father that is 56 yo lives in the home as well, x 3 boston terriers Psychologist, educational ( triple D- Devil Dog) [name], grandson 28 granddaughter 72 yo oldest son, sister  Lives With: Family   Functional History: Prior Function Level of Independence: Independent with assistive device(s) Comments: 3 months s/p L TKA-  Functional Status:  Mobility: Bed Mobility Overal bed mobility: Needs Assistance Bed Mobility: Supine to Sit Rolling: Supervision Supine to sit: Min guard,HOB elevated Sit to supine: Max assist General bed mobility comments: Pt able to initiate leg movement off bed and was provided cues to push up on L arm, success with min guard and HOB elevated. Extra time to square hips with EOB. Transfers Overall transfer level: Needs assistance Equipment used: Rolling walker (2 wheeled) Transfers: Sit to/from Stand Sit to Stand: Min assist General transfer comment: Cued pt  to push up from current surface 1x from bed and 1x from commode. Assistance and cues to attend to L hand for placement. Tactile cues at L quad to extend knee. Ambulation/Gait Ambulation/Gait assistance: Min assist Gait Distance (Feet): 20 Feet (x2  bouts) Assistive device: Rolling walker (2 wheeled),1 person hand held assist Gait Pattern/deviations: Decreased dorsiflexion - left,Decreased weight shift to left,Decreased stance time - left,Decreased stride length,Decreased step length - right,Decreased step length - left,Step-through pattern,Decreased dorsiflexion - right,Shuffle,Trunk flexed,Wide base of support General Gait Details: pt had difficulty grasping walker with L hand and pushed through her forearm on the L side. Required repeated cues to attend to L hand and correct positioning or to attend to L side of RW or L foot to avoid obstaceles. Cued pt to increase bil feet clearance due to bil foot drag, min-no success. Gait velocity: reduced Gait velocity interpretation: <1.31 ft/sec, indicative of household ambulator    ADL: ADL Overall ADL's : Needs assistance/impaired Grooming: Moderate assistance,Sitting Upper Body Bathing: Moderate assistance,Bed level Lower Body Bathing: Maximal assistance,Sit to/from stand Toilet Transfer: +2 for physical assistance,Moderate assistance Toilet Transfer Details (indicate cue type and reason): simulated eob to stand simualted BSC General ADL Comments: pt very guarded and fear of falling  Cognition: Cognition Overall Cognitive Status: Impaired/Different from baseline Arousal/Alertness: Awake/alert Orientation Level: Oriented X4 Attention: Focused,Alternating Focused Attention: Impaired Focused Attention Impairment: Verbal complex,Functional complex Alternating Attention: Impaired Alternating Attention Impairment: Verbal complex,Functional complex Memory: Impaired Memory Impairment: Decreased recall of new information,Decreased short term memory Awareness: Appears intact Executive Function: Organizing,Self Monitoring,Sequencing Sequencing: Impaired Sequencing Impairment: Verbal complex,Functional complex Organizing: Impaired Organizing Impairment: Verbal complex,Functional complex Self  Monitoring: Impaired Safety/Judgment: Appears intact Cognition Arousal/Alertness: Awake/alert Behavior During Therapy: WFL for tasks assessed/performed Overall Cognitive Status: Impaired/Different from baseline Area of Impairment: Awareness,Problem solving,Safety/judgement,Memory Memory: Decreased recall of precautions,Decreased short-term memory Safety/Judgement: Decreased awareness of safety,Decreased awareness of deficits Awareness: Emergent Problem Solving: Slow processing,Difficulty sequencing,Requires verbal cues General Comments: Pt required repeated cues to remain attentive to L side, but still leaving or neglecting L side often. Extra time to process cues and acknowledge safety concerns.  Physical Exam: Blood pressure 127/66, pulse (!) 54, temperature 97.7 F (36.5 C), temperature source Oral, resp. rate 16, height 5\' 6"  (1.676 m), weight 98 kg, SpO2 95 %. Physical Exam Constitutional:      General: She is not in acute distress.    Appearance: She is obese.  HENT:     Head: Normocephalic.     Right Ear: External ear normal.     Left Ear: External ear normal.     Nose: Nose normal.     Mouth/Throat:     Mouth: Mucous membranes are moist.     Pharynx: Oropharynx is clear.  Eyes:     Extraocular Movements: Extraocular movements intact.     Pupils: Pupils are equal, round, and reactive to light.  Cardiovascular:     Rate and Rhythm: Normal rate and regular rhythm.     Heart sounds: No murmur heard. No gallop.   Pulmonary:     Effort: Pulmonary effort is normal. No respiratory distress.     Breath sounds: Normal breath sounds. No wheezing or rales.  Abdominal:     General: Bowel sounds are normal. There is no distension.  Musculoskeletal:        General: No swelling or tenderness.     Cervical back: Normal range of motion.  Skin:    General: Skin is warm and dry.  Neurological:  Mental Status: She is alert.     Comments: Patient is alert in no acute distress.  Oriented to person place and time. Follows commands. Fair insight and awareness. Left homonymous hemianopsia. Photosensitive. Blurry visions without diplopia. Speech clear. LUE apraxic but groswly 3-4/5. LLl apraxic 2-3/5. Inconsistent effort. Sensation 1/2 left face, arm, leg.  DTRs 2+.  Psychiatric:        Mood and Affect: Mood normal.     Results for orders placed or performed during the hospital encounter of 10/07/20 (from the past 48 hour(s))  CBC     Status: None   Collection Time: 10/12/20 12:22 AM  Result Value Ref Range   WBC 8.7 4.0 - 10.5 K/uL   RBC 4.96 3.87 - 5.11 MIL/uL   Hemoglobin 14.3 12.0 - 15.0 g/dL   HCT 51.8 84.1 - 66.0 %   MCV 88.1 80.0 - 100.0 fL   MCH 28.8 26.0 - 34.0 pg   MCHC 32.7 30.0 - 36.0 g/dL   RDW 63.0 16.0 - 10.9 %   Platelets 267 150 - 400 K/uL   nRBC 0.0 0.0 - 0.2 %    Comment: Performed at [facility], [address] [place], [place] [address]  Basic metabolic panel     Status: Abnormal   Collection Time: 10/12/20 12:22 AM  Result Value Ref Range   Sodium 136 135 - 145 mmol/L   Potassium 4.0 3.5 - 5.1 mmol/L   Chloride 103 98 - 111 mmol/L   CO2 22 22 - 32 mmol/L   Glucose, Bld 107 (H) 70 - 99 mg/dL    Comment: Glucose reference range applies only to samples taken after fasting for at least 8 hours.   BUN 24 (H) 8 - 23 mg/dL   Creatinine, Ser 7.32 0.44 - 1.00 mg/dL   Calcium 9.5 8.9 - 20.2 mg/dL   GFR, Estimated >54 >27 mL/min    Comment: (NOTE) Calculated using the CKD-EPI Creatinine Equation (2021)    Anion gap 11 5 - 15    Comment: Performed at [facility], [address], [place], [place] [address]  Troponin I (High Sensitivity)     Status: None   Collection Time: 10/12/20  3:43 PM  Result Value Ref Range   Troponin I (High Sensitivity) 17 <18 ng/L    Comment: (NOTE) Elevated high sensitivity troponin I (hsTnI) values and significant  changes across serial measurements may suggest ACS but many other  chronic and acute  conditions are known to elevate hsTnI results.  Refer to the "Links" section for chest pain algorithms and additional  guidance. Performed at [facility], [address] [place], [place] [address]   C-reactive protein     Status: Abnormal   Collection Time: 10/12/20  3:43 PM  Result Value Ref Range   CRP 4.0 (H) <1.0 mg/dL    Comment: Performed at [facility], [address] [place], [place] [address]  Sedimentation rate     Status: None   Collection Time: 10/12/20  3:43 PM  Result Value Ref Range   Sed Rate 15 0 - 22 mm/hr    Comment: Performed at [facility], [address], [place], [place] [address]  Troponin I (High Sensitivity)     Status: Abnormal   Collection Time: 10/12/20  5:41 PM  Result Value Ref Range   Troponin I (High Sensitivity) 20 (H) <18 ng/L    Comment: (NOTE) Elevated high sensitivity troponin  I (hsTnI) values and significant  changes across serial measurements may suggest ACS but many other  chronic and acute conditions are known to elevate hsTnI results.  Refer to the "Links" section for chest pain algorithms and additional  guidance. Performed at [facility], [address] [place], [place] [address]   Troponin I (High Sensitivity)     Status: Abnormal   Collection Time: 10/12/20  8:36 PM  Result Value Ref Range   Troponin I (High Sensitivity) 22 (H) <18 ng/L    Comment: (NOTE) Elevated high sensitivity troponin I (hsTnI) values and significant  changes across serial measurements may suggest ACS but many other  chronic and acute conditions are known to elevate hsTnI results.  Refer to the "Links" section for chest pain algorithms and additional  guidance. Performed at [facility], [address], [place], [place] [address]   CBC     Status: None   Collection Time: 10/13/20  1:52 AM  Result Value Ref Range   WBC 8.2 4.0 - 10.5 K/uL   RBC 5.06 3.87 - 5.11 MIL/uL   Hemoglobin 14.7 12.0 - 15.0 g/dL   HCT 76.2  26.3 - 33.5 %   MCV 88.3 80.0 - 100.0 fL   MCH 29.1 26.0 - 34.0 pg   MCHC 32.9 30.0 - 36.0 g/dL   RDW 45.6 25.6 - 38.9 %   Platelets 274 150 - 400 K/uL   nRBC 0.0 0.0 - 0.2 %    Comment: Performed at [facility], [address] [place], [place] [address]  Basic metabolic panel     Status: Abnormal   Collection Time: 10/13/20  1:52 AM  Result Value Ref Range   Sodium 138 135 - 145 mmol/L   Potassium 4.2 3.5 - 5.1 mmol/L   Chloride 106 98 - 111 mmol/L   CO2 21 (L) 22 - 32 mmol/L   Glucose, Bld 150 (H) 70 - 99 mg/dL    Comment: Glucose reference range applies only to samples taken after fasting for at least 8 hours.   BUN 20 8 - 23 mg/dL   Creatinine, Ser 8.76 0.44 - 1.00 mg/dL   Calcium 9.5 8.9 - 81.1 mg/dL   GFR, Estimated >57 >26 mL/min    Comment: (NOTE) Calculated using the CKD-EPI Creatinine Equation (2021)    Anion gap 11 5 - 15    Comment: Performed at [facility], [address], [place], [place] [address]   CT HEAD WO CONTRAST  Result Date: 10/13/2020 CLINICAL DATA:  Follow-up examination for acute stroke. EXAM: CT HEAD WITHOUT CONTRAST TECHNIQUE: Contiguous axial images were obtained from the base of the skull through the vertex without intravenous contrast. COMPARISON:  Prior head CT from 10/09/2020 as well as earlier studies. FINDINGS: Brain: Continued interval evolution of patchy right MCA distribution infarcts involving the right insula and overlying posterior right frontal parietal cortex, stable in size and distribution as compared to previous exam. No evidence for hemorrhagic transformation or significant regional mass effect. Hyperdensity within the right sylvian fissure is decreased from previous exam, likely a small amount of persistent subarachnoid hemorrhage. No new intracranial hemorrhage or large vessel territory infarct. No mass lesion, mass effect, or midline shift. No hydrocephalus or extra-axial fluid collection. Vascular: Decreasing hyperdensity  at the right sylvian fissure without definite new hyperdense vessel. Skull: Scalp soft tissues and calvarium within normal limits. Sinuses/Orbits: Globes and orbital soft tissues demonstrate no acute finding. Paranasal sinuses and  mastoid air cells remain clear. Other: None. IMPRESSION: 1. Continued interval evolution of patchy right MCA distribution infarcts, stable in size and distribution as compared to previous exam. No evidence for hemorrhagic transformation or significant regional mass effect. 2. Persistent but decreased hyperdensity within the right Sylvian fissure, likely a small amount of subarachnoid hemorrhage. 3. No other new acute intracranial abnormality. Electronically Signed   By: Rise Mu M.D.   On: 10/13/2020 01:36   DG CHEST PORT 1 VIEW  Result Date: 10/12/2020 CLINICAL DATA:  Chest pain. EXAM: PORTABLE CHEST 1 VIEW COMPARISON:  11/16/2017 FINDINGS: Persistent low lung volumes. Multi lead left-sided pacemaker in place. Mild cardiomegaly stable. Unchanged mediastinal contours. Aortic atherosclerosis. Scarring at the right lung base unchanged. No acute airspace disease. No pleural fluid or pneumothorax. Remote right rib fracture again seen. No acute osseous abnormalities. IMPRESSION: Low lung volumes without acute abnormality. Electronically Signed   By: Narda Rutherford M.D.   On: 10/12/2020 21:14       Medical Problem List and Plan: 1. Left side weakness secondary to acute ischemic right MCA infarction status post IR revascularization  -patient may  shower  -ELOS/Goals: 7-10 days, moe I to supervision with PT, OT 2.  Antithrombotics: -DVT/anticoagulation: SCDs  -antiplatelet therapy: Low-dose aspirin 81 mg daily initiated 10/13/2020 3. Pain Management/chronic back pain: Topamax 25 mg twice daily, Fioricet as needed 4. Mood: Provide emotional support  -antipsychotic agents: N/A 5. Neuropsych: This patient is capable of making decisions on her own behalf. 6.  Skin/Wound Care: Routine skin checks 7. Fluids/Electrolytes/Nutrition: Routine in and outs with follow-up chemistries 8. Permissive hypertension. Monitor with increased mobility 9. COPD /tobacco abuse. NicoDerm patch. Provide counseling 10. Nonischemic cardiomyopathy/CHF. Ejection fraction 35%. Follow-up cardiology services  -daily weights     Charlton Amor, Cordelia Poche 10/13/2020

## 2020-10-13 NOTE — Progress Notes (Signed)
Occupational Therapy Treatment Patient Details Name: Rebecca Singh MRN: [id number] DOB: 1956/01/18 Today's Date: 10/13/2020    History of present illness 65 yo female with L side weakness admitted for R MCA with revascularization TICI3 on 10/07/20. 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  Pt continues to present with decreased strength and coodination in LUE, impaired balance and decreased activity tolerance. Session focus on household distance functional mobility with RW, standing UB grooming tasks, LUE FMC therex and toileting tasks. Overall, pt requires supervision for bed mobility, MIN A - min guard assist for functional mobility with RW. Issued pt walker hand splint for LUE as pt with difficulty maintaining grasp on RW. Pt completed LUE therex as indicated below with no reports of increased pain. Pt reports decreased sensation in all digits and requires active assist to complete digit lift and opposition. Issued pt level 1 theraputty, however instructed pt to just use putty for flexion/ extension of digits as pt currently lacks the [facility] skills needed to work on full theraputty HEP. Education provided on using LUE as stabilizer during grooming tasks and provided education on compensatory methods to carryover during ADLS. Pt would continue to benefit from skilled occupational therapy while admitted and after d/c to address the below listed limitations in order to improve overall functional mobility and facilitate independence with BADL participation. DC plan remains appropriate, will follow acutely per POC.     Follow Up Recommendations  CIR    Equipment Recommendations  Other (comment) (has DME at home from L TKA)    Recommendations for Other Services      Precautions / Restrictions  Precautions Precautions: Fall Precaution Comments: L inattention Restrictions Weight Bearing Restrictions: No       Mobility Bed Mobility Overal bed mobility: Needs Assistance Bed Mobility: Supine to Sit;Sit to Supine     Supine to sit: Supervision;HOB elevated Sit to supine: Supervision;HOB elevated   General bed mobility comments: HOB elevated to 37* supervision for safety and ling mgmt  Transfers Overall transfer level: Needs assistance Equipment used: Rolling walker (2 wheeled) Transfers: Sit to/from Stand Sit to Stand: Min guard;Min assist         General transfer comment: MIN A for EOB needing cues for hand placement, MIN guard from [facility] over toilet. issued pt walker splint at end of session for LUE as pt presents with decreased grip.    Balance Overall balance assessment: Needs assistance Sitting-balance support: No upper extremity supported;Feet supported Sitting balance-Leahy Scale: Good     Standing balance support: During functional activity;No upper extremity supported Standing balance-Leahy Scale: Poor Standing balance comment: pt able to complete functional tasks at sink with no UE support with min guard for balance                           ADL either performed or assessed with clinical judgement   ADL Overall ADL's : Needs assistance/impaired     Grooming: Wash/dry hands;Standing;Oral care;Min guard;Cueing for compensatory techniques Grooming Details (indicate cue type and reason): pt able to stand at sink for UB grooming tasks, education provided on compensatory strategies for using LUE as stabilizer and incorporating LUE as much as possible into ADL routine     Lower Body Bathing: Supervison/ safety;Sitting/lateral leans Lower Body Bathing Details (indicate  cue type and reason): simulated via pericare         Toilet Transfer: Minimal assistance;RW;Ambulation;BSC;Regular Toilet;Grab bars Toilet Transfer Details (indicate cue type and  reason): BSC over regular toilet, cues for grab bars, MIN A for functional mobility with Rw Toileting- Clothing Manipulation and Hygiene: Supervision/safety;Sitting/lateral lean Toileting - Clothing Manipulation Details (indicate cue type and reason): anterior pericare from toilet     Functional mobility during ADLs: Minimal assistance;Rolling walker General ADL Comments: pt continues to present with decreased strength and coodination in LUE, impaired balance and decreased activity tolerance     Vision Patient Visual Report: Blurring of vision Vision Assessment?: Vision impaired- to be further tested in functional context Additional Comments: pt continues to endorse blurry vision, pt completed visual tabel top tracking task with pt instucted to start on L side of page and follow the maze until the end point. pt reports getting "lost" x2 during task and needing assist from COTA to locate where she left off   Perception     Praxis      Cognition Arousal/Alertness: Awake/alert Behavior During Therapy: WFL for tasks assessed/performed Overall Cognitive Status: Impaired/Different from baseline                               Problem Solving: Slow processing;Requires verbal cues General Comments: slow to process cues for overall seems [facility]        Exercises General Exercises - Upper Extremity Elbow Flexion: AROM;Left;5 reps;Seated Elbow Extension: AROM;Left;5 reps;Seated Hand Exercises Forearm Supination: AROM;Left;Seated;5 reps Forearm Pronation: AROM;Left;5 reps;Seated Wrist Flexion: AROM;Left;5 reps;Seated Wrist Extension: AROM;Left;5 reps;Seated Digit Composite Flexion: AROM;Left;5 reps;Other (comment) (level 1 theraputty) Composite Extension: AROM;Left;5 reps;Other (comment) (level 1 theraputty) Digit Lifts: Left;Seated;AAROM Opposition: Left;Seated;Other (comment);AAROM   Shoulder Instructions       General Comments issued pt level 1 theraputty to be used for  flexion/ extension of digits and written HEP for LUE [facility] therex. additionally set- up walker hand splint on L side of RW to be trialed d/t decreased functional grasp on LUE    Pertinent Vitals/ Pain       Pain Assessment: Faces Faces Pain Scale: Hurts a little bit Pain Location: headache Pain Descriptors / Indicators: Headache Pain Intervention(s): Monitored during session  Home Living                                          Prior Functioning/Environment              Frequency  Min 2X/week        Progress Toward Goals  OT Goals(current goals can now be found in the care plan section)  Progress towards OT goals: Progressing toward goals  Acute Rehab OT Goals Patient Stated Goal: to get better OT Goal Formulation: With patient Time For Goal Achievement: 10/22/20 Potential to Achieve Goals: Good  Plan Discharge plan remains appropriate;Frequency remains appropriate    Co-evaluation                 AM-PAC OT "6 Clicks" Daily Activity     Outcome Measure   Help from another person eating meals?: A Little (set- up) Help from another person taking care of personal grooming?: A Little Help from another person toileting, which includes using toliet, bedpan, or urinal?: A Little Help from another person bathing (including washing, rinsing,  drying)?: A Lot Help from another person to put on and taking off regular upper body clothing?: A Little Help from another person to put on and taking off regular lower body clothing?: A Lot 6 Click Score: 16    End of Session Equipment Utilized During Treatment: Gait belt;Rolling walker  OT Visit Diagnosis: Unsteadiness on feet (R26.81);Muscle weakness (generalized) (M62.81);Pain Pain - part of body:  (head ache)   Activity Tolerance Patient tolerated treatment well   Patient Left in bed;with call bell/phone within reach;with bed alarm set   Nurse Communication Mobility status        Time:  0825-0909 OT Time Calculation (min): 44 min  Charges: OT General Charges $OT Visit: 1 Visit OT Treatments $Self Care/Home Management : 8-22 mins $Therapeutic Exercise: 23-37 mins  Lenor Derrick., COTA/L Acute Rehabilitation Services [phone]    Barron Schmid 10/13/2020, 10:05 AM

## 2020-10-13 NOTE — Progress Notes (Signed)
Standley Brooking, RN  Rehab Admission Coordinator  Physical Medicine and Rehabilitation  PMR Pre-admission      Signed  Date of Service:  10/10/2020  1:45 PM      Related encounter: ED to Hosp-Admission (Current) from 10/07/2020 in [facility]       Signed          Show:Clear all [x] Manual[x] Template[x] Copied  Added by: [x] , RN   [] Hover for details  PMR Admission Coordinator Pre-Admission Assessment   Patient: Rebecca Singh is an 65 y.o., female MRN: DOB: 1956-05-08 Height: 5\' 6"  (167.6 cm) Weight: 98 kg                                                                                                                                                  Insurance Information HMO: yes    PPO:      PCP:      IPA:      80/20:      OTHER:  PRIMARY: [employer]      Policy#: [id number]      Subscriber: pt CM Name: [id number]     Phone#: [phone] ext   Fax#: [phone] Pre-Cert#: Misty Stanley   Approved for 7 days   Employer:  Benefits:  Phone #: [phone]     Name: 1/28 Eff. Date: 05/14/2020     Deduct: $233      Out of Pocket Max: $3450      Life Max: none  CIR: With Medicaid dual coverage should be covered 100%; otherwise $2524 co pay per admission if Medicaid non coverage      SNF: 100% Outpatient: 80%     Co-Pay: visits per medical neccesity Home Health: 100%      Co-Pay: visits per medical neccesity DME: 80%     Co-Pay: 20% Providers: in network  SECONDARY: [place] [employer]      Policy#: [id number]      Phone#:    Financial Counselor:       Phone#:    The "Data Collection Information Summary" for patients in Inpatient Rehabilitation Facilities with attached "Privacy Act Statement-Health Care Records" was provided and verbally reviewed with: Patient   Emergency Contact Information         Contact Information     Name Relation Home Work Mobile    [place]     [phone]     07/14/2020 Sister     [phone]       Current Medical History  Patient Admitting Diagnosis: CVA   History of Present Illness: Rebecca Singh is a 65 year old right-handed female with history of COPD/tobacco abuse, polysubstance use in the past, chronic pain, 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 Per chart review patient lives with son and grandchildren sister and elderly father. Independent with assistive device. Mobile home 3 steps to entry. She presented 10/07/2020 with acute onset of left-sided weakness. Noted blood pressure in the 190's. Admission chemistries unremarkable except glucose 109 creatinine 1.03, SARS coronavirus negative, troponin XX 4 urine drug screen negative. CT of the head showed hypodensity right frontal parietal lobe suspicious for acute infarction in addition there was a probable small hyperdense vessel in the right sylvian fissure involving the right MCA. CT angiogram of head and neck right M2/MCA posterior division branch occlusion with moderate distal branch opacification. No hemodynamically significant stenosis in the neck. Patient underwent revascularization per interventional radiology. Follow-up CT of the head showed small volume hyper density within the right sylvian fissure reflecting extravasated contrast and/or subarachnoid hemorrhage. Possible subtle acute infarct changes in the right frontal parietal lobe. Echocardiogram with ejection fraction of 30 to 35% grade 1 diastolic dysfunction. There was consideration of aspirin therapy currently on no antithrombotic due to small SAH awaiting plan for aspirin therapy with follow-up CT of the head completed 10/13/2020 showing continued interval evolution of patchy right MCA distribution infarct stable in size as compared to previous exam. No evidence for hemorrhagic  transformation or significant regional mass-effect. Persistent but decreased hyper density within the right sylvian fissure likely a small amount of subarachnoid hemorrhage. No other acute abnormality.. Cardiology services consulted for both ICD interrogation for A. fib as well as nonspecific chest discomfort. EKG completed similar to distant prior EKG's.BIV rhythm 70s.. EKG consistent with myopericarditis for which she takes high-dose nonsteroidal's and colchicine in the past and currently completing a course of Solu-Medrol. Troponin was completed 24-17-20-22. Chest discomfort felt to be more pleuritic in nature. Cardiology did sign off no further work-up currently indicated. She is tolerating a regular consistency diet   Complete NIHSS TOTAL: 4 Glasgow Coma Scale Score: 15   Past Medical History      Past Medical History:  Diagnosis Date  . AICD (automatic cardioverter/defibrillator) present    . COPD (chronic obstructive pulmonary disease) (HCC)    . Hypothyroidism    . Myocardial infarction (HCC)    . Pacemaker/defibrillator    . Stroke [facility](HCC)        Family History  family history is not on file.   Prior Rehab/Hospitalizations:  Has the patient had prior rehab or hospitalizations prior to admission? Yes   Has the patient had major surgery during 100 days prior to admission? No   Current Medications    Current Facility-Administered Medications:  .   stroke: mapping our early stages of recovery book, , Does not apply, Once, Bhagat, Srishti L, MD .  acetaminophen (TYLENOL) tablet 650 mg, 650 mg, Oral, Q4H PRN, 650 mg at 10/13/20 0806 **OR** acetaminophen (TYLENOL) 160 MG/5ML solution 650 mg, 650 mg, Oral, Q4H PRN, 650 mg at 10/12/20 1849 **OR** acetaminophen (TYLENOL) suppository 650 mg, 650 mg, Rectal, Q4H PRN, Marvel PlanXu, Jindong, MD .  aspirin EC tablet 81 mg, 81 mg, Oral, Daily, Dagar, Anjali, MD .  butalbital-acetaminophen-caffeine (FIORICET) 50-325-40 MG per tablet 2 tablet, 2 tablet,  Oral, Q6H PRN, Beryle Beam[place]qu[place], Kofi, MD, 2 tablet at 10/13/20 0544 .  Chlorhexidine Gluconate Cloth 2 % PADS 6 each, 6 each, Topical, Daily, Bhagat, Srishti L, MD, 6 each at 10/13/20 1000 .  colchicine tablet 0.6 mg, 0.6 mg, Oral, BID, Dagar, Anjali, MD, 0.6 mg at 10/13/20 1102 .  labetalol (NORMODYNE) injection 5-20 mg, 5-20 mg, Intravenous,  Q2H PRN, Marvel Plan, MD .  methylPREDNISolone sodium succinate (SOLU-MEDROL) 250 mg in sodium chloride 0.9 % 50 mL IVPB, 250 mg, Intravenous, Q12H, [place], Kofi, MD, Last Rate: 104 mL/hr at 10/13/20 0818, 250 mg at 10/13/20 0818 .  nicotine (NICODERM CQ - dosed in mg/24 hours) patch 21 mg, 21 mg, Transdermal, Daily, Dagar, Anjali, MD, 21 mg at 10/13/20 1024 .  ondansetron (ZOFRAN-ODT) disintegrating tablet 4 mg, 4 mg, Oral, Q8H PRN, Dagar, Anjali, MD, 4 mg at 10/10/20 1210 .  pantoprazole (PROTONIX) EC tablet 40 mg, 40 mg, Oral, Daily, Dagar, Anjali, MD, 40 mg at 10/13/20 1024 .  phenol (CHLORASEPTIC) mouth spray 1 spray, 1 spray, Mouth/Throat, PRN, Marvel Plan, MD .  senna-docusate (Senokot-S) tablet 1 tablet, 1 tablet, Oral, QHS PRN, Bhagat, Srishti L, MD .  sodium chloride flush (NS) 0.9 % injection 3 mL, 3 mL, Intravenous, Once, Cathren Laine, MD .  topiramate (TOPAMAX) tablet 25 mg, 25 mg, Oral, BID, Dagar, Geralynn Rile, MD   Patients Current Diet:     Diet Order                      Diet Heart Room service appropriate? Yes with Assist; Fluid consistency: Thin  Diet effective now                      Precautions / Restrictions Precautions Precautions: Fall Precaution Comments: L inattention Restrictions Weight Bearing Restrictions: No    Has the patient had 2 or more falls or a fall with injury in the past year?No   Prior Activity Level Community (5-7x/wk): Independent, driving, working part time in food prep   Prior Functional Level Prior Function Level of Independence: Independent with assistive device(s) Comments: 3 months s/p L TKA-    Self Care: Did the patient need help bathing, dressing, using the toilet or eating?  Independent   Indoor Mobility: Did the patient need assistance with walking from room to room (with or without device)? Independent   Stairs: Did the patient need assistance with internal or external stairs (with or without device)? Independent   Functional Cognition: Did the patient need help planning regular tasks such as shopping or remembering to take medications? Independent   Home Assistive Devices / Equipment Home Assistive Devices/Equipment: None Home Equipment: Shower seat,Walker - 2 wheels,Bedside commode   Prior Device Use: Indicate devices/aids used by the patient prior to current illness, exacerbation or injury? None of the above   Current Functional Level Cognition   Arousal/Alertness: Awake/alert Overall Cognitive Status: Impaired/Different from baseline Orientation Level: Oriented X4 Safety/Judgement: Decreased awareness of safety,Decreased awareness of deficits General Comments: slow to process cues for overall seems WFL Attention: Focused,Alternating Focused Attention: Impaired Focused Attention Impairment: Verbal complex,Functional complex Alternating Attention: Impaired Alternating Attention Impairment: Verbal complex,Functional complex Memory: Impaired Memory Impairment: Decreased recall of new information,Decreased short term memory Awareness: Appears intact Executive Function: Organizing,Self Monitoring,Sequencing Sequencing: Impaired Sequencing Impairment: Verbal complex,Functional complex Organizing: Impaired Organizing Impairment: Verbal complex,Functional complex Self Monitoring: Impaired Safety/Judgment: Appears intact    Extremity Assessment (includes Sensation/Coordination)   Upper Extremity Assessment: LUE deficits/detail,RUE deficits/detail RUE Deficits / Details: fingers numb and tingling, reports normal sensation at elbow to shoulder reports dropping thing  that she thinks she is holding. pt states "I am just clumspy" LUE Deficits / Details: fingers numb and tingling, reports normal sensation at elbow to shoulder LUE Sensation: decreased light touch LUE Coordination: decreased fine motor,decreased gross motor  Lower Extremity Assessment:  LLE deficits/detail,RLE deficits/detail RLE Deficits / Details: grossly 3 to 3+ with incoordination, jerks/tremulous RLE Coordination: decreased fine motor LLE Deficits / Details: numb and tingling at feet, grossly 2+/5 large muscle groups.  incoordination.  ? motor apraxias LLE Coordination: decreased fine motor,decreased gross motor     ADLs   Overall ADL's : Needs assistance/impaired Grooming: Wash/dry hands,Standing,Oral care,Min guard,Cueing for compensatory techniques Grooming Details (indicate cue type and reason): pt able to stand at sink for UB grooming tasks, education provided on compensatory strategies for using LUE as stabilizer and incorporating LUE as much as possible into ADL routine Upper Body Bathing: Moderate assistance,Bed level Lower Body Bathing: Supervison/ safety,Sitting/lateral leans Lower Body Bathing Details (indicate cue type and reason): simulated via pericare Toilet Transfer: Minimal assistance,RW,Ambulation,BSC,Regular Toilet,Grab bars Toilet Transfer Details (indicate cue type and reason): BSC over regular toilet, cues for grab bars, MIN A for functional mobility with Rw Toileting- Clothing Manipulation and Hygiene: Supervision/safety,Sitting/lateral lean Toileting - Clothing Manipulation Details (indicate cue type and reason): anterior pericare from toilet Functional mobility during ADLs: Minimal assistance,Rolling walker General ADL Comments: pt continues to present with decreased strength and coodination in LUE, impaired balance and decreased activity tolerance     Mobility   Overal bed mobility: Needs Assistance Bed Mobility: Supine to Sit,Sit to Supine Rolling:  Supervision Supine to sit: Supervision,HOB elevated Sit to supine: Supervision,HOB elevated General bed mobility comments: HOB elevated to 37* supervision for safety and ling mgmt     Transfers   Overall transfer level: Needs assistance Equipment used: Rolling walker (2 wheeled) Transfers: Sit to/from Stand Sit to Stand: Min guard,Min assist General transfer comment: MIN A for EOB needing cues for hand placement, MIN guard from [facility] over toilet. issued pt walker splint at end of session for LUE as pt presents with decreased grip.     Ambulation / Gait / Stairs / Wheelchair Mobility   Ambulation/Gait Ambulation/Gait assistance: Editor, commissioning (Feet): 20 Feet (x2 bouts) Assistive device: Rolling walker (2 wheeled),1 person hand held assist Gait Pattern/deviations: Decreased dorsiflexion - left,Decreased weight shift to left,Decreased stance time - left,Decreased stride length,Decreased step length - right,Decreased step length - left,Step-through pattern,Decreased dorsiflexion - right,Shuffle,Trunk flexed,Wide base of support General Gait Details: pt had difficulty grasping walker with L hand and pushed through her forearm on the L side. Required repeated cues to attend to L hand and correct positioning or to attend to L side of RW or L foot to avoid obstaceles. Cued pt to increase bil feet clearance due to bil foot drag, min-no success. Gait velocity: reduced Gait velocity interpretation: <1.31 ft/sec, indicative of household ambulator     Posture / Balance Balance Overall balance assessment: Needs assistance Sitting-balance support: No upper extremity supported,Feet supported Sitting balance-Leahy Scale: Good Standing balance support: During functional activity,No upper extremity supported Standing balance-Leahy Scale: Poor Standing balance comment: pt able to complete functional tasks at sink with no UE support with min guard for balance     Special needs/care consideration  Hgb A1c 6.1 Designated visitor is Diane AICD    Previous Home Environment  Living Arrangements:  (Father, sister, grandchildren)  Lives With: Family Available Help at Discharge: Available 24 hours/day Type of Home: Mobile home Home Layout: One level Home Access: Stairs to enter Entrance Stairs-Rails: Right Bathroom Shower/Tub: Health visitor: Standard Bathroom Accessibility: Yes How Accessible: Accessible via walker Home Care Services: No Additional Comments: father that is 19 yo lives in the home as well, x 3 boston  terriers Psychologist, educational ( triple D- [employer]) [name], grandson 55 granddaughter 37 yo oldest son, sister   Discharge Living Setting Plans for Discharge Living Setting: Patient's home,Lives with (comment),Mobile Home (father, sister, son and grandchildren) Type of Home at Discharge: Mobile home Discharge Home Layout: One level Discharge Home Access: Stairs to enter Entrance Stairs-Rails: Right Entrance Stairs-Number of Steps: 4 to 5 Discharge Bathroom Shower/Tub: Walk-in shower Discharge Bathroom Toilet: Standard Discharge Bathroom Accessibility: Yes How Accessible: Accessible via walker Does the patient have any problems obtaining your medications?: No   Social/Family/Support Systems Contact Information: Arlys John, son Anticipated Caregiver: family Anticipated Caregiver's Contact Information: see above Ability/Limitations of Caregiver: family in and out throughout the day Caregiver Availability: 24/7 Discharge Plan Discussed with Primary Caregiver: Yes Is Caregiver In Agreement with Plan?: No Does Caregiver/Family have Issues with Lodging/Transportation while Pt is in Rehab?: No   Goals Patient/Family Goal for Rehab: Mod I to supervision with PT and OT Expected length of stay: ELOS 7 to 10 days Pt/Family Agrees to Admission and willing to participate: Yes Program Orientation Provided & Reviewed with Pt/Caregiver Including Roles  & Responsibilities: Yes    Decrease burden of Care through IP rehab admission: n/a   Possible need for SNF placement upon discharge:not anticipated   Patient Condition: This patient's medical and functional status has changed since the consult dated: min assist overall in which the Rehabilitation Physician determined and documented that the patient's condition is appropriate for intensive rehabilitative care in an inpatient rehabilitation facility. See "History of Present Illness" (above) for medical update. Functional changes are: overall min assist. Patient's medical and functional status update has been discussed with the Rehabilitation physician and patient remains appropriate for inpatient rehabilitation. Will admit to inpatient rehab today.   Preadmission Screen Completed By:  Clois Dupes, RN, 10/13/2020 11:31 AM ______________________________________________________________________   Discussed status with Dr. Riley Kill on 10/13/2020 at  1132 and received approval for admission today.   Admission Coordinator:  Lonia, Roane, time 2951 Date 10/13/2020             Cosigned by: Ranelle Oyster, MD at 10/13/2020 11:41 AM    Revision History                    Note Details  Author Standley Brooking, RN File Time 10/13/2020 11:31 AM  Author Type Rehab Admission Coordinator Status Signed  Last Editor Standley Brooking, RN Service Physical Medicine and Rehabilitation

## 2020-10-13 NOTE — Progress Notes (Signed)
Physical Therapy Treatment Patient Details Name: Rebecca Singh MRN: [id number] DOB: 11/05/1955 Today's Date: 10/13/2020    History of Present Illness 65 yo female with L side weakness admitted for R MCA with revascularization TICI3 on 10/07/20. PMH polysubstance abuse pacemaker L TKA ongoing tobacco abuse, chronic pain, nonischemic cardiomyopathy obses  peripartum CVA with no residual deficits COPD prediabetes    PT Comments    Pt continues to have light sensitivity and was declining to amb in hallway. Pt presenting with L sided weakness, impaired motor planning and sequencing, some L sided inattention, and anxiety re: moving and her deficits. Pt with improved ambulation tolerance however continues to have difficulty sequencing L LE and achieving foot clearance. Continue to recommend CIR. Acute PT to cont to follow.    Follow Up Recommendations  CIR     Equipment Recommendations  3in1 (PT)    Recommendations for Other Services Rehab consult     Precautions / Restrictions Precautions Precautions: Fall Precaution Comments: L inattention Restrictions Weight Bearing Restrictions: No    Mobility  Bed Mobility Overal bed mobility: Needs Assistance Bed Mobility: Supine to Sit     Supine to sit: Min guard Sit to supine: Supervision;HOB elevated   General bed mobility comments: HOB elevated, increased time, min guard for safety  Transfers Overall transfer level: Needs assistance Equipment used: Rolling walker (2 wheeled) Transfers: Sit to/from Stand Sit to Stand: Min assist         General transfer comment: minA to steady during transition of hands, max directional verbal cues for hand placment, increased time  Ambulation/Gait Ambulation/Gait assistance: Min assist Gait Distance (Feet): 30 Feet Assistive device: Rolling walker (2 wheeled);1 person hand held assist Gait Pattern/deviations: Decreased dorsiflexion - left;Decreased weight shift to left;Decreased stance  time - left;Decreased stride length;Decreased step length - right;Decreased step length - left;Step-through pattern;Decreased dorsiflexion - right;Shuffle;Trunk flexed;Wide base of support Gait velocity: dec Gait velocity interpretation: <1.31 ft/sec, indicative of household ambulator General Gait Details: pt with rigid L LE advancement with minimal foot clearance and wide base of support. pt with delayed sequencing, use cuff on RW for L hand to stay on walker. pt with short step length on R due to decreased standing tolerance on L, pt with very rigid, peg leg type gait pattern, 3 standing rest breaks due to pt stating "i just lost my concentration"   Stairs             Wheelchair Mobility    Modified Rankin (Stroke Patients Only) Modified Rankin (Stroke Patients Only) Pre-Morbid Rankin Score: No symptoms Modified Rankin: Moderately severe disability     Balance Overall balance assessment: Needs assistance Sitting-balance support: No upper extremity supported;Feet supported Sitting balance-Leahy Scale: Good     Standing balance support: During functional activity;No upper extremity supported Standing balance-Leahy Scale: Poor Standing balance comment: dependent on bilat UEs                            Cognition Arousal/Alertness: Awake/alert Behavior During Therapy: Anxious Overall Cognitive Status: Impaired/Different from baseline Area of Impairment: Awareness;Problem solving                           Awareness: Emergent Problem Solving: Slow processing;Requires verbal cues General Comments: pt with delayed processing, impaired motor planning/sequencing      Exercises General Exercises - Upper Extremity Elbow Flexion: AROM;Left;5 reps;Seated Elbow Extension: AROM;Left;5 reps;Seated Hand Exercises  Forearm Supination: AROM;Left;Seated;5 reps Forearm Pronation: AROM;Left;5 reps;Seated Wrist Flexion: AROM;Left;5 reps;Seated Wrist Extension:  AROM;Left;5 reps;Seated Digit Composite Flexion: AROM;Left;5 reps;Other (comment) (level 1 theraputty) Composite Extension: AROM;Left;5 reps;Other (comment) (level 1 theraputty) Digit Lifts: Left;Seated;AAROM Opposition: Left;Seated;Other (comment);AAROM    General Comments General comments (skin integrity, edema, etc.): vss, pt continues to report light sensitivity      Pertinent Vitals/Pain Pain Assessment: Faces Faces Pain Scale: Hurts a little bit Pain Location: headache Pain Descriptors / Indicators: Headache Pain Intervention(s): Monitored during session    Home Living                      Prior Function            PT Goals (current goals can now be found in the care plan section) Acute Rehab PT Goals Patient Stated Goal: to get better Progress towards PT goals: Progressing toward goals    Frequency    Min 4X/week      PT Plan Current plan remains appropriate    Co-evaluation              AM-PAC PT "6 Clicks" Mobility   Outcome Measure  Help needed turning from your back to your side while in a flat bed without using bedrails?: A Little Help needed moving from lying on your back to sitting on the side of a flat bed without using bedrails?: A Little Help needed moving to and from a bed to a chair (including a wheelchair)?: A Little Help needed standing up from a chair using your arms (e.g., wheelchair or bedside chair)?: A Little Help needed to walk in hospital room?: A Little Help needed climbing 3-5 steps with a railing? : A Lot 6 Click Score: 17    End of Session Equipment Utilized During Treatment: Gait belt Activity Tolerance: Patient tolerated treatment well (unable to amb in hallway due to light sensitivity) Patient left: in chair;with call bell/phone within reach;with chair alarm set Nurse Communication: Mobility status PT Visit Diagnosis: Unsteadiness on feet (R26.81);Other abnormalities of gait and mobility (R26.89);Hemiplegia and  hemiparesis;Muscle weakness (generalized) (M62.81);Difficulty in walking, not elsewhere classified (R26.2);Other symptoms and signs involving the nervous system (R29.898) Hemiplegia - Right/Left: Left Hemiplegia - dominant/non-dominant: Non-dominant     Time: 0349-1791 PT Time Calculation (min) (ACUTE ONLY): 25 min  Charges:  $Gait Training: 23-37 mins                     Lewis Shock, PT, DPT Acute Rehabilitation Services Pager #: [phone] Office #: [phone]    Iona Hansen 10/13/2020, 1:01 PM

## 2020-10-13 NOTE — Discharge Summary (Addendum)
Stroke Discharge Summary  Patient ID: Rebecca Singh   MRN: [id number]      DOB: Aug 23, 1956  Date of Admission: 10/07/2020 Date of Discharge: 10/13/2020  Attending Physician:  Micki Riley, MD, Stroke MD Consultant(s):  Treatment Team:  Sibyl Parr, MD cardiology Patient's PCP:  [facility]  Discharge Diagnoses:   1. Acute ischemic right MCA stroke due to Right M2/MCA posterior division branch occlusion s/p IR mechanical thrombectomy with TICI2c revascularization 2. Myoperdicarditis complicating recent pacemaker insertion 3.  Post stroke headache 4.  Left hemiparesis  Active Problems:   Chest pain   Acute ischemic right MCA stroke (HCC)   Middle cerebral artery embolism, right   SAH (subarachnoid hemorrhage) (HCC)   Tobacco abuse   Chronic pain syndrome   Prediabetes   Nonischemic cardiomyopathy (HCC)   AICD (automatic cardioverter/defibrillator) present   Acute myopericarditis   Medications to be continued on Rehab  Current Facility-Administered Medications:  .   stroke: mapping our early stages of recovery book, , Does not apply, Once, Bhagat, Srishti L, MD .  acetaminophen (TYLENOL) tablet 650 mg, 650 mg, Oral, Q4H PRN, 650 mg at 10/13/20 0806 **OR** acetaminophen (TYLENOL) 160 MG/5ML solution 650 mg, 650 mg, Oral, Q4H PRN, 650 mg at 10/12/20 1849 **OR** acetaminophen (TYLENOL) suppository 650 mg, 650 mg, Rectal, Q4H PRN, Marvel Plan, MD .  aspirin EC tablet 81 mg, 81 mg, Oral, Daily, Dagar, Anjali, MD, 81 mg at 10/13/20 1254 .  butalbital-acetaminophen-caffeine (FIORICET) 50-325-40 MG per tablet 2 tablet, 2 tablet, Oral, Q6H PRN, Beryle Beams, MD, 2 tablet at 10/13/20 0544 .  Chlorhexidine Gluconate Cloth 2 % PADS 6 each, 6 each, Topical, Daily, Bhagat, Srishti L, MD, 6 each at 10/13/20 1000 .  colchicine tablet 0.6 mg, 0.6 mg, Oral, BID, Dagar, Anjali, MD, 0.6 mg at 10/13/20 1102 .  labetalol (NORMODYNE) injection 5-20 mg, 5-20 mg, Intravenous,  Q2H PRN, Marvel Plan, MD .  methylPREDNISolone sodium succinate (SOLU-MEDROL) 250 mg in sodium chloride 0.9 % 50 mL IVPB, 250 mg, Intravenous, Q12H, [place], Kofi, MD, Last Rate: 104 mL/hr at 10/13/20 0818, 250 mg at 10/13/20 0818 .  nicotine (NICODERM CQ - dosed in mg/24 hours) patch 21 mg, 21 mg, Transdermal, Daily, Dagar, Anjali, MD, 21 mg at 10/13/20 1024 .  ondansetron (ZOFRAN-ODT) disintegrating tablet 4 mg, 4 mg, Oral, Q8H PRN, Dagar, Anjali, MD, 4 mg at 10/10/20 1210 .  pantoprazole (PROTONIX) EC tablet 40 mg, 40 mg, Oral, Daily, Dagar, Anjali, MD, 40 mg at 10/13/20 1024 .  phenol (CHLORASEPTIC) mouth spray 1 spray, 1 spray, Mouth/Throat, PRN, Marvel Plan, MD .  senna-docusate (Senokot-S) tablet 1 tablet, 1 tablet, Oral, QHS PRN, Bhagat, Srishti L, MD .  sodium chloride flush (NS) 0.9 % injection 3 mL, 3 mL, Intravenous, Once, Cathren Laine, MD .  topiramate (TOPAMAX) tablet 25 mg, 25 mg, Oral, BID, Dagar, Anjali, MD, 25 mg at 10/13/20 1254   LABORATORY STUDIES CBC    Component Value Date/Time   WBC 8.2 10/13/2020 0152   RBC 5.06 10/13/2020 0152   HGB 14.7 10/13/2020 0152   HGB 14.8 12/01/2012 2102   HCT 44.7 10/13/2020 0152   HCT 44.2 12/01/2012 2102   PLT 274 10/13/2020 0152   PLT 281 12/01/2012 2102   MCV 88.3 10/13/2020 0152   MCV 89 12/01/2012 2102   MCH 29.1 10/13/2020 0152   MCHC 32.9 10/13/2020 0152   RDW 14.7 10/13/2020 0152   RDW 14.0 12/01/2012 2102  LYMPHSABS 1.1 10/08/2020 0443   MONOABS 0.4 10/08/2020 0443   EOSABS 0.0 10/08/2020 0443   BASOSABS 0.0 10/08/2020 0443   CMP    Component Value Date/Time   NA 138 10/13/2020 0152   NA 138 12/01/2012 2102   K 4.2 10/13/2020 0152   K 4.5 12/01/2012 2102   CL 106 10/13/2020 0152   CL 106 12/01/2012 2102   CO2 21 (L) 10/13/2020 0152   CO2 22 12/01/2012 2102   GLUCOSE 150 (H) 10/13/2020 0152   GLUCOSE 152 (H) 12/01/2012 2102   BUN 20 10/13/2020 0152   BUN 30 (H) 12/01/2012 2102   CREATININE 0.96  10/13/2020 0152   CREATININE 1.24 12/01/2012 2102   CALCIUM 9.5 10/13/2020 0152   CALCIUM 8.8 12/01/2012 2102   PROT 6.9 10/07/2020 0953   PROT 7.6 12/01/2012 2102   ALBUMIN 3.8 10/07/2020 0953   ALBUMIN 3.6 12/01/2012 2102   AST 21 10/07/2020 0953   AST 40 (H) 12/01/2012 2102   ALT 17 10/07/2020 0953   ALT 44 12/01/2012 2102   ALKPHOS 62 10/07/2020 0953   ALKPHOS 86 12/01/2012 2102   BILITOT 0.7 10/07/2020 0953   BILITOT 0.3 12/01/2012 2102   GFRNONAA >60 10/13/2020 0152   GFRNONAA 48 (L) 12/01/2012 2102   GFRAA >60 11/17/2017 0435   GFRAA 56 (L) 12/01/2012 2102   COAGS Lab Results  Component Value Date   INR 1.0 10/07/2020   Lipid Panel    Component Value Date/Time   CHOL 124 10/08/2020 0443   TRIG 52 10/08/2020 0443   HDL 49 10/08/2020 0443   CHOLHDL 2.5 10/08/2020 0443   VLDL 10 10/08/2020 0443   LDLCALC 65 10/08/2020 0443   HgbA1C  Lab Results  Component Value Date   HGBA1C 6.1 (H) 10/08/2020   Urinalysis    Component Value Date/Time   COLORURINE YELLOW 10/07/2020 1719   APPEARANCEUR CLEAR 10/07/2020 1719   LABSPEC >1.046 (H) 10/07/2020 1719   PHURINE 5.0 10/07/2020 1719   GLUCOSEU NEGATIVE 10/07/2020 1719   HGBUR SMALL (A) 10/07/2020 1719   BILIRUBINUR NEGATIVE 10/07/2020 1719   KETONESUR NEGATIVE 10/07/2020 1719   PROTEINUR NEGATIVE 10/07/2020 1719   NITRITE NEGATIVE 10/07/2020 1719   LEUKOCYTESUR NEGATIVE 10/07/2020 1719   Urine Drug Screen     Component Value Date/Time   LABOPIA NONE DETECTED 10/07/2020 1719   COCAINSCRNUR NONE DETECTED 10/07/2020 1719   LABBENZ NONE DETECTED 10/07/2020 1719   AMPHETMU NONE DETECTED 10/07/2020 1719   THCU NONE DETECTED 10/07/2020 1719   LABBARB NONE DETECTED 10/07/2020 1719    Alcohol Level No alcohol use  SIGNIFICANT DIAGNOSTIC STUDIES CT Head wo contrast 10/13/2020  IMPRESSION: 1. Continued interval evolution of patchy right MCA distribution infarcts, stable in size and distribution as compared to  previous exam. No evidence for hemorrhagic transformation or significant regional mass effect. 2. Persistent but decreased hyperdensity within the right Sylvian fissure, likely a small amount of subarachnoid hemorrhage. 3. No other new acute intracranial abnormality.  Ct Head wo contrast 10/10/2019 IMPRESSION: Subacute right frontoparietal and right temporal infarcts. Hyperdense material within the right sylvian fissure is unchanged.  CT Head Code stroke wo contrast 10/07/2020 IMPRESSION: 1. Hypodensity right frontal parietal lobe suspicious for acute infarct given the symptoms. In addition, there is a probable small hyperdense vessel in the right sylvian fissure involving the right middle cerebral artery. 2. ASPECTS is 9  CT Angio Head Neck w or wo contrast 10/07/2020 IMPRESSION: 1. Right M2/MCA posterior division branch occlusion with  moderate distal branch opacification via leptomeningeal collaterals. 2. No hemodynamically significant stenosis in the neck.  Echocardiogram 10/09/2020 IMPRESSIONS  1. Left ventricular ejection fraction, by estimation, is 30 to 35%. The  left ventricle has moderately decreased function. The left ventricle  demonstrates global hypokinesis. The left ventricular internal cavity size  was mildly dilated. There is mild  left ventricular hypertrophy. Left ventricular diastolic parameters are  consistent with Grade I diastolic dysfunction (impaired relaxation).  2. Right ventricular systolic function is mildly reduced. The right  ventricular size is normal. Tricuspid regurgitation signal is inadequate  for assessing PA pressure.  3. Left atrial size was mildly dilated.  4. The mitral valve is normal in structure. No evidence of mitral valve  regurgitation. No evidence of mitral stenosis.  5. The aortic valve is tricuspid. Aortic valve regurgitation is mild. No  aortic stenosis is present.  6. The inferior vena cava is normal in size with  greater than 50%  respiratory variability, suggesting right atrial pressure of 3 mmHg.           HISTORY OF PRESENT ILLNESS HPI: Rebecca Singh is a 65 y.o. 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  She has been in her usual state of health recently.  She had a chamber replacement for her pacemaker on 09/25/2020 which was uncomplicated.  She also reports she had a recent left knee replacement about 3 months ago (though on chart review this was scheduled for 05/07/2020).  She was at work this morning when she suddenly started to feel unwell, with a severe headache.  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  She had some left-sided weakness, left facial droop, was drooling on the left side.  EMS was activated and noted her blood pressures in the 190s, blood glucose in the 100s, and a code stroke was activated.  EMS reports he is generally noncompliant with any medications and is not taking any medications despite her significant cardiac history, and she generally only goes to her cardiology appointments but not other doctors appointments typically. Upon arrival to the ED, decision to tPA was complicated by the fact that she had had this recent procedure.  Cardiology confirmed that there would be some hemorrhage risk in the pocket but that this could be managed adequately with close monitoring.  However head CT revealed a potential acute infarct in the right parietal region, in a region that is often asymptomatic and therefore is difficult to date.  However she also had a hyperdense vessel sign in the right MCA, with CT angio confirming an acute right M2 occlusion. Her son could not be  reached for consent, and therefore her sister was consented for intra-arterial thrombectomy. LKW: 8:30 AM tPA given?: No, due to acute infarct on head CT IA performed?:  Yes Premorbid modified rankin scale:      0 - No symptoms.  HOSPITAL COURSE  Rebecca Singh is a 65 y.o. 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  Head CT revealed a potential acute infarct in right parietal region.  CTA confirmed an acute right M2 occlusion.  No tPA given due to recent AICD placement.  MRI is contraindicated due to recent pacemaker procedure, per MRI technician need a minimum of 6 weeks from procedure.  Patient is s/p thrombectomy.  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  Code Stroke CT head:  Hypodensity right frontal parietal lobe suspicious for acute infarct. In addition, hyperdense sign at right sylvian fissure MCA  CTA head & neck : Right M2/MCA posterior division branch occlusion with moderate distal branch opacification via leptomeningeal collaterals.   MRI: Contraindicated due to recent pacemaker switch, per MRI technician need a minimum of 6 weeks from procedure. (09/25/2020)  IR - R M2 inferior division TICI 2c revasc  CT head post op 1/25 - unchanged  CT repeat 10/09/2020: Subacute right frontoparietal and right temporal infarct.  SAH within the right sylvian fissure is unchanged  CT repeat 10/13/2020: Right MCA distribution infarct stable in size.  No evidence for hemorrhagic transformation.  Decrease hypodensity within the right sylvian fissure.  2D Echo -  EF 30 to 35%.    ICD interrogation no afib - new ICD from 09/25/20  LDL 65  HgbA1c: 6.1  VTE prophylaxis -SCDs  aspirin 81 mg daily prior to admission, now on aspirin 81 mg daily, started on 10/13/2020 after repeat CT scan  Topamax 25 mg twice daily for headache  Therapy recommendations: CIR   Disposition: Today to CIR  Nonischemic cardiomyopathy CHF  EF 30 to 35% in 2013  S/p ICD exchange, interrogation no afib   EF improved to 65 to 70% in 2016  Current TTE EF 30 to 35%.                          Recent ICD exchange on 09/25/20 at [facility] dual chamber pacemaker   Cardiology consult called ? - pt would like to follow up with [facility] cardiology now - consult pending ?  Pt was seen here 10/07/20 by Cardiologist Jodelle Red, MD, PhD, [facility]  Hypertension  Home meds: Metoprolol 25 mg  Now on labetalol 5 mg PRN, now off Cleviprex  BP stable  Long-term BP goal normotensive   Hyperlipidemia   No statin at home  LDL 65, goal < 70  Currently LDL at goal, no statin initiated.   Tobacco use disorder  Home meds: Nicotine 21 mg patch daily  Start nicotine 21 mg patch daily  Smoking cessation provided  Pt is willing to quit  Other Stroke Risk Factors  Advanced Age >/= 97   Obesity, Body mass index is 34.87 kg/m., BMI >/= 30 associated with increased stroke risk, recommend weight loss, diet and exercise as appropriate   Hx stroke/TIA: Postpartum stroke in the past with no residual affects  Myopericarditis  Elevated troponins (17->20->22)  T wave inversions throughout her precordial leads   CRP was elevated (4.0)  Repeat ECG with programmer which showed resolution of precordial T wave inversions which is trigeminy  Meds: Methyl prednisolone, colchicine 0.6 mg twice daily.  Continue colchicine for 90 days if no side effect to medication.  If abdominal complaints or diarrhea decreased 0.6mg  p.o. daily  DISCHARGE EXAM Blood pressure  127/79, pulse (!) 53, temperature (!) 97.4 F (36.3 C), temperature source Oral, resp.  rate 18, height 5\' 6"  (1.676 m), weight 98 kg, SpO2 95 %. General: Elderly female lying comfortably in bed, NAD HEENT: [place]/AT, MMM, PERRLA;  She appears have some scalp tenderness on the right which is concerning. Cardiovascular: Regular rate and rhythm Respiratory: No respiratory distress Gastrointestinal: Soft, no rigidity no, no tenderness Extremities: No peripheral edema Psych: Normal mood and affect Neurological:  Mental Status: Alert, oriented, thought content appropriate.  Speech fluent without evidence of aphasia. Able to follow 3 step commands without difficulty. Cranial Nerves: II:  Visual fields grossly normal, pupils equal, round, reactive to light and accommodation III,IV, VI: ptosis not present, extra-ocular motions intact bilaterally V,VII: smile symmetric, facial light touch sensation decreased on left VIII: hearing normal bilaterally IX,X: uvula rises symmetrically XI: bilateral shoulder shrug XII: midline tongue extension without atrophy or fasciculations  Motor: Right :  Upper extremity   5/5                                      Left:     Upper extremity   3/5             Lower extremity   5/5                                                  Lower extremity   4/5 Tone and bulk:normal tone throughout; no atrophy noted Sensory: Pinprick and light touch is decreased on left side. Deep Tendon Reflexes:  Right: Upper Extremity                       Left: Upper extremity   biceps (C-5 to C-6) 2/4                       biceps (C-5 to C-6) 2/4 tricep (C7) 2/4                                     triceps (C7) 2/4 Brachioradialis (C6) 2/4                      Brachioradialis (C6) 2/4  Lower Extremity Lower Extremity  quadriceps (L-2 to L-4) 2/4                 quadriceps (L-2 to L-4) 2/4 Achilles (S1) 2/4                                  Achilles (S1) 2/4  Plantars: Right:  downgoing                                Left: downgoing Cerebellar: Normal finger-to-nose on left, unable to do on right Gait: Deferred   Discharge Diet      Diet   Diet Heart Room service appropriate? Yes with Assist; Fluid consistency: Thin   liquids  DISCHARGE PLAN  Disposition:  Transfer to [facility] for ongoing PT, OT and ST  aspirin 81 mg daily for secondary stroke prevention.  Recommend ongoing  stroke risk factor control by Primary Care Physician at time of discharge from inpatient rehabilitation.  Follow-up PCP [facility] in 2 weeks following discharge from rehab.  Follow-up in [facility] in 6 weeks following discharge from rehab, office to schedule an appointment.   35 minutes were spent preparing discharge.  I have personally obtained history,examined this patient, reviewed notes, independently viewed imaging studies, participated in medical decision making and plan of care.ROS completed by me personally and pertinent positives fully documented  I have made any additions or clarifications directly to the above note. Agree with note above.    Delia HeadyPramod Shadiamond Koska, MD Medical Director [facility] Pager: [phone] 10/13/2020 3:38 PM

## 2020-10-13 NOTE — Progress Notes (Signed)
Patient arrived on unit, oriented to unit. Reviewed medications, therapy schedule, rehab routine and plan of care. States an understanding of information reviewed. No complications noted at this time. Patient reports no pain and is AX4 Rebecca Singh L [name]  

## 2020-10-13 NOTE — IPOC Note (Signed)
Individualized overall Plan of Care [facility]) Patient Details Name: Rebecca Singh MRN: [id number] DOB: Jun 05, 1956  Admitting Diagnosis: Right middle cerebral artery stroke [facility])  Hospital Problems: Principal Problem:   Right middle cerebral artery stroke (HCC) Active Problems:   Leukocytosis   Urge incontinence   Chronic systolic congestive heart failure (HCC)   Vascular headache   Anxiety state     Functional Problem List: Nursing Endurance,Medication Management,Motor,Pain,Safety,Perception  PT Balance,Endurance,Motor,Pain,Sensory  OT Balance,Endurance,Motor,Pain,Perception,Safety,Sensory,Skin Integrity,Vision  SLP    TR         Basic ADL's: OT Grooming,Bathing,Dressing,Toileting     Advanced  ADL's: OT Simple Meal Preparation,Light Housekeeping     Transfers: PT Bed Mobility,Bed to [employer]  OT Toilet,Tub/Shower     Locomotion: PT Ambulation,Wheelchair Mobility,Stairs     Additional Impairments: OT Fuctional Use of Upper Extremity  SLP        TR      Anticipated Outcomes Item Anticipated Outcome  Self Feeding    Swallowing      Basic self-care  Mod I  Toileting  Mod I   Bathroom Transfers Mod I  Bowel/Bladder  Remain continent of bowel/bladder while in rehab  Transfers  Mod I  Locomotion  supervision with LRAD  Communication     Cognition     Pain  no pain  Safety/Judgment  Able to call for help and express needs   Therapy Plan: PT Intensity: Minimum of 1-2 x/day ,45 to 90 minutes PT Frequency: 5 out of 7 days PT Duration Estimated Length of Stay: 7-10 days OT Intensity: Minimum of 1-2 x/day, 45 to 90 minutes OT Frequency: 5 out of 7 days OT Duration/Estimated Length of Stay: 7-10 days      Team Interventions: Nursing Interventions Patient/Family Education,Pain Management,Discharge Planning,Medication Management,Skin Care/Wound Management,Cognitive Remediation/Compensation,Disease Management/Prevention  PT interventions  Ambulation/gait training,Discharge planning,Functional mobility training,Psychosocial support,Therapeutic Activities,Balance/vestibular training,Disease management/prevention,Neuromuscular re-education,Skin care/wound Environmental consultant propulsion/positioning,Cognitive remediation/compensation,DME/adaptive equipment instruction,Pain management,Splinting/orthotics,UE/LE Strength taining/ROM,Community reintegration,Functional electrical stimulation,Patient/family education,Stair training,UE/LE Coordination activities  OT Interventions Balance/vestibular training,Community reintegration,Discharge planning,Disease Psychologist, prison and probation services stimulation,Functional mobility training,Neuromuscular re-education,Pain management,Patient/family education,Psychosocial support,Self Care/advanced ADL retraining,Skin care/wound managment,Splinting/orthotics,Therapeutic Activities,Therapeutic Exercise,UE/LE Strength taining/ROM,UE/LE Coordination activities,Visual/perceptual remediation/compensation  SLP Interventions    TR Interventions    SW/CM Interventions Discharge Planning,Psychosocial Support,Patient/Family Education   Barriers to Discharge MD  Medical stability and Behavior  Nursing      PT Inaccessible home environment,Home environment access/layout 7 STE with 2 handrails  OT Home environment access/layout 7 steps to enter  SLP      SW       Team Discharge Planning: Destination: PT-Home ,OT- Home , SLP-  Projected Follow-up: PT-Outpatient PT, OT-  Outpatient OT, SLP-  Projected Equipment Needs: PT-To be determined, OT- Tub/shower seat, SLP-  Equipment Details: PT-pt has RW, OT-  Patient/family involved in discharge planning: PT- Patient,  OT-Patient, SLP-   MD ELOS: 8-12 days. Medical Rehab Prognosis:  Good Assessment: 65 year old right-handed female with history of COPD/tobacco abuse, polysubstance use in the past,  chronic pain, 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 She presented 10/07/2020 with acute onset of left-sided weakness. Noted blood pressure in the 190s. Admission chemistries unremarkable except glucose 109 creatinine 1.03, SARS coronavirus negative, urine drug screen negative. CT of the head showed hypodensity right frontal parietal lobe suspicious for acute infarction in addition there was a probable small hyperdense vessel in the right sylvian fissure involving the  right MCA. CT angiogram of head and neck right M2/MCA posterior division branch occlusion with moderate distal branch opacification. No hemodynamically significant stenosis in the neck. Patient underwent revascularization per interventional radiology. Follow-up CT of the head showed small volume hyperdensity within the right sylvian fissure reflecting extravasated contrast and/or subarachnoid hemorrhage. Possible subtle acute infarct changes in the right frontal parietal lobe. Echocardiogram with ejection fraction of 30 to 35% grade 1 diastolic dysfunction.  Follow-up cranial CT scan 10/13/2020 showed continued interval evolution of patchy right MCA distribution infarct stable in size and distribution as compared to previous exam.  No evidence of hemorrhagic transformation or significant mass-effect.  Persistent but decreased hyperdensity within the right sylvian fissure likely a small amount of subarachnoid hemorrhage.  Patient was cleared to begin low-dose aspirin for CVA prophylaxis.  Cardiology services consulted for both ICD interrogation for A. fib as well as nonspecific chest discomfort. EKG completed similar to distant prior EKGs.biV rhythm 70s.. EKG consistent with myopericarditis for which she takes high-dose nonsteroidals and colchicine in the past and currently completing a course of  Solu-Medrol. Troponin was completed 24-17-20-22. Chest discomfort felt to be more pleuritic in nature. Cardiology did sign off no further work-up currently indicated. She is tolerating a regular consistency diet.  Patient with resulting functional deficits with mobility, transfers, self-care, balance. Will set goals for Mod I with PT/OT.    Due to the current state of emergency, patients may not be receiving their 3-hours of Medicare-mandated therapy.  See Team Conference Notes for weekly updates to the plan of care

## 2020-10-13 NOTE — Progress Notes (Signed)
Inpatient Rehabilitation Medication Review by a Pharmacist  A complete drug regimen review was completed for this patient to identify any potential clinically significant medication issues.  Clinically significant medication issues were identified:  no  Check AMION for pharmacist assigned to patient if future medication questions/issues arise during this admission.  Pharmacist comments:   Solumedrol 250 mg IV q12h x 4 doses begun 1/30 pm on 3W and 2nd dose given this am.  2 more doses scheduled on Rehab.  SCDs for VTE prophylaxis  Time spent performing this drug regimen review (minutes):  10   Dennie Fetters, [place] 10/13/2020 6:53 PM

## 2020-10-13 NOTE — Progress Notes (Addendum)
STROKE TEAM PROGRESS NOTE   INTERVAL HISTORY Patient evaluated at bedside this morning, no family present in the room.  Patient still complains of headache, she is informed that she will be started on Topamax 25 mg twice daily.  Patient states her abdominal pain is better but she takes her PPI at home on empty stomach and would like to take it in similar fashion.  Patient is informed about myopericarditis and told that she is taking high-dose methylprednisone and also started on colchicine twice daily to help with the chest pain and she shows good understanding about it.  Her repeat CT scan this morning does not show any evidence for hemorrhagic transformation and MCA distribution infarct is stable in size.  Also, decrease hyperdensity within the right sylvian fissure and that is why patient is safely started on coated aspirin 81 mg this morning as well.  Patient is informed about availability of bed in CIR today and she states she is happy to go for rehabilitation.  Blood pressure well controlled.  Neurological exam is improving.  Vitals:   10/12/20 2336 10/13/20 0540 10/13/20 0758 10/13/20 1135  BP: 117/75 127/66 114/84 127/79  Pulse: (!) 54 (!) 54 (!) 58 (!) 53  Resp: 20 16 18 18   Temp:  97.7 F (36.5 C) 97.7 F (36.5 C) (!) 97.4 F (36.3 C)  TempSrc: Oral Oral Oral Oral  SpO2: 97% 95% 94% 95%  Weight:      Height:       CBC:  Recent Labs  Lab 10/07/20 0953 10/07/20 0959 10/08/20 0443 10/12/20 0022 10/13/20 0152  WBC 6.3  --  8.5 8.7 8.2  NEUTROABS 3.7  --  7.0  --   --   HGB 14.6   < > 12.6 14.3 14.7  HCT 46.2*   < > 39.6 43.7 44.7  MCV 91.1  --  90.0 88.1 88.3  PLT 288  --  265 267 274   < > = values in this interval not displayed.   Basic Metabolic Panel:  Recent Labs  Lab 10/12/20 0022 10/13/20 0152  NA 136 138  K 4.0 4.2  CL 103 106  CO2 22 21*  GLUCOSE 107* 150*  BUN 24* 20  CREATININE 0.96 0.96  CALCIUM 9.5 9.5   Lipid Panel:  Recent Labs  Lab  10/08/20 0443  CHOL 124  TRIG 52  HDL 49  CHOLHDL 2.5  VLDL 10  LDLCALC 65   HgbA1c:  Recent Labs  Lab 10/08/20 0443  HGBA1C 6.1*   Urine Drug Screen:  Recent Labs  Lab 10/07/20 1719  LABOPIA NONE DETECTED  COCAINSCRNUR NONE DETECTED  LABBENZ NONE DETECTED  AMPHETMU NONE DETECTED  THCU NONE DETECTED  LABBARB NONE DETECTED     IMAGING   CT Head wo contrast 10/13/2020  IMPRESSION: 1. Continued interval evolution of patchy right MCA distribution infarcts, stable in size and distribution as compared to previous exam. No evidence for hemorrhagic transformation or significant regional mass effect. 2. Persistent but decreased hyperdensity within the right Sylvian fissure, likely a small amount of subarachnoid hemorrhage. 3. No other new acute intracranial abnormality.  Ct Head wo contrast 10/10/2019 IMPRESSION: Subacute right frontoparietal and right temporal infarcts. Hyperdense material within the right sylvian fissure is unchanged.  CT Head Code stroke wo contrast 10/07/2020 IMPRESSION: 1. Hypodensity right frontal parietal lobe suspicious for acute infarct given the symptoms. In addition, there is a probable small hyperdense vessel in the right sylvian fissure involving the right middle  cerebral artery. 2. ASPECTS is 9  CT Angio Head Neck w or wo contrast 10/07/2020 IMPRESSION: 1. Right M2/MCA posterior division branch occlusion with moderate distal branch opacification via leptomeningeal collaterals. 2. No hemodynamically significant stenosis in the neck.  Echocardiogram 10/09/2020 IMPRESSIONS  1. Left ventricular ejection fraction, by estimation, is 30 to 35%. The  left ventricle has moderately decreased function. The left ventricle  demonstrates global hypokinesis. The left ventricular internal cavity size  was mildly dilated. There is mild  left ventricular hypertrophy. Left ventricular diastolic parameters are  consistent with Grade I diastolic  dysfunction (impaired relaxation).  2. Right ventricular systolic function is mildly reduced. The right  ventricular size is normal. Tricuspid regurgitation signal is inadequate  for assessing PA pressure.  3. Left atrial size was mildly dilated.  4. The mitral valve is normal in structure. No evidence of mitral valve  regurgitation. No evidence of mitral stenosis.  5. The aortic valve is tricuspid. Aortic valve regurgitation is mild. No  aortic stenosis is present.  6. The inferior vena cava is normal in size with greater than 50%  respiratory variability, suggesting right atrial pressure of 3 mmHg.   PHYSICAL EXAM  General: Elderly female lying comfortably in bed, NAD HEENT: [place]/AT, MMM, PERRLA;  She appears have some scalp tenderness on the right which is concerning. Cardiovascular: Regular rate and rhythm Respiratory: No respiratory distress Gastrointestinal: Soft, no rigidity no, no tenderness Extremities: No peripheral edema Psych: Normal mood and affect Neurological:  Mental Status: Alert, oriented, thought content appropriate.  Speech fluent without evidence of aphasia. Able to follow 3 step commands without difficulty. Cranial Nerves: II:  Visual fields grossly normal, pupils equal, round, reactive to light and accommodation III,IV, VI: ptosis not present, extra-ocular motions intact bilaterally V,VII: smile symmetric, facial light touch sensation decreased on left VIII: hearing normal bilaterally IX,X: uvula rises symmetrically XI: bilateral shoulder shrug XII: midline tongue extension without atrophy or fasciculations  Motor: Right : Upper extremity   5/5    Left:     Upper extremity   3/5  Lower extremity   5/5     Lower extremity   4/5 Tone and bulk:normal tone throughout; no atrophy noted Sensory: Pinprick and light touch is decreased on left side. Deep Tendon Reflexes:  Right: Upper Extremity   Left: Upper extremity   biceps (C-5 to C-6) 2/4   biceps (C-5  to C-6) 2/4 tricep (C7) 2/4    triceps (C7) 2/4 Brachioradialis (C6) 2/4  Brachioradialis (C6) 2/4  Lower Extremity Lower Extremity  quadriceps (L-2 to L-4) 2/4   quadriceps (L-2 to L-4) 2/4 Achilles (S1) 2/4   Achilles (S1) 2/4  Plantars: Right: downgoing   Left: downgoing Cerebellar: Normal finger-to-nose on left, unable to do on right Gait: Deferred     ASSESSMENT/PLAN Ms. BROGAN ENGLAND is a 65 y.o. 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  Head CT revealed a potential acute infarct in right parietal region.  CTA confirmed an acute right M2 occlusion.  No tPA given due to recent AICD placement.  MRI is contraindicated due to recent pacemaker procedure, per MRI technician need a minimum of 6 weeks from procedure.  Patient is s/p thrombectomy.  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  Code Stroke CT head:  Hypodensity right frontal parietal lobe suspicious for acute infarct. In addition, hyperdense sign at right sylvian fissure MCA  CTA head & neck : Right M2/MCA posterior division branch occlusion with moderate distal branch opacification via leptomeningeal collaterals.   MRI: Contraindicated due to recent pacemaker switch, per MRI technician need a minimum of 6 weeks from procedure. (09/25/2020)  IR - R M2 inferior division TICI 2c revasc  CT head post op 1/25 - unchanged  CT repeat 10/09/2020: Subacute right frontoparietal and right temporal infarct.  SAH within the right sylvian fissure is unchanged  CT repeat 10/13/2020: Right MCA distribution infarct stable in  size.  No evidence for hemorrhagic transformation.  Decrease hypodensity within the right sylvian fissure.  2D Echo -  EF 30 to 35%.   ICD interrogation no afib - new ICD from 09/25/20  LDL 65  HgbA1c: 6.1  VTE prophylaxis -SCDs  aspirin 81 mg daily prior to admission, now on aspirin 81 mg daily, started on 10/13/2020 after repeat CT scan  Topamax 25 mg twice daily for headache  Therapy recommendations: CIR   Disposition: Today to CIR  Nonischemic cardiomyopathy CHF  EF 30 to 35% in 2013  S/p ICD exchange, interrogation no afib   EF improved to 65 to 70% in 2016  Current TTE EF 30 to 35%.                          Recent ICD exchange on 09/25/20 at [facility] [employer] dual chamber pacemaker   Cardiology consult called ? - pt would like to follow up with [facility] cardiology now - consult pending ?  Pt was seen here 10/07/20 by Cardiologist Jodelle Red, MD, PhD, [facility]  Hypertension  Home meds: Metoprolol 25 mg  Now on labetalol 5 mg PRN, now off Cleviprex . BP stable . Long-term BP goal normotensive   Hyperlipidemia   No statin at home  LDL 65, goal < 70  Currently LDL at goal, no statin initiated.   Tobacco use disorder  Home meds: Nicotine 21 mg patch daily  Start nicotine 21 mg patch daily  Smoking cessation provided  Pt is willing to quit  Other Stroke Risk Factors  Advanced Age >/= 63   Obesity, Body mass index is 34.87 kg/m., BMI >/= 30 associated with increased stroke risk, recommend weight loss, diet and exercise as appropriate   Hx stroke/TIA: Postpartum stroke in the past with no residual affects  Myopericarditis  Elevated troponins (17->20->22)  T wave inversions throughout her precordial leads   CRP was elevated (4.0)  Repeat ECG with programmer which showed resolution of precordial T wave inversions which is trigeminy  Meds: Methyl prednisolone, colchicine 0.6 mg twice daily.  Continue colchicine for 90 days if no  side effect to medication.  If abdominal complaints or diarrhea decreased 0.6mg  p.o. daily    Hospital day # 6  I reviewed above note and agree with the assessment and plan. Pt was seen and examined.  She reports left upper extremity still being the same.  Left upper extremity is graded as 3/5 both proximally and distally left lower extremity 4/5.  No facial asymmetry.  Patient is complaining of headaches and will start Topamax for that.  Otherwise the chest pain seems to have improved after starting colchicine and steroids for  myopericarditis.  Recommend transfer to inpatient rehab if bed available later today.  Discussed with patient and rehab coordinator. Delia Heady, MD       To contact Stroke Continuity provider, please refer to [email or web address]. After hours, contact General Neurology

## 2020-10-13 NOTE — H&P (Signed)
Physical Medicine and Rehabilitation Admission H&P        Chief Complaint  Patient presents with  . 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 Per chart review patient lives with son and grandchildren sister and elderly father. Independent with assistive device. Mobile home 3 steps to entry. She presented 10/07/2020 with acute onset of left-sided weakness. Noted blood pressure in the 190s. Admission chemistries unremarkable except glucose 109 creatinine 1.03, SARS coronavirus negative, troponin XX 4 urine drug screen negative. CT of the head showed hypodensity right frontal parietal lobe suspicious for acute infarction in addition there was a probable small hyperdense vessel in the right sylvian fissure involving the right MCA. CT angiogram of head and neck right M2/MCA posterior division branch occlusion with moderate distal branch opacification. No hemodynamically significant stenosis in the neck. Patient underwent revascularization per interventional radiology. Follow-up CT of the head showed small volume hyperdensity within the right sylvian fissure reflecting extravasated contrast and/or subarachnoid hemorrhage. Possible subtle acute infarct changes in the right frontal parietal lobe. Echocardiogram with ejection fraction of 30 to 35% grade 1 diastolic dysfunction.  Follow-up cranial CT scan 10/13/2020 showed continued interval evolution of patchy right MCA distribution infarct stable in size and distribution as compared to previous exam.  No evidence of hemorrhagic transformation or significant mass-effect.  Persistent but decreased hyperdensity within the right sylvian fissure likely a small  amount of subarachnoid hemorrhage.  Patient was cleared to begin low-dose aspirin for CVA prophylaxis.  Cardiology services consulted for both ICD interrogation for A. fib as well as nonspecific chest discomfort. EKG completed similar to distant prior EKGs.biV rhythm 70s.. EKG consistent with myopericarditis for which she takes high-dose nonsteroidals and colchicine in the past and currently completing a course of Solu-Medrol. Troponin was completed 24-17-20-22. Chest discomfort felt to be more pleuritic in nature. Cardiology did sign off no further work-up currently indicated. She is tolerating a regular consistency diet. Due to patient's left side weakness and decrease in functional ability physical medicine rehab consult requested and patient was admitted for a comprehensive rehab program.   Review of Systems  Constitutional: Positive for malaise/fatigue. Negative for fever.  HENT: Negative for hearing loss.   Eyes: Negative for blurred vision and double vision.  Respiratory: Positive for shortness of breath.   Cardiovascular: Positive for chest pain and leg swelling.  Gastrointestinal: Positive for constipation. Negative for heartburn, nausea and vomiting.  Genitourinary: Negative for dysuria, flank pain and hematuria.  Musculoskeletal: Positive for myalgias.  Skin: Negative for rash.  Neurological: Positive for sensory change, focal weakness, weakness and headaches.  All other systems reviewed and are negative.   Past Medical History:  Diagnosis Date  . AICD (automatic cardioverter/defibrillator) present    . COPD (chronic obstructive pulmonary disease) (HCC)    . Hypothyroidism    . Myocardial infarction (HCC)    . Pacemaker/defibrillator    . Stroke [facility])           Past Surgical History:  Procedure Laterality Date  . ABDOMINAL HYSTERECTOMY      . CHOLECYSTECTOMY      . INSERT / REPLACE / REMOVE PACEMAKER      . IR PERCUTANEOUS ART THROMBECTOMY/INFUSION INTRACRANIAL INC DIAG ANGIO  10/07/2020  . RADIOLOGY WITH ANESTHESIA N/A 10/07/2020    Procedure: IR WITH ANESTHESIA - CODE STROKE;  Surgeon: Radiologist, Medication, MD;  Location: [facility] OR;  Service: Radiology;  Laterality: N/A;  . THYROIDECTOMY      . TONSILLECTOMY        History reviewed. No pertinent family history. Social History:  reports that she has been smoking cigarettes. She has a 15.00 pack-year smoking history. She has never used smokeless tobacco. She reports that she does not drink alcohol and does not use drugs. Allergies:       Allergies  Allergen Reactions  . Codeine Shortness Of Breath  . Aspirin Other (See Comments)      "burns" the patient's stomach  . Latex Other (See Comments)      Makes the "skin peel"    . Other Nausea Only and Other (See Comments)      Spicy foods- "Upset the patient's stomach"          Medications Prior to Admission  Medication Sig Dispense Refill  . ibuprofen (ADVIL) 200 MG tablet Take 200-400 mg by mouth every 6 (six) hours as needed (for knee and back pain).      Marland Kitchen NEXIUM 24HR 20 MG capsule Take 20 mg by mouth daily before breakfast.      . aspirin EC 81 MG EC tablet Take 1 tablet (81 mg total) by mouth daily. (Patient not taking: No sig reported)      . cyclobenzaprine (FLEXERIL) 10 MG tablet Take 1 tablet (10 mg total) by mouth 3 (three) times daily as needed. (Patient not taking: No sig reported) 30 tablet 0  . metoprolol tartrate (LOPRESSOR) 25 MG tablet Take 1 tablet (25 mg total) by mouth 2 (two) times daily. (Patient not taking: No sig reported) 30 tablet 0  . nicotine (NICODERM CQ - DOSED IN MG/24 HOURS) 21 mg/24hr patch Place 1 patch (21 mg total) onto the skin daily. (Patient not taking: No sig reported) 28 patch 0  . ondansetron (ZOFRAN-ODT) 4 MG disintegrating tablet Take 1 tablet (4 mg total) by mouth every 8 (eight) hours as needed. (Patient not taking: Reported on 10/07/2020) 20 tablet 0  . oxyCODONE-acetaminophen (PERCOCET/ROXICET) 5-325 MG tablet Take  1-2 tablets by mouth every 6 (six) hours as needed for severe pain. 30 tablet 0  . predniSONE (STERAPRED UNI-PAK 21 TAB) 10 MG (21) TBPK tablet Take 6 pills on day one then decrease by 1 pill each day (Patient not taking: Reported on 10/07/2020) 21 tablet 0      Drug Regimen Review Drug regimen was reviewed and remains appropriate with no significant issues identified   Home: Home Living Family/patient expects to be discharged to:: Private residence Living Arrangements:  (Father, sister, grandchildren) Available Help at Discharge: Available 24 hours/day Type of Home: Mobile home Home Access: Stairs to enter Entrance Stairs-Rails: Right Home Layout: One level Bathroom Shower/Tub: Health visitor: Standard Bathroom Accessibility: Yes Home Equipment: Engineer, production - 2 wheels,Bedside commode Additional Comments: father that is 24 yo lives in the home as well, x 3 boston terriers Psychologist, educational ( triple D- Devil Dog) [name], grandson 48 granddaughter 26 yo oldest son, sister  Lives With: Family   Functional History: Prior Function Level of Independence: Independent with assistive device(s) Comments: 3 months s/p L TKA-   Functional Status:  Mobility: Bed Mobility Overal bed mobility: Needs Assistance Bed Mobility: Supine to Sit Rolling: Supervision Supine to sit: Min guard,HOB elevated Sit to supine: Max assist  General bed mobility comments: Pt able to initiate leg movement off bed and was provided cues to push up on L arm, success with min guard and HOB elevated. Extra time to square hips with EOB. Transfers Overall transfer level: Needs assistance Equipment used: Rolling walker (2 wheeled) Transfers: Sit to/from Stand Sit to Stand: Min assist General transfer comment: Cued pt to push up from current surface 1x from bed and 1x from commode. Assistance and cues to attend to L hand for placement. Tactile cues at L quad to extend knee. Ambulation/Gait Ambulation/Gait  assistance: Min assist Gait Distance (Feet): 20 Feet (x2 bouts) Assistive device: Rolling walker (2 wheeled),1 person hand held assist Gait Pattern/deviations: Decreased dorsiflexion - left,Decreased weight shift to left,Decreased stance time - left,Decreased stride length,Decreased step length - right,Decreased step length - left,Step-through pattern,Decreased dorsiflexion - right,Shuffle,Trunk flexed,Wide base of support General Gait Details: pt had difficulty grasping walker with L hand and pushed through her forearm on the L side. Required repeated cues to attend to L hand and correct positioning or to attend to L side of RW or L foot to avoid obstaceles. Cued pt to increase bil feet clearance due to bil foot drag, min-no success. Gait velocity: reduced Gait velocity interpretation: <1.31 ft/sec, indicative of household ambulator   ADL: ADL Overall ADL's : Needs assistance/impaired Grooming: Moderate assistance,Sitting Upper Body Bathing: Moderate assistance,Bed level Lower Body Bathing: Maximal assistance,Sit to/from stand Toilet Transfer: +2 for physical assistance,Moderate assistance Toilet Transfer Details (indicate cue type and reason): simulated eob to stand simualted BSC General ADL Comments: pt very guarded and fear of falling   Cognition: Cognition Overall Cognitive Status: Impaired/Different from baseline Arousal/Alertness: Awake/alert Orientation Level: Oriented X4 Attention: Focused,Alternating Focused Attention: Impaired Focused Attention Impairment: Verbal complex,Functional complex Alternating Attention: Impaired Alternating Attention Impairment: Verbal complex,Functional complex Memory: Impaired Memory Impairment: Decreased recall of new information,Decreased short term memory Awareness: Appears intact Executive Function: Organizing,Self Monitoring,Sequencing Sequencing: Impaired Sequencing Impairment: Verbal complex,Functional complex Organizing:  Impaired Organizing Impairment: Verbal complex,Functional complex Self Monitoring: Impaired Safety/Judgment: Appears intact Cognition Arousal/Alertness: Awake/alert Behavior During Therapy: WFL for tasks assessed/performed Overall Cognitive Status: Impaired/Different from baseline Area of Impairment: Awareness,Problem solving,Safety/judgement,Memory Memory: Decreased recall of precautions,Decreased short-term memory Safety/Judgement: Decreased awareness of safety,Decreased awareness of deficits Awareness: Emergent Problem Solving: Slow processing,Difficulty sequencing,Requires verbal cues General Comments: Pt required repeated cues to remain attentive to L side, but still leaving or neglecting L side often. Extra time to process cues and acknowledge safety concerns.   Physical Exam: Blood pressure 127/66, pulse (!) 54, temperature 97.7 F (36.5 C), temperature source Oral, resp. rate 16, height 5\' 6"  (1.676 m), weight 98 kg, SpO2 95 %. Physical Exam Constitutional:      General: She is not in acute distress.    Appearance: She is obese.  HENT:     Head: Normocephalic.     Right Ear: External ear normal.     Left Ear: External ear normal.     Nose: Nose normal.     Mouth/Throat:     Mouth: Mucous membranes are moist.     Pharynx: Oropharynx is clear.  Eyes:     Extraocular Movements: Extraocular movements intact.     Pupils: Pupils are equal, round, and reactive to light.  Cardiovascular:     Rate and Rhythm: Normal rate and regular rhythm.     Heart sounds: No murmur heard. No gallop.   Pulmonary:     Effort: Pulmonary effort is normal. No respiratory distress.  Breath sounds: Normal breath sounds. No wheezing or rales.  Abdominal:     General: Bowel sounds are normal. There is no distension.  Musculoskeletal:        General: No swelling or tenderness.     Cervical back: Normal range of motion.  Skin:    General: Skin is warm and dry.  Neurological:     Mental  Status: She is alert.     Comments: Patient is alert in no acute distress. Oriented to person place and time. Follows commands. Fair insight and awareness. Left homonymous hemianopsia. Photosensitive. Blurry visions without diplopia. Speech clear. LUE apraxic but groswly 3-4/5. LLl apraxic 2-3/5. Inconsistent effort. Sensation 1/2 left face, arm, leg.  DTRs 2+.  Psychiatric:        Mood and Affect: Mood normal.        Lab Results Last 48 Hours        Results for orders placed or performed during the hospital encounter of 10/07/20 (from the past 48 hour(s))  CBC     Status: None    Collection Time: 10/12/20 12:22 AM  Result Value Ref Range    WBC 8.7 4.0 - 10.5 K/uL    RBC 4.96 3.87 - 5.11 MIL/uL    Hemoglobin 14.3 12.0 - 15.0 g/dL    HCT 82.9 56.2 - 13.0 %    MCV 88.1 80.0 - 100.0 fL    MCH 28.8 26.0 - 34.0 pg    MCHC 32.7 30.0 - 36.0 g/dL    RDW 86.5 78.4 - 69.6 %    Platelets 267 150 - 400 K/uL    nRBC 0.0 0.0 - 0.2 %      Comment: Performed at [facility], [address], [place], [place] [address]  Basic metabolic panel     Status: Abnormal    Collection Time: 10/12/20 12:22 AM  Result Value Ref Range    Sodium 136 135 - 145 mmol/L    Potassium 4.0 3.5 - 5.1 mmol/L    Chloride 103 98 - 111 mmol/L    CO2 22 22 - 32 mmol/L    Glucose, Bld 107 (H) 70 - 99 mg/dL      Comment: Glucose reference range applies only to samples taken after fasting for at least 8 hours.    BUN 24 (H) 8 - 23 mg/dL    Creatinine, Ser 4.13 0.44 - 1.00 mg/dL    Calcium 9.5 8.9 - 24.4 mg/dL    GFR, Estimated >01 >02 mL/min      Comment: (NOTE) Calculated using the CKD-EPI Creatinine Equation (2021)      Anion gap 11 5 - 15      Comment: Performed at [facility], [address] [place], [place] [address]  Troponin I (High Sensitivity)     Status: None    Collection Time: 10/12/20  3:43 PM  Result Value Ref Range    Troponin I (High Sensitivity) 17 <18 ng/L      Comment:  (NOTE) Elevated high sensitivity troponin I (hsTnI) values and significant  changes across serial measurements may suggest ACS but many other  chronic and acute conditions are known to elevate hsTnI results.  Refer to the "Links" section for chest pain algorithms and additional  guidance. Performed at [facility], [address] [place], [place] [address]    C-reactive protein     Status: Abnormal    Collection Time: 10/12/20  3:43 PM  Result Value  Ref Range    CRP 4.0 (H) <1.0 mg/dL      Comment: Performed at [facility], [address] [place], [place] [address]  Sedimentation rate     Status: None    Collection Time: 10/12/20  3:43 PM  Result Value Ref Range    Sed Rate 15 0 - 22 mm/hr      Comment: Performed at [facility], [address] [place], [place] [address]  Troponin I (High Sensitivity)     Status: Abnormal    Collection Time: 10/12/20  5:41 PM  Result Value Ref Range    Troponin I (High Sensitivity) 20 (H) <18 ng/L      Comment: (NOTE) Elevated high sensitivity troponin I (hsTnI) values and significant  changes across serial measurements may suggest ACS but many other  chronic and acute conditions are known to elevate hsTnI results.  Refer to the "Links" section for chest pain algorithms and additional  guidance. Performed at [facility], [address] [place], [place] [address]    Troponin I (High Sensitivity)     Status: Abnormal    Collection Time: 10/12/20  8:36 PM  Result Value Ref Range    Troponin I (High Sensitivity) 22 (H) <18 ng/L      Comment: (NOTE) Elevated high sensitivity troponin I (hsTnI) values and significant  changes across serial measurements may suggest ACS but many other  chronic and acute conditions are known to elevate hsTnI results.  Refer to the "Links" section for chest pain algorithms and additional  guidance. Performed at [facility], [address] [place], [place] [address]    CBC      Status: None    Collection Time: 10/13/20  1:52 AM  Result Value Ref Range    WBC 8.2 4.0 - 10.5 K/uL    RBC 5.06 3.87 - 5.11 MIL/uL    Hemoglobin 14.7 12.0 - 15.0 g/dL    HCT 95.244.7 84.136.0 - 32.446.0 %    MCV 88.3 80.0 - 100.0 fL    MCH 29.1 26.0 - 34.0 pg    MCHC 32.9 30.0 - 36.0 g/dL    RDW 40.114.7 02.711.5 - 25.315.5 %    Platelets 274 150 - 400 K/uL    nRBC 0.0 0.0 - 0.2 %      Comment: Performed at [facility], [address] [place], [place] [address]  Basic metabolic panel     Status: Abnormal    Collection Time: 10/13/20  1:52 AM  Result Value Ref Range    Sodium 138 135 - 145 mmol/L    Potassium 4.2 3.5 - 5.1 mmol/L    Chloride 106 98 - 111 mmol/L    CO2 21 (L) 22 - 32 mmol/L    Glucose, Bld 150 (H) 70 - 99 mg/dL      Comment: Glucose reference range applies only to samples taken after fasting for at least 8 hours.    BUN 20 8 - 23 mg/dL    Creatinine, Ser 3.470.96 0.44 - 1.00 mg/dL    Calcium 9.5 8.9 - 42.510.3 mg/dL    GFR, Estimated >95>60 >63>60 mL/min      Comment: (NOTE) Calculated using the CKD-EPI Creatinine Equation (2021)      Anion gap 11 5 - 15      Comment: Performed at [facility], [address] [place], [place] [address]       Imaging Results (  Last 48 hours)  CT HEAD WO CONTRAST   Result Date: 10/13/2020 CLINICAL DATA:  Follow-up examination for acute stroke. EXAM: CT HEAD WITHOUT CONTRAST TECHNIQUE: Contiguous axial images were obtained from the base of the skull through the vertex without intravenous contrast. COMPARISON:  Prior head CT from 10/09/2020 as well as earlier studies. FINDINGS: Brain: Continued interval evolution of patchy right MCA distribution infarcts involving the right insula and overlying posterior right frontal parietal cortex, stable in size and distribution as compared to previous exam. No evidence for hemorrhagic transformation or significant regional mass effect. Hyperdensity within the right sylvian fissure is decreased from previous exam,  likely a small amount of persistent subarachnoid hemorrhage. No new intracranial hemorrhage or large vessel territory infarct. No mass lesion, mass effect, or midline shift. No hydrocephalus or extra-axial fluid collection. Vascular: Decreasing hyperdensity at the right sylvian fissure without definite new hyperdense vessel. Skull: Scalp soft tissues and calvarium within normal limits. Sinuses/Orbits: Globes and orbital soft tissues demonstrate no acute finding. Paranasal sinuses and mastoid air cells remain clear. Other: None. IMPRESSION: 1. Continued interval evolution of patchy right MCA distribution infarcts, stable in size and distribution as compared to previous exam. No evidence for hemorrhagic transformation or significant regional mass effect. 2. Persistent but decreased hyperdensity within the right Sylvian fissure, likely a small amount of subarachnoid hemorrhage. 3. No other new acute intracranial abnormality. Electronically Signed   By: Rise Mu M.D.   On: 10/13/2020 01:36    DG CHEST PORT 1 VIEW   Result Date: 10/12/2020 CLINICAL DATA:  Chest pain. EXAM: PORTABLE CHEST 1 VIEW COMPARISON:  11/16/2017 FINDINGS: Persistent low lung volumes. Multi lead left-sided pacemaker in place. Mild cardiomegaly stable. Unchanged mediastinal contours. Aortic atherosclerosis. Scarring at the right lung base unchanged. No acute airspace disease. No pleural fluid or pneumothorax. Remote right rib fracture again seen. No acute osseous abnormalities. IMPRESSION: Low lung volumes without acute abnormality. Electronically Signed   By: Narda Rutherford M.D.   On: 10/12/2020 21:14             Medical Problem List and Plan: 1. Left side weakness secondary to acute ischemic right MCA infarction status post IR revascularization             -patient may  shower             -ELOS/Goals: 7-10 days, moe I to supervision with PT, OT 2.  Antithrombotics: -DVT/anticoagulation: SCDs             -antiplatelet  therapy: Low-dose aspirin 81 mg daily initiated 10/13/2020 3. Pain Management/chronic back pain: Topamax 25 mg twice daily, Fioricet as needed  -topamax just started this morning. Observe for effect 4. Mood: Provide emotional support             -antipsychotic agents: N/A 5. Neuropsych: This patient is capable of making decisions on her own behalf. 6. Skin/Wound Care: Routine skin checks 7. Fluids/Electrolytes/Nutrition: Routine in and outs with follow-up chemistries 8. Permissive hypertension. Monitor with increased mobility 9. COPD /tobacco abuse. NicoDerm patch. Provide counseling 10. Nonischemic cardiomyopathy/CHF. Ejection fraction 35%. Follow-up cardiology services             -daily weights  11. Urge incontinence: timed voids     Charlton Amor, PA-C 10/13/2020  I have personally performed a face to face diagnostic evaluation of this patient and formulated the key components of the plan.  Additionally, I have personally reviewed laboratory data, imaging studies, as well as relevant  notes and concur with the physician assistant's documentation above.  The patient's status has not changed from the original H&P.  Any changes in documentation from the acute care chart have been noted above.  Ranelle Oyster, MD, [place]

## 2020-10-14 DIAGNOSIS — N3941 Urge incontinence: Secondary | ICD-10-CM

## 2020-10-14 DIAGNOSIS — I5022 Chronic systolic (congestive) heart failure: Secondary | ICD-10-CM

## 2020-10-14 DIAGNOSIS — G441 Vascular headache, not elsewhere classified: Secondary | ICD-10-CM

## 2020-10-14 DIAGNOSIS — F411 Generalized anxiety disorder: Secondary | ICD-10-CM

## 2020-10-14 DIAGNOSIS — D72829 Elevated white blood cell count, unspecified: Secondary | ICD-10-CM

## 2020-10-14 DIAGNOSIS — R7303 Prediabetes: Secondary | ICD-10-CM

## 2020-10-14 LAB — CBC WITH DIFFERENTIAL/PLATELET
Abs Immature Granulocytes: 0.12 10*3/uL — ABNORMAL HIGH (ref 0.00–0.07)
Basophils Absolute: 0 10*3/uL (ref 0.0–0.1)
Basophils Relative: 0 %
Eosinophils Absolute: 0 10*3/uL (ref 0.0–0.5)
Eosinophils Relative: 0 %
HCT: 41.4 % (ref 36.0–46.0)
Hemoglobin: 13.9 g/dL (ref 12.0–15.0)
Immature Granulocytes: 1 %
Lymphocytes Relative: 6 %
Lymphs Abs: 0.7 10*3/uL (ref 0.7–4.0)
MCH: 29.2 pg (ref 26.0–34.0)
MCHC: 33.6 g/dL (ref 30.0–36.0)
MCV: 87 fL (ref 80.0–100.0)
Monocytes Absolute: 0.1 10*3/uL (ref 0.1–1.0)
Monocytes Relative: 1 %
Neutro Abs: 11.7 10*3/uL — ABNORMAL HIGH (ref 1.7–7.7)
Neutrophils Relative %: 92 %
Platelets: 291 10*3/uL (ref 150–400)
RBC: 4.76 MIL/uL (ref 3.87–5.11)
RDW: 14.5 % (ref 11.5–15.5)
WBC: 12.7 10*3/uL — ABNORMAL HIGH (ref 4.0–10.5)
nRBC: 0 % (ref 0.0–0.2)

## 2020-10-14 LAB — COMPREHENSIVE METABOLIC PANEL
ALT: 20 U/L (ref 0–44)
AST: 18 U/L (ref 15–41)
Albumin: 3.2 g/dL — ABNORMAL LOW (ref 3.5–5.0)
Alkaline Phosphatase: 56 U/L (ref 38–126)
Anion gap: 9 (ref 5–15)
BUN: 24 mg/dL — ABNORMAL HIGH (ref 8–23)
CO2: 21 mmol/L — ABNORMAL LOW (ref 22–32)
Calcium: 9.4 mg/dL (ref 8.9–10.3)
Chloride: 106 mmol/L (ref 98–111)
Creatinine, Ser: 0.98 mg/dL (ref 0.44–1.00)
GFR, Estimated: >60 mL/min (ref >60)
Glucose, Bld: 181 mg/dL — ABNORMAL HIGH (ref 70–99)
Potassium: 4.3 mmol/L (ref 3.5–5.1)
Sodium: 136 mmol/L (ref 135–145)
Total Bilirubin: 0.8 mg/dL (ref 0.3–1.2)
Total Protein: 6.3 g/dL — ABNORMAL LOW (ref 6.5–8.1)

## 2020-10-14 MED ORDER — SORBITOL 70 % SOLN
15.0000 mL | Freq: Every day | Status: DC | PRN
  Administered 2020-10-14: 15 mL via ORAL
  Filled 2020-10-14: qty 30

## 2020-10-14 NOTE — Progress Notes (Signed)
Occupational Therapy Session Note  Patient Details  Name: Rebecca Singh MRN: [id number] Date of Birth: [birth year]  Today's Date: 10/14/2020 OT Individual Time: 1130-1200 and 1430-1500 OT Individual Time Calculation (min): 30 min and 30 min   Short Term Goals: Week 1:  OT Short Term Goal 1 (Week 1): STG = LTGs due to ELOS  Skilled Therapeutic Interventions/Progress Updates:  1) Treatment session with focus on LUE NMR.  Pt received in sidelying with reports of headache but agreeable to therapy in room.  Engaged in gross and fine motor control of LUE.  Pt able to utilize palmar grasp but not cylindrical grasp to pick up large foam blocks.  Pt verbalizing frustration throughout tasks due to motor apraxia with difficulty completing finger flexion/extension.  Pt able to reach to grasp blocks and even stack one on top of the other, but unable to advance beyond 2 blocks.  Challenged pt to remove pegs from pegboard, however pt unable to coordinate fingers to complete tip to tip or 3 jaw chuck pinch.  Pt tearful in frustration.  Pt also reporting decreased sensation of pegs in hand.  Encouraged making fist and opening hand for exercise between sessions.  Returned to sidelying and reapplied ice to head per pt request.  2) Treatment session with focus on LUE NMR with resistive clothespins and large peg board.  Pt received in sidelying agreeable to therapy session, reporting slight headache but scheduled for meds at 3PM.  Pt reports completing fist and open exercises between sessions.  Pt completed bed mobility supervision up to EOB.  Engaged in grasp and release of large clothespins, while incorporating functional reach.  Therapist challenged pt to move all pegs to R and then up in pegboard to increase challenge on grasp and coordination. Pt with improved functional grasp this session, compared to previous sessions.  Therapist challenged pt to attempt finger to thumb opposition with pt able to complete to  first digit but no other due to increased ?tone and extreme flexion of fingers in to palm of hand requiring increased physical assist from therapist to open hands.  Therapist encouraged continued fist and open exercises as well as attempting thumb opposition.  Pt returned to sidelying and reapplied ice to head per pt request.   Therapy Documentation Precautions:  Precautions Precautions: None Precaution Comments: L hemi, L inattention Restrictions Weight Bearing Restrictions: No Pain: Pain Assessment Pain Scale: 0-10 Pain Score: 4  Pain Type: Acute pain Pain Location: Head Pain Descriptors / Indicators: Aching Pain Frequency: Constant Pain Onset: On-going Pain Intervention(s): Repositioned   Therapy/Group: Individual Therapy  Rosalio Loud 10/14/2020, 12:30 PM

## 2020-10-14 NOTE — Evaluation (Signed)
Physical Therapy Assessment and Plan  Patient Details  Name: Rebecca Singh MRN: [id number] Date of Birth: [birth year]  PT Diagnosis: Abnormal posture, Abnormality of gait, Coordination disorder, Difficulty walking, Hemiparesis non-dominant, Impaired sensation, Muscle weakness and Pain in head Rehab Potential: Good ELOS: 7-10 days   Today's Date: 10/14/2020 PT Individual Time: 1000-1053 PT Individual Time Calculation (min): 53 min    Hospital Problem: Principal Problem:   Right middle cerebral artery stroke ([place]) Active Problems:   Leukocytosis   Urge incontinence   Chronic systolic congestive heart failure (HCC)   Vascular headache   Anxiety state   Past Medical History:  Past Medical History:  Diagnosis Date  . AICD (automatic cardioverter/defibrillator) present   . COPD (chronic obstructive pulmonary disease) ([place])   . Hypothyroidism   . Myocardial infarction ([place])   . Pacemaker/defibrillator   . Stroke [facility])    Past Surgical History:  Past Surgical History:  Procedure Laterality Date  . ABDOMINAL HYSTERECTOMY    . CHOLECYSTECTOMY    . INSERT / REPLACE / REMOVE PACEMAKER    . IR PERCUTANEOUS ART THROMBECTOMY/INFUSION INTRACRANIAL INC DIAG ANGIO  10/07/2020  . RADIOLOGY WITH ANESTHESIA N/A 10/07/2020   Procedure: IR WITH ANESTHESIA - CODE STROKE;  Surgeon: Radiologist, Medication, MD;  Location: [place];  Service: Radiology;  Laterality: N/A;  . THYROIDECTOMY    . TONSILLECTOMY      Assessment & Plan Clinical Impression: Patient is a 65 y.o. 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 Per chart review patient lives with son and grandchildren sister and elderly father. Independent with assistive device. Mobile home 3 steps to entry.  She presented 10/07/2020 with acute onset of left-sided weakness. Noted blood pressure in the 190's. Admission chemistries unremarkable except glucose 109 creatinine 1.03, SARS coronavirus negative, troponin XX 4 urine drug screen negative. CT of the head showed hypodensity right frontal parietal lobe suspicious for acute infarction in addition there was a probable small hyperdense vessel in the right sylvian fissure involving the right MCA. CT angiogram of head and neck right M2/MCA posterior division branch occlusion with moderate distal branch opacification. No hemodynamically significant stenosis in the neck. Patient underwent revascularization per interventional radiology. Follow-up CT of the head showed small volume hyper density within the right sylvian fissure reflecting extravasated contrast and/or subarachnoid hemorrhage. Possible subtle acute infarct changes in the right frontal parietal lobe. Echocardiogram with ejection fraction of 30 to 63% grade 1 diastolic dysfunction. There was consideration of aspirin therapy currently on no antithrombotic due to small SAH awaiting plan for aspirin therapy with follow-up CT of the head completed 10/13/2020 showing continued interval evolution of patchy right MCA distribution infarct stable in size as compared to previous exam. No evidence for hemorrhagic transformation or significant regional mass-effect. Persistent but decreased hyper density within the right sylvian fissure likely a small amount of subarachnoid hemorrhage. No other acute abnormality.. Cardiology services consulted for both ICD interrogation for A. fib as well as nonspecific chest discomfort. EKG completed similar to distant prior EKG's.BIV rhythm 70s.. EKG consistent with myopericarditis for which she takes high-dose nonsteroidal's and colchicine in the past and currently completing a course of Solu-Medrol. Troponin was completed 24-17-20-22. Chest discomfort felt to be more pleuritic in nature.  Cardiology did sign off no further work-up currently indicated. She is  tolerating a regular consistency diet  Patient currently requires mod with mobility secondary to muscle weakness, decreased cardiorespiratoy endurance, impaired timing and sequencing, unbalanced muscle activation, decreased coordination and decreased motor planning and decreased standing balance, decreased postural control, hemiplegia and decreased balance strategies. Prior to hospitalization, patient was independent  with mobility and lived with Family in a Mobile home.  Home access is 7Stairs to enter.  Patient will benefit from skilled PT intervention to maximize safe functional mobility, minimize fall risk and decrease caregiver burden for planned discharge home with 24 hour assist.  Anticipate patient will benefit from follow up OP at discharge.  PT - End of Session Activity Tolerance: Tolerates 30+ min activity with multiple rests Endurance Deficit: Yes Endurance Deficit Description: required frequent rest breaks PT Assessment Rehab Potential (ACUTE/IP ONLY): Good PT Barriers to Discharge: Inaccessible home environment;Home environment access/layout PT Barriers to Discharge Comments: 7 STE with 2 handrails PT Patient demonstrates impairments in the following area(s): Balance;Endurance;Motor;Pain;Sensory PT Transfers Functional Problem(s): Bed Mobility;Bed to Chair;Car;Furniture PT Locomotion Functional Problem(s): Ambulation;Wheelchair Mobility;Stairs PT Plan PT Intensity: Minimum of 1-2 x/day ,45 to 90 minutes PT Frequency: 5 out of 7 days PT Duration Estimated Length of Stay: 7-10 days PT Treatment/Interventions: Ambulation/gait training;Discharge planning;Functional mobility training;Psychosocial support;Therapeutic Activities;Balance/vestibular training;Disease management/prevention;Neuromuscular re-education;Skin care/wound management;Therapeutic Exercise;Wheelchair propulsion/positioning;Cognitive  remediation/compensation;DME/adaptive equipment instruction;Pain management;Splinting/orthotics;UE/LE Strength taining/ROM;Community reintegration;Functional electrical stimulation;Patient/family education;Stair training;UE/LE Coordination activities PT Transfers Anticipated Outcome(s): Mod I PT Locomotion Anticipated Outcome(s): supervision with LRAD PT Recommendation Follow Up Recommendations: Outpatient PT Patient destination: Home Equipment Recommended: To be determined Equipment Details: pt has RW  PT Evaluation Precautions/Restrictions Precautions Precautions: None Precaution Comments: L hemi, L inattention Restrictions Weight Bearing Restrictions: No Home Living/Prior Functioning Home Living Available Help at Discharge: Available 24 hours/day;Family Type of Home: Mobile home Home Access: Stairs to enter Entrance Stairs-Number of Steps: 7 Entrance Stairs-Rails: Left;Right;Can reach both Home Layout: One level Bathroom Shower/Tub: Multimedia programmer: Standard Bathroom Accessibility: Yes  Lives With: Family Prior Function Level of Independence: Independent with basic ADLs;Independent with homemaking with ambulation;Independent with gait;Independent with transfers  Able to Take Stairs?: Yes Driving: Yes Vocation: Part time employment Vocation Requirements: Scientist, product/process development at [employer] Comments: reports L TKA in Sept 21 Cognition Overall Cognitive Status: Impaired/Different from baseline Arousal/Alertness: Awake/alert Orientation Level: Oriented X4 Memory: Appears intact Awareness: Appears intact Problem Solving: Appears intact Safety/Judgment: Appears intact Sensation Sensation Light Touch: Impaired by gross assessment Proprioception: Impaired by gross assessment Proprioception Impaired Details: Impaired LUE;Impaired LLE Additional Comments: decreased sensation along LLE compared to RLE Coordination Gross Motor Movements are Fluid and Coordinated:  No Fine Motor Movements are Fluid and Coordinated: No Coordination and Movement Description: grossly uncoordinated due to L hemi, decreased balance/postural control, generalized weakness, and poor endurance Finger Nose Finger Test: WFL on RUE, decreased ROM on LUE Heel Shin Test: WFL on RLE, significantly decreased hip flexion on LLE Motor  Motor Motor: Abnormal postural alignment and control;Hemiplegia Motor - Skilled Clinical Observations: grossly uncoordinated due to L hemi, decreased balance/postural control, generalized weakness, and poor endurance  Trunk/Postural Assessment  Cervical Assessment Cervical Assessment: Within Functional Limits Thoracic Assessment Thoracic Assessment: Exceptions to [facility] (rounded shoulders) Lumbar Assessment Lumbar Assessment: Exceptions to [facility] (posterior pelvic tilt) Postural Control Postural Control: Deficits on evaluation  Balance Balance Balance Assessed: Yes Static Sitting Balance Static Sitting - Balance Support: Feet supported;Bilateral upper extremity supported Static Sitting - Level of Assistance: 5: Stand by assistance (supervision) Dynamic Sitting Balance Dynamic Sitting - Balance Support: Feet supported;No upper  extremity supported Dynamic Sitting - Level of Assistance: 5: Stand by assistance (supervision) Static Standing Balance Static Standing - Balance Support: No upper extremity supported Static Standing - Level of Assistance: 4: Min assist Dynamic Standing Balance Dynamic Standing - Balance Support: No upper extremity supported Dynamic Standing - Level of Assistance: 3: Mod assist Extremity Assessment  RLE Assessment RLE Assessment: Exceptions to [facility] General Strength Comments: grossly generalized to 4-/5 LLE Assessment LLE Assessment: Exceptions to [facility] LLE Strength Left Hip Flexion: 3+/5 Left Hip ABduction: 3/5 Left Hip ADduction: 3+/5 Left Knee Flexion: 3-/5 Left Knee Extension: 3/5 Left Ankle Dorsiflexion: 3/5 Left  Ankle Plantar Flexion: 3-/5  Care Tool Care Tool Bed Mobility Roll left and right activity   Roll left and right assist level: Supervision/Verbal cueing    Sit to lying activity   Sit to lying assist level: Supervision/Verbal cueing    Lying to sitting edge of bed activity   Lying to sitting edge of bed assist level: Contact Guard/Touching assist     Care Tool Transfers Sit to stand transfer   Sit to stand assist level: Minimal Assistance - Patient > 75%    Chair/bed transfer   Chair/bed transfer assist level: Moderate Assistance - Patient 50 - 74%     Toilet transfer   Assist Level: Moderate Assistance - Patient 50 - 74%    Car transfer   Car transfer assist level: Minimal Assistance - Patient > 75%      Care Tool Locomotion Ambulation   Assist level: Moderate Assistance - Patient 50 - 74% Assistive device: No Device Max distance: 7f  Walk 10 feet activity   Assist level: Moderate Assistance - Patient - 50 - 74% Assistive device: No Device   Walk 50 feet with 2 turns activity   Assist level: Moderate Assistance - Patient - 50 - 74% Assistive device: No Device  Walk 150 feet activity Walk 150 feet activity did not occur: Safety/medical concerns (fatigue, L hemi, weakness, poor endurance)      Walk 10 feet on uneven surfaces activity Walk 10 feet on uneven surfaces activity did not occur: Safety/medical concerns (fatigue, L hemi, weakness, poor endurance)      Stairs   Assist level: Moderate Assistance - Patient - 50 - 74% Stairs assistive device: 2 hand rails Max number of stairs: 4  Walk up/down 1 step activity   Walk up/down 1 step (curb) assist level: Moderate Assistance - Patient - 50 - 74% Walk up/down 1 step or curb assistive device: 2 hand rails    Walk up/down 4 steps activity Walk up/down 4 steps assist level: Moderate Assistance - Patient - 50 - 74% Walk up/down 4 steps assistive device: 2 hand rails  Walk up/down 12 steps activity Walk up/down 12  steps activity did not occur: Safety/medical concerns (fatigue, L hemi, weakness, poor endurance)      Pick up small objects from floor Pick up small object from the floor (from standing position) activity did not occur: Safety/medical concerns (fatigue, L hemi, weakness, poor balance)      Wheelchair Will patient use wheelchair at discharge?: Yes Type of Wheelchair: Manual   Wheelchair assist level: Minimal Assistance - Patient > 75% Max wheelchair distance: 524f Wheel 50 feet with 2 turns activity   Assist Level: Minimal Assistance - Patient > 75%  Wheel 150 feet activity   Assist Level: Maximal Assistance - Patient 25 - 49%    Refer to Care Plan for Long Term Goals  SHORT  TERM GOAL WEEK 1 PT Short Term Goal 1 (Week 1): STG=LTG due to LOS  Recommendations for other services: None   Skilled Therapeutic Intervention Evaluation completed (see details above and below) with education on PT POC and goals and individual treatment initiated with focus on functional mobility/transfers, generalized strengthening, dynamic standing balance/coordination, ambulation, simulated car transfers, and improved activity tolerance. Received pt supine in recliner, pt educated on PT evaluation, CIR policies, and therapy schedule and agreeable. Pt reported mild headache during session but stated it is much better than last night. Pt transferred recliner<>WC stand<>pivot without AD and mod A and performed WC mobility 36f using L hemi technique and min A but stopped due to UE fatigue. Pt transported remainder of way [facility] and performed simulated car transfer without AD and min A with therapist supporting LUE. Pt then ambulated 562fwithout AD and mod A overall with LUE supported around therapist's shoulder with use of R railing during first [id number] demonstrated decreased L knee extension, absent heel strike on L, wide BOS, decreased step length and foot clearance, and flexed trunk. Pt  navigated 4 steps with 2 rails with heavy mod A ascending and descending with a step to pattern. Pt with poor eccentric control when descending stairs resulting in heavy "plop" of each LE. Pt required cues for up with the good, down with the bad technique. Pt reported increased discomfort from headache stating "are we done now?" and requested to return to room. Pt [facility] and transferred WC<>bed stand<>pivot with min A and sit<>supine with supervision. Concluded session with pt supine in bed, needs within reach, and bed alarm on.   Mobility Bed Mobility Bed Mobility: Rolling Right;Sit to Supine Rolling Right: Supervision/verbal cueing Sit to Supine: Supervision/Verbal cueing Transfers Transfers: Sit to Stand;Stand to Sit;Stand Pivot Transfers Sit to Stand: Minimal Assistance - Patient > 75% Stand to Sit: Contact Guard/Touching assist Stand Pivot Transfers: Moderate Assistance - Patient 50 - 74% Stand Pivot Transfer Details: Verbal cues for technique Stand Pivot Transfer Details (indicate cue type and reason): verbal cues for hand placement on WC armrests Transfer (Assistive device): None Locomotion  Gait Ambulation: Yes Gait Assistance: Moderate Assistance - Patient 50-74% Gait Distance (Feet): 50 Feet Assistive device: None Gait Assistance Details: Verbal cues for gait pattern Gait Assistance Details: verbal cues to increase step length for safety Gait Gait: Yes Gait Pattern: Impaired Gait Pattern: Step-to pattern;Decreased stride length;Decreased dorsiflexion - left;Decreased trunk rotation;Wide base of support;Shuffle;Decreased weight shift to left;Decreased step length - left;Decreased step length - right;Trunk flexed;Poor foot clearance - left;Poor foot clearance - right Gait velocity: decreased Stairs / Additional Locomotion Stairs: Yes Stairs Assistance: Moderate Assistance - Patient 50 - 74% Stair Management Technique: Two rails Number of Stairs:  4 Height of Stairs: 6 Wheelchair Mobility Wheelchair Mobility: Yes Wheelchair Assistance: Minimal assistance - Patient >75% Wheelchair Propulsion: Right upper extremity;Right lower extremity Wheelchair Parts Management: Needs assistance Distance: [id number] Criteria: Patient will be discharged from PT if patient refuses treatment 3 consecutive times without medical reason, if treatment goals not met, if there is a change in medical status, if patient makes no progress towards goals or if patient is discharged from hospital.  The above assessment, treatment plan, treatment alternatives and goals were discussed and mutually agreed upon: by patient  AnnaAlfonse Alpers DPT  10/14/2020, 12:17 PM

## 2020-10-14 NOTE — Progress Notes (Signed)
Inpatient Rehabilitation  Patient information reviewed and entered into eRehab system by Maddie Brazier M. Thao Vanover, M.A., CCC/SLP, PPS Coordinator.  Information including medical coding, functional ability and quality indicators will be reviewed and updated through discharge.    

## 2020-10-14 NOTE — Progress Notes (Signed)
[facility] PHYSICAL MEDICINE & REHABILITATION PROGRESS NOTE  Subjective/Complaints: Patient seen laying in bed this morning.  She states she did not sleep well overnight due to headaches.  She states she did not get her medications on time (was ~an hour late) and she felt she was suffering.  She is about to work with therapies, discussed with therapies mood.  She also notes constipation.  ROS: Denies CP, SOB, N/V/D  Objective: Vital Signs: Blood pressure 122/70, pulse (!) 50, temperature 98.1 F (36.7 C), temperature source Oral, resp. rate 18, height 5\' 8"  (1.727 m), weight 93.7 kg, SpO2 97 %. CT HEAD WO CONTRAST  Result Date: 10/13/2020 CLINICAL DATA:  Follow-up examination for acute stroke. EXAM: CT HEAD WITHOUT CONTRAST TECHNIQUE: Contiguous axial images were obtained from the base of the skull through the vertex without intravenous contrast. COMPARISON:  Prior head CT from 10/09/2020 as well as earlier studies. FINDINGS: Brain: Continued interval evolution of patchy right MCA distribution infarcts involving the right insula and overlying posterior right frontal parietal cortex, stable in size and distribution as compared to previous exam. No evidence for hemorrhagic transformation or significant regional mass effect. Hyperdensity within the right sylvian fissure is decreased from previous exam, likely a small amount of persistent subarachnoid hemorrhage. No new intracranial hemorrhage or large vessel territory infarct. No mass lesion, mass effect, or midline shift. No hydrocephalus or extra-axial fluid collection. Vascular: Decreasing hyperdensity at the right sylvian fissure without definite new hyperdense vessel. Skull: Scalp soft tissues and calvarium within normal limits. Sinuses/Orbits: Globes and orbital soft tissues demonstrate no acute finding. Paranasal sinuses and mastoid air cells remain clear. Other: None. IMPRESSION: 1. Continued interval evolution of patchy right MCA distribution  infarcts, stable in size and distribution as compared to previous exam. No evidence for hemorrhagic transformation or significant regional mass effect. 2. Persistent but decreased hyperdensity within the right Sylvian fissure, likely a small amount of subarachnoid hemorrhage. 3. No other new acute intracranial abnormality. Electronically Signed   By: 10/11/2020 M.D.   On: 10/13/2020 01:36   DG CHEST PORT 1 VIEW  Result Date: 10/12/2020 CLINICAL DATA:  Chest pain. EXAM: PORTABLE CHEST 1 VIEW COMPARISON:  11/16/2017 FINDINGS: Persistent low lung volumes. Multi lead left-sided pacemaker in place. Mild cardiomegaly stable. Unchanged mediastinal contours. Aortic atherosclerosis. Scarring at the right lung base unchanged. No acute airspace disease. No pleural fluid or pneumothorax. Remote right rib fracture again seen. No acute osseous abnormalities. IMPRESSION: Low lung volumes without acute abnormality. Electronically Signed   By: 01/16/2018 M.D.   On: 10/12/2020 21:14   Recent Labs    10/13/20 0152 10/14/20 0448  WBC 8.2 12.7*  HGB 14.7 13.9  HCT 44.7 41.4  PLT 274 291   Recent Labs    10/13/20 0152 10/14/20 0448  NA 138 136  K 4.2 4.3  CL 106 106  CO2 21* 21*  GLUCOSE 150* 181*  BUN 20 24*  CREATININE 0.96 0.98  CALCIUM 9.5 9.4    Intake/Output Summary (Last 24 hours) at 10/14/2020 0850 Last data filed at 10/14/2020 0000 Gross per 24 hour  Intake 52 ml  Output 1 ml  Net 51 ml        Physical Exam: BP 122/70 (BP Location: Left Arm)   Pulse (!) 50   Temp 98.1 F (36.7 C) (Oral)   Resp 18   Ht 5\' 8"  (1.727 m)   Wt 93.7 kg   SpO2 97%   BMI 31.41 kg/m  Constitutional:  No distress . Vital signs reviewed.  Obese. HENT: Normocephalic.  Atraumatic. Eyes: EOMI. No discharge. Cardiovascular: No JVD.  RRR. Respiratory: Normal effort.  No stridor.  Bilateral clear to auscultation. GI: Non-distended.  BS +. Skin: Warm and dry.  Intact. Psych: Anxious.   Tearful. Musc: No edema in extremities.  No tenderness in extremities. Neuro: Alert Follows commands.  Left homonymous hemianopsia.  LUE: 4 -/5 proximal distal with apraxia Left lower extremity: 4/5 proximal and distal  Assessment/Plan: 1. Functional deficits which require 3+ hours per day of interdisciplinary therapy in a comprehensive inpatient rehab setting.  Physiatrist is providing close team supervision and 24 hour management of active medical problems listed below.  Physiatrist and rehab team continue to assess barriers to discharge/monitor patient progress toward functional and medical goals   Care Tool:  Bathing              Bathing assist       Upper Body Dressing/Undressing Upper body dressing        Upper body assist      Lower Body Dressing/Undressing Lower body dressing            Lower body assist       Toileting Toileting    Toileting assist       Transfers Chair/bed transfer  Transfers assist           Locomotion Ambulation   Ambulation assist              Walk 10 feet activity   Assist           Walk 50 feet activity   Assist           Walk 150 feet activity   Assist           Walk 10 feet on uneven surface  activity   Assist           Wheelchair     Assist               Wheelchair 50 feet with 2 turns activity    Assist            Wheelchair 150 feet activity     Assist           Medical Problem List and Plan: 1.Left side weaknesssecondary to acute ischemic right MCA infarction status post IR revascularization  Begin CIR evaluations 2. Antithrombotics: -DVT/anticoagulation:SCDs -antiplatelet therapy: Low-dose aspirin 81 mg daily initiated 10/13/2020 3. Pain Management/chronic back pain/vascular headache:Topamax 25 mg twice daily,Fioricet as needed  Will likely require increasing Topamax  Tylenol as needed 4. Mood:Provide  emotional support  May require medication for anxiety -antipsychotic agents: N/A 5. Neuropsych: This patientiscapable of making decisions on herown behalf. 6. Skin/Wound Care:Routine skin checks 7. Fluids/Electrolytes/Nutrition:Routine in and outs. 8. COPD/tobacco abuse. NicoDerm patch.   Counsel 9. Nonischemic cardiomyopathy/CHF. Ejection fraction 35%. Follow-up cardiology services Filed Weights   10/13/20 1751 10/13/20 1801  Weight: 93.7 kg 93.7 kg   10. Urge incontinence: timed voids  PVRs ordered 11.  Prediabetes  Hemoglobin A1c 6.1 on 09/2020  Monitor with increased mobility 12.  Leukocytosis  WBCs 12.7 on 2/1, labs ordered for tomorrow  Afebrile, no signs/symptoms of infection  LOS: 1 days A FACE TO FACE EVALUATION WAS PERFORMED  Shalee Paolo Karis Juba 10/14/2020, 8:50 AM

## 2020-10-14 NOTE — Plan of Care (Signed)
  Problem: Consults Goal: RH STROKE PATIENT EDUCATION Description: See Patient Education module for education specifics  Outcome: Progressing Goal: Diabetes Guidelines if Diabetic/Glucose > 140 Description: If diabetic or lab glucose is > 140 mg/dl - Initiate Diabetes/Hyperglycemia Guidelines & Document Interventions  Outcome: Progressing   Problem: RH SAFETY Goal: RH STG ADHERE TO SAFETY PRECAUTIONS W/ASSISTANCE/DEVICE Description: STG Adhere to Safety Precautions With Mod I Assistance/Device. Outcome: Progressing Goal: RH STG DECREASED RISK OF FALL WITH ASSISTANCE Description: STG Decreased Risk of Fall With Mod I Assistance. Outcome: Progressing   Problem: RH COGNITION-NURSING Goal: RH STG USES MEMORY AIDS/STRATEGIES W/ASSIST TO PROBLEM SOLVE Description: STG Uses Memory Aids/Strategies With Mod I Assistance to Problem Solve. Outcome: Progressing Goal: RH STG ANTICIPATES NEEDS/CALLS FOR ASSIST W/ASSIST/CUES Description: STG Anticipates Needs/Calls for Assist With Mod I Assistance/Cues. Outcome: Progressing   Problem: RH COGNITION-NURSING Goal: RH STG USES MEMORY AIDS/STRATEGIES W/ASSIST TO PROBLEM SOLVE Description: STG Uses Memory Aids/Strategies With Mod I Assistance to Problem Solve. Outcome: Progressing   Problem: RH COGNITION-NURSING Goal: RH STG ANTICIPATES NEEDS/CALLS FOR ASSIST W/ASSIST/CUES Description: STG Anticipates Needs/Calls for Assist With Mod I Assistance/Cues. Outcome: Progressing   Problem: RH PAIN MANAGEMENT Goal: RH STG PAIN MANAGED AT OR BELOW PT'S PAIN GOAL Description: Assess and treat pain q shift and as needed Outcome: Progressing   Problem: RH KNOWLEDGE DEFICIT Goal: RH STG INCREASE KNOWLEDGE OF DIABETES Description: Mod I  Outcome: Progressing Goal: RH STG INCREASE KNOWLEDGE OF HYPERTENSION Outcome: Progressing

## 2020-10-14 NOTE — Evaluation (Signed)
Occupational Therapy Assessment and Plan  Patient Details  Name: Rebecca Singh MRN: [id number] Date of Birth: [birth year]  OT Diagnosis: apraxia, disturbance of vision, hemiplegia affecting non-dominant side and muscle weakness (generalized) Rehab Potential: Rehab Potential (ACUTE ONLY): Good ELOS: 7-10 days   Today's Date: 10/14/2020 OT Individual Time: 5035-4656 OT Individual Time Calculation (min): 60 min     Hospital Problem: Principal Problem:   Right middle cerebral artery stroke ([place]) Active Problems:   Leukocytosis   Urge incontinence   Chronic systolic congestive heart failure (H[facility])   Vascular headache   Anxiety state   Past Medical History:  Past Medical History:  Diagnosis Date  . AICD (automatic cardioverter/defibrillator) present   . COPD (chronic obstructive pulmonary disease) ([place])   . Hypothyroidism   . Myocardial infarction ([place])   . Pacemaker/defibrillator   . Stroke [facility])    Past Surgical History:  Past Surgical History:  Procedure Laterality Date  . ABDOMINAL HYSTERECTOMY    . CHOLECYSTECTOMY    . INSERT / REPLACE / REMOVE PACEMAKER    . IR PERCUTANEOUS ART THROMBECTOMY/INFUSION INTRACRANIAL INC DIAG ANGIO  10/07/2020  . RADIOLOGY WITH ANESTHESIA N/A 10/07/2020   Procedure: IR WITH ANESTHESIA - CODE STROKE;  Surgeon: Radiologist, Medication, MD;  Location: [place];  Service: Radiology;  Laterality: N/A;  . THYROIDECTOMY    . TONSILLECTOMY      Assessment & Plan Clinical Impression: Patient is a 65 y.o.  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 Per chart review patient lives with son and grandchildren sister and elderly father. Independent with assistive device. Mobile home 3 steps to entry. She presented  10/07/2020 with acute onset of left-sided weakness. Noted blood pressure in the 190s. Admission chemistries unremarkable except glucose 109 creatinine 1.03, SARS coronavirus negative, troponin XX 4 urine drug screen negative. CT of the head showed hypodensity right frontal parietal lobe suspicious for acute infarction in addition there was a probable small hyperdense vessel in the right sylvian fissure involving the right MCA. CT angiogram of head and neck right M2/MCA posterior division branch occlusion with moderate distal branch opacification. No hemodynamically significant stenosis in the neck. Patient underwent revascularization per interventional radiology. Follow-up CT of the head showed small volume hyperdensity within the right sylvian fissure reflectingextravasatedcontrast and/or subarachnoid hemorrhage. Possible subtle acute infarct changes in the right frontal parietal lobe. Echocardiogram with ejection fraction of 30 to 81% grade 1 diastolic dysfunction. Follow-up cranial CT scan 10/13/2020 showed continued interval evolution of patchy right MCA distribution infarct stable in size and distribution as compared to previous exam. No evidence of hemorrhagic transformation or significant mass-effect. Persistent but decreased hyperdensity within the right sylvian fissure likely a small amount of subarachnoid hemorrhage. Patient was cleared to begin low-dose aspirin for CVA prophylaxis. Cardiology services consulted for both ICD interrogation for A. fib as well as nonspecific chest discomfort. EKG completed similar to distant prior EKGs.biVrhythm 75s.. EKG consistent with myopericarditis for which she takes high-dose nonsteroidals and colchicine in the past and currently completing a course of Solu-Medrol. Troponin was completed 24-17-20-22. Chest discomfort felt to be more pleuritic in nature. Cardiology did sign off no further work-up currently indicated. She is tolerating a regular consistency diet.  Due to patient's left side weakness and decrease in functional ability physical medicine rehab consult requested and  patient was admitted for a comprehensive rehab program. Patient transferred to CIR on 10/13/2020 .    Patient currently requires min-mod assist with basic self-care skills secondary to muscle weakness, decreased cardiorespiratoy endurance, abnormal tone, unbalanced muscle activation, motor apraxia and decreased coordination, decreased visual acuity, decreased visual perceptual skills and hemianopsia, decreased attention to left and decreased standing balance and decreased balance strategies.  Prior to hospitalization, patient could complete ADLs with modified independent .  Patient will benefit from skilled intervention to increase independence with basic self-care skills prior to discharge home with care partner.  Anticipate patient will require intermittent supervision and follow up outpatient.  OT - End of Session Activity Tolerance: Tolerates 30+ min activity with multiple rests Endurance Deficit: Yes Endurance Deficit Description: required frequent rest breaks OT Assessment Rehab Potential (ACUTE ONLY): Good OT Barriers to Discharge: Home environment access/layout OT Barriers to Discharge Comments: 7 steps to enter OT Patient demonstrates impairments in the following area(s): Balance;Endurance;Motor;Pain;Perception;Safety;Sensory;Skin Integrity;Vision OT Basic ADL's Functional Problem(s): Grooming;Bathing;Dressing;Toileting OT Advanced ADL's Functional Problem(s): Simple Meal Preparation;Light Housekeeping OT Transfers Functional Problem(s): Toilet;Tub/Shower OT Additional Impairment(s): Fuctional Use of Upper Extremity OT Plan OT Intensity: Minimum of 1-2 x/day, 45 to 90 minutes OT Frequency: 5 out of 7 days OT Duration/Estimated Length of Stay: 7-10 days OT Treatment/Interventions: Balance/vestibular training;Community reintegration;Discharge planning;Disease  mangement/prevention;DME/adaptive equipment instruction;Functional electrical stimulation;Functional mobility training;Neuromuscular re-education;Pain management;Patient/family education;Psychosocial support;Self Care/advanced ADL retraining;Skin care/wound managment;Splinting/orthotics;Therapeutic Activities;Therapeutic Exercise;UE/LE Strength taining/ROM;UE/LE Coordination activities;Visual/perceptual remediation/compensation OT Basic Self-Care Anticipated Outcome(s): Mod I OT Toileting Anticipated Outcome(s): Mod I OT Bathroom Transfers Anticipated Outcome(s): Mod I OT Recommendation Recommendations for Other Services: Neuropsych consult;Therapeutic Recreation consult Therapeutic Recreation Interventions: Stress management;Pet therapy Patient destination: Home Follow Up Recommendations: Outpatient OT Equipment Recommended: Tub/shower seat   OT Evaluation Precautions/Restrictions  Precautions Precautions: Fall Precaution Comments: L inattention Restrictions Weight Bearing Restrictions: No General   Vital Signs Therapy Vitals Temp: 98.1 F (36.7 C) Temp Source: Oral Pulse Rate: (!) 50 Resp: 18 BP: 122/70 Patient Position (if appropriate): Lying Oxygen Therapy SpO2: 97 % O2 Device: Room Air Pain Pain Assessment Pain Scale: 0-10 Pain Score: 6  Pain Type: Acute pain Pain Location: Head Pain Orientation: Right Pain Descriptors / Indicators: Aching Pain Frequency: Constant Pain Onset: On-going Pain Intervention(s): Medication (See eMAR);Cold applied Home Living/Prior [place] expects to be discharged to:: Private residence Living Arrangements: Alone Available Help at Discharge: Available 24 hours/day,Family Type of Home: Mobile home Home Access: Stairs to enter Entrance Stairs-Number of Steps: 7 Entrance Stairs-Rails: Can reach both Home Layout: One level Bathroom Shower/Tub: Multimedia programmer: Standard Bathroom  Accessibility: Yes Additional Comments: father that is 66 yo lives in the home as well as grandson 28 granddaughter 74 yo, oldest son, sister  Lives With: Family IADL History Homemaking Responsibilities: Yes Meal Prep Responsibility: Therapist, occupational Responsibility: Primary Cleaning Responsibility: Primary Bill Paying/Finance Responsibility: Primary Shopping Responsibility: Primary Current License: Yes Mode of Transportation: Public relations account executive: high school plus two years college Prior Function Level of Independence: Independent with basic ADLs,Independent with homemaking with ambulation,Independent with gait  Able to Take Stairs?: Yes Driving: Yes Vocation: Part time employment Vocation Requirements: Scientist, product/process development at [employer]: reports TKA in Sept 21 Vision Baseline Vision/History: Wears glasses Wears Glasses: Reading only ("not all the time") Patient Visual Report:  ("everything just runs together") Vision Assessment?: Vision impaired- to be further tested in functional context;Yes Visual Fields: Left homonymous hemianopsia Additional Comments: Pt continues to report blurry vision, stating "items just run together".  Pt able to locate items on R and L side of counter at sink.  Reports diplopia when headaches come on. Perception  Perception: Impaired Inattention/Neglect: Does not attend to left visual field Praxis Praxis: Impaired Praxis Impairment Details: Motor planning Cognition Overall Cognitive Status: Impaired/Different from baseline Arousal/Alertness: Awake/alert Orientation Level: Person;Place;Situation Person: Oriented Place: Oriented Situation: Oriented Year: 2022 Month: January Day of Week: Correct Immediate Memory Recall: Sock;Blue;Bed Memory Recall Sock: Without Cue Memory Recall Blue: Without Cue Memory Recall Bed: Without Cue Attention: Selective Selective Attention: Appears intact Awareness: Appears intact Problem Solving: Appears  intact Executive Function: Writer: Impaired Behaviors: Lability (Anxious and tearful) Safety/Judgment: Appears intact Sensation Sensation Light Touch: Impaired by gross assessment Peripheral sensation comments: decreased in L forearm, absent in hand and digits Light Touch Impaired Details: Absent LUE;Impaired LUE Proprioception: Impaired by gross assessment Proprioception Impaired Details: Impaired LUE;Impaired LLE Additional Comments: decreased sensation along LLE compared to RLE Coordination Gross Motor Movements are Fluid and Coordinated: No Fine Motor Movements are Fluid and Coordinated: No Coordination and Movement Description: grossly uncoordinated due to L hemi, decreased balance/postural control, generalized weakness, and poor endurance Finger Nose Finger Test: WFL on RUE, decreased ROM on LUE Heel Shin Test: WFL on RLE, significantly decreased hip flexion on LLE Motor  Motor Motor: Abnormal postural alignment and control;Hemiplegia Motor - Skilled Clinical Observations: grossly uncoordinated due to L hemi, decreased balance/postural control, generalized weakness, and poor endurance  Trunk/Postural Assessment  Cervical Assessment Cervical Assessment: Within Functional Limits Thoracic Assessment Thoracic Assessment: Exceptions to [facility] (rounded shoulders) Lumbar Assessment Lumbar Assessment: Exceptions to [facility] (posterior pelvic tilt) Postural Control Postural Control: Deficits on evaluation  Balance Balance Balance Assessed: Yes Static Sitting Balance Static Sitting - Balance Support: Feet supported;Bilateral upper extremity supported Static Sitting - Level of Assistance: 5: Stand by assistance (supervision) Dynamic Sitting Balance Dynamic Sitting - Balance Support: Feet supported;No upper extremity supported Dynamic Sitting - Level of Assistance: 5: Stand by assistance (supervision) Static Standing Balance Static Standing - Balance Support: No upper  extremity supported Static Standing - Level of Assistance: 4: Min assist Dynamic Standing Balance Dynamic Standing - Balance Support: No upper extremity supported Dynamic Standing - Level of Assistance: 3: Mod assist Extremity/Trunk Assessment RUE Assessment RUE Assessment: Within Functional Limits General Strength Comments: grossly 5/5 LUE Assessment LUE Assessment: Exceptions to [facility] Active Range of Motion (AROM) Comments: WNL, unable to maintain against gravity, decreased wrist and finger flexion/extension General Strength Comments: grossly2-3/5, loose gross palmar grasp  Care Tool Care Tool Self Care Eating        Oral Care    Oral Care Assist Level: Set up assist    Bathing   Body parts bathed by patient: Left arm;Chest;Abdomen;Front perineal area;Buttocks;Right upper leg;Left upper leg;Face;Right lower leg Body parts bathed by helper: Right arm;Left lower leg   Assist Level: Minimal Assistance - Patient > 75%    Upper Body Dressing(including orthotics)   What is the patient wearing?: Pull over shirt   Assist Level: Minimal Assistance - Patient > 75%    Lower Body Dressing (excluding footwear)   What is the patient wearing?: Pants Assist for lower body dressing: Moderate Assistance - Patient 50 - 74%    Putting on/Taking off footwear   What is the patient wearing?: Non-skid slipper socks Assist for footwear: Minimal Assistance - Patient > 75%       Care Tool Toileting Toileting activity   Assist for toileting: Minimal Assistance - Patient > 75%  Care Tool Bed Mobility Roll left and right activity   Roll left and right assist level: Supervision/Verbal cueing    Sit to lying activity   Sit to lying assist level: Supervision/Verbal cueing    Lying to sitting edge of bed activity   Lying to sitting edge of bed assist level: Contact Guard/Touching assist     Care Tool Transfers Sit to stand transfer   Sit to stand assist level: Minimal Assistance -  Patient > 75%    Chair/bed transfer   Chair/bed transfer assist level: Moderate Assistance - Patient 50 - 74%     Toilet transfer   Assist Level: Moderate Assistance - Patient 50 - 74%     Care Tool Cognition Expression of Ideas and Wants Expression of Ideas and Wants: Without difficulty (complex and basic) - expresses complex messages without difficulty and with speech that is clear and easy to understand   Understanding Verbal and Non-Verbal Content Understanding Verbal and Non-Verbal Content: Understands (complex and basic) - clear comprehension without cues or repetitions   Memory/Recall Ability *first 3 days only Memory/Recall Ability *first 3 days only: Current season;Location of own room;That he or she is in a hospital/hospital unit    Refer to Care Plan for Branch 1 OT Short Term Goal 1 (Week 1): STG = LTGs due to ELOS  Recommendations for other services: Neuropsych and Therapeutic Recreation  Pet therapy and Outing/community reintegration   Skilled Therapeutic Intervention OT eval completed with discussion of rehab process, OT purpose, POC, ELOS, and goals.  ADL assessment completed with focus on functional transfers, dynamic standing balance, and use of LUE during bathing and dressing tasks.  Pt received supine in bed reporting having an "awful" night overnight.  Pt tearful but not wanting to discuss events of the evening.  Pt agreeable to therapy so that she can "get out of here".  Pt agreeable to shower.  Pt completed bed mobility CGA.  Min assist sit > stand and ambulated to toilet without AD with mod assist.  Pt ambulated to tub bench in room shower with min assist without AD, with grab bars.  Pt completed bathing at sit > stand level with min assist for dynamic standing balance.  Pt utilizing LUE as gross assist during bathing, with gross palmar grasp to maintain hold on shampoo bottle.  Pt with decreased DIP/PIP flexion to form cylindrical  grasp.  Pt completed dressing with min assist for UB dressing and mod assist for LB dressing for dynamic standing balance and threading LLE.  Pt completed oral care while seated due to fatigue post shower.  Pt utilizing hemi-technique as learned in acute care OT.  Pt remained upright in w/c with RN arriving to administer meds.  ADL ADL Eating: Set up Where Assessed-Eating: Bed level Grooming: Setup Where Assessed-Grooming: Sitting at sink Upper Body Bathing: Supervision/safety Where Assessed-Upper Body Bathing: Shower Lower Body Bathing: Minimal assistance Where Assessed-Lower Body Bathing: Shower Upper Body Dressing: Minimal assistance Where Assessed-Upper Body Dressing: Chair Lower Body Dressing: Moderate assistance Where Assessed-Lower Body Dressing: Chair Toileting: Minimal assistance Where Assessed-Toileting: Toilet;Bedside Commode Toilet Transfer: Moderate assistance Toilet Transfer Method: Counselling psychologist: Geophysical data processor: Environmental education officer Method: Heritage manager: Transfer tub bench;Grab bars Mobility  Bed Mobility Bed Mobility: Rolling Right;Sit to Supine Rolling Right: Supervision/verbal cueing Sit to Supine: Supervision/Verbal cueing Transfers Sit to Stand: Minimal Assistance - Patient > 75% Stand to Sit: Contact  Guard/Touching assist   Discharge Criteria: Patient will be discharged from OT if patient refuses treatment 3 consecutive times without medical reason, if treatment goals not met, if there is a change in medical status, if patient makes no progress towards goals or if patient is discharged from hospital.  The above assessment, treatment plan, treatment alternatives and goals were discussed and mutually agreed upon: by patient  Simonne Come 10/14/2020, 12:25 PM

## 2020-10-15 LAB — CBC WITH DIFFERENTIAL/PLATELET
Abs Immature Granulocytes: 0.05 10*3/uL (ref 0.00–0.07)
Basophils Absolute: 0 10*3/uL (ref 0.0–0.1)
Basophils Relative: 0 %
Eosinophils Absolute: 0 10*3/uL (ref 0.0–0.5)
Eosinophils Relative: 0 %
HCT: 41.3 % (ref 36.0–46.0)
Hemoglobin: 13.3 g/dL (ref 12.0–15.0)
Immature Granulocytes: 0 %
Lymphocytes Relative: 18 %
Lymphs Abs: 2.1 10*3/uL (ref 0.7–4.0)
MCH: 28.8 pg (ref 26.0–34.0)
MCHC: 32.2 g/dL (ref 30.0–36.0)
MCV: 89.4 fL (ref 80.0–100.0)
Monocytes Absolute: 0.6 10*3/uL (ref 0.1–1.0)
Monocytes Relative: 5 %
Neutro Abs: 9 10*3/uL — ABNORMAL HIGH (ref 1.7–7.7)
Neutrophils Relative %: 77 %
Platelets: 304 10*3/uL (ref 150–400)
RBC: 4.62 MIL/uL (ref 3.87–5.11)
RDW: 14.7 % (ref 11.5–15.5)
WBC: 11.8 10*3/uL — ABNORMAL HIGH (ref 4.0–10.5)
nRBC: 0 % (ref 0.0–0.2)

## 2020-10-15 MED ORDER — EXERCISE FOR HEART AND HEALTH BOOK
Freq: Once | Status: AC
  Filled 2020-10-15: qty 1

## 2020-10-15 MED ORDER — TOPIRAMATE 25 MG PO TABS
50.0000 mg | ORAL_TABLET | Freq: Two times a day (BID) | ORAL | Status: DC
Start: 2020-10-15 — End: 2020-10-24
  Administered 2020-10-15 – 2020-10-24 (×18): 50 mg via ORAL
  Filled 2020-10-15 (×18): qty 2

## 2020-10-15 NOTE — Patient Care Conference (Signed)
Inpatient RehabilitationTeam Conference and Plan of Care Update Date: 10/15/2020   Time: 11:49 AM    Patient Name: Rebecca Singh      Medical Record Number: [id number]  Date of Birth: March 29, 1956 Sex: Female         Room/Bed: 4W19C/4W19C-01 Payor Info: Payor: HU[employer] MEDICARE / Plan: HU[employer] MEDICARE HMO / Product Type: *No Product type* /    Admit Date/Time:  10/13/2020  5:04 PM  Primary Diagnosis:  Right middle cerebral artery stroke [facility])  Hospital Problems: Principal Problem:   Right middle cerebral artery stroke [facility]) Active Problems:   Leukocytosis   Urge incontinence   Chronic systolic congestive heart failure [facility])   Vascular headache   Anxiety state    Expected Discharge Date: Expected Discharge Date: 10/24/20  Team Members Present: Physician leading conference: Dr. Maryla Morrow Care Coodinator Present: Chana Bode, RN, BSN, CRRN;Cecile Sheerer, LCSWA Nurse Present: Margot Ables, LPN PT Present: Raechel Chute, PT OT Present: Rosalio Loud, OT PPS Coordinator present : Fae Pippin, [place], PT     Current Status/Progress Goal Weekly Team Focus  Bowel/Bladder   Continent of B/B, LBM 10/14/2020  Remain continent  Assess Qshift and PRN   Swallow/Nutrition/ Hydration             ADL's   Min A transfers with RW, Mod A without AD, UB/LB bathing and UB dressing Min A, LB dressing Mod A.  Visual impairments evident, need to further assess.  Pt tearful and anxious - will benefit from TR and neuropsych  Mod I ADLS, Supevision IADLs  ADL retraining, vision, LUE NMR, motor planning, safety awareness   Mobility   bed mobility supervision, transfers without AD min/mod A, gait 3ft without AD mod A, 4 steps 2 rails heavy mod A, WC mobility 35ft min A  Mod I transfers, supervision gait and stair navigation  functional mobility/transfers, generalized strengthening, dynamic standing balance/coordination, ambulation, NMR, endurance   Communication              Safety/Cognition/ Behavioral Observations            Pain   HA 6 on scale of 0-10.  Pain <3  Assess Qshift and PRN   Skin   Skin intact  Maintain Skin integrity  Assess Q shift, and PRN     Discharge Planning:  Pt to be assessed. Per EMR, pt lives with family and pt to have 24/7care at d/c.   Team Discussion: Anxiety and tone impair progress. MD monitoring WBC, incontinence with PVRs and adjusted topamax for HA. Overall min assist for transfers/gait and able to manage 4 steps with mod assist. Patient on target to meet rehab goals: yes, mod I goals with supervision for gait  *See Care Plan and progress notes for long and short-term goals.   Revisions to Treatment Plan:   Teaching Needs: Transfers, toileting, medications,  Secondary risk factors,etc  Current Barriers to Discharge: Insurance coverage for DME and follow up  Possible Resolutions to Barriers:  Family education OP follow up DME: 3N1 and shower chair    Medical Summary Current Status: Left side hemiparesis secondary to acute ischemic right MCA infarction status post IR revascularization  Barriers to Discharge: Behavior;Medical stability;Other (comments)  Barriers to Discharge Comments: Headaches Possible Resolutions to Barriers/Weekly Focus: Therapies, follow labs - WBCs, PVRs ordered, optimize headaches meds   Continued Need for Acute Rehabilitation Level of Care: The patient requires daily medical management by a physician with specialized training in physical medicine and  rehabilitation for the following reasons: Direction of a multidisciplinary physical rehabilitation program to maximize functional independence : Yes Medical management of patient stability for increased activity during participation in an intensive rehabilitation regime.: Yes Analysis of laboratory values and/or radiology reports with any subsequent need for medication adjustment and/or medical intervention. : Yes   I attest that I was  present, lead the team conference, and concur with the assessment and plan of the team.   Chana Bode B 10/15/2020, 12:41 PM

## 2020-10-15 NOTE — Progress Notes (Signed)
Occupational Therapy Session Note  Patient Details  Name: Rebecca Singh MRN: [id number] Date of Birth: 05-20-1956  Today's Date: 10/15/2020 OT Individual Time: 1030-1100 OT Individual Time Calculation (min): 30 min    Short Term Goals: Week 1:  OT Short Term Goal 1 (Week 1): STG = LTGs due to ELOS  Skilled Therapeutic Interventions/Progress Updates:    Pt received in recliner and agreeable to therapy.  This session focused on LUE functional use. Asked pt to show me how high she can lift her arm and she lifted it to approximately 100 degrees and had difficulty fully extending elbow. Demonstrated to pt how to clasp hands for a self ROM exercise flexing and extending L arm and reaching it knee to knee.  Pt needed mod cues and physical A to get started.  As she started she demonstrated some rigidity in her movement patterns.  Obtained an UE ranger for AROM to focus on reach and elbow extension.  Pt stated she could not feel her arm moving and was unable to feel touch to her fingers but could to her wrist and elbow.  Even though she could lift her arm before, pt was not able to actively move arm without significant assistant. Increased tone in elbow.  Doffed UE ranger and then elbow became very spastic and flexed and unable to extend her arm.  Worked on gentle PROM and then her PT arrived. Explained to PT her variable tone.  Handoff to PT.   Therapy Documentation Precautions:  Precautions Precautions: None Precaution Comments: L hemi, L inattention Restrictions Weight Bearing Restrictions: No  Pain: no c/o pain   ADL: ADL Eating: Set up Where Assessed-Eating: Bed level Grooming: Setup Where Assessed-Grooming: Sitting at sink Upper Body Bathing: Supervision/safety Where Assessed-Upper Body Bathing: Shower Lower Body Bathing: Minimal assistance Where Assessed-Lower Body Bathing: Shower Upper Body Dressing: Minimal assistance Where Assessed-Upper Body Dressing: Chair Lower Body  Dressing: Moderate assistance Where Assessed-Lower Body Dressing: Chair Toileting: Minimal assistance Where Assessed-Toileting: Toilet,Bedside Commode Toilet Transfer: Moderate assistance Toilet Transfer Method: Proofreader: Psychiatric nurse: Insurance underwriter Method: Designer, industrial/product: Transfer tub bench,Grab bars   Therapy/Group: Individual Therapy  SAGUIER,JULIA 10/15/2020, 12:53 PM

## 2020-10-15 NOTE — Progress Notes (Signed)
Inpatient Rehabilitation Care Coordinator Assessment and Plan Patient Details  Name: Rebecca Singh MRN: [id number] Date of Birth: [birth year]  Today's Date: 10/15/2020  Hospital Problems: Principal Problem:   Right middle cerebral artery stroke [facility]) Active Problems:   Leukocytosis   Urge incontinence   Chronic systolic congestive heart failure [facility])   Vascular headache   Anxiety state  Past Medical History:  Past Medical History:  Diagnosis Date  . AICD (automatic cardioverter/defibrillator) present   . COPD (chronic obstructive pulmonary disease) ([place])   . Hypothyroidism   . Myocardial infarction ([place])   . Pacemaker/defibrillator   . Stroke [facility])    Past Surgical History:  Past Surgical History:  Procedure Laterality Date  . ABDOMINAL HYSTERECTOMY    . CHOLECYSTECTOMY    . INSERT / REPLACE / REMOVE PACEMAKER    . IR PERCUTANEOUS ART THROMBECTOMY/INFUSION INTRACRANIAL INC DIAG ANGIO  10/07/2020  . RADIOLOGY WITH ANESTHESIA N/A 10/07/2020   Procedure: IR WITH ANESTHESIA - CODE STROKE;  Surgeon: Radiologist, Medication, MD;  Location: [place];  Service: Radiology;  Laterality: N/A;  . THYROIDECTOMY    . TONSILLECTOMY     Social History:  reports that she has been smoking cigarettes. She has a 15.00 pack-year smoking history. She has never used smokeless tobacco. She reports that she does not drink alcohol and does not use drugs.  Family / Support Systems Marital Status: Widow/Widower Patient Roles: Parent Spouse/Significant Other: Widowed Children: 2 adult sons Aaron Edelman and Dorothea Ogle Other Supports: various family members that live in the home Anticipated Caregiver: see above Ability/Limitations of Caregiver: Pt has good family support. Caregiver Availability: 24/7 Family Dynamics: Pt lives in the home with various family members.Pt son Aaron Edelman and father 66 y.o) do not work and are home all day. Son Aaron Edelman takes her to all medical appointments. Patient has a sister, and  grandchildren (71 and 36) that also live in the home.  Social History Preferred language: English Religion: None Cultural Background: Pt worked in various blue collar roles. 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 Education: high school Read: Yes Write: Yes Employment Status: Employed Name of Employer: Engineer, agricultural of Employment: 2 (years) Return to Work Plans: Pt would like to return to work as soon as possible Public relations account executive Issues: Denies Guardian/Conservator: N/A   Abuse/Neglect Abuse/Neglect Assessment Can Be Completed: Yes Physical Abuse: Denies Verbal Abuse: Denies Sexual Abuse: Denies Exploitation of patient/patient's resources: Denies Self-Neglect: Denies  Emotional Status Pt's affect, behavior and adjustment status: Pt in good spirits at time of visit Recent Psychosocial Issues: Denies Psychiatric History: denies Substance Abuse History: Pt admits that she quit smoking cigarettes at time of admission- 2ppd for 40 years. Pt states she was a social drinker. States her issue was red bull in which she would have atleast 6-8 per day and 1-2 pots of coffee per day as well.  Patient / Family Perceptions, Expectations & Goals Pt/Family understanding of illness & functional limitations: Pt and family have a general understanding of care needs Premorbid pt/family roles/activities: Independent Anticipated changes in roles/activities/participation: some assistance with ADLs/IADLs Pt/family expectations/goals: Pt goes is to get herself together and be normal, more independent.  Community Resources Express Scripts: None Premorbid Home Care/DME Agencies: None Transportation available at discharge: family Resource referrals recommended: Neuropsychology  Discharge Planning Living Arrangements: Children,Other relatives Support Systems: Children,Other relatives Type of Residence:  Private residence Insurance Resources: Medicaid (  specify county),Private Insurance (specify) Personnel officer Medicare and [place]) Financial Resources: Employment,Family Support Financial Screen Referred: No Living Expenses: Own Money Management: Patient,Family Does the patient have any problems obtaining your medications?: No Home Management: All family members manage home care needs Patient/Family Preliminary Plans: No changes Care Coordinator Anticipated Follow Up Needs: HH/OP  Clinical Impression SW met with pt in room to introduce self, explain role, and discuss discharge process. Pt is not a English as a second language teacher. No HCPOA. DME: RW, 3in1 BSC and shower seat.    Lorren Rossetti A Ava Deguire 10/15/2020, 5:14 PM

## 2020-10-15 NOTE — Progress Notes (Addendum)
[place] PHYSICAL MEDICINE & REHABILITATION PROGRESS NOTE  Subjective/Complaints: Patient seen sitting up, working with therapy this morning.  He states he slept better overnight.  He states he continues to have headache.  He notes she had reflux yesterday as well.  ROS: + Headache.  Denies CP, SOB, N/V/D  Objective: Vital Signs: Blood pressure 120/78, pulse (!) 50, temperature (!) 97.5 F (36.4 C), resp. rate 18, height 5\' 8"  (1.727 m), weight 93.7 kg, SpO2 96 %. No results found. Recent Labs    10/14/20 0448 10/15/20 0538  WBC 12.7* 11.8*  HGB 13.9 13.3  HCT 41.4 41.3  PLT 291 304   Recent Labs    10/13/20 0152 10/14/20 0448  NA 138 136  K 4.2 4.3  CL 106 106  CO2 21* 21*  GLUCOSE 150* 181*  BUN 20 24*  CREATININE 0.96 0.98  CALCIUM 9.5 9.4    Intake/Output Summary (Last 24 hours) at 10/15/2020 1047 Last data filed at 10/15/2020 0700 Gross per 24 hour  Intake 150 ml  Output --  Net 150 ml        Physical Exam: BP 120/78 (BP Location: Left Arm)   Pulse (!) 50   Temp (!) 97.5 F (36.4 C)   Resp 18   Ht 5\' 8"  (1.727 m)   Wt 93.7 kg   SpO2 96%   BMI 31.41 kg/m  Constitutional: No distress . Vital signs reviewed.  Obese. HENT: Normocephalic.  Atraumatic. Eyes: EOMI. No discharge. Cardiovascular: No JVD.  RRR. Respiratory: Normal effort.  No stridor.  Bilateral clear to auscultation. GI: Non-distended.  BS +. Skin: Warm and dry.  Intact. Psych: Anxious.  Tearful. Musc: No edema in extremities.  No tenderness in extremities. Neuro: Alert Follows commands.  Left homonymous hemianopsia.  LUE: 4 -/5 proximal distal with apraxia, improving Left lower extremity: 4-4+/5 proximal and distal  Assessment/Plan: 1. Functional deficits which require 3+ hours per day of interdisciplinary therapy in a comprehensive inpatient rehab setting.  Physiatrist is providing close team supervision and 24 hour management of active medical problems listed  below.  Physiatrist and rehab team continue to assess barriers to discharge/monitor patient progress toward functional and medical goals   Care Tool:  Bathing    Body parts bathed by patient: Left arm,Chest,Abdomen,Front perineal area,Buttocks,Right upper leg,Left upper leg,Face,Right lower leg   Body parts bathed by helper: Right arm,Left lower leg     Bathing assist Assist Level: Minimal Assistance - Patient > 75%     Upper Body Dressing/Undressing Upper body dressing   What is the patient wearing?: Pull over shirt    Upper body assist Assist Level: Minimal Assistance - Patient > 75%    Lower Body Dressing/Undressing Lower body dressing      What is the patient wearing?: Pants     Lower body assist Assist for lower body dressing: Moderate Assistance - Patient 50 - 74%     Toileting Toileting    Toileting assist Assist for toileting: Minimal Assistance - Patient > 75%     Transfers Chair/bed transfer  Transfers assist     Chair/bed transfer assist level: Minimal Assistance - Patient > 75%     Locomotion Ambulation   Ambulation assist      Assist level: Minimal Assistance - Patient > 75% Assistive device: Walker-rolling Max distance: 28ft   Walk 10 feet activity   Assist     Assist level: Minimal Assistance - Patient > 75% Assistive device: Walker-rolling   Walk 50  feet activity   Assist    Assist level: Moderate Assistance - Patient - 50 - 74% Assistive device: No Device    Walk 150 feet activity   Assist Walk 150 feet activity did not occur: Safety/medical concerns (fatigue, L hemi, weakness, poor endurance)         Walk 10 feet on uneven surface  activity   Assist Walk 10 feet on uneven surfaces activity did not occur: Safety/medical concerns (fatigue, L hemi, weakness, poor endurance)         Wheelchair     Assist Will patient use wheelchair at discharge?: Yes Type of Wheelchair: Manual    Wheelchair assist  level: Minimal Assistance - Patient > 75% Max wheelchair distance: 81ft    Wheelchair 50 feet with 2 turns activity    Assist        Assist Level: Minimal Assistance - Patient > 75%   Wheelchair 150 feet activity     Assist      Assist Level: Maximal Assistance - Patient 25 - 49%    Medical Problem List and Plan: 1.Left side hemiparesis secondary to acute ischemic right MCA infarction status post IR revascularization  Continue CIR  Team conference today to discuss current and goals and coordination of care, home and environmental barriers, and discharge planning with nursing, case manager, and therapies. Please see conference note from today as well.  2. Antithrombotics: -DVT/anticoagulation:SCDs -antiplatelet therapy: Low-dose aspirin 81 mg daily initiated 10/13/2020 3. Pain Management/chronic back pain/vascular headache:  Topamax 25 mg twice daily, increased on 2/2  Fioricet as needed  Tylenol as needed 4. Mood:Provide emotional support  May require medication for anxiety -antipsychotic agents: N/A 5. Neuropsych: This patientiscapable of making decisions on herown behalf. 6. Skin/Wound Care:Routine skin checks 7. Fluids/Electrolytes/Nutrition:Routine in and outs. 8. COPD/tobacco abuse. NicoDerm patch.   Counsel 9. Nonischemic cardiomyopathy/CHF. Ejection fraction 35%. Follow-up cardiology services Filed Weights   10/13/20 1751 10/13/20 1801  Weight: 93.7 kg 93.7 kg   Daily weights 10. Urge incontinence: timed voids  PVRs ordered, pending 11.  Prediabetes  Hemoglobin A1c 6.1 on 09/2020  Monitor with increased mobility 12.  Leukocytosis  WBCs 11.8 on 2/2, improving  Afebrile, no signs/symptoms of infection  LOS: 2 days A FACE TO FACE EVALUATION WAS PERFORMED  Hank Walling Karis Juba 10/15/2020, 10:47 AM

## 2020-10-15 NOTE — Progress Notes (Signed)
Patient ID: Rebecca Singh, female   DOB: 1956-07-06, 65 y.o.   MRN: [id number]  This SW covering for assigned SW, Becky Dupree.   SW met with pt in the room to provide updates on progress made in rehab, and d/c da te 2/11. When discussing DME: 3in1 BSC and shower chair, pt reports she has this already.  Pt states the primary contact will be her sister Diane.Pt aware SW to follow-up with her sister Shauna Hugh.   *SW spoke with pt sister Shauna Hugh [phone]) to provide above updates. She also confirms pt has above DME. States pt will always have support from someone at the home. Preferred outpatient location is [place] she will follow-up with SW about when she and pt son Aaron Edelman can come in for family education.   Loralee Pacas, MSW, [place] Office: [phone] Cell: [phone] Fax: [phone]

## 2020-10-15 NOTE — Progress Notes (Signed)
Physical Therapy Session Note  Patient Details  Name: Rebecca Singh MRN: [id number] Date of Birth: 03-13-1956  Today's Date: 10/15/2020 PT Individual Time: 0800-0910 and 1101-1127 PT Individual Time Calculation (min): 70 min and 26 min  Short Term Goals: Week 1:  PT Short Term Goal 1 (Week 1): STG=LTG due to LOS  Skilled Therapeutic Interventions/Progress Updates:   Treatment Session 1: 0800-0910 70 min Received pt supine in bed, pt agreeable to therapy, and reported 5/10 headache pain (premedicated). Repositioning, rest breaks, and distraction done to reduce pain levels. Session with emphasis on functional mobility/transfers, toileting, generalized strengthening, dynamic standing balance/coordination, gait training, and improved activity tolerance. Pt requested to use restroom and performed bed mobility with supervision. Donned non-skid socks with total A sitting EOB and pt ambulated 15ft x 2 trials without AD and min handheld assist to/from bathroom. Pt able to manage clothes with CGA and void. Pt stood at sink and washed hands, brushed teeth, and washed face with close supervision. Pt transported to dayroom in [facility] total A for time management and energy conservation purposes. Pt stepped up onto LiteGait treadmill with min A with cues for stepping sequencing and donned LiteGait harness in standing with max A. Pt initially hesitant and not trusting LiteGait to hold her. Therefore, practiced "hanging" in harness to establish trust. Pt worked on gait training in [place] for the following time frames with LUE ace wrapped to handgrip:  -3 minutes 36 seconds at 0. for 30ft. Pt with significant bilateral foot drag requiring max cues to increase step length and for equal strides as pt's LLE dragging behind. Attempted to provide manual facilitation to assist LLE but pt reported it made her feel like she was going to fall.  -1 minute 51 seconds at 0. for 48ft with LLE ace wrapped for improved DF  foot clearance. Pt demonstrated mild improved L foot clearance but continued to demonstrate same shuffling gait pattern. Pt became overstimulated and overwhelmed with cues to increase step length from therapist and demonstrating Valsalva; cued pt for pursed lip breathing. Returned to sitting to rest.  MD present for morning rounds and pt stepped off LiteGait treadmill with min A. Pt ambulated 11ft with RW on level ground with min A with L DF ace wrap. Pt demonstrated mild improvements in LLE foot clearance but continued to demonstrate decreased bilateral foot clearance, decreased weight shifting to LLE, decreased stride length, and flexed trunk. Pt transported back to room in [facility] total A and ambulated additional 37ft without AD and min A to recliner. Concluded session with pt sitting in recliner, needs within reach, and chair pad alarm on.   Treatment Session 2: 1101-1127 26 min Received pt sitting in recliner with OT present discussing tone in LUE requesting PT to assess for clarification. Session with emphasis on functional mobility/transfers, generalized strengthening, dynamic standing balance/coordination, ambulation, and improved activity tolerance. OT stated that she noticed increased LUE flexor tone. Pt did demonstrate LUE flexor tone, however PT able to stretch LUE into elbow extension and maintain LUE extension with no spasticity noted. Pt ambulated 72ft without AD and min A demonstrating same shuffling gait pattern with decreased bilateral foot clearance. Pt did not demonstrate any increase in LUE flexor tone with gait. Pt performed the following exercises sitting in recliner with cues for technique: -L hip flexion x10 with improvements in hip flexion from yesterday! -L knee extension x10 (decreased ROM) -L ankle DF (AAROM) x10; pt with improvements in active ROM with increased repetitions Concluded session with  pt sitting in recliner, needs within reach, and chair pad alarm on.   Therapy  Documentation Precautions:  Precautions Precautions: None Precaution Comments: L hemi, L inattention Restrictions Weight Bearing Restrictions: No  Therapy/Group: Individual Therapy Martin Majestic PT, DPT   10/15/2020, 7:25 AM

## 2020-10-15 NOTE — Progress Notes (Signed)
Occupational Therapy Session Note  Patient Details  Name: Rebecca Singh MRN: [id number] Date of Birth: 07-Jun-1956  Today's Date: 10/15/2020 OT Individual Time: 1400-1500 OT Individual Time Calculation (min): 60 min    Short Term Goals: Week 1:  OT Short Term Goal 1 (Week 1): STG = LTGs due to ELOS  Skilled Therapeutic Interventions/Progress Updates:    Treatment session with focus on self-care retraining, functional transfers, and use of LUE during self-care tasks.  Pt received upright in recliner reporting events of the day. Pt reports increased tone in hand and arm during earlier OT session and concerns regarding tone.  Pt expressing desire to shower during session.  Pt ambulated to toilet with RW with min assist to guide RW due to decreased grasp and functional use of LUE to maneuver RW.  Pt demonstrating decreased attention to L body and L environment during mobility.  Pt voided on toilet and completed hygiene with lateral leans.  Pt ambulated without AD with min assist and use of grab bars to shower.  Pt completed bathing with lateral leans and sit > stand.  Therapist encouraged increased incorporation of LUE during bathing.  Pt with decreased palmar grasp this session, hand maintaining open despite attempts to close.  Later when dressing, pt able to obtain partial grasp with thumb and first 2 fingers to pull shirt over arm.  However then unable to release grasp requiring manual assist and cues for relaxation.  Therapist educated on hemi-dressing technique with pt able to complete pants with min assist for standing balance and to pull pants over L hip.  Pt ambulated back to sink min assist with RW.  Engaged in grooming tasks in sitting due to fatigue.  Pt remained upright at sink with chair alarm on and call bell in reach.  Therapy Documentation Precautions:  Precautions Precautions: None Precaution Comments: L hemi, L inattention Restrictions Weight Bearing Restrictions: No General:    Vital Signs: Therapy Vitals Temp: 97.7 F (36.5 C) Pulse Rate: (!) 48 Resp: 18 BP: 126/74 Patient Position (if appropriate): Sitting Oxygen Therapy SpO2: 100 % Pain:  Pt with no c/o pain   Therapy/Group: Individual Therapy  Mohamud Mrozek 10/15/2020, 3:00 PM

## 2020-10-15 NOTE — Progress Notes (Signed)
Patient ID: Rebecca Singh, female   DOB: [birth year], 65 y.o.   MRN: [id number] Met with the patient to review role of the nurse CM to address educational needs and collaboration with the SW to facilitate preparation for discharge. Reviewed secondary stroke risks including  HF and prediabetes and excluding HLD with LDL of 65/Trig 52. Patient given handouts on HF, zone management tool, prediabetes and discussed dietary modifications/carbohydrate counting to decrease A1C from 6.1. Patient is a "meat and potatoes" girl and reports DM in siblings and parent.  Discussed modifications and food choices to substitute for breads/carbs/starches. Continue to follow along to discharge to address educational needs. Margarito Liner

## 2020-10-16 DIAGNOSIS — I69354 Hemiplegia and hemiparesis following cerebral infarction affecting left non-dominant side: Principal | ICD-10-CM

## 2020-10-16 MED ORDER — TRAMADOL HCL 50 MG PO TABS
50.0000 mg | ORAL_TABLET | Freq: Four times a day (QID) | ORAL | Status: DC
  Administered 2020-10-16: 50 mg via ORAL
  Filled 2020-10-16: qty 1

## 2020-10-16 MED ORDER — TRAMADOL HCL 50 MG PO TABS
50.0000 mg | ORAL_TABLET | Freq: Once | ORAL | Status: AC
  Administered 2020-10-16: 50 mg via ORAL
  Filled 2020-10-16: qty 1

## 2020-10-16 NOTE — Care Management (Signed)
Inpatient Rehabilitation Center Individual Statement of Services  Patient Name:  SHANESHA BEDNARZ  Date:  10/16/2020  Welcome to the Inpatient Rehabilitation Center.  Our goal is to provide you with an individualized program based on your diagnosis and situation, designed to meet your specific needs.  With this comprehensive rehabilitation program, you will be expected to participate in at least 3 hours of rehabilitation therapies Monday-Friday, with modified therapy programming on the weekends.  Your rehabilitation program will include the following services:  Physical Therapy (PT), Occupational Therapy (OT), 24 hour per day rehabilitation nursing, Therapeutic Recreaction (TR), Psychology, Neuropsychology, Care Coordinator, Rehabilitation Medicine, Nutrition Services, Pharmacy Services and Other  Weekly team conferences will be held on Wendesdays to discuss your progress.  Your Inpatient Rehabilitation Care Coordinator will talk with you frequently to get your input and to update you on team discussions.  Team conferences with you and your family in attendance may also be held.  Expected length of stay: 7-10 days  Overall anticipated outcome: Independent with an Assistive Device  Depending on your progress and recovery, your program may change. Your Inpatient Rehabilitation Care Coordinator will coordinate services and will keep you informed of any changes. Your Inpatient Rehabilitation Care Coordinator's name and contact numbers are listed  below.  The following services may also be recommended but are not provided by the Inpatient Rehabilitation Center:   Driving Evaluations  Home Health Rehabiltiation Services  Outpatient Rehabilitation Services  Vocational Rehabilitation   Arrangements will be made to provide these services after discharge if needed.  Arrangements include referral to agencies that provide these services.  Your insurance has been verified to be:  [employer]  Your primary doctor is:  [employer]  Pertinent information will be shared with your doctor and your insurance company.  Inpatient Rehabilitation Care Coordinator:  Susie Cassette [phone] or ([phone]  Information discussed with and copy given to patient by: Gretchen Short, 10/16/2020, 12:14 PM

## 2020-10-16 NOTE — Progress Notes (Signed)
Occupational Therapy Session Note  Patient Details  Name: Rebecca Singh MRN: [id number] Date of Birth: 27-Jan-1956  Today's Date: 10/16/2020 OT Individual Time: 2094-7096 OT Individual Time Calculation (min): 15 min  and Today's Date: 10/16/2020 OT Missed Time: 15 Minutes Missed Time Reason: Pain   Short Term Goals: Week 1:  OT Short Term Goal 1 (Week 1): STG = LTGs due to ELOS      Skilled Therapeutic Interventions/Progress Updates:     Pt received in wc crying and holding her head and stated, "my head is killing me, I have such a bad headache." Because pt was in so much pain (she rated pain at 8/10) Had pt transfer to bed with min A. Informed nurse and she brought in medication for her. Provided pt with a cool washcloth for her forehead. Worked on PROM to LUE with no high tone and pt's muscles were very relaxed (compared to yesterday's session).  Pt continuing to cry, so ended session so she could relax. Pt in bed with alarm set and all needs met.  Therapy Documentation Precautions:  Precautions Precautions: None Precaution Comments: L hemi, L inattention Restrictions Weight Bearing Restrictions: No General: General OT Amount of Missed Time: 15 Minutes      Therapy/Group: Individual Therapy  [place]ak 10/16/2020, 12:08 PM

## 2020-10-16 NOTE — Progress Notes (Signed)
Received a call from nurse reporting EKG was completed, she stated the EKG was ordered by Jesusita Oka. This provider asked her to ask Rebecca Singh if she's having chest pain, she reports she's having chest pressure. Chart was reviewed, on 10/13/2020, Dr Cherly Beach seen Rebecca Singh, note was reviewed. Placed a call to Dr Terrace Arabia we reviewed her EKG. No new orders at this time, we will continue to monitor. If she develops chest pain again we will obtain Trops and new EKG per Dr Terrace Arabia recommendations.  This will be discussed with her nurse.

## 2020-10-16 NOTE — Progress Notes (Signed)
Physical Therapy Session Note  Patient Details  Name: Rebecca Singh MRN: [id number] Date of Birth: 12-Jan-1956  Today's Date: 10/16/2020 PT Individual Time: 0800-0845 PT Individual Time Calculation (min): 45 min   Short Term Goals: Week 1:  PT Short Term Goal 1 (Week 1): STG=LTG due to LOS Week 2:     Skilled Therapeutic Interventions/Progress Updates:    PAIN c/o headache last night but resolved this am.  Reports significant light and noise sensitivity.  Pt treated in day room in attempt to seek quiet environment, returned to room when noise level increased irritating pt.  Pt initially oob in wc and eager for session.  Pt performs wc propulsion x 20 using bilat feet w/tactile and verbal cues to increase use of LLE/attend to limb w/activity. DF assist applied to LLE.  Limited AROM/improved activation w/DF assist, but able to slowly propel chair. MD in to see pt during rest break. Pt transported to day room.  Repeated Sit to stand from wc w/set up assist for L hand placement, attention to LUE and LLE positioning in prep for task.  Much difficulty w/grasp and release of grasp w/alien hand like lack of control + rigiditiy to extent blanching occurs.  Repeated Sit to stand x 6 reps, paucity of movement w/task. Gait 24ft w/mod assist w/RW, very wide base, staggering gait, very short step length w/poor clearance bilat,  Second pt in gym yelling loudly and pt requested to return to room.   Pt transported to room.  Repeated Sit to stand activity as above.  Pt fatigued following 45 min session. Pt left oob in wc w/alarm belt set and needs in reach  Therapy Documentation Precautions:  Precautions Precautions: None Precaution Comments: L hemi, L inattention Restrictions Weight Bearing Restrictions: No   Therapy/Group: Individual Therapy  Evalette Montrose, PT   DEBRIA BROECKER 10/16/2020, 1:23 PM

## 2020-10-16 NOTE — Progress Notes (Signed)
Physical Therapy Session Note  Patient Details  Name: Rebecca Singh MRN: [id number] Date of Birth: 03-10-1956  Today's Date: 10/16/2020 PT Individual Time: 1300-1310 PT Individual Time Calculation (min): 10 min   Today's Date: 10/16/2020 PT Missed Time: 65 Minutes Missed Time Reason: Pain;Other (Comment) (headache)  Short Term Goals: Week 1:  PT Short Term Goal 1 (Week 1): STG=LTG due to LOS  Skilled Therapeutic Interventions/Progress Updates:   Received pt sidelying in bed in fetal position with covers over her head. Pt reported having severe headache since this morning with little improvement and having body chills. Pt reported receiving pain medication but stated her headache was still bad. Pt tearful when talking to therapist expressing motivation for wanting to participate in therapy stating "I even got my tennis shoes on by myself this morning." Therapist provided cool washcloth and ice pack for pt's head, covered pt with additional blanket, and closed blinds in room. Pt in bed with alarm set and all needs within reach. 65 minutes missed of skilled physical therapy due pain/headache.    Therapy Documentation Precautions:  Precautions Precautions: None Precaution Comments: L hemi, L inattention Restrictions Weight Bearing Restrictions: No  Therapy/Group: Individual Therapy Martin Majestic PT, DPT   10/16/2020, 7:31 AM

## 2020-10-16 NOTE — Progress Notes (Signed)
[place] PHYSICAL MEDICINE & REHABILITATION PROGRESS NOTE  Subjective/Complaints: Patient seen sitting up in a chair working with therapy this morning.  She states she slept well overnight, except for the fact that she woke up with a headache.  Discussed expectations with headaches with patient.  Attempted to call family as well, x4, no answer.  Discussed tone with therapies.  ROS: + Headache, improving.  Denies CP, SOB, N/V/D  Objective: Vital Signs: Blood pressure 123/73, pulse (!) 50, temperature 98.3 F (36.8 C), resp. rate 15, height 5\' 8"  (1.727 m), weight 93.7 kg, SpO2 95 %. No results found. Recent Labs    10/14/20 0448 10/15/20 0538  WBC 12.7* 11.8*  HGB 13.9 13.3  HCT 41.4 41.3  PLT 291 304   Recent Labs    10/14/20 0448  NA 136  K 4.3  CL 106  CO2 21*  GLUCOSE 181*  BUN 24*  CREATININE 0.98  CALCIUM 9.4    Intake/Output Summary (Last 24 hours) at 10/16/2020 1118 Last data filed at 10/16/2020 0700 Gross per 24 hour  Intake 360 ml  Output 1 ml  Net 359 ml        Physical Exam: BP 123/73 (BP Location: Left Arm)   Pulse (!) 50   Temp 98.3 F (36.8 C)   Resp 15   Ht 5\' 8"  (1.727 m)   Wt 93.7 kg   SpO2 95%   BMI 31.41 kg/m  Constitutional: No distress . Vital signs reviewed.  Obese. HENT: Normocephalic.  Atraumatic. Eyes: EOMI. No discharge. Cardiovascular: No JVD.  RRR. Respiratory: Normal effort.  No stridor.  Bilateral clear to auscultation. GI: Non-distended.  BS +. Skin: Warm and dry.  Intact. Psych: Flat.  Normal behavior. Musc: No edema in extremities.  No tenderness in extremities. Neuro: Alert Follows commands.  Left homonymous hemianopsia.  LUE: 4 -/5 proximal distal with apraxia, stable Left lower extremity: 4-4+/5 hip flexion knee extension, 3/5 ankle dorsiflexion  No increase in tone appreciated  Assessment/Plan: 1. Functional deficits which require 3+ hours per day of interdisciplinary therapy in a comprehensive inpatient rehab  setting.  Physiatrist is providing close team supervision and 24 hour management of active medical problems listed below.  Physiatrist and rehab team continue to assess barriers to discharge/monitor patient progress toward functional and medical goals   Care Tool:  Bathing    Body parts bathed by patient: Left arm,Chest,Abdomen,Front perineal area,Buttocks,Right upper leg,Left upper leg,Face,Right lower leg   Body parts bathed by helper: Right arm,Left lower leg     Bathing assist Assist Level: Minimal Assistance - Patient > 75%     Upper Body Dressing/Undressing Upper body dressing   What is the patient wearing?: Pull over shirt    Upper body assist Assist Level: Minimal Assistance - Patient > 75%    Lower Body Dressing/Undressing Lower body dressing      What is the patient wearing?: Underwear/pull up,Pants     Lower body assist Assist for lower body dressing: Minimal Assistance - Patient > 75%     Toileting Toileting    Toileting assist Assist for toileting: Minimal Assistance - Patient > 75%     Transfers Chair/bed transfer  Transfers assist     Chair/bed transfer assist level: Minimal Assistance - Patient > 75%     Locomotion Ambulation   Ambulation assist      Assist level: Minimal Assistance - Patient > 75% Assistive device: Walker-rolling Max distance: 23ft   Walk 10 feet activity  Assist     Assist level: Minimal Assistance - Patient > 75% Assistive device: Walker-rolling   Walk 50 feet activity   Assist    Assist level: Moderate Assistance - Patient - 50 - 74% Assistive device: No Device    Walk 150 feet activity   Assist Walk 150 feet activity did not occur: Safety/medical concerns (fatigue, L hemi, weakness, poor endurance)         Walk 10 feet on uneven surface  activity   Assist Walk 10 feet on uneven surfaces activity did not occur: Safety/medical concerns (fatigue, L hemi, weakness, poor endurance)          Wheelchair     Assist Will patient use wheelchair at discharge?: Yes Type of Wheelchair: Manual    Wheelchair assist level: Minimal Assistance - Patient > 75% Max wheelchair distance: 45ft    Wheelchair 50 feet with 2 turns activity    Assist        Assist Level: Minimal Assistance - Patient > 75%   Wheelchair 150 feet activity     Assist      Assist Level: Maximal Assistance - Patient 25 - 49%    Medical Problem List and Plan: 1.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 Antithrombotics: -DVT/anticoagulation:SCDs -antiplatelet therapy: Low-dose aspirin 81 mg daily initiated 10/13/2020 3. Pain Management/chronic back pain/vascular headache:  Topamax 25 mg twice daily, increased on 2/2  Fioricet as needed  Tylenol as needed  Appears to be improving overall 4. Mood:Provide emotional support  May require medication for anxiety -antipsychotic agents: N/A 5. Neuropsych: This patientiscapable of making decisions on herown behalf. 6. Skin/Wound Care:Routine skin checks 7. Fluids/Electrolytes/Nutrition:Routine in and outs. 8. COPD/tobacco abuse. NicoDerm patch.   Counsel 9. Nonischemic cardiomyopathy/CHF. Ejection fraction 35%. Follow-up cardiology services Filed Weights   10/13/20 1751 10/13/20 1801  Weight: 93.7 kg 93.7 kg   Daily weights, pending 10. Urge incontinence: timed voids  PVRs ordered, x1 performed, unremarkable  ?  Improving 11.  Prediabetes  Hemoglobin A1c 6.1 on 09/2020  Labs ordered for tomorrow  Monitor with increased mobility 12.  Leukocytosis  WBCs 11.8 on 2/2, labs ordered for tomorrow  Afebrile, no signs/symptoms of infection  LOS: 3 days A FACE TO FACE EVALUATION WAS PERFORMED  Zarielle Cea Karis Juba 10/16/2020, 11:18 AM

## 2020-10-16 NOTE — Progress Notes (Signed)
Patient ID: Rebecca Singh, female   DOB: 1956/07/04, 65 y.o.   MRN: [id number]  SW faxed outpatient PT/OT referral to [facility] (p:[phone]/f:[phone]).  Cecile Sheerer, MSW, LCSWA Office: [phone] Cell: [phone] Fax: [phone]

## 2020-10-16 NOTE — Discharge Instructions (Signed)
Inpatient Rehab Discharge Instructions  Rebecca Singh Discharge date and time: No discharge date for patient encounter.   Activities/Precautions/ Functional Status: Activity: activity as tolerated Diet: regular diet Wound Care: Routine skin checks Functional status:  ___ No restrictions     ___ Walk up steps independently ___ 24/7 supervision/assistance   ___ Walk up steps with assistance ___ Intermittent supervision/assistance  ___ Bathe/dress independently ___ Walk with walker     _x__ Bathe/dress with assistance ___ Walk Independently    ___ Shower independently ___ Walk with assistance    ___ Shower with assistance ___ No alcohol     ___ Return to work/school ________  COMMUNITY REFERRALS UPON DISCHARGE:     Outpatient: PT   OT             Agency: [facility]   Phone:[phone]              Appointment Date/Time:*Please expect follow-up within 7-10 business days to schedule your appointment. If you have not received follow-up, be sure to contact the site directly.   Medical Equipment/Items Ordered: 3in1 bedside commode and shower seat (has already)                                                 Agency/Supplier: N/A    Special Instructions: No driving smoking or alcohol STROKE/TIA DISCHARGE INSTRUCTIONS SMOKING Cigarette smoking nearly doubles your risk of having a stroke & is the single most alterable risk factor  If you smoke or have smoked in the last 12 months, you are advised to quit smoking for your health.  Most of the excess cardiovascular risk related to smoking disappears within a year of stopping.  Ask you doctor about anti-smoking medications  [place] Quit Line: 1-800-QUIT NOW  Free Smoking Cessation Classes [phone]  CHOLESTEROL Know your levels; limit fat & cholesterol in your diet  Lipid Panel     Component Value Date/Time   CHOL 124 10/08/2020 0443   TRIG 52 10/08/2020 0443   HDL 49 10/08/2020 0443   CHOLHDL 2.5 10/08/2020 0443    VLDL 10 10/08/2020 0443   LDLCALC 65 10/08/2020 0443      Many patients benefit from treatment even if their cholesterol is at goal.  Goal: Total Cholesterol (CHOL) less than 160  Goal:  Triglycerides (TRIG) less than 150  Goal:  HDL greater than 40  Goal:  LDL (LDLCALC) less than 100   BLOOD PRESSURE American Stroke Association blood pressure target is less that 120/80 mm/Hg  Your discharge blood pressure is:  BP: 135/77  Monitor your blood pressure  Limit your salt and alcohol intake  Many individuals will require more than one medication for high blood pressure  DIABETES (A1c is a blood sugar average for last 3 months) Goal HGBA1c is under 7% (HBGA1c is blood sugar average for last 3 months)  Diabetes: No known diagnosis of diabetes    Lab Results  Component Value Date   HGBA1C 6.1 (H) 10/08/2020     Your HGBA1c can be lowered with medications, healthy diet, and exercise.  Check your blood sugar as directed by your physician  Call your physician if you experience unexplained or low blood sugars.  PHYSICAL ACTIVITY/REHABILITATION Goal is 30 minutes at least 4 days per week  Activity: Increase activity slowly, Therapies: Physical Therapy: Home Health Return  to work:   Activity decreases your risk of heart attack and stroke and makes your heart stronger.  It helps control your weight and blood pressure; helps you relax and can improve your mood.  Participate in a regular exercise program.  Talk with your doctor about the best form of exercise for you (dancing, walking, swimming, cycling).  DIET/WEIGHT Goal is to maintain a healthy weight  Your discharge diet is:  Diet Order            Diet Heart Room service appropriate? Yes with Assist; Fluid consistency: Thin  Diet effective now                 liquids Your height is:  Height: 5\' 8"  (172.7 cm) Your current weight is: Weight: 93.7 kg Your Body Mass Index (BMI) is:  BMI (Calculated): 31.42  Following the  type of diet specifically designed for you will help prevent another stroke.  Your goal weight range is:    Your goal Body Mass Index (BMI) is 19-24.  Healthy food habits can help reduce 3 risk factors for stroke:  High cholesterol, hypertension, and excess weight.  RESOURCES Stroke/Support Group:  Call [phone]   STROKE EDUCATION PROVIDED/REVIEWED AND GIVEN TO PATIENT Stroke warning signs and symptoms How to activate emergency medical system (call 911). Medications prescribed at discharge. Need for follow-up after discharge. Personal risk factors for stroke. Pneumonia vaccine given:  Flu vaccine given:  My questions have been answered, the writing is legible, and I understand these instructions.  I will adhere to these goals & educational materials that have been provided to me after my discharge from the hospital.      My questions have been answered and I understand these instructions. I will adhere to these goals and the provided educational materials after my discharge from the hospital.  Patient/Caregiver Signature _______________________________ Date __________  Clinician Signature _______________________________________ Date __________  Please bring this form and your medication list with you to all your follow-up doctor's appointments.

## 2020-10-16 NOTE — Progress Notes (Signed)
Recreational Therapy Session Note  Patient Details  Name: LAELIA ANGELO MRN: [id number] Date of Birth: 09/18/1955 Today's Date: 10/16/2020  Pain: c/o of severe HA, pt stated RN aware and that she has received medication, declines further needs Skilled Therapeutic Interventions/Progress Updates: Pt in bed with head covered and crying, c/o HA.  TR eval deferred at this time.  Will attempt eval completion at a later date.    Baley Lorimer 10/16/2020, 10:11 AM

## 2020-10-16 NOTE — Progress Notes (Signed)
Occupational Therapy Session Note  Patient Details  Name: Rebecca Singh MRN: [id number] Date of Birth: 12/28/1955  Today's Date: 10/16/2020 OT Individual Time: 0174-9449 OT Individual Time Calculation (min): 20 min  Attempted to see patient at scheduled time 1030 but she declined due to head ache, chills, nausea and fatigue.   Second attempt made at 64 - she continues with all symptoms above but notes that she needs use the bathroom and tolerated 20 minutes of activity - missed 55 minutes of scheduled therapy time.    Short Term Goals: Week 1:  OT Short Term Goal 1 (Week 1): STG = LTGs due to ELOS  Skilled Therapeutic Interventions/Progress Updates:    at second attempt to complete therapy session - patient notes ongoing HA but needs to use the bathroom and unsure if she had an accident.  Supine to sitting edge of bed with CGA.  Ambulation with RW to/from bed and toilet with min A and cues for direction.  Small bowel accident - max A to complete hygiene and change clothing at this time due to not feeling well.  Hand hygiene completed while seated.  She returned to bed requiring min A for legs into bed.  She was alert but tearful t/o session.  Nursing aware.  She remained in bed at close of session, bed alarm set and call bell in hand.    Therapy Documentation Precautions:  Precautions Precautions: None Precaution Comments: L hemi, L inattention Restrictions Weight Bearing Restrictions: No   Therapy/Group: Individual Therapy  Barrie Lyme 10/16/2020, 7:36 AM

## 2020-10-16 NOTE — Progress Notes (Signed)
Patient complaining of severe headache with all PRNs being given. Patient stating she is "freezing" and asks for more blankets. Charge nurse came to assess patient and vitals all WNL. Dan, PA notified of situation and one time order of tramadol ordered and given. Will continue plan of care.

## 2020-10-16 NOTE — Evaluation (Signed)
Recreational Therapy Assessment and Plan  Patient Details  Name: Rebecca Singh MRN: [id number] Date of Birth: 11/27/1955 Today's Date: 10/16/2020  Rehab Potential:   ELOS:     Assessment    Hospital Problem: Principal Problem:   Right middle cerebral artery stroke [facility]) Active Problems:   Leukocytosis   Urge incontinence   Chronic systolic congestive heart failure ([place])   Vascular headache   Anxiety state   Past Medical History:      Past Medical History:  Diagnosis Date  . AICD (automatic cardioverter/defibrillator) present   . COPD (chronic obstructive pulmonary disease) ([place])   . Hypothyroidism   . Myocardial infarction ([place])   . Pacemaker/defibrillator   . Stroke [facility])    Past Surgical History:       Past Surgical History:  Procedure Laterality Date  . ABDOMINAL HYSTERECTOMY    . CHOLECYSTECTOMY    . INSERT / REPLACE / REMOVE PACEMAKER    . IR PERCUTANEOUS ART THROMBECTOMY/INFUSION INTRACRANIAL INC DIAG ANGIO  10/07/2020  . RADIOLOGY WITH ANESTHESIA N/A 10/07/2020   Procedure: IR WITH ANESTHESIA - CODE STROKE;  Surgeon: Radiologist, Medication, MD;  Location: [place];  Service: Radiology;  Laterality: N/A;  . THYROIDECTOMY    . TONSILLECTOMY      Assessment & Plan Clinical Impression: Patient is a 65 y.o. 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 Per chart review patient lives with son and grandchildren sister and elderly father. Independent with assistive device. Mobile home 3 steps to entry. She presented 10/07/2020 with acute onset of left-sided weakness. Noted blood pressure in the190's. Admission chemistries unremarkable except glucose 109 creatinine 1.03, SARS coronavirus negative, troponin XX 4  urine drug screen negative. CT of the head showed hypodensity right frontal parietal lobe suspicious for acute infarction in addition there was a probable small hyperdense vessel in the right sylvian fissure involving the right MCA. CT angiogram of head and neck right M2/MCA posterior division branch occlusion with moderate distal branch opacification. No hemodynamically significant stenosis in the neck. Patient underwent revascularization per interventional radiology. Follow-up CT of the head showed small volumehyper densitywithin the right sylvian fissure reflectingextravasatedcontrast and/or subarachnoid hemorrhage. Possible subtle acute infarct changes in the right frontal parietal lobe. Echocardiogram with ejection fraction of 30 to 60% grade 1 diastolic dysfunction. There was consideration of aspirin therapy currently on no antithrombotic due to small SAH awaiting plan for aspirin therapy with follow-up CT of the head completed 10/13/2020 showing continued interval evolution of patchy right MCA distribution infarct stable in size as compared to previous exam. No evidence for hemorrhagic transformation or significant regional mass-effect. Persistent but decreasedhyper densitywithin the right sylvian fissure likely a small amount of subarachnoid hemorrhage. No other acute abnormality.. Cardiology services consulted for both ICD interrogation for A. fib as well as nonspecific chest discomfort. EKG completed similar to distant prior EKG's.BIVrhythm 70s.. EKG consistent with myopericarditis for which she takes high-dosenonsteroidal'sand colchicine in the past and currently completing a course of Solu-Medrol. Troponin was completed 24-17-20-22. Chest discomfort felt to be more pleuritic in nature. Cardiology did sign off no further work-up currently indicated. She is tolerating a regular consistency diet.  Pt presents with decreased activity tolerance, decreased functional mobility, decreased balance,  decreased coordination Limiting pt's independence with leisure/community pursuits.   Plan  Min 1  TR session >20 minutes per week during LOS  Recommendations for other services: Neuropsych  Discharge Criteria: Patient will be discharged from TR if patient refuses treatment 3 consecutive times without medical reason.  If treatment goals not met, if there is a change in medical status, if patient makes no progress towards goals or if patient is discharged from hospital.  The above assessment, treatment plan, treatment alternatives and goals were discussed and mutually agreed upon: by patient  Ragan 10/16/2020, 8:47 AM

## 2020-10-17 ENCOUNTER — Encounter (HOSPITAL_COMMUNITY): Payer: Self-pay

## 2020-10-17 DIAGNOSIS — N179 Acute kidney failure, unspecified: Secondary | ICD-10-CM

## 2020-10-17 LAB — BASIC METABOLIC PANEL
Anion gap: 9 (ref 5–15)
BUN: 32 mg/dL — ABNORMAL HIGH (ref 8–23)
CO2: 18 mmol/L — ABNORMAL LOW (ref 22–32)
Calcium: 9 mg/dL (ref 8.9–10.3)
Chloride: 111 mmol/L (ref 98–111)
Creatinine, Ser: 1.14 mg/dL — ABNORMAL HIGH (ref 0.44–1.00)
GFR, Estimated: 53 mL/min — ABNORMAL LOW (ref >60)
Glucose, Bld: 98 mg/dL (ref 70–99)
Potassium: 3.9 mmol/L (ref 3.5–5.1)
Sodium: 138 mmol/L (ref 135–145)

## 2020-10-17 LAB — CBC WITH DIFFERENTIAL/PLATELET
Abs Immature Granulocytes: 0.02 10*3/uL (ref 0.00–0.07)
Basophils Absolute: 0 10*3/uL (ref 0.0–0.1)
Basophils Relative: 1 %
Eosinophils Absolute: 0.1 10*3/uL (ref 0.0–0.5)
Eosinophils Relative: 2 %
HCT: 44 % (ref 36.0–46.0)
Hemoglobin: 14.1 g/dL (ref 12.0–15.0)
Immature Granulocytes: 0 %
Lymphocytes Relative: 44 %
Lymphs Abs: 2.5 10*3/uL (ref 0.7–4.0)
MCH: 28.4 pg (ref 26.0–34.0)
MCHC: 32 g/dL (ref 30.0–36.0)
MCV: 88.7 fL (ref 80.0–100.0)
Monocytes Absolute: 0.4 10*3/uL (ref 0.1–1.0)
Monocytes Relative: 7 %
Neutro Abs: 2.6 10*3/uL (ref 1.7–7.7)
Neutrophils Relative %: 46 %
Platelets: 279 10*3/uL (ref 150–400)
RBC: 4.96 MIL/uL (ref 3.87–5.11)
RDW: 14.8 % (ref 11.5–15.5)
WBC: 5.6 10*3/uL (ref 4.0–10.5)
nRBC: 0 % (ref 0.0–0.2)

## 2020-10-17 MED ORDER — FLUOXETINE HCL 10 MG PO CAPS
10.0000 mg | ORAL_CAPSULE | Freq: Every day | ORAL | Status: DC
  Administered 2020-10-17 – 2020-10-19 (×3): 10 mg via ORAL
  Filled 2020-10-17 (×3): qty 1

## 2020-10-17 MED ORDER — AMITRIPTYLINE HCL 10 MG PO TABS
10.0000 mg | ORAL_TABLET | Freq: Every day | ORAL | Status: DC
  Administered 2020-10-17 – 2020-10-20 (×4): 10 mg via ORAL
  Filled 2020-10-17 (×4): qty 1

## 2020-10-17 NOTE — Progress Notes (Signed)
Physical Therapy Session Note  Patient Details  Name: Rebecca Singh MRN: [id number] Date of Birth: 07-Sep-1956  Today's Date: 10/17/2020 PT Individual Time: 1300-1345 PT Individual Time Calculation (min): 45 min   Short Term Goals: Week 1:  PT Short Term Goal 1 (Week 1): STG=LTG due to LOS Week 2:    Week 3:     Skilled Therapeutic Interventions/Progress Updates:    PAIN States headache Is much better than yesterday, rates 4-5/10.  Treatment to tolerance, low stim environment.  P initially supine and awaiting therapist.  Supine to sit w/supervision.  Sit to stand w/bed elevated w/cga, cues for placement of L hand on grip but improved ability to grasp noted.   Gait 75ft/commode transfer w/cga including negotiation of transition at door and cues for hand placement w/transfer, cues for grasp and release which requires additional time, min assist for clothing management,  continent of urine and performs hygiene independently.  Gt 54ft as above to sink w/RW.  Stands at sink w/cga and washes hands w/cues to attend to L hand.  Brushes teeth w/setup.  Transported to gym, able to open and close L hand w/additional time and cues to attend to task. Impaired force modulation w/lowering arm from walker.  Functional gait 39ft + straight path gait 12ft w/RW and min assist, cues to attend to L side of walker/LLE relationship. Gait wide based, staggering quality w/short step length, increased impact force, poor clearance.  Standing w/RW attempted stepping/tapping 3in step, poor force modulation w/task, impaired motor planning.  Repeated x 5 w/mod assist for balance, step by step commands needed for planning.    Pt left oob in wc w/alarm belt set and needs in reach   Therapy Documentation Precautions:  Precautions Precautions: None Precaution Comments: L hemi, L inattention Restrictions Weight Bearing Restrictions: No    Therapy/Group: Individual Therapy  Cerra Eisenhower,  PT   Rebecca Singh 10/17/2020, 4:06 PM

## 2020-10-17 NOTE — Consult Note (Signed)
Neuropsychological Consultation   Patient:   Rebecca Singh   DOB:   1956/06/26  MR Number:  [id number]  Location:  [facility] [address] CENTER A [id number] [place] [address] Dept: [phone] Loc: [phone]           Date of Service:   10/17/2020  Start Time:   9:30 AM End Time:   10:30 AM  Provider/Observer:  Arley Phenix, Psy.D.       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  Patient presented on 10/07/2020 with acute onset of left-sided weakness.  Elevated blood pressure in the 190s.  CT/MRI suggested hypodensity in right frontal parietal lobe suspicious for acute infarction involving right MCA.  CT angiogram of head neck right M2/MCA posterior division branch occlusion with moderate distal branch opacification.  Patient underwent revascularization per IR.  Follow-up CT of head showed small volume hyperdensity within the right sylvian fissure reflecting extravascular contrast and/or subarachnoid hemorrhage.  Possible subtle acute infarct changes in the right frontal/parietal lobe.  Reason for Service:  Patient was referred for neuropsychological consultation due to anxiety and coping issues in a setting of extended hospital stay due to right MCA infarction.  Below is the HPI for the current admission.  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 Per chart review patient lives with son and grandchildren sister and elderly father. Independent with assistive device. Mobile home 3 steps to entry. She presented 10/07/2020 with acute onset of left-sided weakness. Noted blood pressure in the 190s. Admission chemistries unremarkable except glucose 109 creatinine 1.03, SARS coronavirus negative, troponin XX 4 urine drug screen negative. CT of the head showed hypodensity right frontal parietal lobe suspicious for acute infarction in addition there was a probable small hyperdense vessel in the right sylvian fissure involving the right MCA. CT angiogram of head and neck right M2/MCA posterior division branch occlusion with moderate distal branch opacification. No hemodynamically significant stenosis in the neck. Patient underwent revascularization per interventional radiology. Follow-up CT of the head showed small volume hyperdensity within the right sylvian fissure reflectingextravasatedcontrast and/or subarachnoid hemorrhage. Possible subtle acute infarct changes in the right frontal parietal lobe. Echocardiogram with ejection fraction of 30 to 35% grade 1 diastolic dysfunction. Follow-up cranial CT scan 10/13/2020 showed continued interval evolution of patchy right MCA distribution infarct stable in size and distribution as compared to previous exam. No evidence of hemorrhagic transformation or significant mass-effect. Persistent but decreased hyperdensity within the right sylvian fissure likely a small amount of subarachnoid hemorrhage. Patient was cleared to begin low-dose aspirin for CVA prophylaxis. Cardiology services consulted for both ICD interrogation for A. fib as well as nonspecific chest discomfort. EKG completed similar to distant prior EKGs.biVrhythm 70s.. EKG consistent with myopericarditis for which she takes high-dose nonsteroidals and colchicine in the past and currently completing a course of Solu-Medrol. Troponin was completed  24-17-20-22. Chest discomfort felt to be more pleuritic in nature. Cardiology did sign off no further work-up currently indicated.  She is tolerating a regular consistency diet. Due to patient's left side weakness and decrease in functional ability physical medicine rehab consult requested and patient was admitted for a comprehensive rehab program.  Current Status:  Upon entering the room the patient was awake and alert sitting in her wheelchair with the lights dimmed.  Patient acknowledged anxiety.  Slowed information processing speed and confusion were noted.  Patient knowledges being aware of difficulty with comprehension and being easily confused particularly as information levels increase.  The patient reports that she can focus on one thing at a time but had difficulty with any type of multi  processing.  Speech was slow but articulate and lethargy was noted.  Patient acknowledged that there were times when her motivation and headache kept her from fully participating in therapies but understood the need and benefit of therapeutic involvement and was motivated to continue with the therapeutic process.  She reports that she was having severe headaches and that she was told it was due to some of the hemorrhagic effects that were part of her stroke event.  Patient denied significant prior history of headaches other than inconsistent mild headaches at times.  Patient described a stroke she had while she was pregnant and reports that she returned back to baseline quite quickly.  This was more than 20 years ago.  Patient questioned why she had the stroke and it could be related to changing the batteries on her pacemaker but it was pointed out that there are other variables including the very reason why she had a pacemaker and significant elevations in blood pressure around the time of her stroke.  Behavioral Observation: [name]  presents as a 65 y.o.-year-old Right Caucasian Female who appeared her  stated age. her dress was Appropriate and she was Well Groomed and her manners were Appropriate to the situation.  her participation was indicative of Appropriate, Inattentive and Redirectable behaviors.  There were physical disabilities noted.  she displayed an appropriate level of cooperation and motivation.     Interactions:    Active Inattentive and Slowed information processing speeds and difficulty with comprehension.  Attention:   abnormal and attention span appeared shorter than expected for age  Memory:   abnormal; remote memory intact, recent memory impaired  Visuo-spatial:  not examined  Speech (Volume):  low  Speech:   normal; slowed response pattern  Thought Process:  Tangential  Though Content:  WNL; not suicidal and not homicidal  Orientation:   person, place, time/date and situation  Judgment:   Fair  Planning:   Poor  Affect:    Anxious  Mood:    Anxious  Insight:   Fair  Intelligence:   normal  Medical History:   Past Medical History:  Diagnosis Date  . AICD (automatic cardioverter/defibrillator) present   . COPD (chronic obstructive pulmonary disease) (HCC)   . Hypothyroidism   . Myocardial infarction (HCC)   . Pacemaker/defibrillator   . Stroke [facility])          Patient Active Problem List   Diagnosis Date Noted  . Hemiparesis affecting left side as late effect of stroke (HCC)   . Leukocytosis   . Urge incontinence   . Chronic systolic congestive heart failure (HCC)   . Vascular headache   . Anxiety state   . 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 Acute myopericarditis   . SAH (subarachnoid hemorrhage) (HCC)   . Tobacco abuse   . Chronic pain syndrome   .  Prediabetes   . Nonischemic cardiomyopathy (HCC)   . AICD (automatic cardioverter/defibrillator) present   . Acute ischemic right MCA stroke (HCC) 10/07/2020  . Middle cerebral artery embolism, right 10/07/2020  . Chest pain 11/16/2017    Psychiatric  History:  Patient has a prior psychiatric history related to significant anxiety as well as polysubstance abuse in the past as well as chronic pain syndrome.  Family Med/Psych History: History reviewed. No pertinent family history.   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  Patient presented on 10/07/2020 with acute onset of left-sided weakness.  Elevated blood pressure in the 190s.  CT/MRI suggested hypodensity in right frontal parietal lobe suspicious for acute infarction involving right MCA.  CT angiogram of head neck right M2/MCA posterior division branch occlusion with moderate distal branch opacification.  Patient underwent revascularization per IR.  Follow-up CT of head showed small volume hyperdensity within the right sylvian fissure reflecting extravascular contrast and/or subarachnoid hemorrhage.  Possible subtle acute infarct changes in the right frontal/parietal lobe.  Upon entering the room the patient was awake and alert sitting in her wheelchair with the lights dimmed.  Patient acknowledged anxiety.  Slowed information processing speed and confusion were noted.  Patient knowledges being aware of difficulty with comprehension and being easily confused particularly as information levels increase.  The patient reports that she can focus on one thing at a time but had difficulty with any type of multi  processing.  Speech was slow but articulate and lethargy was noted.  Patient acknowledged that there were times when her motivation and headache kept her from fully participating in therapies but understood the need and benefit of therapeutic involvement and was motivated to continue with the therapeutic process.  She reports that she was having severe headaches and that she was told  it was due to some of the hemorrhagic effects that were part of her stroke event.  Patient denied significant prior history of headaches other than inconsistent mild headaches at times.  Patient described a stroke she had while she was pregnant and reports that she returned back to baseline quite quickly.  This was more than 20 years ago.  Patient questioned why she had the stroke and it could be related to changing the batteries on her pacemaker but it was pointed out that there are other variables including the very reason why she had a pacemaker and significant elevations in blood pressure around the time of her stroke.  Disposition/Plan:  Today we worked on coping and adjustment issues and addressed issues around her anxiety state, which is not new to her but has been exacerbated by her recent stroke and reduction in functional capacity.  Patient acknowledges weakness and slowed physical response style for that her left side as well as slowed information processing speeds and difficulty with confusion.  Can only handle a limited amount of information at one time and has significant difficulty with even basic information processing.  Diagnosis:    Right middle cerebral artery stroke [facility]) - Plan: Ambulatory referral to Neurology         Electronically Signed   _______________________ Arley Phenix, Psy.D. Clinical Neuropsychologist

## 2020-10-17 NOTE — Progress Notes (Signed)
Physical Therapy Session Note  Patient Details  Name: Rebecca Singh MRN: [id number] Date of Birth: 1956-02-04  Today's Date: 10/17/2020 PT Individual Time: 0901-0930 PT Individual Time Calculation (min): 29 min   Short Term Goals: Week 1:  PT Short Term Goal 1 (Week 1): STG=LTG due to LOS      Skilled Therapeutic Interventions/Progress Updates:    pt received sitting on toilet with NCT and agreeable to therapy. PT and recreational therapist present for session. Pt reported feeling much better this AM and verbalized she was light sensitive and noise sensitive which both worsen her headaches. Pt taken to ortho gym in [facility], total A for time to allow more quiet and softer lit area. Pt directed in gait training with Rolling walker for 77' with multiple turns at min A with VC for L foot to remain inside walker, trunk extension, and attention to task. Pt Sit to stand throughout session min A. Pt returned to room, total A in WC, left in WC, alarm set, All needs in reach and in good condition. Call light in hand.  Pt denied headache or other pain at this time.   Therapy Documentation Precautions:  Precautions Precautions: None Precaution Comments: L hemi, L inattention Restrictions Weight Bearing Restrictions: No General:   Vital Signs:  Pain: Pain Assessment Pain Scale: 0-10 Pain Score: 5  Pain Type: Acute pain Pain Location: Head Pain Frequency: Constant Pain Onset: On-going Pain Intervention(s): Medication (See eMAR);Repositioned Mobility:   Locomotion :    Trunk/Postural Assessment :    Balance:   Exercises:   Other Treatments:      Therapy/Group: Individual Therapy  Barbaraann Faster 10/17/2020, 1:05 PM

## 2020-10-17 NOTE — Progress Notes (Addendum)
[place] PHYSICAL MEDICINE & REHABILITATION PROGRESS NOTE  Subjective/Complaints: Patient seen sitting up in a chair this morning, finishing working with therapies.  She states she did not sleep well overnight.  She states she has headaches and loose stools.  She also notes that she is not eating or drinking-discussed importance of drinking as well as dehydration contributing to headaches.  She also had chest pain overnight and ECG was ordered-reviewed with Cards-no changes.  ROS: + Headaches, loose stools.  Denies CP, SOB, N/V  Objective: Vital Signs: Blood pressure 107/70, pulse (!) 52, temperature 97.7 F (36.5 C), temperature source Oral, resp. rate 16, height 5\' 8"  (1.727 m), weight 93.7 kg, SpO2 94 %. No results found. Recent Labs    10/15/20 0538 10/17/20 0607  WBC 11.8* 5.6  HGB 13.3 14.1  HCT 41.3 44.0  PLT 304 279   Recent Labs    10/17/20 0607  NA 138  K 3.9  CL 111  CO2 18*  GLUCOSE 98  BUN 32*  CREATININE 1.14*  CALCIUM 9.0    Intake/Output Summary (Last 24 hours) at 10/17/2020 1306 Last data filed at 10/17/2020 0854 Gross per 24 hour  Intake 0 ml  Output --  Net 0 ml        Physical Exam: BP 107/70 (BP Location: Left Wrist)   Pulse (!) 52   Temp 97.7 F (36.5 C) (Oral)   Resp 16   Ht 5\' 8"  (1.727 m)   Wt 93.7 kg   SpO2 94%   BMI 31.41 kg/m  Constitutional: No distress . Vital signs reviewed.  Obese. HENT: Normocephalic.  Atraumatic. Eyes: EOMI. No discharge. Cardiovascular: No JVD.  RRR. Respiratory: Normal effort.  No stridor.  Bilateral clear to auscultation. GI: Non-distended.  BS +. Skin: Warm and dry.  Intact. Psych: Flat.  Anxious. Musc: No edema in extremities.  No tenderness in extremities. Neuro: Alert Follows commands.  Left homonymous hemianopsia.  LUE: 4 -/5 proximal distal with apraxia, unchanged Left lower extremity: 4-4+/5 hip flexion knee extension, 3/5 ankle dorsiflexion, unchanged No increase in tone  appreciated  Assessment/Plan: 1. Functional deficits which require 3+ hours per day of interdisciplinary therapy in a comprehensive inpatient rehab setting.  Physiatrist is providing close team supervision and 24 hour management of active medical problems listed below.  Physiatrist and rehab team continue to assess barriers to discharge/monitor patient progress toward functional and medical goals   Care Tool:  Bathing    Body parts bathed by patient: Face   Body parts bathed by helper: Right arm,Left lower leg     Bathing assist Assist Level: Supervision/Verbal cueing     Upper Body Dressing/Undressing Upper body dressing   What is the patient wearing?: Pull over shirt    Upper body assist Assist Level: Minimal Assistance - Patient > 75%    Lower Body Dressing/Undressing Lower body dressing      What is the patient wearing?: Pants,Incontinence brief     Lower body assist Assist for lower body dressing: Maximal Assistance - Patient 25 - 49%     Toileting Toileting    Toileting assist Assist for toileting: Moderate Assistance - Patient 50 - 74%     Transfers Chair/bed transfer  Transfers assist     Chair/bed transfer assist level: Minimal Assistance - Patient > 75%     Locomotion Ambulation   Ambulation assist      Assist level: Minimal Assistance - Patient > 75% Assistive device: Walker-rolling Max distance: 58ft  Walk 10 feet activity   Assist     Assist level: Minimal Assistance - Patient > 75% Assistive device: Walker-rolling   Walk 50 feet activity   Assist    Assist level: Moderate Assistance - Patient - 50 - 74% Assistive device: No Device    Walk 150 feet activity   Assist Walk 150 feet activity did not occur: Safety/medical concerns (fatigue, L hemi, weakness, poor endurance)         Walk 10 feet on uneven surface  activity   Assist Walk 10 feet on uneven surfaces activity did not occur: Safety/medical concerns  (fatigue, L hemi, weakness, poor endurance)         Wheelchair     Assist Will patient use wheelchair at discharge?: Yes Type of Wheelchair: Manual    Wheelchair assist level: Minimal Assistance - Patient > 75% Max wheelchair distance: 51ft    Wheelchair 50 feet with 2 turns activity    Assist        Assist Level: Minimal Assistance - Patient > 75%   Wheelchair 150 feet activity     Assist      Assist Level: Maximal Assistance - Patient 25 - 49%    Medical Problem List and Plan: 1.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 Antithrombotics: -DVT/anticoagulation:SCDs -antiplatelet therapy: Low-dose aspirin 81 mg daily initiated 10/13/2020 3. Pain Management/chronic back pain/vascular headache:  Topamax 25 mg twice daily, increased on 2/2  Fioricet as needed  Tylenol as needed  Heart rate unable to tolerate propanolol  Elavil 10 started on 2/4  See #13 4. Mood:Provide emotional support  Fluoxetine started on 2/4 -antipsychotic agents: N/A 5. Neuropsych: This patientiscapable of making decisions on herown behalf.  Appreciate neuropsych eval-discussed mood with neuropsych 6. Skin/Wound Care:Routine skin checks 7. Fluids/Electrolytes/Nutrition:Routine in and outs. 8. COPD/tobacco abuse. NicoDerm patch.   Counsel 9.  Chronic nonischemic cardiomyopathy/systolic CHF. Ejection fraction 35%. Follow-up cardiology services Filed Weights   10/13/20 1751 10/13/20 1801  Weight: 93.7 kg 93.7 kg   Status post pacemaker  Daily weights, remain pending 10. Urge incontinence: timed voids  PVRs ordered, x1 performed, unremarkable  ?  Improving 11.  Prediabetes  Hemoglobin A1c 6.1 on 09/2020  Controlled on 2/4  Monitor with increased mobility 12.  Leukocytosis: Resolved  WBCs within normal limits on 2/4  Afebrile, no signs/symptoms of infection 13.   AKI  Creatinine 1.14 on 2/4, will plan to repeat labs  Encourage fluids  LOS: 4 days A FACE TO FACE EVALUATION WAS PERFORMED  Dyke Weible Karis Juba 10/17/2020, 1:06 PM

## 2020-10-17 NOTE — Progress Notes (Signed)
Patient ID: Rebecca Singh, female   DOB: 1955/11/22, 65 y.o.   MRN: [id number]  SW returned phone call to pt sister Rebecca Singh [phone]) to discuss family edu. Reports her dtr Rebecca Singh will come in since she will help with a lot of her care needs and transportation to appointments. Fam edu scheduled for Wednesday 2/9 10am-3pm.   Cecile Sheerer, MSW, LCSWA Office: [phone] Cell: [phone] Fax: [phone]

## 2020-10-17 NOTE — Progress Notes (Signed)
Occupational Therapy Session Note  Patient Details  Name: Rebecca Singh MRN: [id number] Date of Birth: 02/17/1956  Today's Date: 10/17/2020 OT Individual Time: 0730-0830 OT Individual Time Calculation (min): 60 min    Short Term Goals: Week 1:  OT Short Term Goal 1 (Week 1): STG = LTGs due to ELOS  Skilled Therapeutic Interventions/Progress Updates:    Treatment session with focus on functional mobility and self-care retraining.  Pt received supine in bed unaware of time of scheduled therapy.  Pt reports severe HA yesterday and rates in 5/10 at this time.  Pt declined shower due to not feeling "up to it".  Pt reports need to toilet.  Ambulated to bathroom with RW with min assist and cues to attend to L side of body and environment.  Pt incontinent of stool in incontinence brief -unaware.  Pt voided loose stool on commode, required max assist for hygiene due to incontinence.  Pt be able to don pants with min assist to thread LLE, then able to pull pants over hips with cues to advance fully over L hip.  Pt ambulated back to sink with RW with min assist.  Pt completed grooming tasks seated at sink for energy conservation.  Engaged in LUE NMR in sitting with focus on increased reach and grasp.  Pt able to demonstrate loose gross grasp requiring increased time and effort to open hand - successfully opening thumb and first digit.  Pt able to stack 3 blocks with increased effort and cues to lift hand from surface and not slide along surface.  Pt continues to demonstrate fluctuating tone in hand, "locking" in to extreme flexion.  Pt remained upright in w/c with chair alarm on and all needs in reach.  Therapy Documentation Precautions:  Precautions Precautions: None Precaution Comments: L hemi, L inattention Restrictions Weight Bearing Restrictions: No General:   Vital Signs: Therapy Vitals Temp: 97.7 F (36.5 C) Temp Source: Oral Pulse Rate: (!) 52 Resp: 16 BP: 107/70 Patient Position (if  appropriate): Lying Oxygen Therapy SpO2: 94 % O2 Device: Room Air Pain: Pt reports headache 5/10, premedicated.  Therapy/Group: Individual Therapy  Rosalio Loud 10/17/2020, 8:02 AM

## 2020-10-17 NOTE — Progress Notes (Signed)
Patient continues to c/o headaches and chest pressure, stated she was 'freezing'. Temp within normal range. EKG done per order. On call provider Jacalyn Lefevre NP called and notified   10:20 pm  No new orders at this time, was told to continue to monitor patient. Patient is laying down with call bell within reach.

## 2020-10-17 NOTE — Plan of Care (Signed)
  Problem: Consults Goal: RH STROKE PATIENT EDUCATION Description: See Patient Education module for education specifics  Outcome: Progressing Goal: Diabetes Guidelines if Diabetic/Glucose > 140 Description: If diabetic or lab glucose is > 140 mg/dl - Initiate Diabetes/Hyperglycemia Guidelines & Document Interventions  Outcome: Progressing   Problem: RH SAFETY Goal: RH STG ADHERE TO SAFETY PRECAUTIONS W/ASSISTANCE/DEVICE Description: STG Adhere to Safety Precautions With Mod I Assistance/Device. Outcome: Progressing Goal: RH STG DECREASED RISK OF FALL WITH ASSISTANCE Description: STG Decreased Risk of Fall With Mod I Assistance. Outcome: Progressing   Problem: RH COGNITION-NURSING Goal: RH STG USES MEMORY AIDS/STRATEGIES W/ASSIST TO PROBLEM SOLVE Description: STG Uses Memory Aids/Strategies With Mod I Assistance to Problem Solve. Outcome: Progressing Goal: RH STG ANTICIPATES NEEDS/CALLS FOR ASSIST W/ASSIST/CUES Description: STG Anticipates Needs/Calls for Assist With Mod I Assistance/Cues. Outcome: Progressing   Problem: RH PAIN MANAGEMENT Goal: RH STG PAIN MANAGED AT OR BELOW PT'S PAIN GOAL Description: Assess and treat pain q shift and as needed Outcome: Progressing   Problem: RH KNOWLEDGE DEFICIT Goal: RH STG INCREASE KNOWLEDGE OF DIABETES Description: Mod I  Outcome: Progressing Goal: RH STG INCREASE KNOWLEDGE OF HYPERTENSION Outcome: Progressing   Problem: RH Vision Goal: RH LTG Vision (Specify) Outcome: Progressing   

## 2020-10-17 NOTE — Progress Notes (Signed)
Physical Therapy Session Note  Patient Details  Name: Rebecca Singh MRN: [id number] Date of Birth: December 22, 1955  Today's Date: 10/17/2020 PT Individual Time: 8841-6606 PT Individual Time Calculation (min): 57 min   Short Term Goals: Week 1:  PT Short Term Goal 1 (Week 1): STG=LTG due to LOS  Skilled Therapeutic Interventions/Progress Updates: Pt presents sitting in w/c and eager for therapy.  Pt given socks and sneakers and doffs slipper socks and dons socks and shoe to each foot w/ Figure-4 positioning.  PT ties shoe laces.  Pt negotiated w/c out of room and down hallway to Dayroom w/ supervision and directional assistance x 75' using BLEs (in reciprocal pattern) and RUE.  Pt amb w/ RW x 12' and min A and verbal cues for sequencing, especially step length during turns to approach mat table.  Pt required manual assist for management of RW.  Pt performed multiple sit to stand transfers w/ min A and occasional cueing for hand placement.  Pt performed standing and stacking Legos using BUE, w/ increased time and assist for LUE.  LUE continues to demonstrate strong grasp, inability to release.  Pt amb back to w/c w/ RW and min A, but improved foot clearance and step length.  Pt returned to room and amb 15' w/ RW and min A to bed.  Pt requires increased time for LUE release from RW.  Pt able to transfer from sit to supine w/ CGA for LLE, pt manually lifting LEs onto bed.  Pt states frustration w/ inability to perform " things I should be able to do", but PT assurance of therapy to improve performance received well.  Pt remained in bed w/ all needs in reach and bed alarm on.     Therapy Documentation Precautions:  Precautions Precautions: None Precaution Comments: L hemi, L inattention Restrictions Weight Bearing Restrictions: No General:   Vital Signs: Therapy Vitals Temp: 97.7 F (36.5 C) Temp Source: Oral Pulse Rate: (!) 58 Resp: 17 BP: 120/73 Patient Position (if appropriate):  Lying Oxygen Therapy SpO2: 96 % O2 Device: Room Air Pain: 5/10 HA      Therapy/Group: Individual Therapy  Lucio Edward 10/17/2020, 3:58 PM

## 2020-10-18 NOTE — Progress Notes (Signed)
Occupational Therapy Session Note  Patient Details  Name: Rebecca Singh MRN: [id number] Date of Birth: 09-22-1955  Today's Date: 10/18/2020 OT Individual Time: 9629-5284 OT Individual Time Calculation (min): 63 min    Short Term Goals: Week 1:  OT Short Term Goal 1 (Week 1): STG = LTGs due to ELOS  Skilled Therapeutic Interventions/Progress Updates: Focus this session was on shower, functional mobility for self care and home making skills and carryover of hemidressing techniques along with dynamic sitting and standing balance.   As well patient was educated on energy conservation as she tended to fatigue with all tasks of dynamic sitting or standing.  She completed shower and upper extremity dressing  with overall minA, with reminders to use left upper extremity, attend to left environment and carryover of hemidressinng and underdressing skills.  Completed lower body dressing with extra time and CGA for balance for sit to stand to pull up pants at walker.  As well, she required prompting to incorporate left upper extremity for bilateral tasks.  She did incorporate left uppper extremity to work shampoo lather into hair.  Patient was left in the care of nursing at end of session to initiate lunch after nursing care.  Continue OT Plan of care.     Therapy Documentation Precautions:  Precautions Precautions: None Precaution Comments: L hemi, L inattention Restrictions Weight Bearing Restrictions: No  Pain:denied    Therapy/Group: Individual Therapy  Rozelle Logan 10/18/2020, 4:37 PM

## 2020-10-18 NOTE — Plan of Care (Signed)
  Problem: Consults Goal: RH STROKE PATIENT EDUCATION Description: See Patient Education module for education specifics  Outcome: Progressing Goal: Diabetes Guidelines if Diabetic/Glucose > 140 Description: If diabetic or lab glucose is > 140 mg/dl - Initiate Diabetes/Hyperglycemia Guidelines & Document Interventions  Outcome: Progressing   Problem: RH SAFETY Goal: RH STG ADHERE TO SAFETY PRECAUTIONS W/ASSISTANCE/DEVICE Description: STG Adhere to Safety Precautions With Mod I Assistance/Device. Outcome: Progressing Goal: RH STG DECREASED RISK OF FALL WITH ASSISTANCE Description: STG Decreased Risk of Fall With Mod I Assistance. Outcome: Progressing   Problem: RH COGNITION-NURSING Goal: RH STG USES MEMORY AIDS/STRATEGIES W/ASSIST TO PROBLEM SOLVE Description: STG Uses Memory Aids/Strategies With Mod I Assistance to Problem Solve. Outcome: Progressing Goal: RH STG ANTICIPATES NEEDS/CALLS FOR ASSIST W/ASSIST/CUES Description: STG Anticipates Needs/Calls for Assist With Mod I Assistance/Cues. Outcome: Progressing   Problem: RH PAIN MANAGEMENT Goal: RH STG PAIN MANAGED AT OR BELOW PT'S PAIN GOAL Description: Assess and treat pain q shift and as needed Outcome: Progressing   Problem: RH KNOWLEDGE DEFICIT Goal: RH STG INCREASE KNOWLEDGE OF DIABETES Description: Mod I  Outcome: Progressing Goal: RH STG INCREASE KNOWLEDGE OF HYPERTENSION Outcome: Progressing   Problem: RH Vision Goal: RH LTG Vision (Specify) Outcome: Progressing

## 2020-10-18 NOTE — Progress Notes (Signed)
[place] PHYSICAL MEDICINE & REHABILITATION PROGRESS NOTE  Subjective/Complaints: Patient seen laying in bed this AM. She states she slept well overnight.  She states she had a good day yesterday.  She notes improvement in headaches.  She states she increased her water intake yesterday.  She notes diffuculty focusing this AM.    ROS: + Headaches, improving.  Denies CP, SOB, N/V  Objective: Vital Signs: Blood pressure 118/67, pulse (!) 51, temperature 97.8 F (36.6 C), temperature source Oral, resp. rate 18, height 5\' 8"  (1.727 m), weight 93.7 kg, SpO2 99 %. No results found. Recent Labs    10/17/20 0607  WBC 5.6  HGB 14.1  HCT 44.0  PLT 279   Recent Labs    10/17/20 0607  NA 138  K 3.9  CL 111  CO2 18*  GLUCOSE 98  BUN 32*  CREATININE 1.14*  CALCIUM 9.0    Intake/Output Summary (Last 24 hours) at 10/18/2020 1447 Last data filed at 10/18/2020 0847 Gross per 24 hour  Intake 1720 ml  Output --  Net 1720 ml        Physical Exam: BP 118/67 (BP Location: Right Arm)   Pulse (!) 51   Temp 97.8 F (36.6 C) (Oral)   Resp 18   Ht 5\' 8"  (1.727 m)   Wt 93.7 kg   SpO2 99%   BMI 31.41 kg/m  Constitutional: No distress . Vital signs reviewed.  Obese. HENT: Normocephalic.  Atraumatic. Eyes: EOMI. No discharge. Cardiovascular: No JVD.  RRR. Respiratory: Normal effort.  No stridor.  Bilateral clear to auscultation. GI: Non-distended.  BS +. Skin: Warm and dry.  Intact. Psych: Flat.  Anxious.  Appears improved this morning. Musc: No edema in extremities.  No tenderness in extremities. Neuro: Alert Follows commands.  Left homonymous hemianopsia.  LUE: 4 -/5 proximal distal with apraxia, stable Left lower extremity: 4-4+/5 hip flexion knee extension, 3/5 ankle dorsiflexion, stable No increase in tone appreciated  Assessment/Plan: 1. Functional deficits which require 3+ hours per day of interdisciplinary therapy in a comprehensive inpatient rehab setting.  Physiatrist  is providing close team supervision and 24 hour management of active medical problems listed below.  Physiatrist and rehab team continue to assess barriers to discharge/monitor patient progress toward functional and medical goals   Care Tool:  Bathing    Body parts bathed by patient: Face   Body parts bathed by helper: Right arm,Left lower leg     Bathing assist Assist Level: Supervision/Verbal cueing     Upper Body Dressing/Undressing Upper body dressing   What is the patient wearing?: Pull over shirt    Upper body assist Assist Level: Minimal Assistance - Patient > 75%    Lower Body Dressing/Undressing Lower body dressing      What is the patient wearing?: Pants,Incontinence brief     Lower body assist Assist for lower body dressing: Maximal Assistance - Patient 25 - 49%     Toileting Toileting    Toileting assist Assist for toileting: Moderate Assistance - Patient 50 - 74%     Transfers Chair/bed transfer  Transfers assist     Chair/bed transfer assist level: Minimal Assistance - Patient > 75%     Locomotion Ambulation   Ambulation assist      Assist level: Minimal Assistance - Patient > 75% Assistive device: Walker-rolling Max distance: 15   Walk 10 feet activity   Assist     Assist level: Minimal Assistance - Patient > 75% Assistive device: Walker-rolling  Walk 50 feet activity   Assist    Assist level: Moderate Assistance - Patient - 50 - 74% Assistive device: No Device    Walk 150 feet activity   Assist Walk 150 feet activity did not occur: Safety/medical concerns (fatigue, L hemi, weakness, poor endurance)         Walk 10 feet on uneven surface  activity   Assist Walk 10 feet on uneven surfaces activity did not occur: Safety/medical concerns (fatigue, L hemi, weakness, poor endurance)         Wheelchair     Assist Will patient use wheelchair at discharge?: Yes Type of Wheelchair: Manual    Wheelchair  assist level: Supervision/Verbal cueing Max wheelchair distance: 75    Wheelchair 50 feet with 2 turns activity    Assist        Assist Level: Supervision/Verbal cueing   Wheelchair 150 feet activity     Assist      Assist Level: Maximal Assistance - Patient 25 - 49%    Medical Problem List and Plan: 1.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 Antithrombotics: -DVT/anticoagulation:SCDs -antiplatelet therapy: Low-dose aspirin 81 mg daily initiated 10/13/2020 3. Pain Management/chronic back pain/vascular headache:  Topamax 25 mg twice daily, increased on 2/2  Fioricet as needed  Tylenol as needed  Heart rate unable to tolerate propanolol  Elavil 10 started on 2/4  See #13  Improving on 2/5 4. Mood:Provide emotional support  Fluoxetine started on 2/4   Appears to be improving -antipsychotic agents: N/A 5. Neuropsych: This patientiscapable of making decisions on herown behalf.  Appreciate neuropsych eval-discussed mood with neuropsych 6. Skin/Wound Care:Routine skin checks 7. Fluids/Electrolytes/Nutrition:Routine in and outs. 8. COPD/tobacco abuse. NicoDerm patch.   Counsel 9.  Chronic nonischemic cardiomyopathy/systolic CHF. Ejection fraction 35%. Follow-up cardiology services Filed Weights   10/13/20 1751 10/13/20 1801  Weight: 93.7 kg 93.7 kg   Status post pacemaker  Daily weights, remain pending on 2/5 10. Urge incontinence: timed voids  PVRs ordered, x1 performed, unremarkable  ?  Improving 11.  Prediabetes  Hemoglobin A1c 6.1 on 09/2020  Controlled on 2/4  Monitor with increased mobility 12.  Leukocytosis: Resolved  WBCs within normal limits on 2/4  Afebrile, no signs/symptoms of infection 13.  AKI  Creatinine 1.14 on 2/4, labs ordered for Monday  Continue to encourage fluids  LOS: 5 days A FACE TO FACE EVALUATION WAS  PERFORMED  Amina Menchaca Karis Juba 10/18/2020, 2:47 PM

## 2020-10-19 ENCOUNTER — Inpatient Hospital Stay (HOSPITAL_COMMUNITY): Payer: Medicare HMO

## 2020-10-19 NOTE — Progress Notes (Signed)
[place] PHYSICAL MEDICINE & REHABILITATION PROGRESS NOTE  Subjective/Complaints: Patient seen laying in bed this morning. She states she slept well overnight. She notes improvement in headaches. However, she notes visual changes in her left eye, which is relatively new.  ROS: + Visual disturbance in left eye.  Denies CP, SOB, N/V  Objective: Vital Signs: Blood pressure 112/70, pulse (!) 50, temperature 97.7 F (36.5 C), resp. rate 16, height 5\' 8"  (1.727 m), weight 90.8 kg, SpO2 98 %. No results found. Recent Labs    10/17/20 0607  WBC 5.6  HGB 14.1  HCT 44.0  PLT 279   Recent Labs    10/17/20 0607  NA 138  K 3.9  CL 111  CO2 18*  GLUCOSE 98  BUN 32*  CREATININE 1.14*  CALCIUM 9.0    Intake/Output Summary (Last 24 hours) at 10/19/2020 0931 Last data filed at 10/19/2020 0900 Gross per 24 hour  Intake 460 ml  Output --  Net 460 ml        Physical Exam: BP 112/70 (BP Location: Left Arm)   Pulse (!) 50   Temp 97.7 F (36.5 C)   Resp 16   Ht 5\' 8"  (1.727 m)   Wt 90.8 kg   SpO2 98%   BMI 30.44 kg/m  Constitutional: No distress . Vital signs reviewed. Obese. HENT: Normocephalic.  Atraumatic. Eyes: EOMI. No discharge. Cardiovascular: No JVD.  RRR. Respiratory: Normal effort.  No stridor.  Bilateral clear to auscultation. GI: Non-distended.  BS +. Skin: Warm and dry.  Intact. Psych: Normal mood.  Normal behavior. Musc: No edema in extremities.  No tenderness in extremities. Neuro: Alert Follows commands.  Left homonymous hemianopsia.  LUE: 4 -/5 proximal distal with apraxia, improving Left lower extremity: 4-4+/5 hip flexion knee extension, 3/5 ankle dorsiflexion, improving No increase in tone appreciated  Assessment/Plan: 1. Functional deficits which require 3+ hours per day of interdisciplinary therapy in a comprehensive inpatient rehab setting.  Physiatrist is providing close team supervision and 24 hour management of active medical problems listed  below.  Physiatrist and rehab team continue to assess barriers to discharge/monitor patient progress toward functional and medical goals   Care Tool:  Bathing    Body parts bathed by patient: Face   Body parts bathed by helper: Right arm,Left lower leg     Bathing assist Assist Level: Supervision/Verbal cueing     Upper Body Dressing/Undressing Upper body dressing   What is the patient wearing?: Pull over shirt    Upper body assist Assist Level: Minimal Assistance - Patient > 75%    Lower Body Dressing/Undressing Lower body dressing      What is the patient wearing?: Pants,Incontinence brief     Lower body assist Assist for lower body dressing: Minimal Assistance - Patient > 75% (with brief set up like underwear)     Toileting Toileting    Toileting assist Assist for toileting: Minimal Assistance - Patient > 75%     Transfers Chair/bed transfer  Transfers assist     Chair/bed transfer assist level: Minimal Assistance - Patient > 75%     Locomotion Ambulation   Ambulation assist      Assist level: Minimal Assistance - Patient > 75% Assistive device: Walker-rolling Max distance: 15   Walk 10 feet activity   Assist     Assist level: Minimal Assistance - Patient > 75% Assistive device: Walker-rolling   Walk 50 feet activity   Assist    Assist level: Moderate Assistance -  Patient - 50 - 74% Assistive device: No Device    Walk 150 feet activity   Assist Walk 150 feet activity did not occur: Safety/medical concerns (fatigue, L hemi, weakness, poor endurance)         Walk 10 feet on uneven surface  activity   Assist Walk 10 feet on uneven surfaces activity did not occur: Safety/medical concerns (fatigue, L hemi, weakness, poor endurance)         Wheelchair     Assist Will patient use wheelchair at discharge?: Yes Type of Wheelchair: Manual    Wheelchair assist level: Supervision/Verbal cueing Max wheelchair distance:  75    Wheelchair 50 feet with 2 turns activity    Assist        Assist Level: Supervision/Verbal cueing   Wheelchair 150 feet activity     Assist      Assist Level: Maximal Assistance - Patient 25 - 49%    Medical Problem List and Plan: 1.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 Antithrombotics: -DVT/anticoagulation:SCDs -antiplatelet therapy: Low-dose aspirin 81 mg daily initiated 10/13/2020 3. Pain Management/chronic back pain/vascular headache:  Topamax 25 mg twice daily, increased on 2/2  Fioricet as needed  Tylenol as needed  Heart rate unable to tolerate propanolol  Elavil 10 started on 2/4  See #13  Improving on 2/6 4. Mood:Provide emotional support  Fluoxetine started on 2/4 DC'd on 2/7 due to? Side effects   Appears to be improving -antipsychotic agents: N/A 5. Neuropsych: This patientiscapable of making decisions on herown behalf.  Appreciate neuropsych eval-discussed mood with neuropsych 6. Skin/Wound Care:Routine skin checks 7. Fluids/Electrolytes/Nutrition:Routine in and outs. 8. COPD/tobacco abuse. NicoDerm patch.   Counsel 9.  Chronic nonischemic cardiomyopathy/systolic CHF. Ejection fraction 35%. Follow-up cardiology services Filed Weights   10/13/20 1751 10/13/20 1801 10/19/20 0500  Weight: 93.7 kg 93.7 kg 90.8 kg   Status post pacemaker  Daily weights  ? Stable on 2/6 10. Urge incontinence: timed voids  PVRs ordered, x1 performed, unremarkable  ?  Improving 11.  Prediabetes  Hemoglobin A1c 6.1 on 09/2020  Controlled on 2/4  Monitor with increased mobility 12.  Leukocytosis: Resolved  WBCs within normal limits on 2/4  Afebrile, no signs/symptoms of infection 13.  AKI  Creatinine 1.14 on 2/4, labs ordered for tomorrow  Continue to encourage fluids  LOS: 6 days A FACE TO FACE EVALUATION WAS PERFORMED  Dory Demont Karis Juba 10/19/2020, 9:31 AM

## 2020-10-19 NOTE — Progress Notes (Signed)
Physical Therapy Session Note  Patient Details  Name: Rebecca Singh MRN: [id number] Date of Birth: 06-07-1956  Today's Date: 10/19/2020 PT Individual Time: 1000-1100; 7672-0947 PT Individual Time Calculation (min): 60 min and 53 min  Short Term Goals: Week 1:  PT Short Term Goal 1 (Week 1): STG=LTG due to LOS  Skilled Therapeutic Interventions/Progress Updates:    Session 1: Pt received seated in w/c in room, agreeable to PT session. No complaints of pain. Sit to stand with close Supervision to CGA to RW throughout session. Ambulation 3 x 75 ft, 1 x 50 ft with RW and CGA to min A throughout session. Initially with gait pt exhibits decreased B heel strike and step length and tends to land flat-footed with each step with poor eccentric control. Practiced alt L/R step-overs of hockey stick with focus on heel strike and increased LE clearance with RW and CGA for balance. Pt able to carryover motor patterns to gait and exhibits improved heel/toe gait pattern. Pt does continue to exhibit decreased eccentric control of limb and requires cues for improved gait mechanics. Pt exhibits some anxiety and irritability at times regarding her current physical deficits and frustration regarding this, provided emotional support. Pt left seated in w/c in room with needs in reach, chair alarm in place at end of session.  Session 2: Pt received seated on toilet handed off from NT, agreeable to PT session. No complaints of pain. Pt does report L eye blurriness and seeing dots in that eye, MD aware and pt reports she has CT scan later this PM. Toilet transfer with Supervision with use of RW, some assist needed for clothing management. Ambulation 2 x 200 ft with RW and CGA to min A with focus on heel strike, increased LE clearance, and upright posture. Pt easily overwhelmed by too many verbal cues during gait as well as too many things to focus on, exhibits improved performance when limiting focus to 3 items. Standing  alt L/R 6" step taps with RW and CGA for balance, 2 x 20 reps to fatigue for LE strengthening and coordination. Pt left seated in w/c in room with needs in reach, chair alarm in place at end of session.  Therapy Documentation Precautions:  Precautions Precautions: None Precaution Comments: L hemi, L inattention Restrictions Weight Bearing Restrictions: No   Therapy/Group: Individual Therapy   Peter Congo, PT, DPT  10/19/2020, 12:38 PM

## 2020-10-19 NOTE — Plan of Care (Signed)
  Problem: Consults Goal: RH STROKE PATIENT EDUCATION Description: See Patient Education module for education specifics  Outcome: Progressing Goal: Diabetes Guidelines if Diabetic/Glucose > 140 Description: If diabetic or lab glucose is > 140 mg/dl - Initiate Diabetes/Hyperglycemia Guidelines & Document Interventions  Outcome: Progressing   Problem: RH SAFETY Goal: RH STG ADHERE TO SAFETY PRECAUTIONS W/ASSISTANCE/DEVICE Description: STG Adhere to Safety Precautions With Mod I Assistance/Device. Outcome: Progressing Goal: RH STG DECREASED RISK OF FALL WITH ASSISTANCE Description: STG Decreased Risk of Fall With Mod I Assistance. Outcome: Progressing   Problem: RH COGNITION-NURSING Goal: RH STG USES MEMORY AIDS/STRATEGIES W/ASSIST TO PROBLEM SOLVE Description: STG Uses Memory Aids/Strategies With Mod I Assistance to Problem Solve. Outcome: Progressing Goal: RH STG ANTICIPATES NEEDS/CALLS FOR ASSIST W/ASSIST/CUES Description: STG Anticipates Needs/Calls for Assist With Mod I Assistance/Cues. Outcome: Progressing   Problem: RH PAIN MANAGEMENT Goal: RH STG PAIN MANAGED AT OR BELOW PT'S PAIN GOAL Description: Assess and treat pain q shift and as needed Outcome: Progressing   Problem: RH KNOWLEDGE DEFICIT Goal: RH STG INCREASE KNOWLEDGE OF DIABETES Description: Mod I  Outcome: Progressing Goal: RH STG INCREASE KNOWLEDGE OF HYPERTENSION Outcome: Progressing   Problem: RH Vision Goal: RH LTG Vision (Specify) Outcome: Progressing   

## 2020-10-19 NOTE — Progress Notes (Signed)
Occupational Therapy Session Note  Patient Details  Name: Rebecca Singh MRN: [id number] Date of Birth: 06-22-1956  Today's Date: 10/19/2020 OT Individual Time: [phone] and [id number] OT Individual Time Calculation (min): 55 min and 26 min   Short Term Goals: Week 1:  OT Short Term Goal 1 (Week 1): STG = LTGs due to ELOS  Skilled Therapeutic Interventions/Progress Updates:    Pt greeted in bed, agreeable to tx and requesting to start session by using the restroom. She donned her gripper socks with increased time, vcs for including the Lt hand during task however pt only used the Rt hand. CGA for ambulatory transfer to elevated toilet using RW, vcs for Lt hand placement during sit<stand. She required Min A to lower clothing with pt having +bladder void. Once hygiene was completed while seated, she required Min A to fully elevate brief and new pants, HOH to include the Lt hand with pt exhibiting flexor tone in the arm and extensor tone in the hand during dynamic standing. While seated at the sink afterwards, min facilitation and/or cuing to include the Lt hand bimanually during hand washing, face washing, and oral care. Multiple grooming items placed on her Lt side to promote Lt visual field scanning/Lt attention. At end of session she wanted to remain sitting up in the w/c, left her with all needs within reach and chair alarm set. Tx focus placed on Lt NMR, ADL retraining, dynamic balance, and functional transfers.   Note that the environment was set up today to accommodate pts sensitivity to light + sound.   2nd Session 1:1 tx (26 min) Pt greeted in the w/c with ADL needs met and no c/o pain. Tx focus was placed on Lt UE NMR and Lt attention via familiar and meaningful IADL task of folding linen. She required cues to increase functional use of the Lt hand, did better with repetition when folding towels and wash cloths. At end of session pt remained sitting up in the w/c, left with all needs  within reach.   Therapy Documentation Precautions:  Precautions Precautions: None Precaution Comments: L hemi, L inattention Restrictions Weight Bearing Restrictions: No Pain: HA pain during 1st session, RN in during session to provide pain medicine + OT also provided pt with lavender aromatherapy to holistically address Pain Assessment Pain Scale: 0-10 Pain Score: 8  Pain Type: Acute pain Pain Location: Head Pain Descriptors / Indicators: Headache Pain Frequency: Occasional Pain Onset: Unable to tell Patients Stated Pain Goal: 4 Pain Intervention(s): Medication (See eMAR) ADL: ADL Eating: Set up Where Assessed-Eating: Bed level Grooming: Setup Where Assessed-Grooming: Sitting at sink Upper Body Bathing: Supervision/safety Where Assessed-Upper Body Bathing: Shower Lower Body Bathing: Minimal assistance Where Assessed-Lower Body Bathing: Shower Upper Body Dressing: Minimal assistance Where Assessed-Upper Body Dressing: Chair Lower Body Dressing: Moderate assistance Where Assessed-Lower Body Dressing: Chair Toileting: Minimal assistance Where Assessed-Toileting: Toilet,Bedside Commode Toilet Transfer: Moderate assistance Toilet Transfer Method: Counselling psychologist: Geophysical data processor: Environmental education officer Method: Heritage manager: Transfer tub bench,Grab bars :     Therapy/Group: Individual Therapy  Daichi Moris A Jameer Storie 10/19/2020, 12:45 PM

## 2020-10-20 NOTE — Plan of Care (Signed)
  Problem: Consults Goal: RH STROKE PATIENT EDUCATION Description: See Patient Education module for education specifics  Outcome: Progressing Goal: Diabetes Guidelines if Diabetic/Glucose > 140 Description: If diabetic or lab glucose is > 140 mg/dl - Initiate Diabetes/Hyperglycemia Guidelines & Document Interventions  Outcome: Progressing   Problem: RH SAFETY Goal: RH STG ADHERE TO SAFETY PRECAUTIONS W/ASSISTANCE/DEVICE Description: STG Adhere to Safety Precautions With Mod I Assistance/Device. Outcome: Progressing Goal: RH STG DECREASED RISK OF FALL WITH ASSISTANCE Description: STG Decreased Risk of Fall With Mod I Assistance. Outcome: Progressing   Problem: RH COGNITION-NURSING Goal: RH STG USES MEMORY AIDS/STRATEGIES W/ASSIST TO PROBLEM SOLVE Description: STG Uses Memory Aids/Strategies With Mod I Assistance to Problem Solve. Outcome: Progressing Goal: RH STG ANTICIPATES NEEDS/CALLS FOR ASSIST W/ASSIST/CUES Description: STG Anticipates Needs/Calls for Assist With Mod I Assistance/Cues. Outcome: Progressing   Problem: RH PAIN MANAGEMENT Goal: RH STG PAIN MANAGED AT OR BELOW PT'S PAIN GOAL Description: Assess and treat pain q shift and as needed Outcome: Progressing   Problem: RH KNOWLEDGE DEFICIT Goal: RH STG INCREASE KNOWLEDGE OF DIABETES Description: Mod I  Outcome: Progressing Goal: RH STG INCREASE KNOWLEDGE OF HYPERTENSION Outcome: Progressing   Problem: RH Vision Goal: RH LTG Vision (Specify) Outcome: Progressing   

## 2020-10-20 NOTE — Progress Notes (Signed)
Physical Therapy Session Note  Patient Details  Name: Rebecca Singh MRN: [id number] Date of Birth: 07/31/1956  Today's Date: 10/20/2020 PT Individual Time: 0800-0857 and 1100-1155  PT Individual Time Calculation (min): 57 min and 55 min  Short Term Goals: Week 1:  PT Short Term Goal 1 (Week 1): STG=LTG due to LOS  Skilled Therapeutic Interventions/Progress Updates:   Treatment Session 1: 0800-0957 57 min Received pt on commode with NT attending to care, PT took over with care, pt agreeable to therapy, and denied any pain during session. Session with emphasis on toileting, functional mobility/transfers, generalized strengthening, dynamic standing balance/coordination, ambulation, NMR, motor control/sequencing, and improved activity tolerance. Donned clean brief with max A and pt transferred sit<>stand with RW and CGA and able to perform peri-care standing with CGA. Pt required max A to pull pants over hips for time management purposes. Pt with loose BM and NT made aware. Pt stood at sink and washed hands and brushed teeth with supervision. Doffed non-skid socks and donned clean socks and shoes with supervision and increased time but required assist to tie laces. Pt transported to therapy gym in [facility] total A for time management purposes and ambulated 56ft with SPC and min A. Pt reported feeling "uncomfortable" using the cane and demonstrated wide BOS, decreased heel strike, flexed trunk, shuffling gait, and decreased bilateral foot clearance. Pt then ambulated additional 26ft with RW and CGA/close supervision. Pt demonstrated improved L foot clearance, posture, improved heel strike, and improved weight shifting. Pt transferred sit<>stand with RW and CGA and performed alternating heel taps to 3in step 2x20 reps with emphasis on L weight shifting, eccentric control, and L foot DF. Pt reported feeling frustrated with amount of energy it takes to focus and concentrate and reports getting too many cues is  overwhelming and stresses her out. Pt transported back to room in [facility] total A and ambulated 33ft with RW and supervision to recliner. Concluded session with pt sitting in recliner, needs within reach, and chair pad alarm on. MD present for morning rounds.   Treatment Session 2: 1100-1155 55 min Received pt sitting in recliner, pt agreeable to therapy, and denied any pain during session. Session with emphasis on functional mobility/transfers, generalized strengthening, dynamic standing balance/coordination, ambulation, stair navigation, NMR, motor control/sequencing, and improved activity tolerance. Pt ambulated 131ft with RW and CGA to therapy gym and demonstrated improved LLE foot clearance and improved L heel strike. Pt navigated 12 steps with 2 rails and CGA ascending and min A descending using a step to pattern. Pt with poor eccentric control lowering LLE from step resulting in foot slap. However, pt able to correct with verbal cues. Pt performed the following balance activities on [employer]: Noted pt closing L eye to focus on screen. Pt reports having floaters in R eye and has had difficulty with vision since her stroke.  -Postural stability training for 1 minute and 30 seconds on level static with BUE support -Weight shifting training for 2 minutes on level static with BUE support.  -limits of stability training for 2 minutes on level static with BUE support. Pt with increased difficulty weight shifting to L. Pt reported her niece will be present for family education training on Wednesday and will be available as needed upon D/C. Pt reported being concerned about grocery shopping upon D/C. Therapist recommended having someone grocery shop for her initially as pt has tendency to get easily distracted and has difficulty with dual tasking. Pt transported back to room in [facility] total  A and ambulated additional 69ft with RW and CGA to recliner. Concluded session with pt sitting in recliner, needs within reach, and  chair pad alarm on.   Therapy Documentation Precautions:  Precautions Precautions: None Precaution Comments: L hemi, L inattention Restrictions Weight Bearing Restrictions: No  Therapy/Group: Individual Therapy Martin Majestic PT, DPT   10/20/2020, 7:26 AM

## 2020-10-20 NOTE — Plan of Care (Signed)
  Problem: RH Light Housekeeping Goal: LTG Patient will perform light housekeeping w/assist (OT) Description: LTG: Patient will perform light housekeeping with assistance, with/without cues (OT). Outcome: Not Applicable Flowsheets (Taken 10/20/2020 0810) LTG: Pt will perform light housekeeping with assistance level of: (D/C) -- Note: D/C as not a focus at this time   Problem: RH Toilet Transfers Goal: LTG Patient will perform toilet transfers w/assist (OT) Description: LTG: Patient will perform toilet transfers with assist, with/without cues using equipment (OT) Flowsheets (Taken 10/20/2020 0810) LTG: Pt will perform toilet transfers with assistance level of: (downgraded) Supervision/Verbal cueing Note: Downgraded due to decreased functional mobility and requiring assist due to L inattention and tone in LUE impacting safe use of AD

## 2020-10-20 NOTE — Progress Notes (Signed)
Occupational Therapy Session Note  Patient Details  Name: Rebecca Singh MRN: [id number] Date of Birth: 1956/02/21  Today's Date: 10/20/2020 OT Individual Time: 5188-4166 OT Individual Time Calculation (min): 58 min    Short Term Goals: Week 1:  OT Short Term Goal 1 (Week 1): STG = LTGs due to ELOS  Skilled Therapeutic Interventions/Progress Updates:    Treatment session with focus on functional mobility and LUE NMR.  Pt received reclined in recliner agreeable to therapy session.  Pt ambulated 20' to w/c with RW with CGA.  Engaged in LUE NMR in sitting and standing with focus on increased functional use.  Pt demonstrating increased motor control with grasp and release as well as improved fine motor coordination to include picking up and manipulating large pegs and PVC pipe pieces.  Pt initially demonstrating difficulty lifting hand from table when building with pipe tree, however as session progressed pt demonstrating increased reach against gravity.  Pt completed bead threading activity with use of BUE with improved coordination and motor control even down to small wooden beads.  Pt returned to room and completed stand pivot transfer w/c> bed without AD with min assist.  Pt able to lift LLE in to bed without assistance.  Pt remained semi-reclined in bed with all needs in reach.  Therapy Documentation Precautions:  Precautions Precautions: None Precaution Comments: L hemi, L inattention Restrictions Weight Bearing Restrictions: No General:   Vital Signs: Therapy Vitals Temp: 97.8 F (36.6 C) Pulse Rate: 67 Resp: 18 BP: 115/79 Patient Position (if appropriate): Lying Oxygen Therapy SpO2: 100 % O2 Device: Room Air Pain:  Pt with no c/o pain   Therapy/Group: Individual Therapy  Rosalio Loud 10/20/2020, 3:19 PM

## 2020-10-20 NOTE — Progress Notes (Signed)
[place] PHYSICAL MEDICINE & REHABILITATION PROGRESS NOTE  Subjective/Complaints: Just returning with therapy from a walk. Still with ?visual changes OS. Asked about CT results. I reviewed them with her  ROS: Patient denies fever, rash, sore throat,   nausea, vomiting, diarrhea, cough, shortness of breath or chest pain, joint or back pain, headache, or mood change.   Objective: Vital Signs: Blood pressure 112/77, pulse 68, temperature 98.3 F (36.8 C), temperature source Oral, resp. rate 14, height 5\' 8"  (1.727 m), weight 90.7 kg, SpO2 97 %. CT HEAD WO CONTRAST  Result Date: 10/19/2020 CLINICAL DATA:  Stroke.  Visual disturbance. EXAM: CT HEAD WITHOUT CONTRAST TECHNIQUE: Contiguous axial images were obtained from the base of the skull through the vertex without intravenous contrast. COMPARISON:  10/13/2020 FINDINGS: Brain: No new or progressive finding. Areas of acute infarction in the right middle cerebral artery territory affecting the insula and frontoparietal junction cortical and subcortical brain are becoming less apparent. No longer any hyperdense material in the sylvian fissure. No evidence of mass effect or hemorrhage. The remainder the brain appears normal. No hydrocephalus or extra-axial collection Vascular: No acute vascular finding. There is atherosclerotic calcification of the major vessels at the base of the brain. Skull: Negative Sinuses/Orbits: Clear/normal Other: None IMPRESSION: No new or progressive finding. Areas of acute infarction in the right middle cerebral artery territory are becoming less apparent. No longer any hyperdense material in the Sylvian fissure. Electronically Signed   By: 10/15/2020 M.D.   On: 10/19/2020 20:26   No results for input(s): WBC, HGB, HCT, PLT in the last 72 hours. No results for input(s): NA, K, CL, CO2, GLUCOSE, BUN, CREATININE, CALCIUM in the last 72 hours.  Intake/Output Summary (Last 24 hours) at 10/20/2020 1119 Last data filed at 10/20/2020  0700 Gross per 24 hour  Intake 360 ml  Output 500 ml  Net -140 ml        Physical Exam: BP 112/77 (BP Location: Left Arm)   Pulse 68   Temp 98.3 F (36.8 C) (Oral)   Resp 14   Ht 5\' 8"  (1.727 m)   Wt 90.7 kg   SpO2 97%   BMI 30.40 kg/m  Constitutional: No distress . Vital signs reviewed. HEENT: EOMI, oral membranes moist Neck: supple Cardiovascular: RRR without murmur. No JVD    Respiratory/Chest: CTA Bilaterally without wheezes or rales. Normal effort    GI/Abdomen: BS +, non-tender, non-distended Ext: no clubbing, cyanosis, or edema Psych: pleasant and cooperative Skin: intact Musc: No edema in extremities.  No tenderness in extremities. Neuro: Alert. Demonstrates good insight and awareness.  Follows commands.  Left homonymous hemianopsia remains present--no changes  LUE: 4-/5 proximal distal with ongoing apraxia Left lower extremity: 4-4+/5 hip flexion knee extension, 3+/5 ankle dorsiflexion, improving--apraxic also Normal tone.   Assessment/Plan: 1. Functional deficits which require 3+ hours per day of interdisciplinary therapy in a comprehensive inpatient rehab setting.  Physiatrist is providing close team supervision and 24 hour management of active medical problems listed below.  Physiatrist and rehab team continue to assess barriers to discharge/monitor patient progress toward functional and medical goals   Care Tool:  Bathing    Body parts bathed by patient: Face   Body parts bathed by helper: Right arm,Left lower leg     Bathing assist Assist Level: Supervision/Verbal cueing     Upper Body Dressing/Undressing Upper body dressing   What is the patient wearing?: Pull over shirt    Upper body assist Assist Level: Minimal  Assistance - Patient > 75%    Lower Body Dressing/Undressing Lower body dressing      What is the patient wearing?: Pants,Incontinence brief     Lower body assist Assist for lower body dressing: Minimal Assistance -  Patient > 75% (with brief set up like underwear)     Toileting Toileting    Toileting assist Assist for toileting: Minimal Assistance - Patient > 75%     Transfers Chair/bed transfer  Transfers assist     Chair/bed transfer assist level: Contact Guard/Touching assist     Locomotion Ambulation   Ambulation assist      Assist level: Contact Guard/Touching assist Assistive device: Walker-rolling Max distance: 47ft   Walk 10 feet activity   Assist     Assist level: Contact Guard/Touching assist Assistive device: Walker-rolling   Walk 50 feet activity   Assist    Assist level: Contact Guard/Touching assist Assistive device: Walker-rolling    Walk 150 feet activity   Assist Walk 150 feet activity did not occur: Safety/medical concerns (fatigue, L hemi, weakness, poor endurance)  Assist level: Minimal Assistance - Patient > 75% Assistive device: Walker-rolling    Walk 10 feet on uneven surface  activity   Assist Walk 10 feet on uneven surfaces activity did not occur: Safety/medical concerns (fatigue, L hemi, weakness, poor endurance)         Wheelchair     Assist Will patient use wheelchair at discharge?: Yes Type of Wheelchair: Manual    Wheelchair assist level: Supervision/Verbal cueing Max wheelchair distance: 75    Wheelchair 50 feet with 2 turns activity    Assist        Assist Level: Supervision/Verbal cueing   Wheelchair 150 feet activity     Assist      Assist Level: Maximal Assistance - Patient 25 - 49%    Medical Problem List and Plan: 1.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 Antithrombotics: -DVT/anticoagulation:SCDs -antiplatelet therapy: Low-dose aspirin 81 mg daily initiated 10/13/2020 3. Pain  Management/chronic back pain/vascular headache:  Topamax 25 mg twice daily, increased on 2/2  Fioricet as needed  Tylenol as needed  Heart rate unable to tolerate propanolol  Elavil 10 started on 2/4  See #13  Did not c/o headache today 2/7 4. Mood:Provide emotional support  Fluoxetine started on 2/4 DC'd on 2/7 due to? Side effects   Appears to be improving -antipsychotic agents: N/A 5. Neuropsych: This patientiscapable of making decisions on herown behalf.  Appreciate neuropsych eval-discussed mood with neuropsych 6. Skin/Wound Care:Routine skin checks 7. Fluids/Electrolytes/Nutrition:Routine in and outs. 8. COPD/tobacco abuse. NicoDerm patch.   Counsel 9.  Chronic nonischemic cardiomyopathy/systolic CHF. Ejection fraction 35%. Follow-up cardiology services Filed Weights   10/13/20 1801 10/19/20 0500 10/20/20 0332  Weight: 93.7 kg 90.8 kg 90.7 kg   Status post pacemaker  Daily weights  Stable on 2/7 10. Urge incontinence: timed voids  PVRs ordered, x1 performed, unremarkable    Improving 11.  Prediabetes  Hemoglobin A1c 6.1 on 09/2020  Controlled on 2/4    12.  Leukocytosis: Resolved  WBCs within normal limits on 2/4  Afebrile, no signs/symptoms of infection 13.  AKI  Creatinine 1.14 on 2/4, no labs ordered today 2/7  Continue to encourage fluids  -will order bmet for 2/8  LOS: 7 days A FACE TO FACE EVALUATION WAS PERFORMED  Ranelle Oyster 10/20/2020, 11:19 AM

## 2020-10-21 DIAGNOSIS — H539 Unspecified visual disturbance: Secondary | ICD-10-CM

## 2020-10-21 LAB — BASIC METABOLIC PANEL
Anion gap: 10 (ref 5–15)
BUN: 27 mg/dL — ABNORMAL HIGH (ref 8–23)
CO2: 19 mmol/L — ABNORMAL LOW (ref 22–32)
Calcium: 9.3 mg/dL (ref 8.9–10.3)
Chloride: 110 mmol/L (ref 98–111)
Creatinine, Ser: 0.94 mg/dL (ref 0.44–1.00)
GFR, Estimated: >60 mL/min (ref >60)
Glucose, Bld: 100 mg/dL — ABNORMAL HIGH (ref 70–99)
Potassium: 3.5 mmol/L (ref 3.5–5.1)
Sodium: 139 mmol/L (ref 135–145)

## 2020-10-21 NOTE — Progress Notes (Signed)
Physical Therapy Session Note  Patient Details  Name: Rebecca Singh MRN: [id number] Date of Birth: April 14, 1956  Today's Date: 10/21/2020 PT Individual Time: 2952-8413 and 2440-1027  PT Individual Time Calculation (min): 54 min and 24 min  Short Term Goals: Week 1:  PT Short Term Goal 1 (Week 1): STG=LTG due to LOS  Skilled Therapeutic Interventions/Progress Updates:   Treatment Session 1: 2536-6440 54 min Received pt supine in bed, pt agreeable to therapy, and denied any pain during session. RN present to administer medications. Session with emphasis on functional mobility/transfers, generalized strengthening, dynamic standing balance/coordination, ambulation, NMR, motor control/planning, and improved activity tolerance. Pt transferred supine<>sitting EOB with HOB elevated and use of bedrails with supervision and sat EOB and doffed non-skid socks, donned regular socks and shoes with supervision in figure four position with increased time. Pt required assist from therapist to tie shoes. Pt ambulated 22ft x 2 trials to/from bathroom with RW and close supervision. Pt able to manage clothing standing with close supervision and void. Pt stood at sink and washed hands and brushed teeth with supervision. Pt ambulated 166ft with RW and close supervision/CGA to therapy gym. Pt demonstrated decreased L foot clearance and weight shifting to L, but improved ability to heel strike. Worked on dynamic standing balance tapping each LE to 3 cones for 3 reps x 2 trials with BUE support on RW and CGA with emphasis on L weight shifting and hip flexion. Pt with difficulty lifting LEs resulting in sliding feet to cones. With verbal cues pt demonstrated improvements in L hip flexion. Pt hesitant during activity stating "I don't trust this leg". Therapist educated pt on importance of weight bearing on "weaker" leg for strength/balance and pt verbalized understanding. Pt performed mini-squats 2x8 with BUE support on RW and  CGA with emphasis on L lateral weight shifting and equal weight distrubution. Pt very fearful with activity stating "you're going to give me anxiety doing this" but able to complete activity with encouragement. Pt transported back to room in [facility] total A. Concluded session with pt sitting in WC washing up at sink in preparation for OT, needs within reach, and chair pad alarm on.    Treatment Session 2: 1500-1524 24 min Received pt sitting in WC with OT braiding pt's hair. Pt agreeable to therapy and denied any pain during session. Pt reported trying to eat a few bites of tuna for lunch and then having diarrhea. Pt reported feeling frustrated and embarrassed and reported not wanting to eat due to frequency for loose stool. Session with emphasis on functional mobility/transfers, generalized strengthening, dynamic standing balance/coordination, ambulation, and improved activity tolerance. Pt donned regular socks and shoes with supervision and required total A to tie shoe laces. Pt ambulated 58ft x 2 trials with RW and CGA/close supervision with 1 extended seated rest break. Pt required cues to readjust L grip on RW but demonstrates improved ability to grasp and release RW. Concluded session with pt sitting in recliner, needs within reach, and chair pad alarm on.    Therapy Documentation Precautions:  Precautions Precautions: None Precaution Comments: L hemi, L inattention Restrictions Weight Bearing Restrictions: No  Therapy/Group: Individual Therapy Martin Majestic PT, DPT   10/21/2020, 7:16 AM

## 2020-10-21 NOTE — Plan of Care (Signed)
  Problem: RH Stairs Goal: LTG Patient will ambulate up and down stairs w/assist (PT) Description: LTG: Patient will ambulate up and down # of stairs with assistance (PT) Flowsheets (Taken 10/21/2020 0744) LTG: Pt will ambulate up/down stairs assist needed:: (downgraded due to decreaed balance and L sided weakness) Contact Guard/Touching assist LTG: Pt will  ambulate up and down number of stairs: 7 steps with 2 rails Note: downgraded due to decreaed balance and L sided weakness

## 2020-10-21 NOTE — Progress Notes (Signed)
Occupational Therapy Session Note  Patient Details  Name: NIARA BUNKER MRN: [id number] Date of Birth: [birth year]  Today's Date: 10/21/2020 OT Individual Time: 0930-1000 and 1406-1500 OT Individual Time Calculation (min): 30 min and 54 min   Short Term Goals: Week 1:  OT Short Term Goal 1 (Week 1): STG = LTGs due to ELOS  Skilled Therapeutic Interventions/Progress Updates:    1) Treatment session with focus on gross and fine motor control of LUE.  Engaged in bimanual activity to facilitate increased gross and fine motor control of LUE.  Initially used dowel rod between BUE, however pt with increased difficulty and effort when using dowel therefore modified to ball toss and catch.  Pt demonstrating decreased motor planning with reaching for and then attempting to toss ball, but a decrease in effort/holding breath.  With repetition pt demonstrating increased initiation of movements, however LUE delayed when attempting to catch or toss ball.  Pt reports "but it looked so easy".  Pt returned to recliner and left semi-reclined with chair alarm on and all needs in reach.  2) Treatment session with focus on self-care retraining and functional transfers.  Pt received supine in recliner agreeable to shower.  Pt ambulated around room with RW with supervision to gather all items prior to shower.  Pt demonstrating improved use of LUE with managing RW.  Pt toileted prior to shower and doffed clothing at toilet.  Pt ambulated to shower and completed bathing at overall seated position with lateral leans to wash buttocks.  Pt utilized LUE ~25% of bathing, requiring cues to incorporate when washing hair.  Pt reports urgent need to toilet.  Ambulated to toilet with RW with close supervision.  Pt with loose stool on toilet - reports loose stools daily.  Pt completed hygiene post toileting with setup.  Pt completed dressing at sit > stand level from [facility] over toilet with setup assist.  Pt then ambulated to sink and  completed grooming tasks from seated level due to fatigue and donned shoes and socks with setup.  Pt passed off to PT.  Therapy Documentation Precautions:  Precautions Precautions: None Precaution Comments: L hemi, L inattention Restrictions Weight Bearing Restrictions: No Pain:  Pt with no c/o pain   Therapy/Group: Individual Therapy  Rosalio Loud 10/21/2020, 3:38 PM

## 2020-10-21 NOTE — Progress Notes (Signed)
Rebecca Singh  Subjective/Complaints: Patient seen laying in bed this AM.  She states she slept well overnight.  She denies complaints. Vision stable.   ROS: Denies CP, SOB, N/V/D  Objective: Vital Signs: Blood pressure 113/75, pulse (!) 58, temperature 97.8 F (36.6 C), temperature source Oral, resp. rate 16, height 5\' 8"  (1.727 m), weight 90.9 kg, SpO2 99 %. CT HEAD WO CONTRAST  Result Date: 10/19/2020 CLINICAL DATA:  Stroke.  Visual disturbance. EXAM: CT HEAD WITHOUT CONTRAST TECHNIQUE: Contiguous axial images were obtained from the base of the skull through the vertex without intravenous contrast. COMPARISON:  10/13/2020 FINDINGS: Brain: No new or progressive finding. Areas of acute infarction in the right middle cerebral artery territory affecting the insula and frontoparietal junction cortical and subcortical brain are becoming less apparent. No longer any hyperdense material in the sylvian fissure. No evidence of mass effect or hemorrhage. The remainder the brain appears normal. No hydrocephalus or extra-axial collection Vascular: No acute vascular finding. There is atherosclerotic calcification of the major vessels at the base of the brain. Skull: Negative Sinuses/Orbits: Clear/normal Other: None IMPRESSION: No new or progressive finding. Areas of acute infarction in the right middle cerebral artery territory are becoming less apparent. No longer any hyperdense material in the Sylvian fissure. Electronically Signed   By: 10/15/2020 M.D.   On: 10/19/2020 20:26   No results for input(s): WBC, HGB, HCT, PLT in the last 72 hours. Recent Labs    10/21/20 0519  NA 139  K 3.5  CL 110  CO2 19*  GLUCOSE 100*  BUN 27*  CREATININE 0.94  CALCIUM 9.3    Intake/Output Summary (Last 24 hours) at 10/21/2020 0843 Last data filed at 10/20/2020 1900 Gross per 24 hour  Intake 118 ml  Output --  Net 118 ml        Physical Exam: BP 113/75 (BP  Location: Left Arm)   Pulse (!) 58   Temp 97.8 F (36.6 C) (Oral)   Resp 16   Ht 5\' 8"  (1.727 m)   Wt 90.9 kg   SpO2 99%   BMI 30.47 kg/m   Constitutional: No distress . Vital signs reviewed. HENT: Normocephalic.  Atraumatic. Eyes: EOMI. No discharge. Cardiovascular: No JVD.  RRR. Respiratory: Normal effort.  No stridor.  Bilateral clear to auscultation. GI: Non-distended.  BS +. Skin: Warm and dry.  Intact. Psych: Normal mood.  Normal behavior. Musc: No edema in extremities.  No tenderness in extremities. Neuro: Alert Demonstrates good insight and awareness.  Follows commands.  Left homonymous hemianopsia remains present, unchanged LUE: 4-/5 proximal distal with ongoing apraxia, improving Left lower extremity: 4-4+/5 hip flexion knee extension, 4-/5 ankle dorsiflexion, improving  Assessment/Plan: 1. Functional deficits which require 3+ hours per day of interdisciplinary therapy in a comprehensive inpatient rehab setting.  Physiatrist is providing close team supervision and 24 hour management of active medical problems listed below.  Physiatrist and rehab team continue to assess barriers to discharge/monitor patient progress toward functional and medical goals   Care Tool:  Bathing    Body parts bathed by patient: Face   Body parts bathed by helper: Right arm,Left lower leg     Bathing assist Assist Level: Supervision/Verbal cueing     Upper Body Dressing/Undressing Upper body dressing   What is the patient wearing?: Pull over shirt    Upper body assist Assist Level: Minimal Assistance - Patient > 75%    Lower Body Dressing/Undressing Lower body  dressing      What is the patient wearing?: Pants,Incontinence brief     Lower body assist Assist for lower body dressing: Minimal Assistance - Patient > 75% (with brief set up like underwear)     Toileting Toileting    Toileting assist Assist for toileting: Minimal Assistance - Patient > 75%      Transfers Chair/bed transfer  Transfers assist     Chair/bed transfer assist level: Contact Guard/Touching assist     Locomotion Ambulation   Ambulation assist      Assist level: Contact Guard/Touching assist Assistive device: Walker-rolling Max distance: 153ft   Walk 10 feet activity   Assist     Assist level: Contact Guard/Touching assist Assistive device: Walker-rolling   Walk 50 feet activity   Assist    Assist level: Contact Guard/Touching assist Assistive device: Walker-rolling    Walk 150 feet activity   Assist Walk 150 feet activity did not occur: Safety/medical concerns (fatigue, L hemi, weakness, poor endurance)  Assist level: Contact Guard/Touching assist Assistive device: Walker-rolling    Walk 10 feet on uneven surface  activity   Assist Walk 10 feet on uneven surfaces activity did not occur: Safety/medical concerns (fatigue, L hemi, weakness, poor endurance)         Wheelchair     Assist Will patient use wheelchair at discharge?: Yes Type of Wheelchair: Manual    Wheelchair assist level: Supervision/Verbal cueing Max wheelchair distance: 75    Wheelchair 50 feet with 2 turns activity    Assist        Assist Level: Supervision/Verbal cueing   Wheelchair 150 feet activity     Assist      Assist Level: Maximal Assistance - Patient 25 - 49%    Medical Problem List and Plan: 1.Left side hemiparesis secondary to acute ischemic right MCA infarction status post IR revascularization  Continue CIR  2. Antithrombotics: -DVT/anticoagulation:SCDs -antiplatelet therapy: Low-dose aspirin 81 mg daily initiated 10/13/2020 3. Pain Management/chronic back pain/vascular headache:  Topamax 25 mg twice daily, increased on 2/2  Fioricet as needed  Tylenol as needed  Heart rate unable to tolerate propanolol  Elavil 10 started on 2/4, d/ced on 2/8 due to ?side effects  See #13  Controlled with meds on  2/8 4. Mood:Provide emotional support  Fluoxetine started on 2/4 DC'd on 2/7 due to? Side effects   Appears to be improving -antipsychotic agents: N/A 5. Neuropsych: This patientiscapable of making decisions on herown behalf.  Appreciate neuropsych eval-discussed mood with neuropsych 6. Skin/Wound Care:Routine skin checks 7. Fluids/Electrolytes/Nutrition:Routine in and outs. 8. COPD/tobacco abuse. NicoDerm patch.   Counsel 9.  Chronic nonischemic cardiomyopathy/systolic CHF. Ejection fraction 35%. Follow-up cardiology services Filed Weights   10/19/20 0500 10/20/20 0332 10/21/20 0535  Weight: 90.8 kg 90.7 kg 90.9 kg   Status post pacemaker  Daily weights  Stable on 2/8 10. Urge incontinence: timed voids  PVRs ordered, x1 performed, unremarkable    Improving 11.  Prediabetes  Hemoglobin A1c 6.1 on 09/2020  Controlled on 2/4 12.  Leukocytosis: Resolved  WBCs within normal limits on 2/4  Afebrile, no signs/symptoms of infection 13.  AKI  Creatinine 0.94 on 2/8  Continue to encourage fluids 14. Visual disturbance - stoke +/- medications  Trial d/c Elavil  LOS: 8 days A FACE TO FACE EVALUATION WAS PERFORMED  Carmello Cabiness Karis Juba 10/21/2020, 8:43 AM

## 2020-10-21 NOTE — Plan of Care (Signed)
  Problem: RH Bathing Goal: LTG Patient will bathe all body parts with assist levels (OT) Description: LTG: Patient will bathe all body parts with assist levels (OT) Flowsheets (Taken 10/21/2020 1452) LTG: Pt will perform bathing with assistance level/cueing: (downgraded) Supervision/Verbal cueing Note: Downgraded due to fluctuating dynamic standing balance when bathing at shower level   Problem: RH Dressing Goal: LTG Patient will perform upper body dressing (OT) Description: LTG Patient will perform upper body dressing with assist, with/without cues (OT). Flowsheets (Taken 10/21/2020 1452) LTG: Pt will perform upper body dressing with assistance level of: Independent with assistive device Goal: LTG Patient will perform lower body dressing w/assist (OT) Description: LTG: Patient will perform lower body dressing with assist, with/without cues in positioning using equipment (OT) Flowsheets (Taken 10/21/2020 1452) LTG: Pt will perform lower body dressing with assistance level of: (downgraded) Supervision/Verbal cueing Note: Downgraded due to decreased dynamic standing balance with LB dressing

## 2020-10-22 ENCOUNTER — Inpatient Hospital Stay (HOSPITAL_COMMUNITY): Payer: Medicare HMO

## 2020-10-22 DIAGNOSIS — R195 Other fecal abnormalities: Secondary | ICD-10-CM

## 2020-10-22 LAB — CBC
HCT: 46.2 % — ABNORMAL HIGH (ref 36.0–46.0)
Hemoglobin: 15 g/dL (ref 12.0–15.0)
MCH: 28.5 pg (ref 26.0–34.0)
MCHC: 32.5 g/dL (ref 30.0–36.0)
MCV: 87.7 fL (ref 80.0–100.0)
Platelets: 275 10*3/uL (ref 150–400)
RBC: 5.27 MIL/uL — ABNORMAL HIGH (ref 3.87–5.11)
RDW: 14.8 % (ref 11.5–15.5)
WBC: 6 10*3/uL (ref 4.0–10.5)
nRBC: 0 % (ref 0.0–0.2)

## 2020-10-22 MED ORDER — CALCIUM POLYCARBOPHIL 625 MG PO TABS
1250.0000 mg | ORAL_TABLET | Freq: Every day | ORAL | Status: DC
  Administered 2020-10-22 – 2020-10-24 (×3): 1250 mg via ORAL
  Filled 2020-10-22 (×3): qty 2

## 2020-10-22 MED ORDER — NICOTINE 14 MG/24HR TD PT24
14.0000 mg | MEDICATED_PATCH | Freq: Every day | TRANSDERMAL | Status: DC
  Administered 2020-10-22 – 2020-10-24 (×3): 14 mg via TRANSDERMAL
  Filled 2020-10-22 (×3): qty 1

## 2020-10-22 NOTE — Progress Notes (Signed)
[facility]ICAL [facility]ICINE [facility] REHABILITATION PROGRESS NOTE  Subjective/Complaints: Patient seen sitting up in her chair this morning.  She states she slept fairly overnight because she was restless.  She has questions regarding loose stools, headaches, nicotine patch, discharge plans.  ROS: + Loose stools.  Denies CP, SOB, N/V  Objective: Vital Signs: Blood pressure 112/72, pulse (!) 52, temperature 98 F (36.7 C), temperature source Oral, resp. rate 20, height 5\' 8"  (1.727 m), weight 90.9 kg, SpO2 98 %. No results found. No results for input(s): WBC, HGB, HCT, PLT in the last 72 hours. Recent Labs    10/21/20 0519  NA 139  K 3.5  CL 110  CO2 19*  GLUCOSE 100*  BUN 27*  CREATININE 0.94  CALCIUM 9.3    Intake/Output Summary (Last 24 hours) at 10/22/2020 12/20/2020 Last data filed at 10/21/2020 2015 Gross per 24 hour  Intake 120 ml  Output --  Net 120 ml        Physical Exam: BP 112/72   Pulse (!) 52   Temp 98 F (36.7 C) (Oral)   Resp 20   Ht 5\' 8"  (1.727 m)   Wt 90.9 kg   SpO2 98%   BMI 30.47 kg/m   Constitutional: No distress . Vital signs reviewed. HENT: Normocephalic.  Atraumatic. Eyes: EOMI. No discharge. Cardiovascular: No JVD.  RRR. Respiratory: Normal effort.  No stridor.  Bilateral clear to auscultation. GI: Non-distended.  BS +. Skin: Warm and dry.  Intact. Psych: Normal mood.  Normal behavior. Musc: No edema in extremities.  No tenderness in extremities. Neuro: Alert Demonstrates good insight and awareness.  Follows commands.  LUE: 4-/5 proximal distal with ongoing apraxia, improving Left lower extremity: 4-4+/5 hip flexion knee extension, 4-/5 ankle dorsiflexion, improving  Assessment/Plan: 1. Functional deficits which require 3+ hours per day of interdisciplinary therapy in a comprehensive inpatient rehab setting.  Physiatrist is providing close team supervision and 24 hour management of active medical problems listed below.  Physiatrist and  rehab team continue to assess barriers to discharge/monitor patient progress toward functional and medical goals   Care Tool:  Bathing    Body parts bathed by patient: Right arm,Left arm,Chest,Abdomen,Front perineal area,Buttocks,Right upper leg,Left upper leg,Right lower leg,Left lower leg,Face   Body parts bathed by helper: Right arm,Left lower leg     Bathing assist Assist Level: Set up assist     Upper Body Dressing/Undressing Upper body dressing   What is the patient wearing?: Pull over shirt    Upper body assist Assist Level: Independent with assistive device    Lower Body Dressing/Undressing Lower body dressing      What is the patient wearing?: Pants,Incontinence brief     Lower body assist Assist for lower body dressing: Minimal Assistance - Patient > 75%     Toileting Toileting    Toileting assist Assist for toileting: Supervision/Verbal cueing     Transfers Chair/bed transfer  Transfers assist     Chair/bed transfer assist level: Supervision/Verbal cueing     Locomotion Ambulation   Ambulation assist      Assist level: Supervision/Verbal cueing Assistive device: Walker-rolling Max distance: 116ft   Walk 10 feet activity   Assist     Assist level: Supervision/Verbal cueing Assistive device: Walker-rolling   Walk 50 feet activity   Assist    Assist level: Supervision/Verbal cueing Assistive device: Walker-rolling    Walk 150 feet activity   Assist Walk 150 feet activity did not occur: Safety/medical concerns (fatigue, L  hemi, weakness, poor endurance)  Assist level: Supervision/Verbal cueing Assistive device: Walker-rolling    Walk 10 feet on uneven surface  activity   Assist Walk 10 feet on uneven surfaces activity did not occur: Safety/medical concerns (fatigue, L hemi, weakness, poor endurance)         Wheelchair     Assist Will patient use wheelchair at discharge?: Yes Type of Wheelchair: Manual     Wheelchair assist level: Supervision/Verbal cueing Max wheelchair distance: 75    Wheelchair 50 feet with 2 turns activity    Assist        Assist Level: Supervision/Verbal cueing   Wheelchair 150 feet activity     Assist      Assist Level: Maximal Assistance - Patient 25 - 49%    Medical Problem List and Plan: 1.Left side hemiparesis secondary to acute ischemic right MCA infarction status post IR revascularization  Continue CIR   Team conference today to discuss current and goals and coordination of care, home and environmental barriers, and discharge planning with nursing, case manager, and therapies. Please see conference note from today as well.  2. Antithrombotics: -DVT/anticoagulation:SCDs -antiplatelet therapy: Low-dose aspirin 81 mg daily initiated 10/13/2020 3. Pain Management/chronic back pain/vascular headache:  Topamax 25 mg twice daily, increased on 2/2  Fioricet as needed  Tylenol as needed  Heart rate unable to tolerate propanolol  Elavil 10 started on 2/4, d/ced on 2/8 due to ?side effects-continue to monitor 1 more day and resume if no changes  See #13  Controlled with meds on 2/9 4. Mood:Provide emotional support  Fluoxetine started on 2/4 DC'd on 2/7 due to? Side effects   Appears to be improving -antipsychotic agents: N/A 5. Neuropsych: This patientiscapable of making decisions on herown behalf.  Appreciate neuropsych eval-discussed mood with neuropsych 6. Skin/Wound Care:Routine skin checks 7. Fluids/Electrolytes/Nutrition:Routine in and outs. 8. COPD/tobacco abuse. NicoDerm patch.   Counsel 9.  Chronic nonischemic cardiomyopathy/systolic CHF. Ejection fraction 35%. Follow-up cardiology services Filed Weights   10/19/20 0500 10/20/20 0332 10/21/20 0535  Weight: 90.8 kg 90.7 kg 90.9 kg   Status post pacemaker  Daily weights  Stable on 2/9 10. Urge incontinence: timed voids  PVRs ordered, x1 performed,  unremarkable  Improved 11.  Prediabetes  Hemoglobin A1c 6.1 on 09/2020  Controlled on 2/4 12.  Leukocytosis: Resolved  WBCs within normal limits on 2/4  Afebrile, no signs/symptoms of infection 13.  AKI  Creatinine 0.94 on 2/8  Continue to encourage fluids 14. Visual disturbance - stoke +/- medications  Trial d/c Elavil-no changes at present 15.  Loose stools  Patient believes it is not diet related  Fiber started on 2/9  KUB ordered  Colchicine DC'd  CBC ordered  LOS: 9 days A FACE TO FACE EVALUATION WAS PERFORMED  Rebecca Singh Karis Juba 10/22/2020, 9:22 AM

## 2020-10-22 NOTE — Patient Care Conference (Signed)
Inpatient RehabilitationTeam Conference and Plan of Care Update Date: 10/22/2020   Time: 11:54 AM    Patient Name: Rebecca Singh      Medical Record Number: [id number]  Date of Birth: 06/30/1956 Sex: Female         Room/Bed: 4W19C/4W19C-01 Payor Info: Payor: [employer] MEDICARE / Plan: [employer] MEDICARE HMO / Product Type: *No Product type* /    Admit Date/Time:  10/13/2020  5:04 PM  Primary Diagnosis:  Right middle cerebral artery stroke [facility])  Hospital Problems: Principal Problem:   Right middle cerebral artery stroke (HCC) Active Problems:   Leukocytosis   Urge incontinence   Chronic systolic congestive heart failure (HCC)   Vascular headache   Anxiety state   Hemiparesis affecting left side as late effect of stroke (HCC)   AKI (acute kidney injury) (HCC)   Visual disturbance   Loose stools    Expected Discharge Date: Expected Discharge Date: 10/24/20  Team Members Present: Physician leading conference: Dr. Maryla Morrow Care Coodinator Present: Chana Bode, RN, BSN, CRRN;Becky Dupree, LCSW Nurse Present: Magdalen Spatz) [place], LPN PT Present: Raechel Chute, PT OT Present: Rosalio Loud, OT PPS Coordinator present : Fae Pippin, SLP     Current Status/Progress Goal Weekly Team Focus  Bowel/Bladder   Continent of B/B, LBM 10/21/2020  Remain continent  Assess Qshift and PRN   Swallow/Nutrition/ Hydration             ADL's   CGA transfers wtih RW, bathing Supervision, Min A LB dressing (dons socks/shoes Supervision)  Mod I ADLS, Supevision IADLs - may downgrade to Supervision overall does have family present 24/7  ADL retraining, vision, LUE NMR, motor planning, transfers, dynamic standing balance, safety awareness   Mobility   bed mobility supervision, transfers with RW CGA, gait 141ft with RW CGA, 12 steps 2 rails min A  Mod I transfers, supervision gait and stairs  functional mobility/transfers, generalized strengthening, dynamic standing balance/coordination,  ambulation, NMR, endurance   Communication             Safety/Cognition/ Behavioral Observations            Pain   Complains of HA, pain not specified  Pain <3  Assess Qshift and PRN   Skin   Skin intact  Maintain Skin integrity  Assess Qshift and PRN     Discharge Planning:  HOme with family, niece coming in today for educaiton prior to discharge home friday 2/11. Set up with OP rehab at [facility]   Team Discussion: MD adjusted medications. Patient with loose bowel movements; MD ordered abdominal xray and started fiver. Patient with anxiety with shower transfers/froze in new environment.  Patient on target to meet rehab goals: yes, currently supervision - mod I goals set. On target to meet goals for discharge  *See Care Plan and progress notes for long and short-term goals.   Revisions to Treatment Plan:   Teaching Needs: Safety, transfers, shower transfers, medications, etc. Family education completed 10/22/20  Current Barriers to Discharge: None  Possible Resolutions to Barriers: Recommend wheelchair for extended distance mobility  OP follow up scheduled at [facility] Summary Current Status: Left side hemiparesis secondary to acute ischemic right MCA infarction status post IR revascularization  Barriers to Discharge: Behavior;Medical stability;Other (comments)  Barriers to Discharge Comments: Headaches Possible Resolutions to Barriers/Weekly Focus: Therapies, follow labs - WBCs, optimize headaches meds, optimize bowel meds/workup, monitor visual symptoms, follow weights   Continued Need for Acute Rehabilitation Level of  Care: The patient requires daily medical management by a physician with specialized training in physical medicine and rehabilitation for the following reasons: Direction of a multidisciplinary physical rehabilitation program to maximize functional independence : Yes Medical management of patient stability for increased activity during participation  in an intensive rehabilitation regime.: Yes Analysis of laboratory values and/or radiology reports with any subsequent need for medication adjustment and/or medical intervention. : Yes   I attest that I was present, lead the team conference, and concur with the assessment and plan of the team.   Chana Bode B 10/22/2020, 2:35 PM

## 2020-10-22 NOTE — Discharge Summary (Addendum)
Physician Discharge Summary  Patient ID: Rebecca Singh MRN: [id number] DOB/AGE: 10-26-1955 65 y.o.  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 EXAM: CT ANGIOGRAPHY HEAD AND NECK TECHNIQUE: Multidetector CT imaging of the head and neck was performed using the standard protocol during bolus administration of intravenous contrast. Multiplanar CT image reconstructions and MIPs were obtained to evaluate the vascular anatomy. Carotid stenosis measurements (when applicable) are obtained utilizing NASCET criteria, using the distal internal carotid diameter as the denominator. CONTRAST:  70mL OMNIPAQUE IOHEXOL 350 MG/ML SOLN COMPARISON:  Head CT October 07, 2020. FINDINGS: CTA NECK FINDINGS Aortic arch: Standard branching. Imaged portion shows no evidence of aneurysm or dissection. Mild atherosclerotic changes of the aortic arch without stenosis at the branch origins. Increased tortuosity of the major arch vessels likely related to longstanding hypertension. Right carotid system: Prominent tortuosity of the right common and cervical segment of right internal carotid artery. No evidence of dissection, stenosis (50% or greater) or occlusion. Left carotid system: Increased tortuosity of the proximal left common carotid artery and upper cervical  segment of the left internal carotid artery. No evidence of dissection, stenosis (50% or greater) or occlusion. Vertebral arteries: Left dominant. V1 segment of the right vertebral artery is partially obscured by streak artifact. No evidence of dissection, stenosis (50% or greater) or occlusion. Skeleton: No acute findings. Other neck: Ectopic thyroid tissue in the midline with small right and left lobes. Paratracheal lymph nodes below the thyroid gland are unchanged from prior chest CT performed 2019. Upper chest: No acute findings. Review of the MIP images confirms the above findings CTA HEAD FINDINGS Anterior circulation: Small calcified plaques in the bilateral carotid siphons without hemodynamically significant stenosis. There is a right M2/MCA posterior division branch occlusion with moderate distal branch opacification via leptomeningeal collaterals. Left MCA vascular tree in bilateral ACA vascular trees show no evidence of significant stenosis or proximal occlusion. Posterior circulation: No significant stenosis, proximal occlusion, aneurysm, or vascular malformation. Venous sinuses: As permitted by contrast timing, patent. Review of the MIP images confirms the above findings IMPRESSION: 1. Right M2/MCA posterior division branch occlusion with moderate distal branch opacification via leptomeningeal collaterals. 2. No hemodynamically significant stenosis in the neck. These results were called by telephone at the time of interpretation on 10/07/2020 at 10:26 am to provider [facility] , who verbally acknowledged these results. Aortic Atherosclerosis (ICD10-I70.0). Electronically Signed   By: Rebecca Singh M.D.   On: 10/07/2020 10:50   DG Abd 1 View  Result Date: 10/22/2020 CLINICAL DATA:  Diarrhea EXAM: ABDOMEN - 1 VIEW COMPARISON:  None. FINDINGS: Normal abdominal gas pattern. No gross free intraperitoneal gas. No organomegaly. Cholecystectomy clips noted in the right upper quadrant.  Suture anchors within the pubic symphysis likely related to bladder suspension. Multiple phleboliths noted within the pelvis. Transitional lumbar anatomy. No acute bone abnormality. IMPRESSION: Negative. Electronically Signed   By:  Rebecca Numbers MD   On: 10/22/2020 12:29   CT HEAD WO CONTRAST  Result Date: 10/19/2020 CLINICAL DATA:  Stroke.  Visual disturbance. EXAM: CT HEAD WITHOUT CONTRAST TECHNIQUE: Contiguous axial images were obtained from the base of the skull through the vertex without intravenous contrast. COMPARISON:  10/13/2020 FINDINGS: Brain: No new or progressive finding. Areas of acute infarction in the right middle cerebral artery territory affecting the insula and frontoparietal junction cortical and subcortical brain are becoming less apparent. No longer any hyperdense material in the sylvian fissure. No evidence of mass effect or hemorrhage. The remainder the brain appears normal. No hydrocephalus or extra-axial collection Vascular: No acute vascular finding. There is atherosclerotic calcification of the major vessels at the base of the brain. Skull: Negative Sinuses/Orbits: Clear/normal Other: None IMPRESSION: No new or progressive finding. Areas of acute infarction in the right middle cerebral artery territory are becoming less apparent. No longer any hyperdense material in the Sylvian fissure. Electronically Signed   By: Rebecca Singh M.D.   On: 10/19/2020 20:26   CT HEAD WO CONTRAST  Result Date: 10/13/2020 CLINICAL DATA:  Follow-up examination for acute stroke. EXAM: CT HEAD WITHOUT CONTRAST TECHNIQUE: Contiguous axial images were obtained from the base of the skull through the vertex without intravenous contrast. COMPARISON:  Prior head CT from 10/09/2020 as well as earlier studies. FINDINGS: Brain: Continued interval evolution of patchy right MCA distribution infarcts involving the right insula and overlying posterior right frontal parietal cortex, stable in size and distribution as  compared to previous exam. No evidence for hemorrhagic transformation or significant regional mass effect. Hyperdensity within the right sylvian fissure is decreased from previous exam, likely a small amount of persistent subarachnoid hemorrhage. No new intracranial hemorrhage or large vessel territory infarct. No mass lesion, mass effect, or midline shift. No hydrocephalus or extra-axial fluid collection. Vascular: Decreasing hyperdensity at the right sylvian fissure without definite new hyperdense vessel. Skull: Scalp soft tissues and calvarium within normal limits. Sinuses/Orbits: Globes and orbital soft tissues demonstrate no acute finding. Paranasal sinuses and mastoid air cells remain clear. Other: None. IMPRESSION: 1. Continued interval evolution of patchy right MCA distribution infarcts, stable in size and distribution as compared to previous exam. No evidence for hemorrhagic transformation or significant regional mass effect. 2. Persistent but decreased hyperdensity within the right Sylvian fissure, likely a small amount of subarachnoid hemorrhage. 3. No other new acute intracranial abnormality. Electronically Signed   By: Rebecca Singh M.D.   On: 10/13/2020 01:36   CT HEAD WO CONTRAST  Addendum Date: 10/09/2020   ADDENDUM REPORT: 10/09/2020 13:45 ADDENDUM: These results were called by telephone at the time of interpretation on 10/09/2020 at 1:45 pm to provider Rebecca Singh , who verbally acknowledged these results. Electronically Signed   By: Stana Bunting M.D.   On: 10/09/2020 13:45   Result Date: 10/09/2020 CLINICAL DATA:  Stroke, follow up EXAM: CT HEAD WITHOUT CONTRAST TECHNIQUE: Contiguous axial images were obtained from the base of the skull through the vertex without intravenous contrast. COMPARISON:  10/07/2020 and prior. FINDINGS: Brain: Hyperdense material along the right sylvian fissure is unchanged. Hypodense regions involving the right frontoparietal and right temporal lobes  are concerning for subacute infarcts. No midline shift, mass lesion or ventriculomegaly. Vascular: No definite hyperdense vessel on this exam. No unexpected calcification. Skull: Negative for fracture or focal lesion. Sinuses/Orbits: Normal orbits. Clear paranasal sinuses. No mastoid effusion. Other: None. IMPRESSION: Subacute right frontoparietal and right temporal infarcts. Hyperdense material within the  right sylvian fissure is unchanged. Electronically Signed: By: Stana Bunting M.D. On: 10/09/2020 13:34   CT HEAD WO CONTRAST  Addendum Date: 10/07/2020   ADDENDUM REPORT: 10/07/2020 18:54 ADDENDUM: The described small volume hyperdensity within the right sylvian fissure appears similar to the head CT performed earlier today at 12:07 p.m. immediately following the revascularization procedure. These results were called by telephone at the time of interpretation on 10/07/2020 at 6:54 pm to provider Dr. Iver Nestle, who verbally acknowledged these results. Electronically Signed   By: Rebecca Loge DO   On: 10/07/2020 18:54   Result Date: 10/07/2020 CLINICAL DATA:  Stroke, follow-up. EXAM: CT HEAD WITHOUT CONTRAST TECHNIQUE: Contiguous axial images were obtained from the base of the skull through the vertex without intravenous contrast. COMPARISON:  Noncontrast head CT performed earlier today 10/07/2020, CT angiogram head/neck 10/07/2020. FINDINGS: Brain: Cerebral volume is normal. New from the prior examination, there is hyperdensity within the right sylvian fissure which may reflect extravasated contrast and/or subarachnoid hemorrhage. Possible subtle acute infarction changes within the right frontoparietal lobe were better appreciated on the noncontrast head CT performed formed earlier today No evidence of intracranial mass. No midline shift or hydrocephalus. Vascular: Rebecca occluded right M2 MCA posterior division branch was appreciated on the CT a performed earlier today. Atherosclerotic calcifications Skull:  Normal. Negative for fracture or focal lesion. Sinuses/Orbits: Visualized orbits show no acute finding. Mild bilateral ethmoid sinus mucosal thickening. IMPRESSION: New from the noncontrast head CT performed earlier today, there is small volume hyperdensity within the right sylvian fissure which may reflect extravasated contrast and/or subarachnoid hemorrhage. Possible subtle acute infarction changes within the right frontoparietal lobe were better appreciated on the exam earlier today. Electronically Signed: By: Rebecca Loge DO On: 10/07/2020 18:42   CT Code Stroke CTA Neck W/WO contrast  Result Date: 10/07/2020 CLINICAL DATA:  Neuro deficit, acute, stroke suspected. EXAM: CT ANGIOGRAPHY HEAD AND NECK TECHNIQUE: Multidetector CT imaging of the head and neck was performed using the standard protocol during bolus administration of intravenous contrast. Multiplanar CT image reconstructions and MIPs were obtained to evaluate the vascular anatomy. Carotid stenosis measurements (when applicable) are obtained utilizing NASCET criteria, using the distal internal carotid diameter as the denominator. CONTRAST:  55mL OMNIPAQUE IOHEXOL 350 MG/ML SOLN COMPARISON:  Head CT October 07, 2020. FINDINGS: CTA NECK FINDINGS Aortic arch: Standard branching. Imaged portion shows no evidence of aneurysm or dissection. Mild atherosclerotic changes of the aortic arch without stenosis at the branch origins. Increased tortuosity of the major arch vessels likely related to longstanding hypertension. Right carotid system: Prominent tortuosity of the right common and cervical segment of right internal carotid artery. No evidence of dissection, stenosis (50% or greater) or occlusion. Left carotid system: Increased tortuosity of the proximal left common carotid artery and upper cervical segment of the left internal carotid artery. No evidence of dissection, stenosis (50% or greater) or occlusion. Vertebral arteries: Left dominant. V1 segment  of the right vertebral artery is partially obscured by streak artifact. No evidence of dissection, stenosis (50% or greater) or occlusion. Skeleton: No acute findings. Other neck: Ectopic thyroid tissue in the midline with small right and left lobes. Paratracheal lymph nodes below the thyroid gland are unchanged from prior chest CT performed 2019. Upper chest: No acute findings. Review of the MIP images confirms the above findings CTA HEAD FINDINGS Anterior circulation: Small calcified plaques in the bilateral carotid siphons without hemodynamically significant stenosis. There is a right M2/MCA posterior division branch occlusion with moderate  distal branch opacification via leptomeningeal collaterals. Left MCA vascular tree in bilateral ACA vascular trees show no evidence of significant stenosis or proximal occlusion. Posterior circulation: No significant stenosis, proximal occlusion, aneurysm, or vascular malformation. Venous sinuses: As permitted by contrast timing, patent. Review of the MIP images confirms the above findings IMPRESSION: 1. Right M2/MCA posterior division branch occlusion with moderate distal branch opacification via leptomeningeal collaterals. 2. No hemodynamically significant stenosis in the neck. These results were called by telephone at the time of interpretation on 10/07/2020 at 10:26 am to provider [facility] , who verbally acknowledged these results. Aortic Atherosclerosis (ICD10-I70.0). Electronically Signed   By: Rebecca Singh M.D.   On: 10/07/2020 10:50   IR CT Head Ltd  Result Date: 10/17/2020 Narrative & Impression INDICATION: New onset of left-sided weakness, paresthesias, left facial droop, right gaze deviation. Occluded inferior division of the right middle cerebral artery M2 segment on CT angiogram of the head and neck.   EXAM: 1. EMERGENT LARGE VESSEL OCCLUSION THROMBOLYSIS (anterior CIRCULATION)   COMPARISON:  CT angiogram of the head and neck of October 07, 2020.   MEDICATIONS: Ancef 2 g IV antibiotic was administered within 1 hour of the procedure.   ANESTHESIA/SEDATION: General anesthesia   CONTRAST:  Isovue 300 approximately 100 mL   FLUOROSCOPY TIME:  Fluoroscopy Time: 28 minutes 36 seconds (1263 mGy).   COMPLICATIONS: None immediate.   TECHNIQUE: Following a full explanation of the procedure along with the potential associated complications, Rebecca informed witnessed consent was obtained. The risks of intracranial hemorrhage of 10%, worsening neurological deficit, ventilator dependency, death and inability to revascularize were all reviewed in detail with the patient's family.   The patient was then put under general anesthesia by the Department of Anesthesiology at [facility].   The right groin was prepped and draped in the usual sterile fashion. Thereafter using modified Seldinger technique, transfemoral access into the right common femoral artery was obtained without difficulty. Over a 0.035 inch guidewire Rebecca 8 French 25 cm Pinnacle sheath was inserted. Through this, and also over a 0.035 inch guidewire a 5.5 mm support a Berenstein catheter inside of Rebecca 087 balloon guide was advanced to the right common carotid artery. The guidewire, and the support catheter were removed. Good aspiration obtained from the hub of the balloon guide at the origin of the right internal carotid artery.   FINDINGS: The right common arteriogram demonstrates the right external carotid artery and its major branches to be widely patent.   The right internal carotid artery at the bulb to the cranial skull base opacifies widely with moderate tortuosity involving the proximal half of the right internal carotid artery.   More distally the petrous, cavernous and supraclinoid segments demonstrate wide patency.   The right anterior cerebral artery and the right posterior communicating arteries opacify into the capillary and venous phases. The right middle cerebral artery in the M1  segment is widely patent.   There is occlusion with attenuation of the inferior division of the right middle cerebral artery.   PROCEDURE: Through the balloon guide catheter at the origin the right internal carotid artery, the combination of a Headway 021 microcatheter inside of a Zoom 071 136 cm aspiration catheter was advanced over a 0.014 inch standard Synchro micro guidewire with a J configuration to the supraclinoid right ICA.   The micro guidewire was then gently manipulated with a torque device and advanced through the occluded dominant inferior division into M2 M3 region  followed by the microcatheter. The guidewire was removed. Good aspiration obtained from the hub of the microcatheter. A gentle control arteriogram performed through the microcatheter demonstrated safe position of the tip of the microcatheter, which was then connected to continuous heparinized saline infusion.   A 5 mm x 37 mm Embotrap retrieval device was advanced to the distal end of the microcatheter. The O ring on the delivery microcatheter was then loosened. With slight forward gentle traction with the right hand on the delivery micro guidewire, with the left hand the delivery microcatheter was retrieved, deploying the retrieval device.   At this time, the 071 aspiration catheter was advanced into the distal right M1 segment to be position just at the orifice of the inferior division of the right middle cerebral artery.   With proximal flow arrest in the right internal carotid artery by advancing the balloon catheter into the distal cervical right ICA, and constant aspiration being applied at the hub of the aspiration catheter over approximately 2-1/2 minutes, the combination of the retrieval device, the microcatheter, the aspiration device and the Zoom aspiration catheter were retrieved and removed. Two small clots were seen in the canister reservoir. Following reversal of mild flow arrest in the right internal carotid artery, a control  arteriogram performed through the balloon guide demonstrated opacification of the right middle cerebral inferior division and of the superior division. A TICI 2c revascularization of the right middle cerebral artery territory was achieved. This also unmasked the attenuated right MCA trifurcation branches. On the arterial projection the late arterial and capillary phase, a triangular area of hypoperfusion which was noted in the right parietal region the area described on the CT scan as having be infarcted.   Spasm was seen more proximally in the right internal carotid artery which responded to 2 aliquots of 25 mcg of nitroglycerin intra-arterially.   A control arteriogram performed with the balloon guide now at the origin of the right internal carotid artery demonstrated patency of the right internal carotid artery intracranially and extracranially. The right posterior communicating artery with subsequent opacification of the right posterior cerebral artery distribution remained patent.   The right anterior cerebral artery was also widely patent.   The right middle cerebral artery a demonstrated continued TICI 2c revascularization.   The balloon guide was then retrieved and removed. Rebecca 8 French Angio-Seal closure device was applied for hemostasis at the right groin puncture site. Distal pulses remained palpable in the dorsalis pedis, and the posterior tibial regions bilaterally unchanged.   A CT scan on the table demonstrated a small focal amount of hyperdensity in the right perisylvian region anteriorly and extending slightly posteriorly. No evidence of mass effect or midline shift was noted.   Patient was then extubated. Upon slow recovery, the patient was able to move the right-side normally. On the left side, the patient was able to the lift her arm above the table, and her left lower extremity against gravity.   She was able to follow simple commands.   Patient was then transferred to the PACU and then neuro ICU  to continue with post thrombectomy care.   IMPRESSION: Status post endovascular revascularization of the inferior division of the right middle cerebral artery M2 segment with 1 pass with a 5 mm x 37 mm Embotrap retrieval device and proximal aspiration achieving a TICI 2c revascularization.   PLAN: Follow-up as per referring MD.     Electronically Signed   By: Rebecca Cotton M.D.   On: 10/08/2020  16:35   DG CHEST PORT 1 VIEW  Result Date: 10/12/2020 CLINICAL DATA:  Chest pain. EXAM: PORTABLE CHEST 1 VIEW COMPARISON:  11/16/2017 FINDINGS: Persistent low lung volumes. Multi lead left-sided pacemaker in place. Mild cardiomegaly stable. Unchanged mediastinal contours. Aortic atherosclerosis. Scarring at the right lung base unchanged. No acute airspace disease. No pleural fluid or pneumothorax. Remote right rib fracture again seen. No acute osseous abnormalities. IMPRESSION: Low lung volumes without acute abnormality. Electronically Signed   By: Rebecca Singh M.D.   On: 10/12/2020 21:14   ECHOCARDIOGRAM COMPLETE  Result Date: 10/09/2020    ECHOCARDIOGRAM REPORT   Patient Name:   Rebecca Singh Date of Exam: 10/09/2020 Medical Rec #:  [id number]        Height:       66.0 in Accession #:    [id number]       Weight:       216.0 lb Date of Birth:  09-Nov-1955        BSA:          2.067 m Patient Age:    64 years         BP:           119/68 mmHg Patient Gender: F                HR:           72 bpm. Exam Location:  Inpatient Procedure: 2D Echo, Cardiac Doppler and Color Doppler Indications:    Stroke 434.91 / I163.9  History:        Patient has prior history of Echocardiogram examinations, most                 recent 11/16/2017. CHF, CAD and Previous Myocardial Infarction,                 Defibrillator, COPD; Risk Factors:Hypertension and Current                 Smoker. Nonischemic cardiomyopathy.  Sonographer:    Rebecca Singh RDCS Referring Phys: [id number] Rebecca Singh IMPRESSIONS  1. Left ventricular  ejection fraction, by estimation, is 30 to 35%. The left ventricle has moderately decreased function. The left ventricle demonstrates global hypokinesis. The left ventricular internal cavity size was mildly dilated. There is mild left ventricular hypertrophy. Left ventricular diastolic parameters are consistent with Grade I diastolic dysfunction (impaired relaxation).  2. Right ventricular systolic function is mildly reduced. The right ventricular size is normal. Tricuspid regurgitation signal is inadequate for assessing PA pressure.  3. Left atrial size was mildly dilated.  4. The mitral valve is normal in structure. No evidence of mitral valve regurgitation. No evidence of mitral stenosis.  5. The aortic valve is tricuspid. Aortic valve regurgitation is mild. No aortic stenosis is present.  6. The inferior vena cava is normal in size with greater than 50% respiratory variability, suggesting right atrial pressure of 3 mmHg. FINDINGS  Left Ventricle: Left ventricular ejection fraction, by estimation, is 30 to 35%. The left ventricle has moderately decreased function. The left ventricle demonstrates global hypokinesis. The left ventricular internal cavity size was mildly dilated. There is mild left ventricular hypertrophy. Left ventricular diastolic parameters are consistent with Grade I diastolic dysfunction (impaired relaxation). Right Ventricle: The right ventricular size is normal. No increase in right ventricular wall thickness. Right ventricular systolic function is mildly reduced. Tricuspid regurgitation signal is inadequate for assessing PA pressure. Left Atrium: Left atrial size was mildly dilated. Right Atrium:  Right atrial size was normal in size. Pericardium: Trivial pericardial effusion is present. Mitral Valve: The mitral valve is normal in structure. Mild mitral annular calcification. No evidence of mitral valve regurgitation. No evidence of mitral valve stenosis. Tricuspid Valve: The tricuspid valve  is normal in structure. Tricuspid valve regurgitation is not demonstrated. Aortic Valve: The aortic valve is tricuspid. Aortic valve regurgitation is mild. No aortic stenosis is present. Pulmonic Valve: The pulmonic valve was normal in structure. Pulmonic valve regurgitation is not visualized. Aorta: The aortic root is normal in size and structure. Venous: The inferior vena cava is normal in size with greater than 50% respiratory variability, suggesting right atrial pressure of 3 mmHg. IAS/Shunts: No atrial level shunt detected by color flow Doppler. Additional Comments: A pacer wire is visualized in the right ventricle.  LEFT VENTRICLE PLAX 2D LVIDd:         5.70 cm      Diastology LVIDs:         4.90 cm      LV e' medial:    3.05 cm/s LV PW:         1.20 cm      LV E/e' medial:  11.1 LV IVS:        1.30 cm      LV e' lateral:   4.24 cm/s LVOT diam:     2.20 cm      LV E/e' lateral: 8.0 LV SV:         52 LV SV Index:   25 LVOT Area:     3.80 cm  LV Volumes (MOD) LV vol d, MOD A2C: 159.0 ml LV vol d, MOD A4C: 196.0 ml LV vol s, MOD A2C: 88.5 ml LV vol s, MOD A4C: 116.0 ml LV SV MOD A2C:     70.5 ml LV SV MOD A4C:     196.0 ml LV SV MOD BP:      70.7 ml LEFT ATRIUM             Index       RIGHT ATRIUM           Index LA diam:        4.50 cm 2.18 cm/m  RA Area:     12.10 cm LA Vol (A2C):   54.6 ml 26.42 ml/m RA Volume:   24.00 ml  11.61 ml/m LA Vol (A4C):   52.3 ml 25.31 ml/m LA Biplane Vol: 54.0 ml 26.13 ml/m  AORTIC VALVE LVOT Vmax:   84.20 cm/s LVOT Vmean:  62.400 cm/s LVOT VTI:    0.136 m  AORTA Ao Root diam: 3.70 cm Ao Asc diam:  3.50 cm MITRAL VALVE MV Area (PHT): 2.13 cm    SHUNTS MV Decel Time: 356 msec    Systemic VTI:  0.14 m MV E velocity: 33.80 cm/s  Systemic Diam: 2.20 cm MV A velocity: 56.10 cm/s MV E/A ratio:  0.60 Marca Ancona MD Electronically signed by Marca Ancona MD Signature Date/Time: 10/09/2020/11:32:51 PM    Final    IR PERCUTANEOUS ART THROMBECTOMY/INFUSION INTRACRANIAL INC DIAG  ANGIO  Result Date: 10/09/2020 INDICATION: New onset of left-sided weakness, paresthesias, left facial droop, right gaze deviation. Occluded inferior division of the right middle cerebral artery M2 segment on CT angiogram of the head and neck. EXAM: 1. EMERGENT LARGE VESSEL OCCLUSION THROMBOLYSIS (anterior CIRCULATION) COMPARISON:  CT angiogram of the head and neck of October 07, 2020. MEDICATIONS: Ancef 2 g IV antibiotic was administered within 1  hour of the procedure. ANESTHESIA/SEDATION: General anesthesia CONTRAST:  Isovue 300 approximately 100 mL FLUOROSCOPY TIME:  Fluoroscopy Time: 28 minutes 36 seconds (1263 mGy). COMPLICATIONS: None immediate. TECHNIQUE: Following a full explanation of the procedure along with the potential associated complications, Rebecca informed witnessed consent was obtained. The risks of intracranial hemorrhage of 10%, worsening neurological deficit, ventilator dependency, death and inability to revascularize were all reviewed in detail with the patient's family. The patient was then put under general anesthesia by the Department of Anesthes[facility] at [facility]. The right groin was prepped and draped in the usual sterile fashion. Thereafter using modified Seldinger technique, transfemoral access into the right common femoral artery was obtained without difficulty. Over a 0.035 inch guidewire Rebecca 8 French 25 cm Pinnacle sheath was inserted. Through this, and also over a 0.035 inch guidewire a 5.5 mm support a Berenstein catheter inside of Rebecca 087 balloon guide was advanced to the right common carotid artery. The guidewire, and the support catheter were removed. Good aspiration obtained from the hub of the balloon guide at the origin of the right internal carotid artery. FINDINGS: The right common arteriogram demonstrates the right external carotid artery and its major branches to be widely patent. The right internal carotid artery at the bulb to the cranial skull base opacifies  widely with moderate tortuosity involving the proximal half of the right internal carotid artery. More distally the petrous, cavernous and supraclinoid segments demonstrate wide patency. The right anterior cerebral artery and the right posterior communicating arteries opacify into the capillary and venous phases. The right middle cerebral artery in the M1 segment is widely patent. There is occlusion with attenuation of the inferior division of the right middle cerebral artery. PROCEDURE: Through the balloon guide catheter at the origin the right internal carotid artery, the combination of a Headway 021 microcatheter inside of a Zoom 071 136 cm aspiration catheter was advanced over a 0.014 inch standard Synchro micro guidewire with a J configuration to the supraclinoid right ICA. The micro guidewire was then gently manipulated with a torque device and advanced through the occluded dominant inferior division into M2 M3 region followed by the microcatheter. The guidewire was removed. Good aspiration obtained from the hub of the microcatheter. A gentle control arteriogram performed through the microcatheter demonstrated safe position of the tip of the microcatheter, which was then connected to continuous heparinized saline infusion. A 5 mm x 37 mm Embotrap retrieval device was advanced to the distal end of the microcatheter. The O ring on the delivery microcatheter was then loosened. With slight forward gentle traction with the right hand on the delivery micro guidewire, with the left hand the delivery microcatheter was retrieved, deploying the retrieval device. At this time, the 071 aspiration catheter was advanced into the distal right M1 segment to be position just at the orifice of the inferior division of the right middle cerebral artery. With proximal flow arrest in the right internal carotid artery by advancing the balloon catheter into the distal cervical right ICA, and constant aspiration being applied at the  hub of the aspiration catheter over approximately 2-1/2 minutes, the combination of the retrieval device, the microcatheter, the aspiration device and the Zoom aspiration catheter were retrieved and removed. Two small clots were seen in the canister reservoir. Following reversal of mild flow arrest in the right internal carotid artery, a control arteriogram performed through the balloon guide demonstrated opacification of the right middle cerebral inferior division and of the superior division. A TICI  2c revascularization of the right middle cerebral artery territory was achieved. This also unmasked the attenuated right MCA trifurcation branches. On the arterial projection the late arterial and capillary phase, a triangular area of hypoperfusion which was noted in the right parietal region the area described on the CT scan as having be infarcted. Spasm was seen more proximally in the right internal carotid artery which responded to 2 aliquots of 25 mcg of nitroglycerin intra-arterially. A control arteriogram performed with the balloon guide now at the origin of the right internal carotid artery demonstrated patency of the right internal carotid artery intracranially and extracranially. The right posterior communicating artery with subsequent opacification of the right posterior cerebral artery distribution remained patent. The right anterior cerebral artery was also widely patent. The right middle cerebral artery a demonstrated continued TICI 2c revascularization. The balloon guide was then retrieved and removed. Rebecca 8 French Angio-Seal closure device was applied for hemostasis at the right groin puncture site. Distal pulses remained palpable in the dorsalis pedis, and the posterior tibial regions bilaterally unchanged. A CT scan on the table demonstrated a small focal amount of hyperdensity in the right perisylvian region anteriorly and extending slightly posteriorly. No evidence of mass effect or midline shift was  noted. Patient was then extubated. Upon slow recovery, the patient was able to move the right-side normally. On the left side, the patient was able to the lift her arm above the table, and her left lower extremity against gravity. She was able to follow simple commands. Patient was then transferred to the PACU and then neuro ICU to continue with post thrombectomy care. IMPRESSION: Status post endovascular revascularization of the inferior division of the right middle cerebral artery M2 segment with 1 pass with a 5 mm x 37 mm Embotrap retrieval device and proximal aspiration achieving a TICI 2c revascularization. PLAN: Follow-up as per referring MD. Electronically Signed   By: Rebecca CottonSanjeev  Singh M.D.   On: 10/08/2020 16:35   CT HEAD CODE STROKE WO CONTRAST  Result Date: 10/07/2020 CLINICAL DATA:  Code stroke.    Left-sided weakness.  Slurred speech EXAM: CT HEAD WITHOUT CONTRAST TECHNIQUE: Contiguous axial images were obtained from the base of the skull through the vertex without intravenous contrast. COMPARISON:  None. FINDINGS: Brain: Small hypodensity right frontal parietal lobe near the motor cortex. Probable acute infarct. Negative for hemorrhage Ventricle size normal.  Negative for mass lesion. Vascular: Suspect hyperdense vessel right M2 /M3 region in the right sylvian fissure. Skull: Negative Sinuses/Orbits: Paranasal sinuses clear.  Negative orbit Other: None ASPECTS (Alberta Stroke Program Early CT Score) - Ganglionic level infarction (caudate, lentiform nuclei, internal capsule, insula, M1-M3 cortex): 6 - Supraganglionic infarction (M4-M6 cortex): 3 Total score (0-10 with 10 being normal): 9 IMPRESSION: 1. Hypodensity right frontal parietal lobe suspicious for acute infarct given the symptoms. In addition, there is a probable small hyperdense vessel in the right sylvian fissure involving the right middle cerebral artery. 2. ASPECTS is 9 3. These results were called by telephone at the time of  interpretation on 10/07/2020 at 10:12 am to [facility] , who verbally acknowledged these results. 4. Code stroke imaging results were communicated on 10/07/2020 at 10:12 am to provider [facility] via text page Electronically Signed   By: Rebecca Palauharles  Clark M.D.   On: 10/07/2020 10:13    Labs:  Basic Metabolic Panel: Recent Labs  Lab 10/17/20 0607 10/21/20 0519  NA 138 139  K 3.9 3.5  CL 111 110  CO2 18*  19*  GLUCOSE 98 100*  BUN 32* 27*  CREATININE 1.14* 0.94  CALCIUM 9.0 9.3    CBC: Recent Labs  Lab 10/17/20 0607 10/22/20 1054  WBC 5.6 6.0  NEUTROABS 2.6  --   HGB 14.1 15.0  HCT 44.0 46.2*  MCV 88.7 87.7  PLT 279 275    CBG: No results for input(s): GLUCAP in the last 168 hours.  Family history.  Positive for hypertension and hyperlipidemia.  Denies any colon cancer esophageal cancer or rectal cancer  Brief HPI:   Rebecca [facility] is a 65 y.o. 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  Per chart review patient lives with son and grandchildren sister and elderly father.  Independent with assistive device.  Mobile home 3 steps to entry.  Presented 10/07/2020 with acute onset of left-sided weakness.  Noted blood pressure in the 190s.  Admission chemistries unremarkable except glucose 109 creatinine 1.03 SARS coronavirus negative troponin 24, urine drug screen negative.  CT of the head showed hypodensity right frontal parietal lobe suspicious for acute infarction in addition there was a probable small hyperdense vessel in the right sylvian fissure involving the right MCA.  CT angiogram of head and neck right M2/MCA posterior division branch occlusion with moderate distal branch opacification.  No hemodynamically significant  stenosis in the neck.  Patient underwent revascularization per interventional radiology.  Follow-up CT of the head showed small volume hyperdensity within the right sylvian fissure reflecting extravasated contrast and/or subarachnoid hemorrhage.  Possible subtle acute infarcts changes in the right frontal parietal lobe.  Echocardiogram with ejection fraction of 30 to 35% grade 1 diastolic dysfunction.  Follow-up cranial CT scan 10/13/2020 showed continued interval evolution of patchy right MCA distribution infarct stable in size and distribution as compared to prior exam.  No evidence of hemorrhagic transformation or significant mass-effect.  Persistent but decreased hyperdensity within the right sylvian fissure likely a small amount of subarachnoid blood.  Patient was cleared to begin low-dose aspirin for CVA prophylaxis.  Cardiology service consulted for both ICD interrogation for atrial fibrillation as well as nonspecific chest discomfort.  EKG completed similar to distant prior EKG tracings.  EKG consistent with myopericarditis for which she takes high-dose nonsteroids and colchicine in the past and currently completing a course of Solu-Medrol.  Troponin completed 24-17-20-22.  Chest discomfort felt to be more pleuritic in nature.  Cardiology did sign off no further work-up indicated.  Due to patient's left-sided weakness decreased functional mobility she was admitted for a comprehensive rehab program.   [facility] Course: ANIE[facility] was admitted to rehab 10/13/2020 for inpatient therapies to consist of PT, ST and OT at least three hours five days a week. Past admission physiatrist, therapy team and rehab RN have worked together to provide customized collaborative inpatient rehab.  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  Pain management chronic pain with headaches Topamax as  indicated.  She had also been placed on low-dose Elavil but discontinued due to some visual disturbances.  Mood stabilization with emotional support provided.  COPD tobacco abuse NicoDerm patch receiving counsel guard cessation of nicotine products.  Chronic nonischemic cardiomyopathy systolic congestive heart failure ejection fraction 35% follow-up cardiology services.  She had no further  bouts of chest pain or shortness of breath.  Prediabetes hemoglobin A1c 6.4 blood sugars controlled.   Blood pressures were monitored on TID basis and controlled  Diabetes has been monitored with ac/hs CBG checks and SSI was use prn for tighter BS control.    Rehab course: During patient's stay in rehab weekly team conferences were held to monitor patient's progress, set goals and discuss barriers to discharge. At admission, patient required minimal assist 20 feet rolling walker minimal assist sit to stand max assist sit to supine.  Moderate assist grooming mod assist upper body dressing max is lower body dressing +2 physical assist toilet transfers  Physical exam.  Blood pressure 127/66 pulse 54 temperature 97.7 respirations 16 oxygen saturation 95% room air Constitutional.  No acute distress HEENT Head.  Normocephalic and atraumatic Eyes.  Pupils round and reactive to light no discharge.nystagmus Neck.  Supple nontender no JVD without thyromegaly Cardiac regular rate rhythm any extra sounds or murmur heard Abdomen.  Soft nontender positive bowel sounds not rebound Respiratory effort normal no respiratory distress without wheeze Skin.  Warm and dry Neurologic.  Alert no acute distress.  Oriented to person place and time.  Follows commands.  Fair insight and awareness.  Left homonymous hemianopsia.  She did have some blurred vision without diplopia.  Speech was clear.  Left upper extremity apraxic but grossly 3-4/5.  Left lower extremity apraxic 2-3/5 inconsistent effort.  Sensation 1/2 left face arm and leg  DTRs 2+  He/She  has had improvement in activity tolerance, balance, postural control as well as ability to compensate for deficits. He/She has had improvement in functional use RUE/LUE  and RLE/LLE as well as improvement in awareness.  Patient transferred supine to sitting edge of bed head of bed elevated use of bed rail supervision.  Don and doff nonskid socks supervision.  Ambulates 150 feet rolling walker close supervision.  Worked on dynamic standing balance tapping each lower extremity to 3 cones for 3 repetitions x2 trials with bilateral upper extremity support rolling walker contact-guard assist.  Patient did demonstrate some decreased motor planning with reaching for and then attempting to toss ball but decrease in effort holding her breath needed some encouragement at times to participate.  Patient ambulates around the room with rolling walker supervision to gather all items for ADLs.  Demonstrated improved use of left upper extremity with managing rolling walker.  Patient toileted prior to showering and doff clothing at toilet.  Ambulates into the shower completed bathing and overall seated position lateral leans to wash buttocks.  Full family teaching completed plan discharge to home       Disposition: Discharged to home    Diet: Regular  Special Instructions: No driving smoking or alcohol  Medications at discharge 1.  Tylenol as needed 2.  Aspirin 81 mg p.o. daily 3.  Fioricet 2 tablets every 8 hours as needed headache 4.  Colchicine 0.6 mg p.o. twice daily 5.  NicoDerm patch taper as directed 6.  Protonix 40 mg p.o. daily 7.  Topamax 50 mg p.o. twice daily 8.  Imodium 2 mg daily   30-35 minutes were spent completing discharge summary and discharge planning  Discharge Instructions     Ambulatory referral to Neurology   Complete by: As directed    Rebecca appointment is requested in approximately 4 weeks right MCA infarction   Ambulatory referral to Occupational Therapy    Complete by: As directed    Eval and treat   Ambulatory referral to  Physical Medicine Rehab   Complete by: As directed    Moderate complexity follow-up 1 to 2 weeks right MCA infarction   Ambulatory referral to Physical Therapy   Complete by: As directed    Eval and treat        Follow-up Information     Marcello Fennel, MD Follow up.   Specialty: Physical Medicine and Rehabilitation Why: Office to call for appointment Contact information: [address] [place] [address] [phone]         Linton Rump, MD Follow up.   Specialty: Cardiology Why: Call for appointment Contact information: [address] [place] [address] [place] [address] [phone]                 Signed: Charlton Amor 10/23/2020, 5:18 AM Patient was seen, face-face, and physical exam performed by me on day of discharge, greater than 30 minutes of total time spent.. Please see progress note from day of discharge as well.  Maryla Morrow, MD, ABPMR

## 2020-10-22 NOTE — Progress Notes (Signed)
Physical Therapy Session Note  Patient Details  Name: Rebecca [facility] MRN: [id number] Date of Birth: Oct 26, 1955  Today's Date: 10/22/2020 PT Individual Time: 1025-8527 PT Individual Time Calculation (min): 40 min   Short Term Goals: Week 2:  PT Short Term Goal 1 (Week 1): STG=LTG due to LOS  Skilled Therapeutic Interventions/Progress Updates:    Patient in w/c receiving meds with RN, then visit with MD.  Patient reports continued blurry vision in L eye.  Patient sit to stand to RW with CGA.  Ambulated with CGA to therapy gym x 130'.  Cue throughout for forward gaze, softer footfall, core activation and posture.    Patient performed standing coordination/pre-gait activity stepping over and back hockey stick with UE support on RW and cues for alternating for weight shifts.  Performed side steps over hockey stick with increased time, mod cues for spacing for feet and noted L LE shaking. Patient ended activity with fatigue and frustration.  Encouragement provided as therapy meant to challenge with difficult tasks to improve daily tasks (she stated "I never have to step sideways.")    Patient ambulated in gym x 110' with RW and close S/CGA with improved stride length and control as previously in session.  Negotiated 8 steps with bilateral rails(6" steps) with min A. Patient ambulated back to room 130' with RW and CGA cues again for core activation, forward gaze, foot clearance and softer footfall.  Patient left up in w/c with chair alarm on, call bell and needs in reach.  Therapy Documentation Precautions:  Precautions Precautions: None Precaution Comments: L hemi, L inattention Restrictions Weight Bearing Restrictions: No Pain: Pain Assessment Pain Scale: 0-10 Faces Pain Scale: No hurt Pain Intervention(s): Repositioned    Therapy/Group: Individual Therapy  Elray Mcgregor  [place], PT 10/22/2020, 8:40 AM

## 2020-10-22 NOTE — Progress Notes (Signed)
Physical Therapy Session Note  Patient Details  Name: Rebecca [facility] MRN: [id number] Date of Birth: [birth year]  Today's Date: 10/22/2020 PT Individual Time: 5374-8270 PT Individual Time Calculation (min): 32 min   Today's Date: 10/22/2020 PT Missed Time: 13 Minutes Missed Time Reason: Patient fatigue  Short Term Goals: Week 1:  PT Short Term Goal 1 (Week 1): STG=LTG due to LOS  Skilled Therapeutic Interventions/Progress Updates:   Received pt sitting in WC finishing OT family education session, pt agreeable to therapy, and denied any pain during session. Pt's niece, [name], present for family education training. RN present to administer medication and laboratory present to draw blood. Session with emphasis on discharge planning, functional mobility/transfers, generalized strengthening, dynamic standing balance/coordination, ambulation, stair navigation, simulated car transfers, and improved activity tolerance. Pt transported to therapy gym in [facility] total A for time management purposes and navigated 4 steps with 2 rails and CGA/min A provided by [facility] ascending and descending with a step to pattern. Educated [place] on hand placement and body positioning when navigating stairs and pt has tendency to step down heavily with LLE due to weakness. Holly verbalized and demonstrated appropriate mechanics for safe technique. In ortho gym, pt performed ambulatory simulated car transfer with RW and close supervision provided by [facility]. Pt initially anxious stating "I can't remember how to do it". Holly able to calm pt and provide encouragement. Pt required use of BUE to lift LLE into car due to weakness. Educated [place] on importance of standing on pt's "weak" side for safety. Pt then ambulated 53ft with RW and close supervision provided by [facility]. Pt reported feeling extremely fatigued and requested to sit. Pt requesting to have WC upon D/C for community distances; CSW notified. Pt transported back to room in [facility] total  A. Concluded session with pt sitting in [facility], needs within reach, and niece present at bedside. 13 minutes missed of skilled physical therapy due to fatigue.   Therapy Documentation Precautions:  Precautions Precautions: None Precaution Comments: L hemi, L inattention Restrictions Weight Bearing Restrictions: No  Therapy/Group: Individual Therapy Martin Majestic PT, DPT   10/22/2020, 7:24 AM

## 2020-10-22 NOTE — Progress Notes (Signed)
Occupational Therapy Discharge Summary  Patient Details  Name: Rebecca Singh MRN: [id number] Date of Birth: 1955-10-16   Patient has met 12 of 12 long term goals due to improved activity tolerance, improved balance, postural control, ability to compensate for deficits, functional use of  LEFT upper and LEFT lower extremity, improved awareness and improved coordination.  Patient to discharge at overall Supervision level.  Patient's care partner is independent to provide the necessary physical and cognitive assistance at discharge.  Patient's niece, [name], present for family education and demonstrates ability to provide the close supervision for mobility and provides clear directions when pt becomes increasingly anxious with mobility.  Reasons goals not met: na  Recommendation:  Patient will benefit from ongoing skilled OT services in outpatient setting to continue to advance functional skills in the area of BADL and Reduce care partner burden.  Equipment: No equipment provided  Reasons for discharge: treatment goals met and discharge from hospital  Patient/family agrees with progress made and goals achieved: Yes  OT Discharge Precautions/Restrictions  Precautions Precautions: Fall Precaution Comments: L hemi Restrictions Weight Bearing Restrictions: No General   Vital Signs Therapy Vitals Temp: 97.6 F (36.4 C) Pulse Rate: (!) 54 Resp: 16 BP: 126/81 Patient Position (if appropriate): Sitting Oxygen Therapy SpO2: 100 % O2 Device: Room Air    ADL ADL Eating: Independent Where Assessed-Eating: Chair Grooming: Modified independent Where Assessed-Grooming: Sitting at sink Upper Body Bathing: Modified independent Where Assessed-Upper Body Bathing: Shower Lower Body Bathing: Supervision/safety Where Assessed-Lower Body Bathing: Shower Upper Body Dressing: Modified independent (Device) Where Assessed-Upper Body Dressing: Chair Lower Body Dressing:  Supervision/safety Where Assessed-Lower Body Dressing: Chair Toileting: Supervision/safety Where Assessed-Toileting: Glass blower/designer: Close supervision Toilet Transfer Method: Counselling psychologist: Grab bars,Raised Counselling psychologist: Close supervision Social research officer, government Method: Heritage manager: Gaffer Baseline Vision/History: Wears glasses Wears Glasses: Reading only Eye Alignment: Within Functional Limits Ocular Range of Motion: Within Functional Limits Tracking/Visual Pursuits: Able to track stimulus in all quads without difficulty Saccades: Decreased speed of saccadic movement   BITS:  1 minute, reaction time in stance:  R = 1.94 sec, L = 5.84 sec Saccades letters A-Z in stance using right hand:  2:05, reaction time 4.8 sec Perception  Perception: Impaired Praxis Praxis: Impaired Praxis Impairment Details: Motor planning Cognition Overall Cognitive Status: Within Functional Limits for tasks assessed Arousal/Alertness: Awake/alert Orientation Level: Oriented X4 Safety/Judgment: Appears intact Sensation Sensation Peripheral sensation comments: impaired left hand Proprioception: Appears Intact Coordination Gross Motor Movements are Fluid and Coordinated: No Fine Motor Movements are Fluid and Coordinated: No Coordination and Movement Description: mild uncoordination due to L hemi, decreased balance/postural control, generalized weakness, and decreased endurance. Finger Nose Finger Test: WFL on RUE, decreased speed on LUE 9 Hole Peg Test: R = 31 sec, L = 3 min, 8 sec     box and blocks:  R = 39, L = 2 Motor  Motor Motor: Abnormal postural alignment and control;Hemiplegia Motor - Skilled Clinical Observations: mild uncoordination due to mild L hemi, decreased balance/postural control, generalized weakness, and decreased endurance   Mobility  Bed Mobility Rolling Right: Independent with  assistive device Rolling Left: Independent with assistive device Supine to Sit: Independent with assistive device Sit to Supine: Independent with assistive device Transfers Sit to Stand: Independent with assistive device  Balance Static Sitting Balance Static Sitting - Level of Assistance: 7: Independent Dynamic Sitting Balance Dynamic Sitting - Level of Assistance: 6: Modified independent (  Device/Increase time) Static Standing Balance Static Standing - Level of Assistance: 6: Modified independent (Device/Increase time) Dynamic Standing Balance Dynamic Standing - Level of Assistance: 5: Stand by assistance Extremity/Trunk Assessment RUE Assessment RUE Assessment: Within Functional Limits General Strength Comments: grossly 5/5 LUE Assessment Active Range of Motion (AROM) Comments: proximal reach 3/4, distal full although inconsistent use of left hand (motor apraxia?)   Carlos Levering 10/23/2020, 4:48 PM

## 2020-10-22 NOTE — Progress Notes (Signed)
Patient ID: Rebecca Singh, female   DOB: 06-11-1956, 65 y.o.   MRN: [id number]  Met with pt and niece who is here for education to discuss team conference progress toward goals of mod/i-supervision level. Preparing for discharge 2/11. Have ordered a wheelchair via [place] via [facility]. Work toward discharge Friday.

## 2020-10-22 NOTE — Progress Notes (Signed)
Occupational Therapy Session Note  Patient Details  Name: Rebecca Singh MRN: [id number] Date of Birth: 1955-12-07  Today's Date: 10/22/2020 OT Individual Time: 7619-5093 and 1401-1501 OT Individual Time Calculation (min): 58 min and 60 min   Short Term Goals: Week 1:  OT Short Term Goal 1 (Week 1): STG = LTGs due to ELOS  Skilled Therapeutic Interventions/Progress Updates:    1) Treatment session with focus on hands on family education with pt and pt's niece, [name].  Pt received upright in w/c reporting frustration with MD due to changes in medications and reports not sleeping well overnight.  Pt agreeable to family education session.  Pt transported to ADL apt with engage in simulated walk-in shower transfers and toilet transfers.  Therapist educated on technique to transfer in to home walk-in shower with ~3" ledge.  When pt attempting transfer, she "froze" and began trembling stating "I can't do it" and "I don't know" with inability to move forward.  Therefore therapist encouraged pt to sit in w/c.  Therapist again demonstrated transfer with RW and compared transfer to toilet and car transfers.  Pt completed transfer x1 with increased time and mod encouragement, pt demonstrating difficulty when backing up and lifting LLE over ledge.  Pt able to step forward (when exiting shower) with CGA with RW and increased sequencing.  Pt completed toilet transfers, ambulating in to bathroom with RW, with close supervision.  Pt's niece provided supervision with ambulation and toilet transfers.  Therapist provided pt with fine motor control handout and theraputty handout, educating on functional tasks to incorporate various exercises into function.  Largely due to increased anxiety with shower transfers, pt reporting extreme fatigue.  Due to time constraints, and increased time spent on shower transfer, pt returned to room and passed off to PT.    2) Treatment session with focus on hands on education with pt's  niece.  Pt received upright in w/c reporting fatigue but agreeable to therapy session.  Returned to ADL apt to complete shower transfers again as per AM session.  With increased familiarity pt able to complete transfer x2 with close supervision and min cues for sequencing and problem solving as pt continues to have difficulty when backing up in preparation to stepping over shower ledge.  Pt demonstrating improved sequencing with transfers and less anxiety this session.  Engaged in LUE NMR in sitting at table top with focus on fine motor activities to complete upon d/c.  Utilized Connect 4 and playing cards with focus on picking items up from table, stacking, and placing on vertical surface to increase active ROM.  Pt continues to utilize gross grasp and demonstrates decreased dexterity, but able to complete tasks with increased time and effort.  Therapy Documentation Precautions:  Precautions Precautions: None Precaution Comments: L hemi, L inattention Restrictions Weight Bearing Restrictions: No General: General PT Missed Treatment Reason: Patient fatigue Vital Signs: Therapy Vitals Temp: 98.8 F (37.1 C) Temp Source: Oral Pulse Rate: (!) 59 Resp: 18 BP: 111/72 Patient Position (if appropriate): Sitting Oxygen Therapy SpO2: 98 % O2 Device: Room Air Pain:  1) Pt with no c/o pain  2) Pt with c/o headache, not rated. Premedicated.   Therapy/Group: Individual Therapy  Rosalio Loud 10/22/2020, 3:20 PM

## 2020-10-22 NOTE — Plan of Care (Signed)
  Problem: RH Simple Meal Prep Goal: LTG Patient will perform simple meal prep w/assist (OT) Description: LTG: Patient will perform simple meal prep with assistance, with/without cues (OT). Outcome: Not Applicable Note: D/c as not a focus at this time.  Discussed with pt and niece ways to maintain engaged in meal prep tasks, but this therapist does not recommend pt complete any cooking at this time.

## 2020-10-23 MED ORDER — BUTALBITAL-APAP-CAFFEINE 50-325-40 MG PO TABS: 2.0000 | ORAL_TABLET | Freq: Three times a day (TID) | ORAL | 0 refills | Status: DC | PRN

## 2020-10-23 MED ORDER — ACETAMINOPHEN 325 MG PO TABS: 650.0000 mg | ORAL_TABLET | ORAL | Status: DC | PRN

## 2020-10-23 MED ORDER — LOPERAMIDE HCL 2 MG PO CAPS
2.0000 mg | ORAL_CAPSULE | Freq: Every day | ORAL | Status: DC
Start: 2020-10-23 — End: 2020-10-24
  Administered 2020-10-23 – 2020-10-24 (×2): 2 mg via ORAL
  Filled 2020-10-23 (×2): qty 1

## 2020-10-23 MED ORDER — TOPIRAMATE 50 MG PO TABS: 50.0000 mg | ORAL_TABLET | Freq: Two times a day (BID) | ORAL | 0 refills | Status: DC

## 2020-10-23 MED ORDER — PANTOPRAZOLE SODIUM 40 MG PO TBEC: 40.0000 mg | DELAYED_RELEASE_TABLET | Freq: Every day | ORAL | 0 refills | Status: DC

## 2020-10-23 MED ORDER — ASPIRIN 81 MG PO TBEC: 81.0000 mg | DELAYED_RELEASE_TABLET | Freq: Every day | ORAL | 11 refills | Status: DC

## 2020-10-23 MED ORDER — NICOTINE 14 MG/24HR TD PT24: MEDICATED_PATCH | TRANSDERMAL | 0 refills | Status: DC

## 2020-10-23 MED ORDER — CALCIUM POLYCARBOPHIL 625 MG PO TABS: 1250.0000 mg | ORAL_TABLET | Freq: Every day | ORAL | Status: DC

## 2020-10-23 NOTE — Progress Notes (Signed)
Recreational Therapy Session Note  Patient Details  Name: CATTIE TINEO MRN: [id number] Date of Birth: August 22, 1956 Today's Date: 10/23/2020  Pain: no c/o Skilled Therapeutic Interventions/Progress Updates: Session focused on activity analysis with identification of potential modifications with supervision/cues. Discussed the various domains of health including social, emotional, & spiritual and their impact on each other.   Therapy/Group: Individual TherapySIMPSON,Feliz Lincoln 10/23/2020, 3:43 PM

## 2020-10-23 NOTE — Progress Notes (Signed)
Occupational Therapy Session Note  Patient Details  Name: Rebecca Singh MRN: [id number] Date of Birth: March 22, 1956  Today's Date: 10/23/2020 OT Individual Time: 1000-1100   &   1400-1500 OT Individual Time Calculation (min): 60 min    &   60 minutes   Short Term Goals: Week 1:  OT Short Term Goal 1 (Week 1): STG = LTGs due to ELOS  Skilled Therapeutic Interventions/Progress Updates:    AM session:   Patient in bed, alert and ready for therapy session.  She denies pain and requests a shower this am.  Supine to sitting edge of bed with CS.  Sit to stand and ambulation with RW to/from bed, toilet, shower bench, arm chair, w/c with CS.  She was able to gather clothing items needed with CS using RW - occ cues for safe technique.  She completed toileting with CS, min cues.  Able to doff clothing mod I.  Shower completed seated on shower bench with CS.  Dressing completed seated on bedside commode in bathroom with set up/CS and increased time.  She completed grooming tasks mod I w/c level at sink.  She remained seated in w/c at close of session, call bell and tray table in reach.     PM session:   Patient seated in w/c, she states that she is ready for last therapy session and ready to go home tomorrow - although she states that she will miss the staff [facility] at [facility].  Completed reassessment for discharge to include box and blocks, 9 hole peg test, various visual scanning/reaction time assessments with BITS system (see discharge summary for details)   She walked 150 feet x3 requiring CS with RW to start but cues for rest break and occ CGA with fatigue.  Reviewed energy conservation techniques and safety with mobility.  Reviewed safety with kitchen tasks - provided suggestions and showed her pictures of spike cutting board and onion holder as options upon return home.  She returned to bed at close of session, bed alarm set and call bell in hand.       Therapy Documentation Precautions:   Precautions Precautions: Fall Precaution Comments: L hemi Restrictions Weight Bearing Restrictions: No  Therapy/Group: Individual Therapy  Barrie Lyme 10/23/2020, 7:37 AM

## 2020-10-23 NOTE — Progress Notes (Signed)
Physical Therapy Discharge Summary  Patient Details  Name: Rebecca Singh MRN: [id number] Date of Birth: [birth year]  Today's Date: 10/23/2020 PT Individual Time: 0800-0911 PT Individual Time Calculation (min): 71 min   Patient has met 8 of 8 long term goals due to improved activity tolerance, improved balance, improved postural control, increased strength, decreased pain, improved attention, improved awareness and improved coordination. Patient to discharge at an ambulatory level Supervision.  Patient's care partner is independent to provide the necessary physical assistance at discharge. Pt's niece, [name], attended family education on 2/9 and verbalized and demonstrated confidence with all tasks to ensure safe discharge home.   All goals met  Recommendation:  Patient will benefit from ongoing skilled PT services in outpatient setting to continue to advance safe functional mobility, address ongoing impairments in transfers, generalized strengthening, dynamic standing balance/coordination, gait training, NMR, endurance, and to minimize fall risk.  Equipment: 20x18 manual WC, pt already has RW  Reasons for discharge: treatment goals met  Patient/family agrees with progress made and goals achieved: Yes  Today's Interventions: Received pt supine in bed, pt agreeable to therapy, and denied any pain during session but reported having a "restless" night. Session with emphasis on discharge planning, functional mobility/transfers, generalized strengthening, dynamic standing balance/coordinaiton, ambulation, stair navigation, simulated car transfers, and improved activity tolerance. Pt performed bed mobility mod I and donned socks and shoes mod I with increased time. Pt stood and ambulated to sink with RW and supervision and stood and brushed teeth mod I. Pt performed WC mobility 151f using L hemi technique with supervision and increased time to therapy gym. Pt navigated 12 steps with 2 rails and  ascending and descending with a step to pattern with CGA. Pt required heavy CGA when descending stairs due to tendency to "plop" down with LLE due to poor eccentric control. Pt required multiple extended rest breaks throughout session due to fatigue and low energy levels. Discussed energy conservation strategies to use at home. Pt [facility] and ambulated 170fon uneven surfaces (ramp) with RW and close supervision with cues to increase L foot clearance. Pt able to stand and pick up cup from floor with RW and heavy CGA for balance. Pt ambulated 15059fith RW and close supervision. Pt demonstrates improvements in LLE foot clearance and heel strike however with fatigue demonstrates increased L foot drag. Pt transported back to [facility] and ambulated [id number] with RW and supervision to bathroom. Pt able to manage clothing, void, and perform peri-care with distant supervision. Pt stood at sink and washed hands with supervision/mod I. Pt ambulated to recliner with RW and supervision. Concluded session with pt sitting in recliner, needs within reach, and chair pad alarm on.   PT Discharge Precautions/Restrictions Precautions Precautions: Fall Precaution Comments: L hemi Restrictions Weight Bearing Restrictions: No Cognition Overall Cognitive Status: Within Functional Limits for tasks assessed Arousal/Alertness: Awake/alert Orientation Level: Oriented X4 Memory: Appears intact Awareness: Appears intact Problem Solving: Appears intact Safety/Judgment: Appears intact Sensation Sensation Light Touch: Impaired by gross assessment Proprioception: Appears Intact Additional Comments: decreased sensation along LLE compared to RLE Coordination Gross Motor Movements are Fluid and Coordinated: No Fine Motor Movements are Fluid and Coordinated: No Coordination and Movement Description: mild uncoordination due to L hemi, decreased balance/postural control, generalized  weakness, and decreased endurance. Finger Nose Finger Test: WFL on RUE, decreased speed on LUE Heel Shin Test: WFL [facility], slow but improved ROM on LLE Motor  Motor Motor: Abnormal postural alignment and control;Hemiplegia Motor - Skilled Clinical Observations: mild uncoordination due to mild L hemi, decreased balance/postural control, generalized weakness, and decreased endurance  Mobility Bed Mobility Bed Mobility: Rolling Right;Rolling Left;Sit to Supine;Supine to Sit Rolling Right: Independent with assistive device Rolling Left: Independent with assistive device Supine to Sit: Independent with assistive device Sit to Supine: Independent with assistive device Transfers Transfers: Sit to Stand;Stand to Sit;Stand Pivot Transfers Sit to Stand: Independent with assistive device Stand to Sit: Independent with assistive device Stand Pivot Transfers: Independent with assistive device Stand Pivot Transfer Details: Other (comment) Stand Pivot Transfer Details (indicate cue type and reason): none Transfer (Assistive device): Rolling walker Locomotion  Gait Ambulation: Yes Gait Assistance: Supervision/Verbal cueing Gait Distance (Feet): 150 Feet Assistive device: Rolling walker Gait Assistance Details: Verbal cues for gait pattern;Verbal cues for safe use of DME/AE Gait Assistance Details: verbal cues to ensure L hand grip on RW and occasional cues to increase L step length Gait Gait: Yes Gait Pattern: Impaired Gait Pattern: Step-to pattern;Decreased stride length;Decreased dorsiflexion - left;Decreased trunk rotation;Wide base of support;Shuffle;Decreased weight shift to left;Decreased step length - left;Decreased step length - right;Trunk flexed;Poor foot clearance - left;Poor foot clearance - right;Decreased stance time - left Gait velocity: decreased Stairs / Additional Locomotion Stairs: Yes Stairs Assistance: Contact Guard/Touching assist Stair Management Technique: Two  rails Number of Stairs: 12 Height of Stairs: 6 Ramp: Supervision/Verbal cueing (RW) Product manager Mobility: Yes Wheelchair Assistance: Chartered loss adjuster: Right upper extremity;Right lower extremity Wheelchair Parts Management: Needs assistance Distance: 168f  Trunk/Postural Assessment  Cervical Assessment Cervical Assessment: Within Functional Limits Thoracic Assessment Thoracic Assessment: Exceptions to [facility](rounded shoulders) Lumbar Assessment Lumbar Assessment: Exceptions to [facility](posterior pelvic tilt) Postural Control Postural Control: Deficits on evaluation  Balance Balance Balance Assessed: Yes Static Sitting Balance Static Sitting - Balance Support: Feet supported;No upper extremity supported Static Sitting - Level of Assistance: 7: Independent Dynamic Sitting Balance Dynamic Sitting - Balance Support: Feet supported;No upper extremity supported Dynamic Sitting - Level of Assistance: 6: Modified independent (Device/Increase time) Static Standing Balance Static Standing - Balance Support: Bilateral upper extremity supported (RW) Static Standing - Level of Assistance: 6: Modified independent (Device/Increase time) Dynamic Standing Balance Dynamic Standing - Balance Support: Bilateral upper extremity supported (RW) Dynamic Standing - Level of Assistance: 6: Modified independent (Device/Increase time) Dynamic Standing - Comments: with transfers Extremity Assessment  RLE Assessment RLE Assessment: Exceptions to WHigh[facility] HospitalGeneral Strength Comments: grossly generalized to 4-/5; pt with difficulty concentrating LLE Assessment LLE Assessment: Exceptions to WThe Center For Plastic [facility] Reconstructive SurgeryLLE Strength Left Hip Flexion: 3+/5 Left Hip ABduction: 3+/5 Left Hip ADduction: 3+/5 Left Knee Flexion: 3/5 Left Knee Extension: 3/5 Left Ankle Dorsiflexion: 3/5 Left Ankle Plantar Flexion: 3/5  Leighla Chestnutt M JLyn HollingsheadPT, DPT  10/23/2020, 7:45 AM

## 2020-10-23 NOTE — Progress Notes (Signed)
[place] PHYSICAL MEDICINE & REHABILITATION PROGRESS NOTE  Subjective/Complaints: Patient seen sitting up in her chair, working with therapies this AM.  She states she slept fairly overnight because she was restless.  She notes improvement in headaches, no changes in stool or visual disturbance.  ROS: + Loose stools, visual disturbance, unchanged.  Denies CP, SOB, N/V  Objective: Vital Signs: Blood pressure 120/83, pulse (!) 59, temperature 98 F (36.7 C), temperature source Oral, resp. rate 17, height 5\' 8"  (1.727 m), weight 90.1 kg, SpO2 98 %. DG Abd 1 View  Result Date: 10/22/2020 CLINICAL DATA:  Diarrhea EXAM: ABDOMEN - 1 VIEW COMPARISON:  None. FINDINGS: Normal abdominal gas pattern. No gross free intraperitoneal gas. No organomegaly. Cholecystectomy clips noted in the right upper quadrant. Suture anchors within the pubic symphysis likely related to bladder suspension. Multiple phleboliths noted within the pelvis. Transitional lumbar anatomy. No acute bone abnormality. IMPRESSION: Negative. Electronically Signed   By: 12/20/2020 MD   On: 10/22/2020 12:29   Recent Labs    10/22/20 1054  WBC 6.0  HGB 15.0  HCT 46.2*  PLT 275   Recent Labs    10/21/20 0519  NA 139  K 3.5  CL 110  CO2 19*  GLUCOSE 100*  BUN 27*  CREATININE 0.94  CALCIUM 9.3    Intake/Output Summary (Last 24 hours) at 10/23/2020 1338 Last data filed at 10/23/2020 0845 Gross per 24 hour  Intake 200 ml  Output --  Net 200 ml        Physical Exam: BP 120/83 (BP Location: Right Arm)   Pulse (!) 59   Temp 98 F (36.7 C) (Oral)   Resp 17   Ht 5\' 8"  (1.727 m)   Wt 90.1 kg   SpO2 98%   BMI 30.20 kg/m   Constitutional: No distress . Vital signs reviewed. HENT: Normocephalic.  Atraumatic. Eyes: EOMI. No discharge. Cardiovascular: No JVD.  RRR. Respiratory: Normal effort.  No stridor.  Bilateral clear to auscultation. GI: Non-distended.  BS +. Skin: Warm and dry.  Intact. Psych: Normal mood.   Normal behavior. Musc: No edema in extremities.  No tenderness in extremities. Neuro: Alert Demonstrates good insight and awareness.  Follows commands.  LUE: 4-/5 proximal distal with ongoing apraxia, stable Left lower extremity: 4-4+/5 hip flexion knee extension, 4-/5 ankle dorsiflexion, improving  Assessment/Plan: 1. Functional deficits which require 3+ hours per day of interdisciplinary therapy in a comprehensive inpatient rehab setting.  Physiatrist is providing close team supervision and 24 hour management of active medical problems listed below.  Physiatrist and rehab team continue to assess barriers to discharge/monitor patient progress toward functional and medical goals   Care Tool:  Bathing    Body parts bathed by patient: Right arm,Left arm,Chest,Abdomen,Front perineal area,Buttocks,Right upper leg,Left upper leg,Right lower leg,Left lower leg,Face   Body parts bathed by helper: Right arm,Left lower leg     Bathing assist Assist Level: Set up assist     Upper Body Dressing/Undressing Upper body dressing   What is the patient wearing?: Pull over shirt    Upper body assist Assist Level: Independent with assistive device    Lower Body Dressing/Undressing Lower body dressing      What is the patient wearing?: Pants,Incontinence brief     Lower body assist Assist for lower body dressing: Minimal Assistance - Patient > 75%     Toileting Toileting    Toileting assist Assist for toileting: Supervision/Verbal cueing     Transfers Chair/bed transfer  Transfers assist     Chair/bed transfer assist level: Independent with assistive device Chair/bed transfer assistive device: Arboriculturist assist      Assist level: Supervision/Verbal cueing Assistive device: Walker-rolling Max distance: 149ft   Walk 10 feet activity   Assist     Assist level: Supervision/Verbal cueing Assistive device: Walker-rolling   Walk 50  feet activity   Assist    Assist level: Supervision/Verbal cueing Assistive device: Walker-rolling    Walk 150 feet activity   Assist Walk 150 feet activity did not occur: Safety/medical concerns (fatigue, L hemi, weakness, poor endurance)  Assist level: Supervision/Verbal cueing Assistive device: Walker-rolling    Walk 10 feet on uneven surface  activity   Assist Walk 10 feet on uneven surfaces activity did not occur: Safety/medical concerns (fatigue, L hemi, weakness, poor endurance)   Assist level: Supervision/Verbal cueing Assistive device: Photographer Will patient use wheelchair at discharge?: Yes Type of Wheelchair: Manual    Wheelchair assist level: Supervision/Verbal cueing Max wheelchair distance: 149ft    Wheelchair 50 feet with 2 turns activity    Assist        Assist Level: Supervision/Verbal cueing   Wheelchair 150 feet activity     Assist      Assist Level: Supervision/Verbal cueing    Medical Problem List and Plan: 1.Left side hemiparesis secondary to acute ischemic right MCA infarction status post IR revascularization  Continue CIR  2. Antithrombotics: -DVT/anticoagulation:SCDs -antiplatelet therapy: Low-dose aspirin 81 mg daily initiated 10/13/2020 3. Pain Management/chronic back pain/vascular headache:  Topamax 25 mg twice daily, increased on 2/2  Fioricet as needed  Tylenol as needed  Heart rate unable to tolerate propanolol  Elavil 10 started on 2/4, d/ced on 2/8  See #13  Controlled with meds on 2/10 4. Mood:Provide emotional support  Fluoxetine started on 2/4 DC'd on 2/7 due to? Side effects  Appears to be improving -antipsychotic agents: N/A 5. Neuropsych: This patientiscapable of making decisions on herown behalf.  Appreciate neuropsych eval-discussed mood with neuropsych 6. Skin/Wound Care:Routine skin checks 7. Fluids/Electrolytes/Nutrition:Routine  in and outs. 8. COPD/tobacco abuse. NicoDerm patch.   Counsel 9.  Chronic nonischemic cardiomyopathy/systolic CHF. Ejection fraction 35%. Follow-up cardiology services Filed Weights   10/20/20 0332 10/21/20 0535 10/23/20 0350  Weight: 90.7 kg 90.9 kg 90.1 kg   Status post pacemaker  Daily weights  Stable on 2/10 10. Urge incontinence: timed voids  PVRs ordered, x1 performed, unremarkable  Improved 11.  Prediabetes  Hemoglobin A1c 6.1 on 09/2020  Controlled on 2/4 12.  Leukocytosis: Resolved  WBCs within normal limits on 2/4  Afebrile, no signs/symptoms of infection 13.  AKI  Creatinine 0.94 on 2/8  Continue to encourage fluids 14. Visual disturbance - stoke +/- medications  Trial d/c Elavil  Unchanged 15.  Loose stools - notes this started and persistent since admission to hospital  Patient believes it is not diet related  Fiber started on 2/9  KUB unremarkable  Colchicine DC'd - discussed with pharmacy, pt states she has never been on this medication PTA  CBC unremarkable  Imodium daily ordered  LOS: 10 days A FACE TO FACE EVALUATION WAS PERFORMED  Ifeoluwa Beller Karis Juba 10/23/2020, 1:38 PM

## 2020-10-23 NOTE — Progress Notes (Signed)
Inpatient Rehabilitation Care Coordinator Discharge Note  The overall goal for the admission was met for:   Discharge location: Yes-HOME WITH FAMILY WHO WILL PROVIDE 24 HR SUPERVISION  Length of Stay: Yes-11 DAYS  Discharge activity level: Yes-SUPERVISION LEVEL  Home/community participation: Yes  Services provided included: MD, RD, PT, OT, RN, CM, Pharmacy, Neuropsych and SW  Financial Services: Medicaid and Private Insurance: [employer] offered to/list presented to:YES  Follow-up services arranged: Outpatient: [facility]-PT & OT WILL CALL TO SET UP APPOINTMENTS, DME: [facility] and Patient/Family has no preference for HH/DME agencies  Comments (or additional information):NIECE CAME IN FOR EDUCATION AND IT WENT WELL. PT WILL HAVE 24/7 CARE AT DISCHARGE  Patient/Family verbalized understanding of follow-up arrangements: Yes  Individual responsible for coordination of the follow-up plan: SELF [phone]  Confirmed correct DME delivered: Rebecca Singh 10/23/2020    Rebecca Singh

## 2020-10-24 MED ORDER — LOPERAMIDE HCL 2 MG PO CAPS: 2.0000 mg | ORAL_CAPSULE | Freq: Every day | ORAL | 0 refills | Status: DC

## 2020-10-24 NOTE — Progress Notes (Signed)
Recreational Therapy Discharge Summary Patient Details  Name: Rebecca Singh MRN: [id number] Date of Birth: [birth year] Today's Date: 10/24/2020  Long term goals set: 1  Long term goals met: 1  Comments on progress toward goals: Pt has made good progress during LOS and is discharging home with family to provide 24 hour supervision.  TR sessions focused on discussing holistic health (social, emotional, spiritual, cognitive in addition to physical).  Also discussed activity analysis with identification of potential modifications in meeting pts wellness needs.  Pt required supervision level cues.   Reasons for discharge: discharge from hospital  Patient/family agrees with progress made and goals achieved: Yes  Shalayne Leach 10/24/2020, 10:20 AM

## 2020-10-24 NOTE — Progress Notes (Signed)
[place] PHYSICAL MEDICINE & REHABILITATION PROGRESS NOTE  Subjective/Complaints: Patient seen laying in bed this morning.  She states she slept fairly overnight due to being disturbed by maintenance fluid for now.  She notes her vision is stable.  She notes improvement in headaches and bowel movement.  She is ready for discharge.  ROS: Visual disturbance, unchanged, headaches and bowel movements improved..  Denies CP, SOB, N/V  Objective: Vital Signs: Blood pressure 108/64, pulse (!) 56, temperature 97.6 F (36.4 C), temperature source Oral, resp. rate 16, height 5\' 8"  (1.727 m), weight 90.2 kg, SpO2 97 %. DG Abd 1 View  Result Date: 10/22/2020 CLINICAL DATA:  Diarrhea EXAM: ABDOMEN - 1 VIEW COMPARISON:  None. FINDINGS: Normal abdominal gas pattern. No gross free intraperitoneal gas. No organomegaly. Cholecystectomy clips noted in the right upper quadrant. Suture anchors within the pubic symphysis likely related to bladder suspension. Multiple phleboliths noted within the pelvis. Transitional lumbar anatomy. No acute bone abnormality. IMPRESSION: Negative. Electronically Signed   By: 12/20/2020 MD   On: 10/22/2020 12:29   Recent Labs    10/22/20 1054  WBC 6.0  HGB 15.0  HCT 46.2*  PLT 275   No results for input(s): NA, K, CL, CO2, GLUCOSE, BUN, CREATININE, CALCIUM in the last 72 hours.  Intake/Output Summary (Last 24 hours) at 10/24/2020 1014 Last data filed at 10/23/2020 1814 Gross per 24 hour  Intake 780 ml  Output --  Net 780 ml        Physical Exam: BP 108/64 (BP Location: Left Arm)   Pulse (!) 56   Temp 97.6 F (36.4 C) (Oral)   Resp 16   Ht 5\' 8"  (1.727 m)   Wt 90.2 kg   SpO2 97%   BMI 30.24 kg/m   Constitutional: No distress . Vital signs reviewed. HENT: Normocephalic.  Atraumatic. Eyes: EOMI. No discharge. Cardiovascular: No JVD.  RRR. Respiratory: Normal effort.  No stridor.  Bilateral clear to auscultation. GI: Non-distended.  BS +. Skin: Warm and  dry.  Intact. Psych: Normal mood.  Normal behavior. Musc: No edema in extremities.  No tenderness in extremities. Neuro: Alert Demonstrates good insight and awareness.  Follows commands.  LUE: 4/5 proximal distal with ongoing apraxia Left lower extremity: 4-4+/5 hip flexion knee extension, 4/5 ankle dorsiflexion  Assessment/Plan: 1. Functional deficits which require 3+ hours per day of interdisciplinary therapy in a comprehensive inpatient rehab setting.  Physiatrist is providing close team supervision and 24 hour management of active medical problems listed below.  Physiatrist and rehab team continue to assess barriers to discharge/monitor patient progress toward functional and medical goals   Care Tool:  Bathing    Body parts bathed by patient: Right arm,Left arm,Chest,Abdomen,Front perineal area,Buttocks,Right upper leg,Left upper leg,Right lower leg,Left lower leg,Face   Body parts bathed by helper: Right arm,Left lower leg     Bathing assist Assist Level: Supervision/Verbal cueing     Upper Body Dressing/Undressing Upper body dressing   What is the patient wearing?: Pull over shirt    Upper body assist Assist Level: Independent with assistive device    Lower Body Dressing/Undressing Lower body dressing      What is the patient wearing?: Pants,Incontinence brief     Lower body assist Assist for lower body dressing: Supervision/Verbal cueing     Toileting Toileting    Toileting assist Assist for toileting: Supervision/Verbal cueing     Transfers Chair/bed transfer  Transfers assist     Chair/bed transfer assist level: Independent  with assistive device Chair/bed transfer assistive device: Arboriculturist assist      Assist level: Supervision/Verbal cueing Assistive device: Walker-rolling Max distance: 172ft   Walk 10 feet activity   Assist     Assist level: Supervision/Verbal cueing Assistive device:  Walker-rolling   Walk 50 feet activity   Assist    Assist level: Supervision/Verbal cueing Assistive device: Walker-rolling    Walk 150 feet activity   Assist Walk 150 feet activity did not occur: Safety/medical concerns (fatigue, L hemi, weakness, poor endurance)  Assist level: Supervision/Verbal cueing Assistive device: Walker-rolling    Walk 10 feet on uneven surface  activity   Assist Walk 10 feet on uneven surfaces activity did not occur: Safety/medical concerns (fatigue, L hemi, weakness, poor endurance)   Assist level: Supervision/Verbal cueing Assistive device: Photographer Will patient use wheelchair at discharge?: Yes Type of Wheelchair: Manual    Wheelchair assist level: Supervision/Verbal cueing Max wheelchair distance: 130ft    Wheelchair 50 feet with 2 turns activity    Assist        Assist Level: Supervision/Verbal cueing   Wheelchair 150 feet activity     Assist      Assist Level: Supervision/Verbal cueing    Medical Problem List and Plan: 1.Left side hemiparesis secondary to acute ischemic right MCA infarction status post IR revascularization  DC today  Will see patient for transitional care management in 1-2 weeks post-discharge 2. Antithrombotics: -DVT/anticoagulation:SCDs -antiplatelet therapy: Low-dose aspirin 81 mg daily initiated 10/13/2020 3. Pain Management/chronic back pain/vascular headache:  Topamax 25 mg twice daily, increased on 2/2  Fioricet as needed  Tylenol as needed  Heart rate unable to tolerate propanolol  Elavil 10 started on 2/4, d/ced on 2/8  See #13  Controlled with meds on 2/11 4. Mood:Provide emotional support  Fluoxetine started on 2/4 DC'd on 2/7 due to? Side effects  Appears to be improving -antipsychotic agents: N/A 5. Neuropsych: This patientiscapable of making decisions on herown behalf.  Appreciate neuropsych eval-discussed  mood with neuropsych 6. Skin/Wound Care:Routine skin checks 7. Fluids/Electrolytes/Nutrition:Routine in and outs. 8. COPD/tobacco abuse. NicoDerm patch.   Counsel 9.  Chronic nonischemic cardiomyopathy/systolic CHF. Ejection fraction 35%. Follow-up cardiology services Filed Weights   10/21/20 0535 10/23/20 0350 10/24/20 0533  Weight: 90.9 kg 90.1 kg 90.2 kg   Status post pacemaker  Daily weights  Stable on 2/11 10. Urge incontinence: timed voids  PVRs ordered, x1 performed, unremarkable  Improved 11.  Prediabetes  Hemoglobin A1c 6.1 on 09/2020  Controlled on 2/4 12.  Leukocytosis: Resolved  WBCs within normal limits on 2/4  Afebrile, no signs/symptoms of infection 13.  AKI  Creatinine 0.94 on 2/8  Continue to encourage fluids 14. Visual disturbance - stoke +/- medications  Trial d/c Elavil  Unchanged/stable 15.  Loose stools - notes this started and persistent since admission to hospital  Patient believes it is not diet related  Fiber started on 2/9  KUB unremarkable  Colchicine DC'd - discussed with pharmacy, pt states she has never been on this medication PTA  CBC unremarkable  Imodium daily ordered  Improving  > 30 minutes spent in total in discharge planning between myself and PA regarding aforementioned, as well discussion regarding DME equipment, follow-up appointments, follow-up therapies, discharge medications, discharge recommendations, answering questions.  Please see discharge summary as well.  LOS: 11 days A FACE TO FACE EVALUATION WAS PERFORMED  Habib Kise Karis Juba 10/24/2020,  10:14 AM

## 2020-10-28 ENCOUNTER — Telehealth: Payer: Self-pay

## 2020-10-28 NOTE — Telephone Encounter (Signed)
Transition Care Management Unsuccessful Follow-up Telephone Call  Date of discharge and from where:  10/13/20  Attempts:  1st Attempt  Reason for unsuccessful TCM follow-up call:  Talk to someone who answered patient phone. Patient was unable to talk but did give the person appt information for patient.

## 2020-11-05 ENCOUNTER — Encounter: Payer: Self-pay | Admitting: Registered Nurse

## 2020-11-05 ENCOUNTER — Other Ambulatory Visit: Payer: Self-pay

## 2020-11-05 ENCOUNTER — Encounter: Payer: Medicare HMO | Attending: Registered Nurse | Admitting: Registered Nurse

## 2020-11-05 VITALS — BP 124/72 | HR 60 | Temp 97.5°F | Ht 66.5 in | Wt 205.0 lb

## 2020-11-05 DIAGNOSIS — G441 Vascular headache, not elsewhere classified: Secondary | ICD-10-CM

## 2020-11-05 DIAGNOSIS — H539 Unspecified visual disturbance: Secondary | ICD-10-CM

## 2020-11-05 DIAGNOSIS — I69354 Hemiplegia and hemiparesis following cerebral infarction affecting left non-dominant side: Secondary | ICD-10-CM

## 2020-11-05 DIAGNOSIS — I63511 Cerebral infarction due to unspecified occlusion or stenosis of right middle cerebral artery: Secondary | ICD-10-CM | POA: Diagnosis present

## 2020-11-05 MED ORDER — TOPIRAMATE 50 MG PO TABS: 50.0000 mg | ORAL_TABLET | Freq: Two times a day (BID) | ORAL | 0 refills | Status: DC

## 2020-11-05 MED ORDER — TRAMADOL HCL 50 MG PO TABS: 50.0000 mg | ORAL_TABLET | Freq: Two times a day (BID) | ORAL | 0 refills | Status: DC | PRN

## 2020-11-05 MED ORDER — BUTALBITAL-APAP-CAFFEINE 50-325-40 MG PO TABS: 2.0000 | ORAL_TABLET | Freq: Three times a day (TID) | ORAL | 0 refills | Status: DC | PRN

## 2020-11-05 MED ORDER — PANTOPRAZOLE SODIUM 40 MG PO TBEC: 40.0000 mg | DELAYED_RELEASE_TABLET | Freq: Every day | ORAL | 0 refills | Status: DC

## 2020-11-05 NOTE — Progress Notes (Signed)
Subjective:    Patient ID: Rebecca Singh, female    DOB: Jan 05, 1956, 65 y.o.   MRN: [id number]  HPI: Rebecca Singh is a 65 y.o. female who is here for Hospital Follow Up Appointment of her Right Middle Cerebral Artery Stroke, Hemiparesis affecting left side as late effect, Vascular Headache and Visual Disturbance. She presented to [facility] on 10/07/2020 via EMS with complaints  With left sided weakness, left facial droop, was drooling on left side. . Neurology Consulted.  CT Head WO Contrast:  IMPRESSION: 1. Hypodensity right frontal parietal lobe suspicious for acute infarct given the symptoms. In addition, there is a probable small hyperdense vessel in the right sylvian fissure involving the right middle cerebral artery. 2. ASPECTS is 9 CTA Neck:  IMPRESSION: 1. Right M2/MCA posterior division branch occlusion with moderate distal branch opacification via leptomeningeal collaterals. 2. No hemodynamically significant stenosis in the neck.  Per [facility] discharge Summary:  Discharge Diagnoses:   1. Acute ischemic right MCA stroke due to Right M2/MCA posterior division branch occlusion s/p IR mechanical thrombectomy with TICI2c revascularization 2. Myoperdicarditis complicating recent pacemaker insertion 3.  Post stroke headache 4.  Left hemiparesis  Ms. Westcott was admitted to inpatient Rehabilitation on 10/13/2020 and discharged home on 10/24/2020. She will be going to [facility]. She states she is having constant headache with no relief with current medication regimen. She was prescribed Tramadol, Dr Allena Katz is aware of the above and agrees with plan. She has a scheduled appointment with neurology. She rates her pain 6. In her home she is walking with walker.   Niece in the room.   Pain Inventory Average Pain 8 Pain Right Now 6 My pain is constant and aching  LOCATION OF PAIN Head  BOWEL Number of stools per week:2 Oral laxative use  No  Type of laxative none Enema or suppository use No  History of colostomy No  Incontinent No   BLADDER Normal In and out cath, frequency N/a Able to self cath N/A Bladder incontinence No  Frequent urination No  Leakage with coughing No  Difficulty starting stream No  Incomplete bladder emptying No    Mobility use a walker how many minutes can you walk? 5 MINS ability to climb steps?  yes do you drive?  no use a wheelchair needs help with transfers  Function disabled: date disabled 10/11/2020  Neuro/Psych weakness numbness trouble walking confusion depression anxiety  Prior Studies Any changes since last visit?  no New Patient  Physicians involved in your care Any changes since last visit?  no New Patient   No family history on file. Social History   Socioeconomic History  . Marital status: Married    Spouse name: Not on file  . Number of children: Not on file  . Years of education: Not on file  . Highest education level: Not on file  Occupational History  . Not on file  Tobacco Use  . Smoking status: Former Smoker    Packs/day: 1.00    Years: 15.00    Pack years: 15.00    Types: Cigarettes  . Smokeless tobacco: Never Used  Vaping Use  . Vaping Use: Never used  Substance and Sexual Activity  . Alcohol use: No  . Drug use: No  . Sexual activity: Not Currently  Other Topics Concern  . Not on file  Social History Narrative  . Not on file   Social Determinants of Health   Financial Resource Strain:  Not on file  Food Insecurity: Not on file  Transportation Needs: Not on file  Physical Activity: Not on file  Stress: Not on file  Social Connections: Not on file   Past Surgical History:  Procedure Laterality Date  . ABDOMINAL HYSTERECTOMY    . CHOLECYSTECTOMY    . INSERT / REPLACE / REMOVE PACEMAKER    . IR CT HEAD LTD  10/07/2020  . IR PERCUTANEOUS ART THROMBECTOMY/INFUSION INTRACRANIAL INC DIAG ANGIO  10/07/2020  . RADIOLOGY WITH  ANESTHESIA N/A 10/07/2020   Procedure: IR WITH ANESTHESIA - CODE STROKE;  Surgeon: Radiologist, Medication, MD;  Location: [facility] OR;  Service: Radiology;  Laterality: N/A;  . THYROIDECTOMY    . TONSILLECTOMY     Past Medical History:  Diagnosis Date  . AICD (automatic cardioverter/defibrillator) present   . COPD (chronic obstructive pulmonary disease) (HCC)   . Hypothyroidism   . Myocardial infarction (HCC)   . Pacemaker/defibrillator   . Stroke (HCC)    BP 124/72   Pulse (!) 54   Temp (!) 97.5 F (36.4 C)   Ht 5' 6.5" (1.689 m)   Wt 205 lb (93 kg)   BMI 32.59 kg/m   Opioid Risk Score:   Fall Risk Score:  `1  Depression screen PHQ 2/9  Depression screen PHQ 2/9 11/05/2020  Decreased Interest 0  Down, Depressed, Hopeless 3  PHQ - 2 Score 3  Altered sleeping 3  Tired, decreased energy 2  Change in appetite 3  Feeling bad or failure about yourself  0  Trouble concentrating 3  Moving slowly or fidgety/restless 0  Suicidal thoughts 0  PHQ-9 Score 14   Review of Systems  Eyes: Positive for visual disturbance.  Musculoskeletal: Positive for gait problem.       Left hand weakness  Neurological: Positive for headaches.  All other systems reviewed and are negative.      Objective:   Physical Exam Vitals and nursing note reviewed.  Constitutional:      Appearance: Normal appearance.  Cardiovascular:     Rate and Rhythm: Normal rate and regular rhythm.     Pulses: Normal pulses.     Heart sounds: Normal heart sounds.  Pulmonary:     Effort: Pulmonary effort is normal.     Breath sounds: Normal breath sounds.  Musculoskeletal:     Cervical back: Normal range of motion and neck supple.     Comments: Normal Muscle Bulk and Muscle Testing Reveals:  Upper Extremities: Right: Full ROM and Muscle Strength 5/5 Left Upper Extremity: Decreased ROM and Muscle Strength 1/5 Lower Extremities: Right Lower Extremity: Full ROM and Muscle Strength 5/5 Left Lower Extremity: Decreased  ROM and Muscle Strength 4/5 Arrived in wheelchair   Skin:    General: Skin is dry.  Neurological:     Mental Status: She is alert and oriented to person, place, and time.  Psychiatric:        Mood and Affect: Mood normal.        Behavior: Behavior normal.           Assessment & Plan:  1.Right Middle Cerebral Artery Stroke:Hemiparesis affecting left side as late effect: She has a scheduled appointment with [facility]: Has a scheduled appointment with Neurology. Continue to Monitor.  2 Vascular Headache: Continue current medication regimen.  RX: Tramadol 50 mg one tablet twice a day as needed for pain #60. She has a scheduled appointment with neurology.   3. Visual Disturbance. Ms. Franzen reports she  seen a ophthalmologist last week: Opthalmology Following.  Continue to monitor.  Ms. Rowzee reports her sister is dispensing her medications due to visual disturbance.   F/U with Dr Allena Katz in 4- 6 weeks

## 2020-11-05 NOTE — Patient Instructions (Addendum)
Call to Schedule Appointment   Dr. Fransico Him: Cardiology  [address]  [address]  [phone]     My- Chart : [phone]    Send a My-Chart Message in one week to evaluate Tramadol

## 2020-11-12 ENCOUNTER — Ambulatory Visit: Payer: Medicare HMO | Attending: Physician Assistant

## 2020-11-12 DIAGNOSIS — R2681 Unsteadiness on feet: Secondary | ICD-10-CM | POA: Insufficient documentation

## 2020-11-12 DIAGNOSIS — R269 Unspecified abnormalities of gait and mobility: Secondary | ICD-10-CM | POA: Insufficient documentation

## 2020-11-12 DIAGNOSIS — I63511 Cerebral infarction due to unspecified occlusion or stenosis of right middle cerebral artery: Secondary | ICD-10-CM | POA: Diagnosis not present

## 2020-11-12 DIAGNOSIS — J310 Chronic rhinitis: Secondary | ICD-10-CM | POA: Insufficient documentation

## 2020-11-12 DIAGNOSIS — R278 Other lack of coordination: Secondary | ICD-10-CM | POA: Insufficient documentation

## 2020-11-12 DIAGNOSIS — M6281 Muscle weakness (generalized): Secondary | ICD-10-CM | POA: Diagnosis present

## 2020-11-12 NOTE — Therapy (Signed)
[facility] MAIN [facility] SERVICES [address] [place], [place], [address] Phone: [phone]   Fax:  [phone]  Physical Therapy Evaluation  Patient Details  Name: Rebecca Singh MRN: [id number] Date of Birth: 07/15/1956 Referring Provider (PT): Mariam Dollar   Encounter Date: 11/12/2020   PT End of Session - 11/12/20 1111    Visit Number 1    Number of Visits 25    Date for PT Re-Evaluation 02/04/21    PT Start Time 1005    PT Stop Time 1055    PT Time Calculation (min) 50 min    Equipment Utilized During Treatment Gait belt    Activity Tolerance Patient tolerated treatment well;Other (comment)   some tests not performed due to patient needing to be in treatment room for evaluation due to overstimulation out in clinic   Behavior During Therapy --   Patient with some agitation during evaluation          Past Medical History:  Diagnosis Date  . AICD (automatic cardioverter/defibrillator) present   . COPD (chronic obstructive pulmonary disease) (HCC)   . Hypothyroidism   . Myocardial infarction (HCC)   . Pacemaker/defibrillator   . Stroke [facility])     Past Surgical History:  Procedure Laterality Date  . ABDOMINAL HYSTERECTOMY    . CHOLECYSTECTOMY    . INSERT / REPLACE / REMOVE PACEMAKER    . IR CT HEAD LTD  10/07/2020  . IR PERCUTANEOUS ART THROMBECTOMY/INFUSION INTRACRANIAL INC DIAG ANGIO  10/07/2020  . RADIOLOGY WITH ANESTHESIA N/A 10/07/2020   Procedure: IR WITH ANESTHESIA - CODE STROKE;  Surgeon: Radiologist, Medication, MD;  Location: [facility] OR;  Service: Radiology;  Laterality: N/A;  . THYROIDECTOMY    . TONSILLECTOMY      There were no vitals filed for this visit.    Subjective Assessment - 11/12/20 1006    Subjective The patient reports L side weakness admitted for R MCA 10/07/20.  The patient reports that she was in the hospital until 10/24/20.  Since being at home she has not had any treatment and has her neice to help  her.  Patient request not being asked too many questions at once and that she does not tolerate a lot of noise or people around.  The patient reports that her left LE feels weak and has less sensation to touch on the left.  The patient also reports decreased balance and decreased coordination.  Patient using a RW at home when she does ambulate.  The patient has a TKA on the left 10/21 and feels as though she was still weak on that side prior to her stroke because she was still recoving.    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 unrestricted    How long can you stand comfortably? 2-3 mins    How long can you walk comfortably? 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 No/denies    Pain Score 0-No pain              [facility] PT Assessment - 11/12/20 0001      Assessment   Medical Diagnosis CVA    Referring  Provider (PT) Mariam Dollar    Onset Date/Surgical Date 10/07/20    Hand Dominance Right    Next MD Visit next week    Prior Therapy PT in hospital      Balance Screen   Has the patient fallen in the past 6 months Yes    How many times? 1    Has the patient had a decrease in activity level because of a fear of falling?  No    Is the patient reluctant to leave their home because of a fear of falling?  No      Home Environment   Living Environment Private residence    Living Arrangements Other relatives    Available Help at Discharge Family    Type of Home House    Home Access Stairs to enter    Entrance Stairs-Number of Steps 7    Entrance Stairs-Rails Cannot reach both    Home Layout One level      Prior Function   Level of  Independence Independent      Cognition   Overall Cognitive Status Within Functional Limits for tasks assessed          LATEX ALLERGY   BP 119/71 HR 58   PAIN: 0/10  POSTURE: Seated posture patient prefers UE support but able to sit with equal weight bearing bilaterally   AROM bilateral LEs WFL   STRENGTH:  Graded on a 0-5 scale Muscle Group Left Right  Hip Flex 3/5 /5  Hip Abd 4/5 /5  Hip Add 4/5 /5  Hip Ext 4/5 /5  Knee Flex 3/5 /5  Knee Ext 3/5 /5  Ankle DF 3/5 /5  Ankle PF 3+/5 /5   SENSATION:  BUE :  BLE :   NEUROLOGICAL SCREEN: (2+ unless otherwise noted.) N=normal  Ab=abnormal    Level Dermatome R L  L2 Medial thigh/groin N AB  L3 Lower thigh/med.knee N AB  L4 Medial leg/lat thigh N AB  L5 Lat. leg & dorsal foot N AB  S1 post/lat foot/thigh/leg N AB  S2 Post./med. thigh & leg N AB    SOMATOSENSORY:  Any N & T in extremities or weakness: reports :         Sensation           Intact      Diminished         Absent  Light touch Left   X                             COORDINATION:          Heel Shin Slide Test: weakness sliding up right shin, coordination ability intact   BALANCE: Static Sitting Balance  Normal Able to maintain balance against maximal resistance   Good Able to maintain balance against moderate resistance   Good-/Fair+ Accepts minimal resistance X  Fair Able to sit unsupported without balance loss and without UE support   Poor+ Able to maintain with Minimal assistance from individual or chair   Poor Unable to maintain balance-requires mod/max support from individual or chair    Static Standing Balance  Normal Able to maintain standing balance against maximal resistance   Good Able to maintain standing balance against moderate resistance   Good-/Fair+ Able to maintain standing balance against minimal resistance   Fair Able to stand unsupported without UE support and without LOB for 1-2 min   Fair- Requires Min A and  UE support  to maintain standing without loss of balance X  Poor+ Requires mod A and UE support to maintain standing without loss of balance   Poor Requires max A and UE support to maintain standing balance without loss    Dynamic Sitting Balance  Normal Able to sit unsupported and weight shift across midline maximally   Good Able to sit unsupported and weight shift across midline moderately X  Good-/Fair+ Able to sit unsupported and weight shift across midline minimally   Fair Minimal weight shifting ipsilateral/front, difficulty crossing midline   Fair- Reach to ipsilateral side and unable to weight shift   Poor + Able to sit unsupported with min A and reach to ipsilateral side, unable to weight shift   Poor Able to sit unsupported with mod A and reach ipsilateral/front-can't cross midline       GAIT: ot assessed, patient will need RW  OUTCOME MEASURES: TEST Outcome Interpretation  5 times sit<>stand 28.82 sec bilateral UE assist >48 yo, >15 sec indicates increased risk for falls  10 meter walk test              Not performed <1.0 m/s indicates increased risk for falls; limited community ambulator  Berg Balance Assessment Not performed <36/56 (100% risk for falls), 37-45 (80% risk for falls); 46-51 (>50% risk for falls); 52-55 (lower risk <25% of falls)  FOTO 48 62          Objective measurements completed on examination: See above findings.               PT Education - 11/12/20 1011    Education Details goals, POC    Person(s) Educated Patient;Caregiver(s)    Methods Explanation    Comprehension Verbalized understanding            PT Short Term Goals - 11/12/20 1002      PT SHORT TERM GOAL #1   Title Patient will be independent in home exercise program to improve strength/mobility for better functional independence with ADLs.    Status New    Target Date 02/04/21             PT Long Term Goals - 11/12/20 1002      PT LONG TERM GOAL #1   Title Patient will  increase FOTO score to equal to or greater than 62% to demonstrate statistically significant improvement in mobility and quality of life.    Baseline 11/12/20 62%    Status New    Target Date 02/04/21      PT LONG TERM GOAL #2   Title Patient (> 75 years old) will complete five times sit to stand test in < 15 seconds indicating an increased LE strength and improved balance.    Baseline 11/12/20 28.82    Status New    Target Date 02/04/21      PT LONG TERM GOAL #3   Title Patient will increase 10 meter walk test to >1.64m/s as to improve gait speed for better community ambulation and to reduce fall risk.    Baseline 11/12/20  not assessed    Status New    Target Date 02/04/21      PT LONG TERM GOAL #4   Title Patient will increase Berg Balance score to at least 45/56  points to demonstrate decreased fall risk during functional activities.    Baseline 11/12/20  not assessed    Status New    Target Date 02/04/21  Plan - 11/12/20 1119    Clinical Impression Statement The patient is a pleasant 65 year old female presentin s/p right MCA stroke with residual left sided weakness.  The patient had lower tolerance to activity and noise level in the clinic requiring a private treatment room for her evaluation.  Patient presents with left LE weakness, decreased balance, impaired gait and decreased independence in the home.  The patient continues to benefit from additional skilled PT services to improved strength and balance for improved gait abilities and improve quality of life.    Personal Factors and Comorbidities Comorbidity 3+    Comorbidities Left TKA 10/21, pacemaker, COPD, HTN, prediabetic    Examination-Activity Limitations Bathing;Dressing;Transfers;Lift;Squat;Locomotion Level;Stairs;Carry;Stand    Examination-Participation Restrictions Cleaning;Community Activity;Driving;Meal Prep;Occupation;Shop    Stability/Clinical Decision Making Evolving/Moderate complexity     Clinical Decision Making Moderate    Rehab Potential Good    PT Frequency 2x / week    PT Duration 12 weeks    PT Treatment/Interventions ADLs/Self Care Home Management;Gait training;Stair training;Functional mobility training;Therapeutic activities;Therapeutic exercise;Balance training;Neuromuscular re-education;Patient/family education;Visual/perceptual remediation/compensation    PT Next Visit Plan initiate strength and balance program, attempt 58m walk test and Berg    PT Home Exercise Plan issue next visit    Recommended Other Services OT which is to start next week    Consulted and Agree with Plan of Care Patient;Family member/caregiver    Family Member Consulted niece           Patient will benefit from skilled therapeutic intervention in order to improve the following deficits and impairments:  Abnormal gait,Decreased balance,Decreased endurance,Decreased mobility,Difficulty walking,Decreased range of motion,Decreased activity tolerance,Decreased strength,Impaired flexibility,Impaired UE functional use  Visit Diagnosis: Right middle cerebral artery stroke (HCC)  Unsteadiness on feet  Muscle weakness (generalized)  Abnormality of gait and mobility     Problem List Patient Active Problem List   Diagnosis Date Noted  . Loose stools   . Visual disturbance   . AKI (acute kidney injury) (HCC)   . Hemiparesis affecting left side as late effect of stroke (HCC)   . Leukocytosis   . Urge incontinence   . Chronic systolic congestive heart failure (HCC)   . Vascular headache   . Anxiety state   . 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 Acute myopericarditis   . SAH (subarachnoid hemorrhage) (HCC)   . Tobacco abuse   . Chronic pain syndrome   . Prediabetes   . Nonischemic cardiomyopathy (HCC)   . AICD (automatic cardioverter/defibrillator) present   . Acute ischemic right MCA stroke (HCC) 10/07/2020  . Middle cerebral artery embolism, right 10/07/2020  .  Chest pain 11/16/2017    Colin Mulders PT, DPT 11/12/2020, 12:47 PM  [place] [facility] MAIN [facility] SERVICES [address] [place], [place], [address] Phone: [phone]   Fax:  [phone]  Name: Rebecca Singh MRN: [id number] Date of Birth: 1956/07/24

## 2020-11-17 ENCOUNTER — Encounter: Payer: Self-pay | Admitting: Cardiology

## 2020-11-17 ENCOUNTER — Ambulatory Visit (INDEPENDENT_AMBULATORY_CARE_PROVIDER_SITE_OTHER): Payer: Medicare HMO | Admitting: Cardiology

## 2020-11-17 ENCOUNTER — Other Ambulatory Visit: Payer: Self-pay

## 2020-11-17 ENCOUNTER — Ambulatory Visit: Payer: Medicare HMO

## 2020-11-17 VITALS — BP 120/76 | HR 64 | Ht 66.5 in | Wt 200.0 lb

## 2020-11-17 DIAGNOSIS — Z95810 Presence of automatic (implantable) cardiac defibrillator: Secondary | ICD-10-CM

## 2020-11-17 DIAGNOSIS — I639 Cerebral infarction, unspecified: Secondary | ICD-10-CM

## 2020-11-17 DIAGNOSIS — I428 Other cardiomyopathies: Secondary | ICD-10-CM | POA: Diagnosis not present

## 2020-11-17 MED ORDER — METOPROLOL SUCCINATE ER 25 MG PO TB24: 25.0000 mg | ORAL_TABLET | Freq: Every day | ORAL | 5 refills | Status: DC

## 2020-11-17 NOTE — Patient Instructions (Signed)
Medication Instructions:   Your physician has recommended you make the following change in your medication:   1.  START taking Toprol XL 25 MG once daily.   *If you need a refill on your cardiac medications before your next appointment, please call your pharmacy*   Lab Work: None ordered If you have labs (blood work) drawn today and your tests are completely normal, you will receive your results only by: Marland Kitchen MyChart Message (if you have MyChart) OR . A paper copy in the mail If you have any lab test that is abnormal or we need to change your treatment, we will call you to review the results.   Testing/Procedures: None ordered   Follow-Up: At [facility], you and your health needs are our priority.  As part of our continuing mission to provide you with exceptional heart care, we have created designated Provider Care Teams.  These Care Teams include your primary Cardiologist (physician) and Advanced Practice Providers (APPs -  Physician Assistants and Nurse Practitioners) who all work together to provide you with the care you need, when you need it.  We recommend signing up for the patient portal called "MyChart".  Sign up information is provided on this After Visit Summary.  MyChart is used to connect with patients for Virtual Visits (Telemedicine).  Patients are able to view lab/test results, encounter notes, upcoming appointments, etc.  Non-urgent messages can be sent to your provider as well.   To learn more about what you can do with MyChart, go to [email or web address].    Your next appointment:   3 month(s)  The format for your next appointment:   In Person  Provider:   Debbe Odea, MD   Other Instructions

## 2020-11-17 NOTE — Progress Notes (Signed)
Cardiology Office Note:    Date:  11/17/2020   ID:  Rebecca Singh, DOB 1956-04-06, MRN [id number]  PCP:  [facility]  Cardiologist:  Debbe Odea, MD  Advanced Practice Provider:  No care team member to display Electrophysiologist:  None       Referring MD: [facility]   Chief Complaint  Patient presents with  . New Patient (Initial Visit)    Ref by Dr. Allena Katz for stroke and CHF; cardiac history with Dr. Excell Seltzer at [facility]. Patient c/o left side weakness due to stroke. Medications reviewed by the patient verbally. Patient had a Defib/Pacemaker battery change in Dec. 2021.     History of Present Illness:    Rebecca Singh is a 65 y.o. female with a hx of NICM( last EF30-35%) s/p Bi-V ICD (gen change 09/25/2020 [employer]), COPD, CVA, former smoker x 56yrs who presents to establish care due to history of CHF and stroke.  Previously followed at [facility], patient is at [facility] would like to cardiac transition care.  Patient presented to the ED at [facility] 10/06/2020 due to severe headache, left-sided weakness.  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  Underwent emergent thrombectomy.  Echocardiogram 10/09/2020 EF 30 to 35%.  Lexiscan Myoview 11/2017 no significant ischemia.  She has residual left-sided weakness from stroke.  Has appointment to follow-up with neurology and also physical therapy.  Past Medical History:  Diagnosis Date  . AICD (automatic cardioverter/defibrillator) present   . COPD (chronic obstructive pulmonary disease) (HCC)   . Hypothyroidism   . Myocardial infarction (HCC)   . Pacemaker/defibrillator   . Stroke [facility])     Past Surgical History:  Procedure Laterality Date  . ABDOMINAL HYSTERECTOMY    . CHOLECYSTECTOMY    . INSERT / REPLACE / REMOVE PACEMAKER    . IR CT HEAD LTD  10/07/2020  . IR PERCUTANEOUS ART THROMBECTOMY/INFUSION INTRACRANIAL INC DIAG ANGIO  10/07/2020  .  RADIOLOGY WITH ANESTHESIA N/A 10/07/2020   Procedure: IR WITH ANESTHESIA - CODE STROKE;  Surgeon: Radiologist, Medication, MD;  Location: [facility] OR;  Service: Radiology;  Laterality: N/A;  . THYROIDECTOMY    . TONSILLECTOMY      Current Medications: Current Meds  Medication Sig  . acetaminophen (TYLENOL) 325 MG tablet Take 2 tablets (650 mg total) by mouth every 4 (four) hours as needed for mild pain (or temp > 37.5 C (99.5 F)).  Marland Kitchen aspirin EC 81 MG EC tablet Take 1 tablet (81 mg total) by mouth daily. Swallow whole.  Marland Kitchen atorvastatin (LIPITOR) 40 MG tablet Take 1 tablet by mouth daily.  . butalbital-acetaminophen-caffeine (FIORICET) 50-325-40 MG tablet Take 2 tablets by mouth every 8 (eight) hours as needed for headache.  . levothyroxine (SYNTHROID) 125 MCG tablet Take 1 tablet by mouth daily.  Marland Kitchen loperamide (IMODIUM) 2 MG capsule Take 1 capsule (2 mg total) by mouth daily.  Marland Kitchen losartan (COZAAR) 25 MG tablet Take 1 tablet by mouth daily.  . metoprolol succinate (TOPROL XL) 25 MG 24 hr tablet Take 1 tablet (25 mg total) by mouth daily.  . nicotine (NICODERM CQ - DOSED IN MG/24 HOURS) 14 mg/24hr patch 14 mg patch daily x3 weeks then 7 mg patch daily x3 weeks and stop  . omeprazole (PRILOSEC) 40 MG capsule Take 1 capsule by mouth daily.  Marland Kitchen topiramate (TOPAMAX) 50 MG tablet Take 1 tablet (50 mg total) by mouth 2 (two) times daily.  Allergies:   Codeine, Aspirin, Latex, and Other   Social History   Socioeconomic History  . Marital status: Widowed    Spouse name: Not on file  . Number of children: Not on file  . Years of education: Not on file  . Highest education level: Not on file  Occupational History  . Not on file  Tobacco Use  . Smoking status: Former Smoker    Packs/day: 1.00    Years: 15.00    Pack years: 15.00    Types: Cigarettes  . Smokeless tobacco: Never Used  Vaping Use  . Vaping Use: Never used  Substance and Sexual Activity  . Alcohol use: No  . Drug use: No  .  Sexual activity: Not Currently  Other Topics Concern  . Not on file  Social History Narrative  . Not on file   Social Determinants of Health   Financial Resource Strain: Not on file  Food Insecurity: Not on file  Transportation Needs: Not on file  Physical Activity: Not on file  Stress: Not on file  Social Connections: Not on file     Family History: The patient's family history includes Diabetes in her brother, brother, and mother; Heart disease (age of onset: 57) in her mother; Macular degeneration in her father.  ROS:   Please see the history of present illness.     All other systems reviewed and are negative.  EKGs/Labs/Other Studies Reviewed:    The following studies were reviewed today:   EKG:  EKG is  ordered today.  The ekg ordered today demonstrates V paced rhythm  Recent Labs: 10/08/2020: TSH 2.368 10/14/2020: ALT 20 10/21/2020: BUN 27; Creatinine, Ser 0.94; Potassium 3.5; Sodium 139 10/22/2020: Hemoglobin 15.0; Platelets 275  Recent Lipid Panel    Component Value Date/Time   CHOL 124 10/08/2020 0443   TRIG 52 10/08/2020 0443   HDL 49 10/08/2020 0443   CHOLHDL 2.5 10/08/2020 0443   VLDL 10 10/08/2020 0443   LDLCALC 65 10/08/2020 0443     Risk Assessment/Calculations:      Physical Exam:    VS:  BP 120/76 (BP Location: Left Arm, Patient Position: Sitting, Cuff Size: Normal)   Pulse 64   Ht 5' 6.5" (1.689 m)   Wt 200 lb (90.7 kg)   SpO2 98%   BMI 31.80 kg/m     Wt Readings from Last 3 Encounters:  11/17/20 200 lb (90.7 kg)  11/05/20 205 lb (93 kg)  10/24/20 198 lb 13.7 oz (90.2 kg)     GEN:  Well nourished, well developed in no acute distress HEENT: Normal NECK: No JVD; No carotid bruits LYMPHATICS: No lymphadenopathy CARDIAC: RRR, no murmurs, rubs, gallops RESPIRATORY: Decreased breath sounds at bases ABDOMEN: Soft, non-tender, non-distended MUSCULOSKELETAL:  No edema; No deformity  SKIN: Warm and dry NEUROLOGIC:  Alert and oriented x  3 PSYCHIATRIC:  Normal affect   ASSESSMENT:    1. NICM (nonischemic cardiomyopathy) (HCC)   2. S/P implantation of automatic cardioverter/defibrillator (AICD)   3. Cerebrovascular accident (CVA), unspecified mechanism (HCC)    PLAN:    In order of problems listed above:  1. Nonischemic cardiomyopathy, EF 30 to 35%.  Start Toprol-XL, continue losartan.  If BP permits, consider switch to [facility] if patient okay with cost.  Referral to advanced heart failure clinic recommended, patient declined due to not wanting to drive to [place].  Patient will bring records of prior right and left heart cath.  2. Status post AICD -[employer].  Establish care with device clinic. 3. Recent CVA, continue physical therapy, aspirin, Lipitor.  Encouraged to stay tobacco free.  Denies interrogation for presence of A. fib/flutter.    Medication Adjustments/Labs and Tests Ordered: Current medicines are reviewed at length with the patient today.  Concerns regarding medicines are outlined above.  Orders Placed This Encounter  Procedures  . Ambulatory referral to Cardiac Electrophysiology  . EKG 12-Lead   Meds ordered this encounter  Medications  . metoprolol succinate (TOPROL XL) 25 MG 24 hr tablet    Sig: Take 1 tablet (25 mg total) by mouth daily.    Dispense:  30 tablet    Refill:  5    Patient Instructions  Medication Instructions:   Your physician has recommended you make the following change in your medication:   1.  START taking Toprol XL 25 MG once daily.   *If you need a refill on your cardiac medications before your next appointment, please call your pharmacy*   Lab Work: None ordered If you have labs (blood work) drawn today and your tests are completely normal, you will receive your results only by: Marland Kitchen MyChart Message (if you have MyChart) OR . A paper copy in the mail If you have any lab test that is abnormal or we need to change your treatment, we will call you to  review the results.   Testing/Procedures: None ordered   Follow-Up: At [facility], you and your health needs are our priority.  As part of our continuing mission to provide you with exceptional heart care, we have created designated Provider Care Teams.  These Care Teams include your primary Cardiologist (physician) and Advanced Practice Providers (APPs -  Physician Assistants and Nurse Practitioners) who all work together to provide you with the care you need, when you need it.  We recommend signing up for the patient portal called "MyChart".  Sign up information is provided on this After Visit Summary.  MyChart is used to connect with patients for Virtual Visits (Telemedicine).  Patients are able to view lab/test results, encounter notes, upcoming appointments, etc.  Non-urgent messages can be sent to your provider as well.   To learn more about what you can do with MyChart, go to [email or web address].    Your next appointment:   3 month(s)  The format for your next appointment:   In Person  Provider:   Debbe Odea, MD   Other Instructions      Signed, Debbe Odea, MD  11/17/2020 11:57 AM    [facility]

## 2020-11-21 ENCOUNTER — Other Ambulatory Visit: Payer: Self-pay

## 2020-11-21 ENCOUNTER — Encounter: Payer: Self-pay | Admitting: Occupational Therapy

## 2020-11-21 ENCOUNTER — Ambulatory Visit: Payer: Medicare HMO | Admitting: Occupational Therapy

## 2020-11-21 DIAGNOSIS — I63511 Cerebral infarction due to unspecified occlusion or stenosis of right middle cerebral artery: Secondary | ICD-10-CM

## 2020-11-21 DIAGNOSIS — R2681 Unsteadiness on feet: Secondary | ICD-10-CM

## 2020-11-21 DIAGNOSIS — M6281 Muscle weakness (generalized): Secondary | ICD-10-CM

## 2020-11-21 DIAGNOSIS — R278 Other lack of coordination: Secondary | ICD-10-CM

## 2020-11-22 NOTE — Therapy (Addendum)
[place] [facility] MAIN [facility] SERVICES [address] [place], [place], [address] Phone: [phone]   Fax:  [phone]  Occupational Therapy Evaluation  Patient Details  Name: Rebecca Singh MRN: [id number] Date of Birth: 1956/08/20 No data recorded  Encounter Date: 11/21/2020   OT End of Session - 12/04/20 1441    Visit Number 1    Number of Visits 24    Date for OT Re-Evaluation 02/13/21    OT Start Time 1016    OT Stop Time 1115    OT Time Calculation (min) 59 min    Activity Tolerance Patient tolerated treatment well    Behavior During Therapy [facility] for tasks assessed/performed   Patient with some agitation during evaluation          Past Medical History:  Diagnosis Date  . AICD (automatic cardioverter/defibrillator) present   . COPD (chronic obstructive pulmonary disease) (HCC)   . Hypothyroidism   . Myocardial infarction (HCC)   . Pacemaker/defibrillator   . Stroke [facility])     Past Surgical History:  Procedure Laterality Date  . ABDOMINAL HYSTERECTOMY    . CHOLECYSTECTOMY    . INSERT / REPLACE / REMOVE PACEMAKER    . IR CT HEAD LTD  10/07/2020  . IR PERCUTANEOUS ART THROMBECTOMY/INFUSION INTRACRANIAL INC DIAG ANGIO  10/07/2020  . RADIOLOGY WITH ANESTHESIA N/A 10/07/2020   Procedure: IR WITH ANESTHESIA - CODE STROKE;  Surgeon: Radiologist, Medication, MD;  Location: [facility] OR;  Service: Radiology;  Laterality: N/A;  . THYROIDECTOMY    . TONSILLECTOMY      There were no vitals filed for this visit.   Subjective Assessment - 12/04/20 1439    Subjective  Pt reports she got up one morning and got ready for work, went to work and had difficulty with holding onto objects, difficulty speaking.  EMS was called and she was transported to the hospital and admitted for a CVA.  Was at [facility] Oct 07, 2020 and was discharged 10-24-2020.    Pertinent History CVA on 10-07-2020 and discharged on 10-24-2020. 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    Currently in Pain? No/denies    Pain Score 0-No pain    Pain Onset 1 to 4 weeks ago             [facility] OT Assessment - 12/04/20 1817      Assessment   Medical Diagnosis CVA    Onset Date/Surgical Date 10/07/20    Hand Dominance Right      Precautions   Precautions Fall      Balance Screen   Has the patient fallen in the past 6 months Yes    How many times? 2      Home  Environment   Family/patient expects to be discharged to: Private residence    Living Arrangements Children    Available Help at Discharge Family    Type of Home House    Home Access Stairs    Home Layout One level    Alternate Level Stairs - Number of Steps 7 to enter    [employer];Door    [employer] - manual;Bedside commode;Walker - 2 wheels;Grab bars - tub/shower;Shower seat    Lives  With Family;Son   sister, grandkids     Prior Function   Level of Independence Independent    Vocation Full time employment    Vocation Requirements food service    Leisure spending time with daughter, beach      ADL   Eating/Feeding Needs assist with cutting food    Grooming Modified independent    Upper Body Bathing Modified independent    Lower Body Bathing Increased time    Upper Body Dressing Increased time    Lower Body Dressing Increased time   elastic shoe laces   Toilet Transfer Modified independent    Toileting - Clothing Manipulation Increased time    Toileting -  Hygiene Increase time    Tub/Shower Transfer Modified independent    ADL comments Pt reports she is able to help with laundry, son made a shelf to put on her walker, she can put items in washer, can fold.  Can do some dishes but fatigues with  standing.      IADL   Prior Level of Function Shopping independent    Shopping Needs to be accompanied on any shopping trip    Prior Level of Function Light Housekeeping independent    Light Housekeeping Needs help with all home maintenance tasks    Prior Level of Function Meal Prep independent    Meal Prep Needs to have meals prepared and served    Prior Level of Function Paramedic on family or friends for transportation    Prior Level of Function Medication Managment independent    Medication Management Is not capable of dispensing or managing own medication    Prior Level of Function Financial Management independent    Financial Management Requires supervision/minimal cuing      Mobility   Mobility Status Needs assist    Mobility Status Comments ambulates with walker at home for short distances, in community uses the wheelchair      Vision - History   Baseline Vision Wears glasses only for reading    Additional Comments difficulty focusing on items      Cognition   Overall Cognitive Status Impaired/Different from baseline    Memory Impaired    Memory Impairment Decreased short term memory    Problem Solving Impaired    Executive Function Decision Making;Reasoning      Observation/Other Assessments   Focus on Therapeutic Outcomes (FOTO)  44      Sensation   Light Touch Impaired by gross assessment    Stereognosis Impaired by gross assessment    Hot/Cold Impaired by gross assessment    Additional Comments Pt reports numbness, tingling.      Coordination   Gross Motor Movements are Fluid and Coordinated No    Fine Motor Movements are Fluid and Coordinated No    9 Hole Peg Test Right;Left    Right 9 Hole Peg Test 29    Left 9 Hole Peg Test 50      AROM   Overall AROM Comments Decreased ROM and proprioception      Strength   Overall Strength Comments R UE 4/5, L 2/5      Hand Function   Right Hand Grip (lbs) 25     Right Hand Lateral Pinch 9 lbs    Right Hand 3 Point Pinch 7 lbs    Left Hand Grip (lbs) 1    Left Hand Lateral Pinch 1 lbs    Left 3 point pinch 1  lbs                                OT Long Term Goals - 12/04/20 1446      OT LONG TERM GOAL #1   Title Patient will be modified independent with home exercise program    Baseline Eval: No current program    Time 12    Period Weeks    Status New    Target Date 02/13/21      OT LONG TERM GOAL #2   Title Patient will improve grip strength of left hand by 10# to be able to stabilize and cut food with modified independence.    Baseline Eval: unable    Time 12    Period Weeks    Status New    Target Date 02/13/21      OT LONG TERM GOAL #3   Title Patient will demonstrate washing dishes in standing for up to 10 mins without rest breaks with modified independence.    Baseline Eval difficulty with task.    Time 8    Period Weeks    Status New    Target Date 01/16/21      OT LONG TERM GOAL #4   Title Patient will increase strength in left shoulder by 1 mm grade to place clothing in closet with modified independence.    Baseline Eval:  strength 2/5    Time 12    Period Weeks    Status New    Target Date 02/13/21      OT LONG TERM GOAL #5   Title Pt will improve functional hand strength to open jars and containers with occasional min assist.    Baseline Eval: unable to open jars and containers    Time 12    Period Weeks    Status New    Target Date 02/13/21      Long Term Additional Goals   Additional Long Term Goals --      OT LONG TERM GOAL #6   Title Pt will complete light meal prep with modified independence    Baseline Eval: unable    Time 12    Period Weeks    Status New    Target Date 02/13/21      OT LONG TERM GOAL #7   Title Patient will decrease 9 hole peg test  by 15 secs to manipulate small objects with modified independence and with good speed.    Baseline Eval: 50secs    Time 8     Period Weeks    Status New    Target Date 02/13/21      OT LONG TERM GOAL #8   Title Pt will improve FOTO score to 57 or above to show a clinically signifcant change function to complete daily tasks with greater independence.    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  Patient referred for outpatient OT evaluation.  Patient presents with muscle weakness, decreased coordination, decreased transfers and functional mobility, decreased ability to perform self-care and IADL  tasks.  Patient with limited range of motion and hand function in left upper extremity.  Patient also demonstrates impairments in cognition, memory and has difficulty with overstimulation in busy environments.  Patient would benefit from skilled occupational therapy intervention to maximize safety and independence and necessary daily tasks.    OT Occupational Profile and History Detailed Assessment- Review of Records and additional review of physical, cognitive, psychosocial history related to current functional performance    Occupational performance deficits (Please refer to evaluation for details): ADL's;IADL's;Work;Leisure    Body Structure / Function / Physical Skills ADL;Dexterity;Flexibility;ROM;Strength;Balance;Coordination;FMC;IADL;Endurance;Gait;Pain;UE functional use;Decreased knowledge of use of DME;GMC;Mobility    Cognitive Skills Attention;Memory;Safety Awareness    Psychosocial Skills Environmental  Adaptations;Habits;Routines and Behaviors    Rehab Potential Good    Clinical Decision Making Several treatment options, min-mod task modification necessary    Comorbidities Affecting Occupational Performance: May have comorbidities impacting occupational  performance    Modification or Assistance to Complete Evaluation  No modification of tasks or assist necessary to complete eval    OT Frequency 2x / week    OT Duration 12 weeks    OT Treatment/Interventions Cryotherapy;Self-care/ADL training;Therapeutic exercise;DME and/or AE instruction;Functional Mobility Training;Cognitive remediation/compensation;Neuromuscular education;Manual Therapy;Moist Heat;Contrast Bath;Energy conservation;Therapeutic activities;Passive range of motion;Patient/family education    Consulted and Agree with Plan of Care Patient;Family member/caregiver    Family Member Consulted daughter           Patient will benefit from skilled therapeutic intervention in order to improve the following deficits and impairments:   Body Structure / Function / Physical Skills: ADL,Dexterity,Flexibility,ROM,Strength,Balance,Coordination,FMC,IADL,Endurance,Gait,Pain,UE functional use,Decreased knowledge of use of DME,GMC,Mobility Cognitive Skills: Attention,Memory,Safety Awareness Psychosocial Skills: Environmental  Adaptations,Habits,Routines and Behaviors   Visit Diagnosis: Muscle weakness (generalized)  Right middle cerebral artery stroke (HCC)  Unsteadiness on feet  Other lack of coordination    Problem List Patient Active Problem List   Diagnosis Date Noted  . Loose stools   . Visual disturbance   . AKI (acute kidney injury) (HCC)   . Hemiparesis affecting left side as late effect of stroke (HCC)   . Leukocytosis   . Urge incontinence   . Chronic systolic congestive heart failure (HCC)   . Vascular headache   . Anxiety state   . 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 Acute myopericarditis   . SAH (subarachnoid hemorrhage) (HCC)   . Tobacco abuse   . Chronic pain syndrome   . Prediabetes   . Nonischemic cardiomyopathy (HCC)   . AICD (automatic cardioverter/defibrillator) present   . Acute ischemic right MCA stroke (HCC) 10/07/2020  . Middle  cerebral artery embolism, right 10/07/2020  . Chest pain 11/16/2017   Madason Rauls T Arne Cleveland, OTR/L, CLT  Evelin Cake 12/04/2020, 6:21 PM  [place] [facility] MAIN [facility] SERVICES [address] [place], [place], [address] Phone: [phone]   Fax:  [phone]  Name: Rebecca Singh MRN: [id number] Date of Birth: [birth year]

## 2020-11-24 ENCOUNTER — Other Ambulatory Visit: Payer: Self-pay

## 2020-11-24 ENCOUNTER — Ambulatory Visit: Payer: Medicare HMO

## 2020-11-24 DIAGNOSIS — I63511 Cerebral infarction due to unspecified occlusion or stenosis of right middle cerebral artery: Secondary | ICD-10-CM | POA: Diagnosis not present

## 2020-11-24 DIAGNOSIS — R2681 Unsteadiness on feet: Secondary | ICD-10-CM

## 2020-11-24 DIAGNOSIS — R269 Unspecified abnormalities of gait and mobility: Secondary | ICD-10-CM

## 2020-11-24 DIAGNOSIS — R278 Other lack of coordination: Secondary | ICD-10-CM

## 2020-11-24 DIAGNOSIS — M6281 Muscle weakness (generalized): Secondary | ICD-10-CM

## 2020-11-24 NOTE — Therapy (Signed)
[place] [facility] MAIN [facility] SERVICES [address] [place], [place], [address] Phone: [phone]   Fax:  [phone]  Physical Therapy Treatment  Patient Details  Name: Rebecca Singh MRN: [id number] Date of Birth: 1956-07-08 Referring Provider (PT): Mariam Dollar   Encounter Date: 11/24/2020   PT End of Session - 11/24/20 2222    Visit Number 2    Number of Visits 25    Date for PT Re-Evaluation 02/04/21    PT Start Time 1016    PT Stop Time 1100    PT Time Calculation (min) 44 min    Equipment Utilized During Treatment Gait belt    Activity Tolerance Patient tolerated treatment well;Other (comment)   some tests not performed due to patient needing to be in treatment room for evaluation due to overstimulation out in clinic   Behavior During Therapy --   Patient with some agitation during evaluation          Past Medical History:  Diagnosis Date  . AICD (automatic cardioverter/defibrillator) present   . COPD (chronic obstructive pulmonary disease) (HCC)   . Hypothyroidism   . Myocardial infarction (HCC)   . Pacemaker/defibrillator   . Stroke [facility])     Past Surgical History:  Procedure Laterality Date  . ABDOMINAL HYSTERECTOMY    . CHOLECYSTECTOMY    . INSERT / REPLACE / REMOVE PACEMAKER    . IR CT HEAD LTD  10/07/2020  . IR PERCUTANEOUS ART THROMBECTOMY/INFUSION INTRACRANIAL INC DIAG ANGIO  10/07/2020  . RADIOLOGY WITH ANESTHESIA N/A 10/07/2020   Procedure: IR WITH ANESTHESIA - CODE STROKE;  Surgeon: Radiologist, Medication, MD;  Location: [facility] OR;  Service: Radiology;  Laterality: N/A;  . THYROIDECTOMY    . TONSILLECTOMY      There were no vitals filed for this visit.   Subjective Assessment - 11/24/20 2224    Subjective Patient reports that she has not been very active with minimal walking and no exercise plan. Reports feeling okay today but states her balance is not good. Reports using a walker at home and tries to be active  including performing some laundry and washing dishes. She reports her goal is return to work at [employer].    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 unrestricted    How long can you stand comfortably? 2-3 mins    How long can you walk comfortably? 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 No/denies            Assessed 10 MWT- See goals- paitent utilized a front wheeled walker with shuffling, short reciprocal steps today, CGA and use of gait belt. = 0.7 m/s  Patient attempted BERG balance test but after assessed transfer and asked patient to perform static standing- she became unsteady and requested to stop the test.  She was agreeable to instruction in Seated LE strengthening exercises today:  Seated hip march Seated Hip abd Seated knee ext Seated heel raises Seated toe raises  Sit to stand with UE support x6- otherwise all other exercises were performed at 10 reps each leg with encouragement and VC and visual demo. Patient  reported fatigue but able to perform all above exercises. Issued handout with instruction to perform 10 reps of each exercises on each leg up to 3sets of 10 reps and 5x/week as able. Patient verbalized understanding of handout and all above threx today.                          PT Education - 11/24/20 2226    Education Details Instructed in seated LE strengthening program for HEP today.    Person(s) Educated Patient;Caregiver(s)    Methods Explanation;Demonstration;Tactile cues;Verbal cues;Handout    Comprehension Verbalized understanding;Tactile cues required;Returned  demonstration;Need further instruction;Verbal cues required            PT Short Term Goals - 11/12/20 1002      PT SHORT TERM GOAL #1   Title Patient will be independent in home exercise program to improve strength/mobility for better functional independence with ADLs.    Status New    Target Date 02/04/21             PT Long Term Goals - 11/24/20 2230      PT LONG TERM GOAL #1   Title Patient will increase FOTO score to equal to or greater than 62% to demonstrate statistically significant improvement in mobility and quality of life.    Baseline 11/12/20 62%    Time 12    Period Weeks    Status New    Target Date 02/04/21      PT LONG TERM GOAL #2   Title Patient (> 58 years old) will complete five times sit to stand test in < 15 seconds indicating an increased LE strength and improved balance.    Baseline 11/12/20 28.82    Time 12    Period Weeks    Status New    Target Date 02/04/21      PT LONG TERM GOAL #3   Title Patient will increase 10 meter walk test to >1.77m/s as to improve gait speed for better community ambulation and to reduce fall risk.    Baseline 11/12/20  not assessed. 11/24/2020= 0.7 m/s using front wheeled walker    Time 12    Period Weeks    Status New      PT LONG TERM GOAL #4   Title Patient will increase Berg Balance score to at least 45/56  points to demonstrate decreased fall risk during functional activities.    Baseline 11/12/20  not assessed. 11/24/2020- Patient declined to assess    Time 12    Period Weeks    Status New    Target Date 02/04/21                 Plan - 11/24/20 2227    Clinical Impression Statement Patient demonstrated decreased gait speed as measured by the 10 MWT= 0.7 m/s using a front wheeled walker today. She declined to test the [facility] balance test after attempting static stand and was very unsteady. She was agreeable to learn a basic LE seated therex program and perform well with good carryover of instruction today. The  patient continues to benefit from additional skilled PT services to improved strength and balance for improved gait abilities and improve quality of life.    Personal Factors and Comorbidities Comorbidity 3+    Comorbidities Left TKA 10/21, pacemaker, COPD, HTN, prediabetic    Examination-Activity Limitations Bathing;Dressing;Transfers;Lift;Squat;Locomotion Level;Stairs;Carry;Stand    Examination-Participation Restrictions Cleaning;Community Activity;Driving;Meal Prep;Occupation;Shop    Stability/Clinical Decision Making  Evolving/Moderate complexity    Rehab Potential Good    PT Frequency 2x / week    PT Duration 12 weeks    PT Treatment/Interventions ADLs/Self Care Home Management;Gait training;Stair training;Functional mobility training;Therapeutic activities;Therapeutic exercise;Balance training;Neuromuscular re-education;Patient/family education;Visual/perceptual remediation/compensation    PT Next Visit Plan Review HEP exercises and initiate balance training as appropriate.    PT Home Exercise Plan Issued seated HEP for LE strengthening today.    Consulted and Agree with Plan of Care Patient;Family member/caregiver    Family Member Consulted niece           Patient will benefit from skilled therapeutic intervention in order to improve the following deficits and impairments:  Abnormal gait,Decreased balance,Decreased endurance,Decreased mobility,Difficulty walking,Decreased range of motion,Decreased activity tolerance,Decreased strength,Impaired flexibility,Impaired UE functional use  Visit Diagnosis: Muscle weakness (generalized)  Unsteadiness on feet  Other lack of coordination  Abnormality of gait and mobility     Problem List Patient Active Problem List   Diagnosis Date Noted  . Loose stools   . Visual disturbance   . AKI (acute kidney injury) (HCC)   . Hemiparesis affecting left side as late effect of stroke (HCC)   . Leukocytosis   . Urge incontinence   . Chronic  systolic congestive heart failure (HCC)   . Vascular headache   . Anxiety state   . 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 Acute myopericarditis   . SAH (subarachnoid hemorrhage) (HCC)   . Tobacco abuse   . Chronic pain syndrome   . Prediabetes   . Nonischemic cardiomyopathy (HCC)   . AICD (automatic cardioverter/defibrillator) present   . Acute ischemic right MCA stroke (HCC) 10/07/2020  . Middle cerebral artery embolism, right 10/07/2020  . Chest pain 11/16/2017    Lenda Kelp, PT  11/24/2020, 10:33 PM  [facility] [facility] MAIN [facility] SERVICES [address] [place], [place], [address] Phone: [phone]   Fax:  [phone]  Name: LINNET BOTTARI MRN: [id number] Date of Birth: 08-02-1956

## 2020-11-25 ENCOUNTER — Ambulatory Visit (INDEPENDENT_AMBULATORY_CARE_PROVIDER_SITE_OTHER): Payer: Medicare HMO | Admitting: Adult Health

## 2020-11-25 ENCOUNTER — Encounter: Payer: Self-pay | Admitting: Adult Health

## 2020-11-25 ENCOUNTER — Other Ambulatory Visit: Payer: Self-pay

## 2020-11-25 VITALS — BP 137/92 | HR 62 | Ht 65.0 in | Wt 209.0 lb

## 2020-11-25 DIAGNOSIS — G8194 Hemiplegia, unspecified affecting left nondominant side: Secondary | ICD-10-CM

## 2020-11-25 DIAGNOSIS — G441 Vascular headache, not elsewhere classified: Secondary | ICD-10-CM

## 2020-11-25 DIAGNOSIS — I63411 Cerebral infarction due to embolism of right middle cerebral artery: Secondary | ICD-10-CM | POA: Diagnosis not present

## 2020-11-25 MED ORDER — TOPIRAMATE 50 MG PO TABS
50.0000 mg | ORAL_TABLET | Freq: Two times a day (BID) | ORAL | 3 refills | Status: DC
Start: 2020-11-25 — End: 2021-04-22

## 2020-11-25 MED ORDER — NURTEC 75 MG PO TBDP: 75.0000 mg | ORAL_TABLET | Freq: Every day | ORAL | 0 refills | Status: DC

## 2020-11-25 MED ORDER — AMITRIPTYLINE HCL 10 MG PO TABS: 10.0000 mg | ORAL_TABLET | Freq: Every day | ORAL | 3 refills | Status: DC

## 2020-11-25 NOTE — Patient Instructions (Signed)
Continue working with therapies for hopefully ongoing recovery  Start amitriptyline 10mg  nightly for headaches, insomnia and anxiety  Use Nurtec 75mg  daily for 8 days - LIMIT use of tylenol   Continue topamax at this time but hopefully will be able to stop in the near future  Continue aspirin 81 mg daily  and atorvastatin  for secondary stroke prevention  Continue to follow up with PCP regarding cholesterol and blood pressure management  Maintain strict control of hypertension with blood pressure goal below 130/90 and cholesterol with LDL cholesterol (bad cholesterol) goal below 70 mg/dL.       Followup in the future with me in 3 months or call earlier if needed       Thank you for coming to see at [facility]. I hope we have been able to provide you high quality care today.  You may receive a patient satisfaction survey over the next few weeks. We would appreciate your feedback and comments so that we may continue to improve ourselves and the health of our patients.

## 2020-11-25 NOTE — Progress Notes (Signed)
[facility] [address] [place] [address] [phone]       HOSPITAL FOLLOW UP NOTE  Rebecca Singh Date of Birth:  [birth year] Medical Record Number:  [id number]   Reason for Referral:  hospital stroke follow up    SUBJECTIVE:   CHIEF COMPLAINT:  Chief Complaint  Patient presents with  . 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  She presented to [facility] ED on 10/07/2020 with severe headache, left-sided weakness and left facial droop.  Personally reviewed hospitalization pertinent progress notes, lab work and imaging with summary provided.  Initially evaluated by Dr. Roda Shutters with stroke work-up revealing acute ischemic right MCA stroke with right M2/MCA s/p IR with TICI 2C revascularization, concerning for cardioembolic source given cardiac history.  Unable to obtain MRI due to recent pacemaker switch.  ICD interrogation negative for A. fib.  2D echo showed EF 30 to 35%.  LDL 65.  A1c 6.1.  Current tobacco use of smoking cessation counseling provided.  Other stroke risk factors include nonischemic cardiomyopathy, myopericarditis, reported medication noncompliance, advanced age, obesity and prior stroke history.   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  Code Stroke CT head: Hypodensity right frontal parietal lobe suspicious for acute infarct.In addition, hyperdense sign at right sylvian fissure MCA  CTA  head &neck : Right M2/MCA posterior division branch occlusion with moderate distal branch opacification via leptomeningeal collaterals.   MRI: Contraindicated due to recent pacemaker switch, per MRI technician need a minimum of 6 weeks from procedure. (09/25/2020)  IR - R M2 inferior division TICI 2c revasc  CT head post op 1/25 - unchanged  CT repeat 10/09/2020:Subacute right frontoparietal and right temporal infarct. SAH within the right sylvian fissure is unchanged  CT repeat 10/13/2020: Right MCA distribution infarct stable in size. No evidence for hemorrhagic transformation. Decrease hypodensity within the right sylvian fissure.  2D Echo-EF 30 to 35%.  ICD interrogation no afib - new ICD from 09/25/20  LDL 65  HgbA1c: 6.1  VTE prophylaxis -SCDs  aspirin 81 mg dailyprior to admission, now onaspirin 81 mg daily, started on 10/13/2020 after repeat CT scan  Topamax 25 mg twice daily for headache  Therapy recommendations: CIR   Disposition:Today to CIR   Today, 11/25/2020, [name] is being seen for hospital follow-up accompanied by her niece, [name]  Reports residual left sided weakness with sensory impairment, visual impairment, cognitive slowing, sensory impairment, and gait impairment -currently working with [facility] PT/OT reporting continued improvement Use of RW for short distance and w/c for long distance -denies any recent falls Her greatest concern today is in regards to continued headaches which were present during admission -compliant on topiramate 50 mg twice daily.  Denies benefit with Fioricet.  FREQUENT use of tylenol - approx 8-9 500mg  tabs/day.  Reports headaches consistently throughout the day associated with photophobia and phonophobia Also struggling with increased anxiety and agitation -underlying history of anxiety/depression not currently on medication management - feels anxiety related  to current medical condition and residual stroke  deficits Denies new stroke/TIA symptoms  Compliant on aspirin 81 mg daily and atorvastatin 40 mg daily -denies side effects Blood pressure today 137/92 Reports complete tobacco cessation since hospital discharge  No further concerns at this time     ROS:   14 system review of systems performed and negative with exception of those listed in HPI  PMH:  Past Medical History:  Diagnosis Date  . AICD (automatic cardioverter/defibrillator) present   . COPD (chronic obstructive pulmonary disease) (HCC)   . Hypothyroidism   . Myocardial infarction (HCC)   . Pacemaker/defibrillator   . Stroke [facility])     PSH:  Past Surgical History:  Procedure Laterality Date  . ABDOMINAL HYSTERECTOMY    . CHOLECYSTECTOMY    . INSERT / REPLACE / REMOVE PACEMAKER    . IR CT HEAD LTD  10/07/2020  . IR PERCUTANEOUS ART THROMBECTOMY/INFUSION INTRACRANIAL INC DIAG ANGIO  10/07/2020  . RADIOLOGY WITH ANESTHESIA N/A 10/07/2020   Procedure: IR WITH ANESTHESIA - CODE STROKE;  Surgeon: Radiologist, Medication, MD;  Location: [facility] OR;  Service: Radiology;  Laterality: N/A;  . THYROIDECTOMY    . TONSILLECTOMY      Social History:  Social History   Socioeconomic History  . Marital status: Widowed    Spouse name: Not on file  . Number of children: Not on file  . Years of education: Not on file  . Highest education level: Not on file  Occupational History  . Not on file  Tobacco Use  . Smoking status: Former Smoker    Packs/day: 1.00    Years: 15.00    Pack years: 15.00    Types: Cigarettes  . Smokeless tobacco: Never Used  Vaping Use  . Vaping Use: Never used  Substance and Sexual Activity  . Alcohol use: No  . Drug use: No  . Sexual activity: Not Currently  Other Topics Concern  . Not on file  Social History Narrative  . Not on file   Social Determinants of Health   Financial Resource Strain: Not on file  Food Insecurity: Not on file  Transportation Needs: Not on file  Physical Activity:  Not on file  Stress: Not on file  Social Connections: Not on file  Intimate Partner Violence: Not on file    Family History:  Family History  Problem Relation Age of Onset  . Diabetes Mother   . Heart disease Mother 95       stent placed   . Macular degeneration Father   . Diabetes Brother   . Diabetes Brother     Medications:   Current Outpatient Medications on File Prior to Visit  Medication Sig Dispense Refill  . acetaminophen (TYLENOL) 325 MG tablet Take 2 tablets (650 mg total) by mouth every 4 (four) hours as needed for mild pain (or temp > 37.5 C (99.5 F)).    Marland Kitchen aspirin EC 81 MG EC tablet Take 1 tablet (81 mg total) by mouth daily. Swallow whole. 30 tablet 11  . atorvastatin (LIPITOR) 40 MG tablet Take 1 tablet by mouth daily.    . butalbital-acetaminophen-caffeine (FIORICET) 50-325-40 MG tablet Take 2 tablets by mouth every 8 (eight) hours as needed for headache. 14 tablet 0  . levothyroxine (SYNTHROID) 125 MCG tablet Take 1 tablet by mouth daily.    Marland Kitchen loperamide (IMODIUM) 2 MG capsule Take 1 capsule (2 mg total) by mouth daily. 30 capsule 0  . losartan (COZAAR) 25 MG  tablet Take 1 tablet by mouth daily.    . metoprolol succinate (TOPROL XL) 25 MG 24 hr tablet Take 1 tablet (25 mg total) by mouth daily. 30 tablet 5  . nicotine (NICODERM CQ - DOSED IN MG/24 HOURS) 14 mg/24hr patch 14 mg patch daily x3 weeks then 7 mg patch daily x3 weeks and stop 28 patch 0  . omeprazole (PRILOSEC) 40 MG capsule Take 1 capsule by mouth daily.    Marland Kitchen topiramate (TOPAMAX) 50 MG tablet Take 1 tablet (50 mg total) by mouth 2 (two) times daily. 60 tablet 0   No current facility-administered medications on file prior to visit.    Allergies:   Allergies  Allergen Reactions  . Codeine Shortness Of Breath  . Aspirin Other (See Comments)    "burns" the patient's stomach  . Latex Other (See Comments)    Makes the "skin peel"   . Other Nausea Only and Other (See Comments)    Spicy foods- "Upset  the patient's stomach"      OBJECTIVE:  Physical Exam  Vitals:   11/25/20 1311  BP: (!) 137/92  Pulse: 62  Weight: 209 lb (94.8 kg)  Height: 5\' 5"  (1.651 m)   Body mass index is 34.78 kg/m. No exam data present   General: well developed, well nourished,  pleasant middle-aged Caucasian female, seated, in no evident distress Head: head normocephalic and atraumatic.   Neck: supple with no carotid or supraclavicular bruits Cardiovascular: regular rate and rhythm, no murmurs Musculoskeletal: no deformity Skin:  no rash/petichiae Vascular:  Normal pulses all extremities   Neurologic Exam Mental Status: Awake and fully alert.   Fluent speech and language.  Oriented to place and time. Recent memory subjectively impaired and remote memory intact. Attention span, concentration and fund of knowledge appropriate appropriate during visit. Mood and affect appropriate.  Cranial Nerves: Fundoscopic exam reveals sharp disc margins. Pupils equal, briskly reactive to light. Extraocular movements full without nystagmus. Visual fields full to confrontation. Hearing intact. Facial sensation intact. Face, tongue, palate moves normally and symmetrically.  Motor: Normal strength, bulk and tone on right upper and lower extremity.  LUE: 3/5 with noted giveaway weakness and questionable participation effort LLE: 4/5 with noted giveaway weakness and questionable participation effort Sensory.:  Decreased sensory left upper and lower extremity Coordination: Rapid alternating movements normal in all extremities except decreased left hand. Finger-to-nose and heel-to-shin performed accurately on right side. Gait and Station: Deferred as RW not present at visit Reflexes: 1+ and symmetric. Toes downgoing.     NIHSS  3 Modified Rankin  3      ASSESSMENT: Rebecca Singh is a 65 y.o. year old female presented with headache, left-sided weakness and left facial droop on 10/07/2020 with stroke work-up  revealing acute ischemic right MCA infarct with right M2/MCA posterior division branch occlusion s/p IR with TICI 2C reperfusion, concerning for cardioembolic source given extensive cardiac history. Vascular risk factors include polysubstance abuse, chronic pain, nonischemic cardiomyopathy (recent EF 30 to 35%), s/p chamber replacement for pacemaker 09/25/2020, history of postpartum stroke, tobacco use, HTN, pre-DM and obesity.      PLAN:  1. R MCA stroke:  a. Residual deficit: Left hemiparesis with sensory impairment, visual complaints, cognitive difficulty and headaches.  Continue working with PT/OT [facility] for hopeful ongoing recovery.  Consider participation in SLP in the future (currently on hold to focus on PT/OT) b. Continue aspirin 81 mg daily for secondary stroke prevention.   c. Discussed secondary stroke  prevention measures and importance of close PCP follow up for aggressive stroke risk factor management  2. Vascular headache:  a. hx of migraine but none recently prior to stroke.  Some migrainous features mixed with tension type features.   b. Limited benefit with use of topiramate 50 mg twice daily and no benefit with Fioricet.  Hesitant to increase topiramate dosage further due to current cognitive complaints.   c. Start amitriptyline 10 mg nightly which will hopefully be of benefit for headaches, anxiety and sleep difficulty.   d. Advised to continue topiramate and will discuss discontinuing at follow-up visit.   e. Recommend Nurtec for 6 days to hopefully break cycle.  If beneficial, will consider Nurtecfor emergent need in the future.   f. Discussed limiting Tylenol due to rebound headaches 3. HTN: BP goal <130/90.  Stable on current regimen per cardiology 4. Pre-DMII: A1c goal<7.0. Recent A1c 6.1.  Monitor by PCP 5. Nonischemic cardiomyopathy/CHF: Routine follow-up with cardiology 6. Tobacco use: Congratulated on complete tobacco cessation and highly encouraged  continued abstinence    Follow up in 3 months or call earlier if needed   CC:  [facility] provider: Dr. Pearlean Brownie PCP: [facility]    I spent 50 minutes of face-to-face and non-face-to-face time with patient and niece.  This included previsit chart review including recent hospitalization progress notes, lab work and imaging, lab review, study review, order entry, electronic health record documentation, patient education and discussion regarding recent stroke and possible etiologies, residual deficits, vascular headache and further treatment options, importance of managing stroke risk factors and answered all other questions to patient satisfaction  Ihor Austin, AGNP-BC  [facility] [address] [place], [place] [address]  Phone [phone] Fax [phone] Note: This document was prepared with digital dictation and possible smart phrase technology. Any transcriptional errors that result from this process are unintentional.

## 2020-11-27 ENCOUNTER — Ambulatory Visit: Payer: Medicare HMO

## 2020-11-27 ENCOUNTER — Other Ambulatory Visit: Payer: Self-pay

## 2020-11-27 DIAGNOSIS — R2681 Unsteadiness on feet: Secondary | ICD-10-CM

## 2020-11-27 DIAGNOSIS — R269 Unspecified abnormalities of gait and mobility: Secondary | ICD-10-CM

## 2020-11-27 DIAGNOSIS — R278 Other lack of coordination: Secondary | ICD-10-CM

## 2020-11-27 DIAGNOSIS — M6281 Muscle weakness (generalized): Secondary | ICD-10-CM

## 2020-11-27 DIAGNOSIS — I63511 Cerebral infarction due to unspecified occlusion or stenosis of right middle cerebral artery: Secondary | ICD-10-CM | POA: Diagnosis not present

## 2020-11-27 NOTE — Therapy (Signed)
[facility] MAIN [facility] SERVICES [address] [place], [place], [address] Phone: [phone]   Fax:  [phone]  Physical Therapy Treatment  Patient Details  Name: Rebecca Singh MRN: [id number] Date of Birth: 1955/12/03 Referring Provider (PT): Mariam Dollar   Encounter Date: 11/27/2020   PT End of Session - 11/27/20 1955    Visit Number 3    Number of Visits 25    Date for PT Re-Evaluation 02/04/21    PT Start Time 1300    PT Stop Time 1344    PT Time Calculation (min) 44 min    Equipment Utilized During Treatment Gait belt    Activity Tolerance Patient tolerated treatment well;Other (comment)   some tests not performed due to patient needing to be in treatment room for evaluation due to overstimulation out in clinic   Behavior During Therapy [facility] for tasks assessed/performed   Patient with some agitation during evaluation          Past Medical History:  Diagnosis Date  . AICD (automatic cardioverter/defibrillator) present   . COPD (chronic obstructive pulmonary disease) (HCC)   . Hypothyroidism   . Myocardial infarction (HCC)   . Pacemaker/defibrillator   . Stroke [facility])     Past Surgical History:  Procedure Laterality Date  . ABDOMINAL HYSTERECTOMY    . CHOLECYSTECTOMY    . INSERT / REPLACE / REMOVE PACEMAKER    . IR CT HEAD LTD  10/07/2020  . IR PERCUTANEOUS ART THROMBECTOMY/INFUSION INTRACRANIAL INC DIAG ANGIO  10/07/2020  . RADIOLOGY WITH ANESTHESIA N/A 10/07/2020   Procedure: IR WITH ANESTHESIA - CODE STROKE;  Surgeon: Radiologist, Medication, MD;  Location: [facility] OR;  Service: Radiology;  Laterality: N/A;  . THYROIDECTOMY    . TONSILLECTOMY       Therapeutic exercises: Review of home program from last visit   Subjective Assessment - 11/27/20 1952    Subjective Patient reports that she has been compliant with her prescribed exercises and doing well today.    Patient is accompained by: Family member    Pertinent  History polysubstance abuse, pacemaker, L TKA, ongoing tobacco abuse, chronic pain, nonischemic cardiomyopathy, obsese  peripartum CVA with no residual deficits, COPD, prediabetes    Limitations Walking;Standing;House hold activities;Lifting    How long can you sit comfortably? unrestricted    How long can you stand comfortably? 2-3 mins    How long can you walk comfortably? 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 No/denies         Seated hip march Seated Hip abd Seated knee ext Seated heel raises Seated toe raises  Sit to stand with UE support   Patient performed 10 reps BLE and able to perform sit to stand well today with no significant difficulty  Gait training:   Patient ambulated approx 120 feet then another 100 feet using her front wheeled walker, CGA- initially with short, shuffling steps with decreased step length and minimal toe clearance. PT demo correct heel to toe sequencing prior to patient performing second trial and then she was able to demo minimally improved heel strike but exhibited increased difficulty and frustration - She did verbalize understanding of importance of sequencing and stated she "would work on it."  Heel strike to toe off training - static stand on right LE and then positioned 2 cones (1 in front and 1 behind for feedback of each  foot placement) and instructed patient in sequencing- She exhibited difficulty performing x 20 reps but did improve with practice.     Clinical impression: Patient performed well with review of seated LE strengthening requiring cues to slow down and educated in rationale for each exercise and the role each muscle group plays in standing/walking. She then ambulated with some difficulty with sequencing and unable to produce heel to toe sequencing even with VC and visual demo. The patient will continue to benefit from additional skilled PT services to improved strength and  balance for improved gait abilities and improve quality of life                      PT Education - 11/27/20 1954    Education Details specific exercise cues for safety with technique. Gait sequencing cues for safety.    Person(s) Educated Patient    Methods Explanation;Demonstration;Verbal cues    Comprehension Verbalized understanding;Returned demonstration;Need further instruction;Verbal cues required            PT Short Term Goals - 11/12/20 1002      PT SHORT TERM GOAL #1   Title Patient will be independent in home exercise program to improve strength/mobility for better functional independence with ADLs.    Status New    Target Date 02/04/21             PT Long Term Goals - 11/24/20 2230      PT LONG TERM GOAL #1   Title Patient will increase FOTO score to equal to or greater than 62% to demonstrate statistically significant improvement in mobility and quality of life.    Baseline 11/12/20 62%    Time 12    Period Weeks    Status New    Target Date 02/04/21      PT LONG TERM GOAL #2   Title Patient (> 49 years old) will complete five times sit to stand test in < 15 seconds indicating an increased LE strength and improved balance.    Baseline 11/12/20 28.82    Time 12    Period Weeks    Status New    Target Date 02/04/21      PT LONG TERM GOAL #3   Title Patient will increase 10 meter walk test to >1.12m/s as to improve gait speed for better community ambulation and to reduce fall risk.    Baseline 11/12/20  not assessed. 11/24/2020= 0.7 m/s using front wheeled walker    Time 12    Period Weeks    Status New      PT LONG TERM GOAL #4   Title Patient will increase Berg Balance score to at least 45/56  points to demonstrate decreased fall risk during functional activities.    Baseline 11/12/20  not assessed. 11/24/2020- Patient declined to assess    Time 12    Period Weeks    Status New    Target Date 02/04/21           Seated hip march Seated  Hip abd Seated knee ext Seated heel raises Seated toe raises  Sit to stand with UE support x6- otherwise all other exercises were performed at 10 reps each leg with encouragement and VC and visual demo. Patient reported fatigue but able to perform all above exercises. Issued handout with instruction to perform 10 reps of each exercises on each leg up to 3sets of 10 reps and 5x/week as able. Patient verbalized understanding of handout and  all above threx today.           Plan - 11/27/20 1956    Clinical Impression Statement Patient performed well with review of seated LE strengthening requiring cues to slow down and educated in rationale for each exercise and the role each muscle group plays in standing/walking. She then ambulated with some difficulty with sequencing and unable to produce heel to toe sequencing even with VC and visual demo. The patient will continue to benefit from additional skilled PT services to improved strength and balance for improved gait abilities and improve quality of life    Personal Factors and Comorbidities Comorbidity 3+    Comorbidities Left TKA 10/21, pacemaker, COPD, HTN, prediabetic    Examination-Activity Limitations Bathing;Dressing;Transfers;Lift;Squat;Locomotion Level;Stairs;Carry;Stand    Examination-Participation Restrictions Cleaning;Community Activity;Driving;Meal Prep;Occupation;Shop    Stability/Clinical Decision Making Evolving/Moderate complexity    Rehab Potential Good    PT Frequency 2x / week    PT Duration 12 weeks    PT Treatment/Interventions ADLs/Self Care Home Management;Gait training;Stair training;Functional mobility training;Therapeutic activities;Therapeutic exercise;Balance training;Neuromuscular re-education;Patient/family education;Visual/perceptual remediation/compensation    PT Next Visit Plan Review HEP exercises and initiate balance training as appropriate.    PT Home Exercise Plan --    Consulted and Agree with Plan of Care  Patient;Family member/caregiver    Family Member Consulted niece           Patient will benefit from skilled therapeutic intervention in order to improve the following deficits and impairments:  Abnormal gait,Decreased balance,Decreased endurance,Decreased mobility,Difficulty walking,Decreased range of motion,Decreased activity tolerance,Decreased strength,Impaired flexibility,Impaired UE functional use  Visit Diagnosis: Muscle weakness (generalized)  Unsteadiness on feet  Other lack of coordination  Abnormality of gait and mobility     Problem List Patient Active Problem List   Diagnosis Date Noted  . Loose stools   . Visual disturbance   . AKI (acute kidney injury) (HCC)   . Hemiparesis affecting left side as late effect of stroke (HCC)   . Leukocytosis   . Urge incontinence   . Chronic systolic congestive heart failure (HCC)   . Vascular headache   . Anxiety state   . 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 Acute myopericarditis   . SAH (subarachnoid hemorrhage) (HCC)   . Tobacco abuse   . Chronic pain syndrome   . Prediabetes   . Nonischemic cardiomyopathy (HCC)   . AICD (automatic cardioverter/defibrillator) present   . Acute ischemic right MCA stroke (HCC) 10/07/2020  . Middle cerebral artery embolism, right 10/07/2020  . Chest pain 11/16/2017    Lenda Kelp, PT 11/27/2020, 8:10 PM  [facility] MAIN [facility] SERVICES [address] [place], [place], [address] Phone: [phone]   Fax:  [phone]  Name: Rebecca Singh MRN: [id number] Date of Birth: 1955/10/08

## 2020-11-30 NOTE — Progress Notes (Signed)
I agree with the above plan 

## 2020-12-01 ENCOUNTER — Other Ambulatory Visit: Payer: Self-pay

## 2020-12-01 ENCOUNTER — Ambulatory Visit: Payer: Medicare HMO

## 2020-12-01 ENCOUNTER — Encounter: Payer: Self-pay | Admitting: Adult Health

## 2020-12-01 DIAGNOSIS — R278 Other lack of coordination: Secondary | ICD-10-CM

## 2020-12-01 DIAGNOSIS — I63511 Cerebral infarction due to unspecified occlusion or stenosis of right middle cerebral artery: Secondary | ICD-10-CM | POA: Diagnosis not present

## 2020-12-01 DIAGNOSIS — R2681 Unsteadiness on feet: Secondary | ICD-10-CM

## 2020-12-01 DIAGNOSIS — R269 Unspecified abnormalities of gait and mobility: Secondary | ICD-10-CM

## 2020-12-01 DIAGNOSIS — M6281 Muscle weakness (generalized): Secondary | ICD-10-CM

## 2020-12-01 NOTE — Therapy (Signed)
[facility] MAIN [facility] SERVICES [address] [place], [place], [address] Phone: [phone]   Fax:  [phone]  Physical Therapy Treatment  Patient Details  Name: Rebecca Singh MRN: [id number] Date of Birth: 10/25/1955 Referring Provider (PT): Mariam Dollar   Encounter Date: 12/01/2020   PT End of Session - 12/01/20 1024    Visit Number 4    Number of Visits 25    Date for PT Re-Evaluation 02/04/21    PT Start Time 1017    PT Stop Time 1058    PT Time Calculation (min) 41 min    Equipment Utilized During Treatment Gait belt    Activity Tolerance Patient tolerated treatment well;Other (comment)   some tests not performed due to patient needing to be in treatment room for evaluation due to overstimulation out in clinic   Behavior During Therapy [facility] for tasks assessed/performed   Patient with some agitation during evaluation          Past Medical History:  Diagnosis Date  . AICD (automatic cardioverter/defibrillator) present   . COPD (chronic obstructive pulmonary disease) (HCC)   . Hypothyroidism   . Myocardial infarction (HCC)   . Pacemaker/defibrillator   . Stroke [facility])     Past Surgical History:  Procedure Laterality Date  . ABDOMINAL HYSTERECTOMY    . CHOLECYSTECTOMY    . INSERT / REPLACE / REMOVE PACEMAKER    . IR CT HEAD LTD  10/07/2020  . IR PERCUTANEOUS ART THROMBECTOMY/INFUSION INTRACRANIAL INC DIAG ANGIO  10/07/2020  . RADIOLOGY WITH ANESTHESIA N/A 10/07/2020   Procedure: IR WITH ANESTHESIA - CODE STROKE;  Surgeon: Radiologist, Medication, MD;  Location: [facility] OR;  Service: Radiology;  Laterality: N/A;  . THYROIDECTOMY    . TONSILLECTOMY      There were no vitals filed for this visit.   Subjective Assessment - 12/01/20 1023    Subjective Patient reports having some ongoing migraine headaches but otherwise feeling okay.    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 unrestricted    How long can you stand comfortably? 2-3 mins    How long can you walk comfortably? 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 No/denies            Instructed patient in standing LE strengthening exercises at bar today:   Stand hip march Stand hip ext Stand hip abd Stand knee flex Stand [place] Stand calf raises  10 reps each exercise BLE with VCs and visual demonstration for exercise technique. Patient able to complete safely with minimal rest breaks today. VC to slow down and hold for 2 sec. Patient denied any pain or significant difficulty with therex. She required increased time for instruction and time to complete tasks.   Clinical Impression: Patient responded favorably to instruction in standing therex today without report of headache or pain and able to use bar for UE support and no unsteadiness- just fatigue as limiting factor. Patient responded well to [facility] and visual demo for safe carry over of instruction with therex. She will benefit from review of exercises next visit and continued PT services to continue to progress her overall  strength and balance for improved functional mobility and independence with decreased risk of falling and improved quality of life.                          PT Education - 12/01/20 1655    Education Details Educated in standing LE strengthening exercise form today.    Person(s) Educated Patient    Methods Explanation;Demonstration;Verbal cues    Comprehension Verbalized understanding;Returned demonstration;Verbal cues required            PT Short Term Goals - 11/12/20 1002      PT SHORT TERM GOAL #1   Title  Patient will be independent in home exercise program to improve strength/mobility for better functional independence with ADLs.    Status New    Target Date 02/04/21             PT Long Term Goals - 11/24/20 2230      PT LONG TERM GOAL #1   Title Patient will increase FOTO score to equal to or greater than 62% to demonstrate statistically significant improvement in mobility and quality of life.    Baseline 11/12/20 62%    Time 12    Period Weeks    Status New    Target Date 02/04/21      PT LONG TERM GOAL #2   Title Patient (> 55 years old) will complete five times sit to stand test in < 15 seconds indicating an increased LE strength and improved balance.    Baseline 11/12/20 28.82    Time 12    Period Weeks    Status New    Target Date 02/04/21      PT LONG TERM GOAL #3   Title Patient will increase 10 meter walk test to >1.32m/s as to improve gait speed for better community ambulation and to reduce fall risk.    Baseline 11/12/20  not assessed. 11/24/2020= 0.7 m/s using front wheeled walker    Time 12    Period Weeks    Status New      PT LONG TERM GOAL #4   Title Patient will increase Berg Balance score to at least 45/56  points to demonstrate decreased fall risk during functional activities.    Baseline 11/12/20  not assessed. 11/24/2020- Patient declined to assess    Time 12    Period Weeks    Status New    Target Date 02/04/21                 Plan - 12/01/20 1656    Clinical Impression Statement Patient responded favorably to instruction in standing therex today without report of headache or pain and able to use bar for UE support and no unsteadiness- just fatigue as limiting factor. Patient responded well to [facility] and visual demo for safe carry over of instruction with therex. She will benefit from review of exercises next visit and continued PT services to continue to progress her overall strength and balance for improved functional mobility and independence with  decreased risk of falling and improved quality of life.    Personal Factors and Comorbidities Comorbidity 3+    Comorbidities Left TKA 10/21, pacemaker, COPD, HTN, prediabetic    Examination-Activity Limitations Bathing;Dressing;Transfers;Lift;Squat;Locomotion Level;Stairs;Carry;Stand    Examination-Participation Restrictions Cleaning;Community Activity;Driving;Meal Prep;Occupation;Shop    Stability/Clinical Decision Making Evolving/Moderate complexity    Rehab Potential Good    PT Frequency 2x / week    PT Duration 12 weeks  PT Treatment/Interventions ADLs/Self Care Home Management;Gait training;Stair training;Functional mobility training;Therapeutic activities;Therapeutic exercise;Balance training;Neuromuscular re-education;Patient/family education;Visual/perceptual remediation/compensation    PT Next Visit Plan Review standing LE strengthening and continue with gait training as appropriate.    Consulted and Agree with Plan of Care Patient;Family member/caregiver    Family Member Consulted niece           Patient will benefit from skilled therapeutic intervention in order to improve the following deficits and impairments:  Abnormal gait,Decreased balance,Decreased endurance,Decreased mobility,Difficulty walking,Decreased range of motion,Decreased activity tolerance,Decreased strength,Impaired flexibility,Impaired UE functional use  Visit Diagnosis: Muscle weakness (generalized)  Unsteadiness on feet  Other lack of coordination  Abnormality of gait and mobility     Problem List Patient Active Problem List   Diagnosis Date Noted  . Loose stools   . Visual disturbance   . AKI (acute kidney injury) (HCC)   . Hemiparesis affecting left side as late effect of stroke (HCC)   . Leukocytosis   . Urge incontinence   . Chronic systolic congestive heart failure (HCC)   . Vascular headache   . Anxiety state   . 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 Acute  myopericarditis   . SAH (subarachnoid hemorrhage) (HCC)   . Tobacco abuse   . Chronic pain syndrome   . Prediabetes   . Nonischemic cardiomyopathy (HCC)   . AICD (automatic cardioverter/defibrillator) present   . Acute ischemic right MCA stroke (HCC) 10/07/2020  . Middle cerebral artery embolism, right 10/07/2020  . Chest pain 11/16/2017    Lenda Kelp, PT 12/01/2020, 5:05 PM  [place] [facility] MAIN [facility] SERVICES [address] [place], [place], [address] Phone: [phone]   Fax:  [phone]  Name: Rebecca Singh MRN: [id number] Date of Birth: 1956-01-17

## 2020-12-03 ENCOUNTER — Other Ambulatory Visit: Payer: Self-pay

## 2020-12-03 ENCOUNTER — Ambulatory Visit: Payer: Medicare HMO | Admitting: Occupational Therapy

## 2020-12-03 DIAGNOSIS — I63511 Cerebral infarction due to unspecified occlusion or stenosis of right middle cerebral artery: Secondary | ICD-10-CM | POA: Diagnosis not present

## 2020-12-03 DIAGNOSIS — R278 Other lack of coordination: Secondary | ICD-10-CM

## 2020-12-03 DIAGNOSIS — M6281 Muscle weakness (generalized): Secondary | ICD-10-CM

## 2020-12-03 DIAGNOSIS — R2681 Unsteadiness on feet: Secondary | ICD-10-CM

## 2020-12-03 MED ORDER — AMITRIPTYLINE HCL 25 MG PO TABS: 25.0000 mg | ORAL_TABLET | Freq: Every day | ORAL | 5 refills | Status: DC

## 2020-12-04 ENCOUNTER — Encounter: Payer: Self-pay | Admitting: Occupational Therapy

## 2020-12-04 NOTE — Addendum Note (Signed)
Addended by: Derrek Gu T on: 12/04/2020 06:27 PM   Modules accepted: Orders

## 2020-12-04 NOTE — Therapy (Signed)
[facility] MAIN [facility] SERVICES [address] [place], [place], [address] Phone: [phone]   Fax:  [phone]  Occupational Therapy Treatment  Patient Details  Name: Rebecca Singh MRN: [id number] Date of Birth: 1956-07-13 No data recorded  Encounter Date: 12/03/2020   OT End of Session - 12/04/20 1829    Visit Number 2    Number of Visits 24    Date for OT Re-Evaluation 02/13/21    OT Start Time 1300    OT Stop Time 1345    OT Time Calculation (min) 45 min    Activity Tolerance Patient tolerated treatment well    Behavior During Therapy [facility] for tasks assessed/performed           Past Medical History:  Diagnosis Date  . AICD (automatic cardioverter/defibrillator) present   . COPD (chronic obstructive pulmonary disease) (HCC)   . Hypothyroidism   . Myocardial infarction (HCC)   . Pacemaker/defibrillator   . Stroke [facility])     Past Surgical History:  Procedure Laterality Date  . ABDOMINAL HYSTERECTOMY    . CHOLECYSTECTOMY    . INSERT / REPLACE / REMOVE PACEMAKER    . IR CT HEAD LTD  10/07/2020  . IR PERCUTANEOUS ART THROMBECTOMY/INFUSION INTRACRANIAL INC DIAG ANGIO  10/07/2020  . RADIOLOGY WITH ANESTHESIA N/A 10/07/2020   Procedure: IR WITH ANESTHESIA - CODE STROKE;  Surgeon: Radiologist, Medication, MD;  Location: [facility] OR;  Service: Radiology;  Laterality: N/A;  . THYROIDECTOMY    . TONSILLECTOMY      There were no vitals filed for this visit.   Subjective Assessment - 12/04/20 1827    Subjective  Pt reports she has a headache today, rates pain as 6/10, still not sleeping well.  Went by her work at [employer] last week to see her coworkers and did well with the increased stimulation, she did go on a day that was not as busy.    Pertinent History CVA on 10-07-2020 and discharged on 10-24-2020. polysubstance abuse, pacemaker, L TKA, ongoing tobacco abuse, chronic pain, nonischemic cardiomyopathy, obsese  peripartum CVA with no residual  deficits, COPD, prediabetes    Patient Stated Goals Patient reports she wants to be independent and go back to work again.    Currently in Pain? Yes    Pain Score 6     Pain Location Head    Pain Descriptors / Indicators Aching           Pt reports she has a headache today, rates pain as 6/10, still not sleeping well.  Went by her work at [employer] last week to see her coworkers and did well with the increased stimulation, she did go on a day that was not as busy.   Neuromuscular reeducation: Patient participating in manipulation of ball pegs with left hand, requires therapist demo and cues for performance, repeated cues during task as patient would say occasionally, she forgot was she was doing.  She does have some difficulty with processing information and especially with divided attention for task. Manipulation of [place] discs flipping with left hand and then placing into a container presented at a variety of heights.  Has some difficulty reaching to place into container at various heights.   Response to tx: With Increased fatigue, patient was distracted and confused by her therapy calendar which was placed on the table next to her.  Therapist had to turn over calendar since this was too much stimulation for the patient and she could not  focus on the task fully.  Pt benefits from repetition of tasks and with repeated direction as needed.  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  Continues to demonstrate limitations with hand function and manipulation of items used in her daily routine.                       OT Education - 12/04/20 1828    Education Details ROM, coordination    Person(s) Educated Patient    Methods Explanation;Demonstration    Comprehension Verbalized understanding;Returned demonstration               OT Long Term Goals - 12/04/20 1446      OT LONG TERM GOAL #1   Title  Patient will be modified independent with home exercise program    Baseline Eval: No current program    Time 12    Period Weeks    Status New    Target Date 02/13/21      OT LONG TERM GOAL #2   Title Patient will improve grip strength of left hand by 10# to be able to stabilize and cut food with modified independence.    Baseline Eval: unable    Time 12    Period Weeks    Status New    Target Date 02/13/21      OT LONG TERM GOAL #3   Title Patient will demonstrate washing dishes in standing for up to 10 mins without rest breaks with modified independence.    Baseline Eval difficulty with task.    Time 8    Period Weeks    Status New    Target Date 01/16/21      OT LONG TERM GOAL #4   Title Patient will increase strength in left shoulder by 1 mm grade to place clothing in closet with modified independence.    Baseline Eval:  strength 2/5    Time 12    Period Weeks    Status New    Target Date 02/13/21      OT LONG TERM GOAL #5   Title Pt will improve functional hand strength to open jars and containers with occasional min assist.    Baseline Eval: unable to open jars and containers    Time 12    Period Weeks    Status New    Target Date 02/13/21      Long Term Additional Goals   Additional Long Term Goals --      OT LONG TERM GOAL #6   Title Pt will complete light meal prep with modified independence    Baseline Eval: unable    Time 12    Period Weeks    Status New    Target Date 02/13/21      OT LONG TERM GOAL #7   Title Patient will decrease 9 hole peg test  by 15 secs to manipulate small objects with modified independence and with good speed.    Baseline Eval: 50secs    Time 8    Period Weeks    Status New    Target Date 02/13/21      OT LONG TERM GOAL #8   Title Pt will improve FOTO score to 57 or above to show a clinically signifcant change function to complete daily tasks with greater independence.    Baseline Eval: 44    Time 12    Period Weeks  Status New    Target Date 02/13/21                 Plan - 12/04/20 1829    Clinical Impression Statement With Increased fatigue, patient was distracted and confused by her therapy calendar which was placed on the table next to her.  Therapist had to turn over calendar since this was too much stimulation for the patient and she could not focus on the task fully.  Pt benefits from repetition of tasks and with repeated direction as needed.  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  Continues to demonstrate limitations with hand function and manipulation of items used in her daily routine.    OT Occupational Profile and History Detailed Assessment- Review of Records and additional review of physical, cognitive, psychosocial history related to current functional performance    Occupational performance deficits (Please refer to evaluation for details): ADL's;IADL's;Work;Leisure    Body Structure / Function / Physical Skills ADL;Dexterity;Flexibility;ROM;Strength;Balance;Coordination;FMC;IADL;Endurance;Gait;Pain;UE functional use;Decreased knowledge of use of DME;GMC;Mobility    Cognitive Skills Attention;Memory;Safety Awareness    Psychosocial Skills Environmental  Adaptations;Habits;Routines and Behaviors    Rehab Potential Good    Clinical Decision Making Several treatment options, min-mod task modification necessary    Comorbidities Affecting Occupational Performance: May have comorbidities impacting occupational performance    Modification or Assistance to Complete Evaluation  No modification of tasks or assist necessary to complete eval    OT Frequency 2x / week    OT Duration 12 weeks    OT Treatment/Interventions Cryotherapy;Self-care/ADL training;Therapeutic exercise;DME and/or AE instruction;Functional Mobility Training;Cognitive remediation/compensation;Neuromuscular education;Manual Therapy;Moist Heat;Contrast  Bath;Energy conservation;Therapeutic activities;Passive range of motion;Patient/family education    Consulted and Agree with Plan of Care Patient;Family member/caregiver    Family Member Consulted daughter           Patient will benefit from skilled therapeutic intervention in order to improve the following deficits and impairments:   Body Structure / Function / Physical Skills: ADL,Dexterity,Flexibility,ROM,Strength,Balance,Coordination,FMC,IADL,Endurance,Gait,Pain,UE functional use,Decreased knowledge of use of DME,GMC,Mobility Cognitive Skills: Attention,Memory,Safety Awareness Psychosocial Skills: Environmental  Adaptations,Habits,Routines and Behaviors   Visit Diagnosis: Muscle weakness (generalized)  Other lack of coordination  Unsteadiness on feet    Problem List Patient Active Problem List   Diagnosis Date Noted  . Loose stools   . Visual disturbance   . AKI (acute kidney injury) (HCC)   . Hemiparesis affecting left side as late effect of stroke (HCC)   . Leukocytosis   . Urge incontinence   . Chronic systolic congestive heart failure (HCC)   . Vascular headache   . Anxiety state   . 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 Acute myopericarditis   . SAH (subarachnoid hemorrhage) (HCC)   . Tobacco abuse   . Chronic pain syndrome   . Prediabetes   . Nonischemic cardiomyopathy (HCC)   . AICD (automatic cardioverter/defibrillator) present   . Acute ischemic right MCA stroke (HCC) 10/07/2020  . Middle cerebral artery embolism, right 10/07/2020  . Chest pain 11/16/2017   Kerrie Buffalo, OTR/L, CLT  Rebecca Singh 12/04/2020, 6:34 PM  [facility] MAIN [facility] SERVICES [address] [place], [place], [address] Phone: [phone]   Fax:  [phone]  Name: Rebecca Singh MRN: [id number] Date of Birth: Sep 14, 1955

## 2020-12-04 NOTE — Therapy (Signed)
[place] [facility] MAIN [facility] SERVICES [address] [place], [place], [address] Phone: [phone]   Fax:  [phone]  Occupational Therapy Evaluation  Patient Details  Name: Rebecca Singh MRN: [id number] Date of Birth: 10-Dec-1955 No data recorded  Encounter Date: 11/21/2020   OT End of Session - 12/04/20 1441    Visit Number 1    Number of Visits 24    Date for OT Re-Evaluation 02/13/21    OT Start Time 1016    OT Stop Time 1115    OT Time Calculation (min) 59 min    Activity Tolerance Patient tolerated treatment well    Behavior During Therapy [facility] for tasks assessed/performed   Patient with some agitation during evaluation          Past Medical History:  Diagnosis Date  . AICD (automatic cardioverter/defibrillator) present   . COPD (chronic obstructive pulmonary disease) (HCC)   . Hypothyroidism   . Myocardial infarction (HCC)   . Pacemaker/defibrillator   . Stroke [facility])     Past Surgical History:  Procedure Laterality Date  . ABDOMINAL HYSTERECTOMY    . CHOLECYSTECTOMY    . INSERT / REPLACE / REMOVE PACEMAKER    . IR CT HEAD LTD  10/07/2020  . IR PERCUTANEOUS ART THROMBECTOMY/INFUSION INTRACRANIAL INC DIAG ANGIO  10/07/2020  . RADIOLOGY WITH ANESTHESIA N/A 10/07/2020   Procedure: IR WITH ANESTHESIA - CODE STROKE;  Surgeon: Radiologist, Medication, MD;  Location: [facility] OR;  Service: Radiology;  Laterality: N/A;  . THYROIDECTOMY    . TONSILLECTOMY      There were no vitals filed for this visit.   Subjective Assessment - 12/04/20 1439    Subjective  Pt reports she got up one morning and got ready for work, went to work and had difficulty with holding onto objects, difficulty speaking.  EMS was called and she was transported to the hospital and admitted for a CVA.  Was at [facility] Oct 07, 2020 and was discharged 10-24-2020.    Pertinent History CVA on 10-07-2020 and discharged on 10-24-2020. 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    Currently in Pain? No/denies    Pain Score 0-No pain    Pain Onset 1 to 4 weeks ago             [facility] OT Assessment - 12/04/20 1817      Assessment   Medical Diagnosis CVA    Onset Date/Surgical Date 10/07/20    Hand Dominance Right      Precautions   Precautions Fall      Balance Screen   Has the patient fallen in the past 6 months Yes    How many times? 2      Home  Environment   Family/patient expects to be discharged to: Private residence    Living Arrangements Children    Available Help at Discharge Family    Type of Home House    Home Access Stairs    Home Layout One level    Alternate Level Stairs - Number of Steps 7 to enter    [employer];Door    [employer] - manual;Bedside commode;Walker - 2 wheels;Grab bars - tub/shower;Shower seat    Lives  With Family;Son   sister, grandkids     Prior Function   Level of Independence Independent    Vocation Full time employment    Vocation Requirements food service    Leisure spending time with daughter, beach      ADL   Eating/Feeding Needs assist with cutting food    Grooming Modified independent    Upper Body Bathing Modified independent    Lower Body Bathing Increased time    Upper Body Dressing Increased time    Lower Body Dressing Increased time   elastic shoe laces   Toilet Transfer Modified independent    Toileting - Clothing Manipulation Increased time    Toileting -  Hygiene Increase time    Tub/Shower Transfer Modified independent    ADL comments Pt reports she is able to help with laundry, son made a shelf to put on her walker, she can put items in washer, can fold.  Can do some dishes but fatigues with  standing.      IADL   Prior Level of Function Shopping independent    Shopping Needs to be accompanied on any shopping trip    Prior Level of Function Light Housekeeping independent    Light Housekeeping Needs help with all home maintenance tasks    Prior Level of Function Meal Prep independent    Meal Prep Needs to have meals prepared and served    Prior Level of Function Paramedic on family or friends for transportation    Prior Level of Function Medication Managment independent    Medication Management Is not capable of dispensing or managing own medication    Prior Level of Function Financial Management independent    Financial Management Requires supervision/minimal cuing      Mobility   Mobility Status Needs assist    Mobility Status Comments ambulates with walker at home for short distances, in community uses the wheelchair      Vision - History   Baseline Vision Wears glasses only for reading    Additional Comments difficulty focusing on items      Cognition   Overall Cognitive Status Impaired/Different from baseline    Memory Impaired    Memory Impairment Decreased short term memory    Problem Solving Impaired    Executive Function Decision Making;Reasoning      Observation/Other Assessments   Focus on Therapeutic Outcomes (FOTO)  44      Sensation   Light Touch Impaired by gross assessment    Stereognosis Impaired by gross assessment    Hot/Cold Impaired by gross assessment    Additional Comments Pt reports numbness, tingling.      Coordination   Gross Motor Movements are Fluid and Coordinated No    Fine Motor Movements are Fluid and Coordinated No    9 Hole Peg Test Right;Left    Right 9 Hole Peg Test 29    Left 9 Hole Peg Test 50      AROM   Overall AROM Comments Decreased ROM and proprioception      Strength   Overall Strength Comments R UE 4/5, L 2/5      Hand Function   Right Hand Grip (lbs) 25     Right Hand Lateral Pinch 9 lbs    Right Hand 3 Point Pinch 7 lbs    Left Hand Grip (lbs) 1    Left Hand Lateral Pinch 1 lbs    Left 3 point pinch 1  lbs                                OT Long Term Goals - 12/04/20 1446      OT LONG TERM GOAL #1   Title Patient will be modified independent with home exercise program    Baseline Eval: No current program    Time 12    Period Weeks    Status New    Target Date 02/13/21      OT LONG TERM GOAL #2   Title Patient will improve grip strength of left hand by 10# to be able to stabilize and cut food with modified independence.    Baseline Eval: unable    Time 12    Period Weeks    Status New    Target Date 02/13/21      OT LONG TERM GOAL #3   Title Patient will demonstrate washing dishes in standing for up to 10 mins without rest breaks with modified independence.    Baseline Eval difficulty with task.    Time 8    Period Weeks    Status New    Target Date 01/16/21      OT LONG TERM GOAL #4   Title Patient will increase strength in left shoulder by 1 mm grade to place clothing in closet with modified independence.    Baseline Eval:  strength 2/5    Time 12    Period Weeks    Status New    Target Date 02/13/21      OT LONG TERM GOAL #5   Title Pt will improve functional hand strength to open jars and containers with occasional min assist.    Baseline Eval: unable to open jars and containers    Time 12    Period Weeks    Status New    Target Date 02/13/21      Long Term Additional Goals   Additional Long Term Goals --      OT LONG TERM GOAL #6   Title Pt will complete light meal prep with modified independence    Baseline Eval: unable    Time 12    Period Weeks    Status New    Target Date 02/13/21      OT LONG TERM GOAL #7   Title Patient will decrease 9 hole peg test  by 15 secs to manipulate small objects with modified independence and with good speed.    Baseline Eval: 50secs    Time 8     Period Weeks    Status New    Target Date 02/13/21      OT LONG TERM GOAL #8   Title Pt will improve FOTO score to 57 or above to show a clinically signifcant change function to complete daily tasks with greater independence.    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  Patient referred for outpatient OT evaluation.  Patient presents with muscle weakness, decreased coordination, decreased transfers and functional mobility, decreased ability to perform self-care and IADL  tasks.  Patient with limited range of motion and hand function in left upper extremity.  Patient also demonstrates impairments in cognition, memory and has difficulty with overstimulation in busy environments.  Patient would benefit from skilled occupational therapy intervention to maximize safety and independence and necessary daily tasks.    OT Occupational Profile and History Detailed Assessment- Review of Records and additional review of physical, cognitive, psychosocial history related to current functional performance    Occupational performance deficits (Please refer to evaluation for details): ADL's;IADL's;Work;Leisure    Body Structure / Function / Physical Skills ADL;Dexterity;Flexibility;ROM;Strength;Balance;Coordination;FMC;IADL;Endurance;Gait;Pain;UE functional use;Decreased knowledge of use of DME;GMC;Mobility    Cognitive Skills Attention;Memory;Safety Awareness    Psychosocial Skills Environmental  Adaptations;Habits;Routines and Behaviors    Rehab Potential Good    Clinical Decision Making Several treatment options, min-mod task modification necessary    Comorbidities Affecting Occupational Performance: May have comorbidities impacting occupational  performance    Modification or Assistance to Complete Evaluation  No modification of tasks or assist necessary to complete eval    OT Frequency 2x / week    OT Duration 12 weeks    OT Treatment/Interventions Cryotherapy;Self-care/ADL training;Therapeutic exercise;DME and/or AE instruction;Functional Mobility Training;Cognitive remediation/compensation;Neuromuscular education;Manual Therapy;Moist Heat;Contrast Bath;Energy conservation;Therapeutic activities;Passive range of motion;Patient/family education    Consulted and Agree with Plan of Care Patient;Family member/caregiver    Family Member Consulted daughter           Patient will benefit from skilled therapeutic intervention in order to improve the following deficits and impairments:   Body Structure / Function / Physical Skills: ADL,Dexterity,Flexibility,ROM,Strength,Balance,Coordination,FMC,IADL,Endurance,Gait,Pain,UE functional use,Decreased knowledge of use of DME,GMC,Mobility Cognitive Skills: Attention,Memory,Safety Awareness Psychosocial Skills: Environmental  Adaptations,Habits,Routines and Behaviors   Visit Diagnosis: Muscle weakness (generalized)  Right middle cerebral artery stroke (HCC)  Unsteadiness on feet  Other lack of coordination    Problem List Patient Active Problem List   Diagnosis Date Noted  . Loose stools   . Visual disturbance   . AKI (acute kidney injury) (HCC)   . Hemiparesis affecting left side as late effect of stroke (HCC)   . Leukocytosis   . Urge incontinence   . Chronic systolic congestive heart failure (HCC)   . Vascular headache   . Anxiety state   . 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 Acute myopericarditis   . SAH (subarachnoid hemorrhage) (HCC)   . Tobacco abuse   . Chronic pain syndrome   . Prediabetes   . Nonischemic cardiomyopathy (HCC)   . AICD (automatic cardioverter/defibrillator) present   . Acute ischemic right MCA stroke (HCC) 10/07/2020  . Middle  cerebral artery embolism, right 10/07/2020  . Chest pain 11/16/2017   Melchizedek Espinola T Arne [place], OTR/L, CLT  Naszir Cott 12/04/2020, 6:23 PM  [place] [facility] MAIN [facility] SERVICES [address] [place], [place], [address] Phone: [phone]   Fax:  [phone]  Name: GINGER LEETH MRN: [id number] Date of Birth: 12-03-1955

## 2020-12-08 ENCOUNTER — Encounter: Payer: Self-pay | Admitting: Occupational Therapy

## 2020-12-08 ENCOUNTER — Other Ambulatory Visit: Payer: Self-pay

## 2020-12-08 ENCOUNTER — Ambulatory Visit: Payer: Medicare HMO | Admitting: Occupational Therapy

## 2020-12-08 ENCOUNTER — Ambulatory Visit: Payer: Medicare HMO

## 2020-12-08 DIAGNOSIS — R278 Other lack of coordination: Secondary | ICD-10-CM

## 2020-12-08 DIAGNOSIS — R269 Unspecified abnormalities of gait and mobility: Secondary | ICD-10-CM

## 2020-12-08 DIAGNOSIS — I63511 Cerebral infarction due to unspecified occlusion or stenosis of right middle cerebral artery: Secondary | ICD-10-CM | POA: Diagnosis not present

## 2020-12-08 DIAGNOSIS — R2681 Unsteadiness on feet: Secondary | ICD-10-CM

## 2020-12-08 DIAGNOSIS — M6281 Muscle weakness (generalized): Secondary | ICD-10-CM

## 2020-12-08 NOTE — Therapy (Signed)
[place] [facility] MAIN [facility] SERVICES [address] [place], [place], [address] Phone: [phone]   Fax:  [phone]  Occupational Therapy Treatment  Patient Details  Name: Rebecca Singh MRN: [id number] Date of Birth: June 13, 1956 No data recorded  Encounter Date: 12/08/2020   OT End of Session - 12/08/20 0950    Visit Number 3    Number of Visits 24    Date for OT Re-Evaluation 02/13/21    OT Start Time 0931    OT Stop Time 1015    OT Time Calculation (min) 44 min    Activity Tolerance Patient tolerated treatment well    Behavior During Therapy [facility] for tasks assessed/performed           Past Medical History:  Diagnosis Date  . AICD (automatic cardioverter/defibrillator) present   . COPD (chronic obstructive pulmonary disease) (HCC)   . Hypothyroidism   . Myocardial infarction (HCC)   . Pacemaker/defibrillator   . Stroke [facility])     Past Surgical History:  Procedure Laterality Date  . ABDOMINAL HYSTERECTOMY    . CHOLECYSTECTOMY    . INSERT / REPLACE / REMOVE PACEMAKER    . IR CT HEAD LTD  10/07/2020  . IR PERCUTANEOUS ART THROMBECTOMY/INFUSION INTRACRANIAL INC DIAG ANGIO  10/07/2020  . RADIOLOGY WITH ANESTHESIA N/A 10/07/2020   Procedure: IR WITH ANESTHESIA - CODE STROKE;  Surgeon: Radiologist, Medication, MD;  Location: [facility] OR;  Service: Radiology;  Laterality: N/A;  . THYROIDECTOMY    . TONSILLECTOMY      There were no vitals filed for this visit.   Subjective Assessment - 12/08/20 0948    Subjective  Pt reports she has a headache everyday, 5/10.  Yesterday she didn't get out of bed at all due to the headache being worse.    Pertinent History CVA on 10-07-2020 and discharged on 10-24-2020. 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    Currently in Pain? Yes    Pain Score 5     Pain Location Head    Pain Descriptors / Indicators Aching    Pain Type Chronic pain    Pain Onset 1 to 4 weeks ago    Pain Frequency Intermittent           Therapeutic Exercise: Pt seen for strengthening with use of UBE from seated wheelchair position for 8 mins total, forwards/backwards, alternating levels of resistance from 0 to 1.0.  Therapist in constant attendance to ensure grip and to adjust settings.  Short rest breaks as needed.   SAEBO looped ball tower with use of left hand, no weight for the first round, performed reaching for all 4 levels.  Added 1# weight to second round, patient able to complete.  Able to perform all 4 levels with increased effort.    Neuromuscular reeducation: Pt seen for coordination skills with use of playing cards, Card flipping with emphasis on thumb and finger combinations (thumb on top flipping away, fingers on top flipping forwards)  Patient slow to complete task and movements are more deliberate.  PVC pipe with own design   Response to tx: Pt continues to progress with strengthening and coordination tasks.  Short rest breaks as needed during therapy session.  Reaching tasks with no weight then able to tolerate adding 1#  with increased effort to complete second round of task.  Coordination slow and deliberate with movements and will require additional work in this area to improve speed and dexterity of tasks.  Continue OT to maximize safety and independence in necessary daily tasks.                OT Education - 12/08/20 0950    Education Details ROM, coordination    Person(s) Educated Patient    Methods Explanation;Demonstration    Comprehension Verbalized understanding;Returned demonstration               OT Long Term Goals - 12/04/20 1446      OT LONG TERM GOAL #1   Title Patient will be modified independent with home exercise program    Baseline Eval: No  current program    Time 12    Period Weeks    Status New    Target Date 02/13/21      OT LONG TERM GOAL #2   Title Patient will improve grip strength of left hand by 10# to be able to stabilize and cut food with modified independence.    Baseline Eval: unable    Time 12    Period Weeks    Status New    Target Date 02/13/21      OT LONG TERM GOAL #3   Title Patient will demonstrate washing dishes in standing for up to 10 mins without rest breaks with modified independence.    Baseline Eval difficulty with task.    Time 8    Period Weeks    Status New    Target Date 01/16/21      OT LONG TERM GOAL #4   Title Patient will increase strength in left shoulder by 1 mm grade to place clothing in closet with modified independence.    Baseline Eval:  strength 2/5    Time 12    Period Weeks    Status New    Target Date 02/13/21      OT LONG TERM GOAL #5   Title Pt will improve functional hand strength to open jars and containers with occasional min assist.    Baseline Eval: unable to open jars and containers    Time 12    Period Weeks    Status New    Target Date 02/13/21      Long Term Additional Goals   Additional Long Term Goals --      OT LONG TERM GOAL #6   Title Pt will complete light meal prep with modified independence    Baseline Eval: unable    Time 12    Period Weeks    Status New    Target Date 02/13/21      OT LONG TERM GOAL #7   Title Patient will decrease 9 hole peg test  by 15 secs to manipulate small objects with modified independence and with good speed.    Baseline Eval: 50secs    Time 8    Period Weeks    Status New    Target Date 02/13/21      OT LONG TERM GOAL #8   Title Pt will improve FOTO score to 57 or above to show a clinically signifcant change function to complete daily tasks with greater independence.    Baseline Eval: 44    Time 12    Period Weeks    Status New    Target Date 02/13/21  Plan - 12/08/20 0951     Clinical Impression Statement Pt continues to progress with strengthening and coordination tasks.  Short rest breaks as needed during therapy session.  Reaching tasks with no weight then able to tolerate adding 1# with increased effort to complete second round of task.  Coordination slow and deliberate with movements and will require additional work in this area to improve speed and dexterity of tasks.  Continue OT to maximize safety and independence in necessary daily tasks.    OT Occupational Profile and History Detailed Assessment- Review of Records and additional review of physical, cognitive, psychosocial history related to current functional performance    Occupational performance deficits (Please refer to evaluation for details): ADL's;IADL's;Work;Leisure    Body Structure / Function / Physical Skills ADL;Dexterity;Flexibility;ROM;Strength;Balance;Coordination;FMC;IADL;Endurance;Gait;Pain;UE functional use;Decreased knowledge of use of DME;GMC;Mobility    Cognitive Skills Attention;Memory;Safety Awareness    Psychosocial Skills Environmental  Adaptations;Habits;Routines and Behaviors    Rehab Potential Good    Clinical Decision Making Several treatment options, min-mod task modification necessary    Comorbidities Affecting Occupational Performance: May have comorbidities impacting occupational performance    Modification or Assistance to Complete Evaluation  No modification of tasks or assist necessary to complete eval    OT Frequency 2x / week    OT Duration 12 weeks    OT Treatment/Interventions Cryotherapy;Self-care/ADL training;Therapeutic exercise;DME and/or AE instruction;Functional Mobility Training;Cognitive remediation/compensation;Neuromuscular education;Manual Therapy;Moist Heat;Contrast Bath;Energy conservation;Therapeutic activities;Passive range of motion;Patient/family education    Consulted and Agree with Plan of Care Patient;Family member/caregiver    Family Member Consulted  daughter           Patient will benefit from skilled therapeutic intervention in order to improve the following deficits and impairments:   Body Structure / Function / Physical Skills: ADL,Dexterity,Flexibility,ROM,Strength,Balance,Coordination,FMC,IADL,Endurance,Gait,Pain,UE functional use,Decreased knowledge of use of DME,GMC,Mobility Cognitive Skills: Attention,Memory,Safety Awareness Psychosocial Skills: Environmental  Adaptations,Habits,Routines and Behaviors   Visit Diagnosis: Muscle weakness (generalized)  Other lack of coordination  Unsteadiness on feet    Problem List Patient Active Problem List   Diagnosis Date Noted  . Loose stools   . Visual disturbance   . AKI (acute kidney injury) (HCC)   . Hemiparesis affecting left side as late effect of stroke (HCC)   . Leukocytosis   . Urge incontinence   . Chronic systolic congestive heart failure (HCC)   . Vascular headache   . Anxiety state   . 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 Acute myopericarditis   . SAH (subarachnoid hemorrhage) (HCC)   . Tobacco abuse   . Chronic pain syndrome   . Prediabetes   . Nonischemic cardiomyopathy (HCC)   . AICD (automatic cardioverter/defibrillator) present   . Acute ischemic right MCA stroke (HCC) 10/07/2020  . Middle cerebral artery embolism, right 10/07/2020  . Chest pain 11/16/2017   Kerrie Buffalo, OTR/L, CLT  Rhilyn Battle 12/09/2020, 4:34 PM  [place] [facility] MAIN [facility] SERVICES [address] [place], [place], [address] Phone: [phone]   Fax:  [phone]  Name: DEVONE BONILLA MRN: [id number] Date of Birth: 08-Apr-1956

## 2020-12-08 NOTE — Therapy (Signed)
[place] [facility] MAIN [facility] SERVICES [address] [place], [place], [address] Phone: [phone]   Fax:  [phone]  Physical Therapy Treatment  Patient Details  Name: Rebecca Singh MRN: [id number] Date of Birth: [birth year] Referring Provider (PT): Mariam Dollar   Encounter Date: 12/08/2020   PT End of Session - 12/08/20 2010    Visit Number 5    Number of Visits 25    Date for PT Re-Evaluation 02/04/21    PT Start Time 1015    PT Stop Time 1059    PT Time Calculation (min) 44 min    Equipment Utilized During Treatment Gait belt    Activity Tolerance Patient tolerated treatment well;Other (comment)   some tests not performed due to patient needing to be in treatment room for evaluation due to overstimulation out in clinic   Behavior During Therapy [facility] for tasks assessed/performed   Patient with some agitation during evaluation          Past Medical History:  Diagnosis Date  . AICD (automatic cardioverter/defibrillator) present   . COPD (chronic obstructive pulmonary disease) (HCC)   . Hypothyroidism   . Myocardial infarction (HCC)   . Pacemaker/defibrillator   . Stroke [facility])     Past Surgical History:  Procedure Laterality Date  . ABDOMINAL HYSTERECTOMY    . CHOLECYSTECTOMY    . INSERT / REPLACE / REMOVE PACEMAKER    . IR CT HEAD LTD  10/07/2020  . IR PERCUTANEOUS ART THROMBECTOMY/INFUSION INTRACRANIAL INC DIAG ANGIO  10/07/2020  . RADIOLOGY WITH ANESTHESIA N/A 10/07/2020   Procedure: IR WITH ANESTHESIA - CODE STROKE;  Surgeon: Radiologist, Medication, MD;  Location: [facility] OR;  Service: Radiology;  Laterality: N/A;  . THYROIDECTOMY    . TONSILLECTOMY      There were no vitals filed for this visit.   Subjective Assessment - 12/08/20 1019    Subjective Patient reports having a good weekend with no new complaints. Reports walking some in home without her walker and states she has been compliant with HEP.    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 unrestricted    How long can you stand comfortably? 2-3 mins    How long can you walk comfortably? 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 No/denies    Pain Onset 1 to 4 weeks ago         Interventions:  Therapeutic exercises: Reviewed Standing LE strengthening exercises from previous visit:  Stand hip march Stand hip ext Stand hip abd Stand knee flex Stand Minisquats Stand calf raises Stand toe raises  Patient performed 10-12 reps of each exercises with VC to slow and perform with more control. Education provided throughout session via VC/TC and demonstration to facilitate movement at target joints and correct muscle activation for all testing and exercises performed.  Issued Handout for above therex- Patient able to review and verbalize understanding.      Access Code: [email or web address] URL: [email or web address] Date: 12/08/2020 Prepared by: Maureen Ralphs  Exercises Standing March with Counter Support - 1 x daily - 5 x weekly - 3 sets - 10 reps - 2 hold Standing Hip Extension  with Counter Support - 1 x daily - 5 x weekly - 3 sets - 10 reps - 2 hold Standing Hip Abduction with Counter Support - 1 x daily - 5 x weekly - 3 sets - 10 reps - 2 hold Standing Knee Flexion with Counter Support - 1 x daily - 5 x weekly - 3 sets - 10 reps - 2 hold Mini Squat with Counter Support - 1 x daily - 5 x weekly - 3 sets - 10 reps - 2 hold    Gait training: Patient ambulated approx 120 feet today without an assistive device with SBA and exhibiting short reciprocal steps with decreased left heel strike and VC to  increase step length. Patient was able to minimally improve her sequencing and quality with VC and exhibited no loss of balance.   Clinical Impression: Patient responded well to review of LE strengthening and successfully added to home program today. Issued handout and patient able to demo correct technique after cueing. The patient will continue to benefit from additional skilled PT services to improved strength and balance for improved gait abilities and improve quality of life                         PT Education - 12/08/20 2009    Education Details Specific exercise cues    Person(s) Educated Patient    Methods Explanation;Demonstration;Tactile cues;Verbal cues;Handout    Comprehension Verbalized understanding;Tactile cues required;Returned demonstration;Need further instruction;Verbal cues required            PT Short Term Goals - 11/12/20 1002      PT SHORT TERM GOAL #1   Title Patient will be independent in home exercise program to improve strength/mobility for better functional independence with ADLs.    Status New    Target Date 02/04/21             PT Long Term Goals - 11/24/20 2230      PT LONG TERM GOAL #1   Title Patient will increase FOTO score to equal to or greater than 62% to demonstrate statistically significant improvement in mobility and quality of life.    Baseline 11/12/20 62%    Time 12    Period Weeks    Status New    Target Date 02/04/21      PT LONG TERM GOAL #2   Title Patient (> 41 years old) will complete five times sit to stand test in < 15 seconds indicating an increased LE strength and improved balance.    Baseline 11/12/20 28.82    Time 12    Period Weeks    Status New    Target Date 02/04/21      PT LONG TERM GOAL #3   Title Patient will increase 10 meter walk test to >1.7m/s as to improve gait speed for better community ambulation and to reduce fall risk.    Baseline 11/12/20  not assessed. 11/24/2020= 0.7 m/s using front  wheeled walker    Time 12    Period Weeks    Status New      PT LONG TERM GOAL #4   Title Patient will increase Berg Balance score to at least 45/56  points to demonstrate decreased fall risk during functional activities.    Baseline 11/12/20  not assessed. 11/24/2020- Patient declined to assess    Time 12    Period Weeks    Status New    Target Date 02/04/21  Plan - 12/08/20 2010    Clinical Impression Statement Patient responded well to review of LE strengthening and successfully added to home program today. Issued handout and patient able to demo correct technique after cueing. The patient will continue to benefit from additional skilled PT services to improved strength and balance for improved gait abilities and improve quality of life    Personal Factors and Comorbidities Comorbidity 3+    Comorbidities Left TKA 10/21, pacemaker, COPD, HTN, prediabetic    Examination-Activity Limitations Bathing;Dressing;Transfers;Lift;Squat;Locomotion Level;Stairs;Carry;Stand    Examination-Participation Restrictions Cleaning;Community Activity;Driving;Meal Prep;Occupation;Shop    Stability/Clinical Decision Making Evolving/Moderate complexity    Rehab Potential Good    PT Frequency 2x / week    PT Duration 12 weeks    PT Treatment/Interventions ADLs/Self Care Home Management;Gait training;Stair training;Functional mobility training;Therapeutic activities;Therapeutic exercise;Balance training;Neuromuscular re-education;Patient/family education;Visual/perceptual remediation/compensation    PT Next Visit Plan Review standing LE strengthening and continue with gait training as appropriate.    PT Home Exercise Plan Access Code: [email or web address] (standing LE strengthening exercises)    Consulted and Agree with Plan of Care Patient;Family member/caregiver    Family Member Consulted niece           Patient will benefit from skilled therapeutic intervention in order to improve the  following deficits and impairments:  Abnormal gait,Decreased balance,Decreased endurance,Decreased mobility,Difficulty walking,Decreased range of motion,Decreased activity tolerance,Decreased strength,Impaired flexibility,Impaired UE functional use  Visit Diagnosis: Muscle weakness (generalized)  Other lack of coordination  Unsteadiness on feet  Abnormality of gait and mobility     Problem List Patient Active Problem List   Diagnosis Date Noted  . Loose stools   . Visual disturbance   . AKI (acute kidney injury) (HCC)   . Hemiparesis affecting left side as late effect of stroke (HCC)   . Leukocytosis   . Urge incontinence   . Chronic systolic congestive heart failure (HCC)   . Vascular headache   . Anxiety state   . 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 Acute myopericarditis   . SAH (subarachnoid hemorrhage) (HCC)   . Tobacco abuse   . Chronic pain syndrome   . Prediabetes   . Nonischemic cardiomyopathy (HCC)   . AICD (automatic cardioverter/defibrillator) present   . Acute ischemic right MCA stroke (HCC) 10/07/2020  . Middle cerebral artery embolism, right 10/07/2020  . Chest pain 11/16/2017    Lenda Kelp, PT 12/08/2020, 8:19 PM  [place] [facility] MAIN [facility] SERVICES [address] [place], [place], [address] Phone: [phone]   Fax:  [phone]  Name: Rebecca Singh MRN: [id number] Date of Birth: November 08, 1955

## 2020-12-10 ENCOUNTER — Encounter: Payer: Medicare HMO | Admitting: Occupational Therapy

## 2020-12-10 ENCOUNTER — Institutional Professional Consult (permissible substitution): Payer: Medicare HMO | Admitting: Cardiology

## 2020-12-11 ENCOUNTER — Ambulatory Visit: Payer: Medicare HMO | Admitting: Physical Medicine & Rehabilitation

## 2020-12-15 ENCOUNTER — Ambulatory Visit: Payer: Medicare HMO | Admitting: Physical Therapy

## 2020-12-17 ENCOUNTER — Other Ambulatory Visit: Payer: Self-pay

## 2020-12-17 ENCOUNTER — Encounter: Payer: Self-pay | Admitting: Occupational Therapy

## 2020-12-17 ENCOUNTER — Ambulatory Visit: Payer: Medicare HMO | Attending: Physician Assistant

## 2020-12-17 ENCOUNTER — Ambulatory Visit: Payer: Medicare HMO | Admitting: Occupational Therapy

## 2020-12-17 DIAGNOSIS — M6281 Muscle weakness (generalized): Secondary | ICD-10-CM

## 2020-12-17 DIAGNOSIS — R278 Other lack of coordination: Secondary | ICD-10-CM

## 2020-12-17 DIAGNOSIS — R269 Unspecified abnormalities of gait and mobility: Secondary | ICD-10-CM | POA: Diagnosis present

## 2020-12-17 DIAGNOSIS — R2681 Unsteadiness on feet: Secondary | ICD-10-CM | POA: Diagnosis present

## 2020-12-17 DIAGNOSIS — R262 Difficulty in walking, not elsewhere classified: Secondary | ICD-10-CM | POA: Insufficient documentation

## 2020-12-17 NOTE — Therapy (Signed)
[place] [facility] [place] MAIN [facility] SERVICES [address] [place], [place], [address] Phone: [phone]   Fax:  [phone]  Occupational Therapy Treatment  Patient Details  Name: Rebecca Singh MRN: [id number] Date of Birth: 09/29/1955 No data recorded  Encounter Date: 12/17/2020   OT End of Session - 12/17/20 1710    Visit Number 4    Number of Visits 24    Date for OT Re-Evaluation 02/13/21    OT Start Time 1148    OT Stop Time 1230    OT Time Calculation (min) 42 min    Activity Tolerance Patient tolerated treatment well    Behavior During Therapy [facility] for tasks assessed/performed           Past Medical History:  Diagnosis Date  . AICD (automatic cardioverter/defibrillator) present   . COPD (chronic obstructive pulmonary disease) (HCC)   . Hypothyroidism   . Myocardial infarction (HCC)   . Pacemaker/defibrillator   . Stroke [facility])     Past Surgical History:  Procedure Laterality Date  . ABDOMINAL HYSTERECTOMY    . CHOLECYSTECTOMY    . INSERT / REPLACE / REMOVE PACEMAKER    . IR CT HEAD LTD  10/07/2020  . IR PERCUTANEOUS ART THROMBECTOMY/INFUSION INTRACRANIAL INC DIAG ANGIO  10/07/2020  . RADIOLOGY WITH ANESTHESIA N/A 10/07/2020   Procedure: IR WITH ANESTHESIA - CODE STROKE;  Surgeon: Radiologist, Medication, MD;  Location: [facility] OR;  Service: Radiology;  Laterality: N/A;  . THYROIDECTOMY    . TONSILLECTOMY      There were no vitals filed for this visit.   Subjective Assessment - 12/17/20 1709    Subjective  Pt. reports having a 5/10 headache    Pertinent History CVA on 10-07-2020 and discharged on 10-24-2020. 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 Yes    Pain Score 5     Pain Location Head    Pain Descriptors / Indicators Aching          OT TREATMENT    Neuro muscular  re-education:  Pt. Worked gross digit extension with emphasis placed on active controlled releasing of objects 3, and 4" pegs. Pt. Worked on 3pt grasp of smaller pegs removing them from a pegboard while actively releasing them into a container. Pt. Worked on grasping 3/4" washers, and storing them in the palm of her hand.  Therapeutic Exercise:  Pt. Worked on the BUE UBE in the forward motion while seated for 4 min. Pt. Performed AROM digit extension at the tabletop surface, place and hold with resistance.   Pt. Presents with increased tone, and tightness in the left hand today requiring the pt. To alternate weightbearing, and proprioception through the hand between task reps.  Pt. Continues to work on improving left hand function in order to work towards improving engagement during ADL tasks, and maximizing independence with ADLs, and IADLs.                       OT Education - 12/17/20 1710    Education Details ROM, coordination    Person(s) Educated Patient    Comprehension Verbalized understanding;Returned demonstration               OT Long Term Goals - 12/04/20 1446      OT LONG TERM GOAL #1   Title Patient will be modified independent  with home exercise program    Baseline Eval: No current program    Time 12    Period Weeks    Status New    Target Date 02/13/21      OT LONG TERM GOAL #2   Title Patient will improve grip strength of left hand by 10# to be able to stabilize and cut food with modified independence.    Baseline Eval: unable    Time 12    Period Weeks    Status New    Target Date 02/13/21      OT LONG TERM GOAL #3   Title Patient will demonstrate washing dishes in standing for up to 10 mins without rest breaks with modified independence.    Baseline Eval difficulty with task.    Time 8    Period Weeks    Status New    Target Date 01/16/21      OT LONG TERM GOAL #4   Title Patient will increase strength in left shoulder by 1 mm  grade to place clothing in closet with modified independence.    Baseline Eval:  strength 2/5    Time 12    Period Weeks    Status New    Target Date 02/13/21      OT LONG TERM GOAL #5   Title Pt will improve functional hand strength to open jars and containers with occasional min assist.    Baseline Eval: unable to open jars and containers    Time 12    Period Weeks    Status New    Target Date 02/13/21      Long Term Additional Goals   Additional Long Term Goals --      OT LONG TERM GOAL #6   Title Pt will complete light meal prep with modified independence    Baseline Eval: unable    Time 12    Period Weeks    Status New    Target Date 02/13/21      OT LONG TERM GOAL #7   Title Patient will decrease 9 hole peg test  by 15 secs to manipulate small objects with modified independence and with good speed.    Baseline Eval: 50secs    Time 8    Period Weeks    Status New    Target Date 02/13/21      OT LONG TERM GOAL #8   Title Pt will improve FOTO score to 57 or above to show a clinically signifcant change function to complete daily tasks with greater independence.    Baseline Eval: 44    Time 12    Period Weeks    Status New    Target Date 02/13/21                 Plan - 12/17/20 1716    Clinical Impression Statement Pt. Presents with increased tone, and tightness in the left hand today requiring the pt. To alternate weightbearing, and proprioception through the hand between task reps.  Pt. Continues to work on improving left hand function in order to work towards improving engagement during ADL tasks, and maximizing independence with ADLs, and IADLs.   OT Occupational Profile and History Detailed Assessment- Review of Records and additional review of physical, cognitive, psychosocial history related to current functional performance    Occupational performance deficits (Please refer to evaluation for details): ADL's;IADL's;Work;Leisure    Body Structure /  Function / Physical Skills ADL;Dexterity;Flexibility;ROM;Strength;Balance;Coordination;FMC;IADL;Endurance;Gait;Pain;UE functional use;Decreased knowledge of use  of DME;GMC;Mobility    Cognitive Skills Attention;Memory;Safety Awareness    Psychosocial Skills Environmental  Adaptations;Habits;Routines and Behaviors    Rehab Potential Good    Clinical Decision Making Several treatment options, min-mod task modification necessary    Comorbidities Affecting Occupational Performance: May have comorbidities impacting occupational performance    Modification or Assistance to Complete Evaluation  No modification of tasks or assist necessary to complete eval    OT Frequency 2x / week    OT Duration 12 weeks    OT Treatment/Interventions Cryotherapy;Self-care/ADL training;Therapeutic exercise;DME and/or AE instruction;Functional Mobility Training;Cognitive remediation/compensation;Neuromuscular education;Manual Therapy;Moist Heat;Contrast Bath;Energy conservation;Therapeutic activities;Passive range of motion;Patient/family education    Consulted and Agree with Plan of Care Patient;Family member/caregiver    Family Member Consulted Niece           Patient will benefit from skilled therapeutic intervention in order to improve the following deficits and impairments:   Body Structure / Function / Physical Skills: ADL,Dexterity,Flexibility,ROM,Strength,Balance,Coordination,FMC,IADL,Endurance,Gait,Pain,UE functional use,Decreased knowledge of use of DME,GMC,Mobility Cognitive Skills: Attention,Memory,Safety Awareness Psychosocial Skills: Environmental  Adaptations,Habits,Routines and Behaviors   Visit Diagnosis: Muscle weakness (generalized)  Other lack of coordination    Problem List Patient Active Problem List   Diagnosis Date Noted  . Loose stools   . Visual disturbance   . AKI (acute kidney injury) (HCC)   . Hemiparesis affecting left side as late effect of stroke (HCC)   . Leukocytosis   .  Urge incontinence   . Chronic systolic congestive heart failure (HCC)   . Vascular headache   . Anxiety state   . 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 Acute myopericarditis   . SAH (subarachnoid hemorrhage) (HCC)   . Tobacco abuse   . Chronic pain syndrome   . Prediabetes   . Nonischemic cardiomyopathy (HCC)   . AICD (automatic cardioverter/defibrillator) present   . Acute ischemic right MCA stroke (HCC) 10/07/2020  . Middle cerebral artery embolism, right 10/07/2020  . Chest pain 11/16/2017    Olegario Messier, MS, OTR/L 12/17/2020, 5:19 PM  [place] [facility] MAIN [facility] SERVICES [address] [place], [place], [address] Phone: [phone]   Fax:  [phone]  Name: Rebecca Singh MRN: [id number] Date of Birth: May 15, 1956

## 2020-12-17 NOTE — Therapy (Signed)
[facility] MAIN [facility] SERVICES [address] [place], [place], [address] Phone: [phone]   Fax:  [phone]  Physical Therapy Treatment  Patient Details  Name: Rebecca Singh MRN: [id number] Date of Birth: 07-Apr-1956 Referring Provider (PT): Mariam Dollar   Encounter Date: 12/17/2020   PT End of Session - 12/17/20 1249    Visit Number 6    Number of Visits 25    Date for PT Re-Evaluation 02/04/21    PT Start Time 1100    PT Stop Time 1144    PT Time Calculation (min) 44 min    Equipment Utilized During Treatment Gait belt    Activity Tolerance Patient tolerated treatment well;Other (comment)   some tests not performed due to patient needing to be in treatment room for evaluation due to overstimulation out in clinic   Behavior During Therapy [facility] for tasks assessed/performed   Patient with some agitation during evaluation          Past Medical History:  Diagnosis Date  . AICD (automatic cardioverter/defibrillator) present   . COPD (chronic obstructive pulmonary disease) (HCC)   . Hypothyroidism   . Myocardial infarction (HCC)   . Pacemaker/defibrillator   . Stroke [facility])     Past Surgical History:  Procedure Laterality Date  . ABDOMINAL HYSTERECTOMY    . CHOLECYSTECTOMY    . INSERT / REPLACE / REMOVE PACEMAKER    . IR CT HEAD LTD  10/07/2020  . IR PERCUTANEOUS ART THROMBECTOMY/INFUSION INTRACRANIAL INC DIAG ANGIO  10/07/2020  . RADIOLOGY WITH ANESTHESIA N/A 10/07/2020   Procedure: IR WITH ANESTHESIA - CODE STROKE;  Surgeon: Radiologist, Medication, MD;  Location: [facility] OR;  Service: Radiology;  Laterality: N/A;  . THYROIDECTOMY    . TONSILLECTOMY      There were no vitals filed for this visit.   Subjective Assessment - 12/17/20 1248    Subjective Pt denies any falls and is walking more in home with no RW. Wishing to one day return to work part time.    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 unrestricted    How long can you stand comfortably? 2-3 mins    How long can you walk comfortably? 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 No/denies    Pain Onset 1 to 4 weeks ago           There.ex:   Standing exercises with BUE support on balance bar and 2# AW's   Marches: 2x12    Hip abd: 2x12/LE. TC on hips for correct form/technique   Hip extension: 2x12, required emeo for form/technique.   Heel to toe raises: 2x12   Mini squats: x5. VC and Demo for increasing hip hinge. Poor carryover and reports of L knee pain so discontinued.      Neuro Re-Ed:   Side steps with 2 hurdles in // bars. x5 laps. VC for maintaining body in frontal plane with poor carryover. SUE support. SBA  Airex pad NBOS/EC: reports of dizziness and that "this is stupid" so pt requested to discontinue.   Airex beam: tandem walking forwards/backwards x5. Intermittent finger support. SBA  Airex beam:  side steps x5/direction. Intermittent finger support. SBA  Alternating cone taps with forwards walking: no UE support, x5 laps. Good SLS on stance limb and control with cone taps. Able to maintain conversation while performing with no sway or LOB.   PT Education - 12/17/20 1249    Education Details exercise form/technique    Person(s) Educated Patient    Methods Explanation;Demonstration;Tactile cues;Verbal cues    Comprehension Verbalized understanding;Returned demonstration            PT Short Term Goals - 11/12/20 1002      PT SHORT TERM GOAL #1   Title Patient will be independent in home exercise program to improve strength/mobility for better functional independence with ADLs.     Status New    Target Date 02/04/21             PT Long Term Goals - 11/24/20 2230      PT LONG TERM GOAL #1   Title Patient will increase FOTO score to equal to or greater than 62% to demonstrate statistically significant improvement in mobility and quality of life.    Baseline 11/12/20 62%    Time 12    Period Weeks    Status New    Target Date 02/04/21      PT LONG TERM GOAL #2   Title Patient (> 26 years old) will complete five times sit to stand test in < 15 seconds indicating an increased LE strength and improved balance.    Baseline 11/12/20 28.82    Time 12    Period Weeks    Status New    Target Date 02/04/21      PT LONG TERM GOAL #3   Title Patient will increase 10 meter walk test to >1.68m/s as to improve gait speed for better community ambulation and to reduce fall risk.    Baseline 11/12/20  not assessed. 11/24/2020= 0.7 m/s using front wheeled walker    Time 12    Period Weeks    Status New      PT LONG TERM GOAL #4   Title Patient will increase Berg Balance score to at least 45/56  points to demonstrate decreased fall risk during functional activities.    Baseline 11/12/20  not assessed. 11/24/2020- Patient declined to assess    Time 12    Period Weeks    Status New    Target Date 02/04/21                 Plan - 12/17/20 1250    Clinical Impression Statement Progressed pt's HEP to add resistance with good demo of form/technique and BUE support. Pt displays difficulty with balance on unstable surfaces with eyes closed with A/P sway and reports of dizziness. Besides LE fatigue and weakness, pt displays good balance with reduced BOS and on unstable surfaces and SLS activities. Pt required min verbal cuing for eliminating RUE support on support bar with good carryover. Pt can continue to benefit from skilled PT services to further progress LE strength and balance to return to PLOF.    Personal Factors and Comorbidities Comorbidity 3+    Comorbidities Left TKA  10/21, pacemaker, COPD, HTN, prediabetic    Examination-Activity Limitations Bathing;Dressing;Transfers;Lift;Squat;Locomotion Level;Stairs;Carry;Stand    Examination-Participation Restrictions Cleaning;Community Activity;Driving;Meal Prep;Occupation;Shop    Stability/Clinical Decision Making Evolving/Moderate complexity    Rehab Potential Good    PT Frequency 2x / week    PT Duration 12 weeks    PT Treatment/Interventions ADLs/Self Care Home Management;Gait  training;Stair training;Functional mobility training;Therapeutic activities;Therapeutic exercise;Balance training;Neuromuscular re-education;Patient/family education;Visual/perceptual remediation/compensation    PT Next Visit Plan Review standing LE strengthening and continue with gait training as appropriate.    PT Home Exercise Plan Access Code: [email or web address] (standing LE strengthening exercises)    Consulted and Agree with Plan of Care Patient;Family member/caregiver    Family Member Consulted niece           Patient will benefit from skilled therapeutic intervention in order to improve the following deficits and impairments:  Abnormal gait,Decreased balance,Decreased endurance,Decreased mobility,Difficulty walking,Decreased range of motion,Decreased activity tolerance,Decreased strength,Impaired flexibility,Impaired UE functional use  Visit Diagnosis: Muscle weakness (generalized)  Other lack of coordination  Unsteadiness on feet  Abnormality of gait and mobility     Problem List Patient Active Problem List   Diagnosis Date Noted  . Loose stools   . Visual disturbance   . AKI (acute kidney injury) (HCC)   . Hemiparesis affecting left side as late effect of stroke (HCC)   . Leukocytosis   . Urge incontinence   . Chronic systolic congestive heart failure (HCC)   . Vascular headache   . Anxiety state   . 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 Acute myopericarditis   . SAH (subarachnoid hemorrhage) (HCC)    . Tobacco abuse   . Chronic pain syndrome   . Prediabetes   . Nonischemic cardiomyopathy (HCC)   . AICD (automatic cardioverter/defibrillator) present   . Acute ischemic right MCA stroke (HCC) 10/07/2020  . Middle cerebral artery embolism, right 10/07/2020  . Chest pain 11/16/2017    Delphia Grates. Fairly IV, PT, DPT Physical Therapist- [facility]  12/17/2020, 12:59 PM  [place] [facility] MAIN [facility] SERVICES [address] [place], [place], [address] Phone: [phone]   Fax:  [phone]  Name: Rebecca Singh MRN: [id number] Date of Birth: May 19, 1956

## 2020-12-19 ENCOUNTER — Other Ambulatory Visit: Payer: Self-pay | Admitting: Registered Nurse

## 2020-12-22 ENCOUNTER — Other Ambulatory Visit: Payer: Self-pay

## 2020-12-22 ENCOUNTER — Ambulatory Visit: Payer: Medicare HMO

## 2020-12-22 DIAGNOSIS — R269 Unspecified abnormalities of gait and mobility: Secondary | ICD-10-CM

## 2020-12-22 DIAGNOSIS — M6281 Muscle weakness (generalized): Secondary | ICD-10-CM

## 2020-12-22 DIAGNOSIS — R2681 Unsteadiness on feet: Secondary | ICD-10-CM

## 2020-12-22 DIAGNOSIS — R278 Other lack of coordination: Secondary | ICD-10-CM

## 2020-12-22 NOTE — Therapy (Signed)
[place] [facility] MAIN [facility] SERVICES [address] [place], [place], [address] Phone: [phone]   Fax:  [phone]  Physical Therapy Treatment  Patient Details  Name: Rebecca Singh MRN: [id number] Date of Birth: Nov 02, 1955 Referring Provider (PT): Mariam Dollar   Encounter Date: 12/22/2020   PT End of Session - 12/22/20 1017    Visit Number 7    Number of Visits 25    Date for PT Re-Evaluation 02/04/21    PT Start Time 1015    PT Stop Time 1059    PT Time Calculation (min) 44 min    Equipment Utilized During Treatment Gait belt    Activity Tolerance Patient tolerated treatment well;Other (comment)   some tests not performed due to patient needing to be in treatment room for evaluation due to overstimulation out in clinic   Behavior During Therapy [facility] for tasks assessed/performed   Patient with some agitation during evaluation          Past Medical History:  Diagnosis Date  . AICD (automatic cardioverter/defibrillator) present   . COPD (chronic obstructive pulmonary disease) (HCC)   . Hypothyroidism   . Myocardial infarction (HCC)   . Pacemaker/defibrillator   . Stroke [facility])     Past Surgical History:  Procedure Laterality Date  . ABDOMINAL HYSTERECTOMY    . CHOLECYSTECTOMY    . INSERT / REPLACE / REMOVE PACEMAKER    . IR CT HEAD LTD  10/07/2020  . IR PERCUTANEOUS ART THROMBECTOMY/INFUSION INTRACRANIAL INC DIAG ANGIO  10/07/2020  . RADIOLOGY WITH ANESTHESIA N/A 10/07/2020   Procedure: IR WITH ANESTHESIA - CODE STROKE;  Surgeon: Radiologist, Medication, MD;  Location: [facility] OR;  Service: Radiology;  Laterality: N/A;  . THYROIDECTOMY    . TONSILLECTOMY      There were no vitals filed for this visit.   Subjective Assessment - 12/22/20 1016    Subjective Patient reports continuing to do more including going out over the weekned to a car/bike show. She reports using her walker for the event with some assistance over grassy terrain  from her grandson.    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 unrestricted    How long can you stand comfortably? 2-3 mins    How long can you walk comfortably? 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 No/denies    Pain Onset 1 to 4 weeks ago               Interventions:  Neuromuscular re-education:   Tandem stance - hold 20 sec x 3 sets each LE- Initial difficulty with intermittent UE touch bar for balance but quickly progressed to no UE support.  Tandem forward gait- x approx 6 steps x 5 sets- Improved with practice- slow and controlled  Single leg stance- BLE- Increased difficulty with standing on right LE today but did vastly improve with practice- up to 10 sec each leg after 5 trials each.    Gait with horizontal head turning x 60 feet x 4- Mild gait deviation with decreased gait velocity.  Gait with vertical head nod x 60 feet x 4- Mild change(decrease) in gait velocity and difficulty  maintaining straight path initially but did improve well with practice with no loss of balance today.   Education provided throughout session via VC/TC and demonstration to facilitate movement at target joints and correct muscle activation for all testing and exercises performed.    Access Code: [email or web address] URL: [email or web address] Date: 12/22/2020 Prepared by: Maureen Ralphs  Exercises Standing Single Leg Stance with Counter Support - 1 x daily - 5 x weekly - 4 sets - 20 hold Tandem Walking with Counter Support - 1 x daily - 5 x weekly - 4 sets Walking with Head Rotation - 1 x daily - 5 x weekly - 4 sets  Clinical  Impression: Patient challenged with progression of balance activities today yet responded well to all cueing and instruction and able to improve balance with all activities with practice. Issued handout for HEP and patient verbalized understanding. The patient will continue to benefit from additional skilled PT services to improved strength and balance for improved gait abilities and improve quality of life                   PT Education - 12/22/20 1103    Education Details Access Code: [email or web address]  URL: [email or web address]  Date: 12/22/2020  Prepared by: Maureen Ralphs    Exercises  Standing Single Leg Stance with Counter Support - 1 x daily - 5 x weekly - 4 sets - 20 hold  Tandem Walking with Counter Support - 1 x daily - 5 x weekly - 4 sets  Walking with Head Rotation - 1 x daily - 5 x weekly - 4 sets    Person(s) Educated Patient    Methods Explanation;Demonstration;Tactile cues;Verbal cues;Handout    Comprehension Verbalized understanding;Returned demonstration;Verbal cues required;Tactile cues required            PT Short Term Goals - 11/12/20 1002      PT SHORT TERM GOAL #1   Title Patient will be independent in home exercise program to improve strength/mobility for better functional independence with ADLs.    Status New    Target Date 02/04/21             PT Long Term Goals - 11/24/20 2230      PT LONG TERM GOAL #1   Title Patient will increase FOTO score to equal to or greater than 62% to demonstrate statistically significant improvement in mobility and quality of life.    Baseline 11/12/20 62%    Time 12    Period Weeks    Status New    Target Date 02/04/21      PT LONG TERM GOAL #2   Title Patient (> 97 years old) will complete five times sit to stand test in < 15 seconds indicating an increased LE strength and improved balance.    Baseline 11/12/20 28.82    Time 12    Period Weeks    Status New    Target Date 02/04/21      PT LONG  TERM GOAL #3   Title Patient will increase 10 meter walk test to >1.87m/s as to improve gait speed for better community ambulation and to reduce fall risk.    Baseline 11/12/20  not assessed. 11/24/2020= 0.7 m/s using front wheeled walker    Time 12    Period Weeks    Status New      PT LONG TERM GOAL #4   Title Patient will increase Berg Balance score to at least 45/56  points to demonstrate decreased fall  risk during functional activities.    Baseline 11/12/20  not assessed. 11/24/2020- Patient declined to assess    Time 12    Period Weeks    Status New    Target Date 02/04/21                 Plan - 12/22/20 1018    Clinical Impression Statement Patient challenged with progression of balance activities today yet responded well to all cueing and instruction and able to improve balance with all activities with practice. Issued handout for HEP and patient verbalized understanding. The patient will continue to benefit from additional skilled PT services to improved strength and balance for improved gait abilities and improve quality of life    Personal Factors and Comorbidities Comorbidity 3+    Comorbidities Left TKA 10/21, pacemaker, COPD, HTN, prediabetic    Examination-Activity Limitations Bathing;Dressing;Transfers;Lift;Squat;Locomotion Level;Stairs;Carry;Stand    Examination-Participation Restrictions Cleaning;Community Activity;Driving;Meal Prep;Occupation;Shop    Stability/Clinical Decision Making Evolving/Moderate complexity    Rehab Potential Good    PT Frequency 2x / week    PT Duration 12 weeks    PT Treatment/Interventions ADLs/Self Care Home Management;Gait training;Stair training;Functional mobility training;Therapeutic activities;Therapeutic exercise;Balance training;Neuromuscular re-education;Patient/family education;Visual/perceptual remediation/compensation    PT Next Visit Plan Progress standing LE strengthening and continue with gait training as appropriate.     Consulted and Agree with Plan of Care Patient;Family member/caregiver    Family Member Consulted niece           Patient will benefit from skilled therapeutic intervention in order to improve the following deficits and impairments:  Abnormal gait,Decreased balance,Decreased endurance,Decreased mobility,Difficulty walking,Decreased range of motion,Decreased activity tolerance,Decreased strength,Impaired flexibility,Impaired UE functional use  Visit Diagnosis: Muscle weakness (generalized)  Other lack of coordination  Unsteadiness on feet  Abnormality of gait and mobility     Problem List Patient Active Problem List   Diagnosis Date Noted  . Loose stools   . Visual disturbance   . AKI (acute kidney injury) (HCC)   . Hemiparesis affecting left side as late effect of stroke (HCC)   . Leukocytosis   . Urge incontinence   . Chronic systolic congestive heart failure (HCC)   . Vascular headache   . Anxiety state   . 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 Acute myopericarditis   . SAH (subarachnoid hemorrhage) (HCC)   . Tobacco abuse   . Chronic pain syndrome   . Prediabetes   . Nonischemic cardiomyopathy (HCC)   . AICD (automatic cardioverter/defibrillator) present   . Acute ischemic right MCA stroke (HCC) 10/07/2020  . Middle cerebral artery embolism, right 10/07/2020  . Chest pain 11/16/2017    Lenda Kelp, PT 12/23/2020, 8:13 AM  [place] [facility] MAIN [facility] SERVICES [address] [place], [place], [address] Phone: [phone]   Fax:  [phone]  Name: CHARLETHA DALPE MRN: [id number] Date of Birth: 15-Nov-1955

## 2020-12-24 ENCOUNTER — Other Ambulatory Visit: Payer: Self-pay

## 2020-12-24 ENCOUNTER — Encounter: Payer: Self-pay | Admitting: Occupational Therapy

## 2020-12-24 ENCOUNTER — Ambulatory Visit: Payer: Medicare HMO | Admitting: Occupational Therapy

## 2020-12-24 DIAGNOSIS — M6281 Muscle weakness (generalized): Secondary | ICD-10-CM

## 2020-12-24 DIAGNOSIS — R278 Other lack of coordination: Secondary | ICD-10-CM

## 2020-12-24 NOTE — Therapy (Signed)
[facility] MAIN [facility] SERVICES [address] [place], [place], [address] Phone: [phone]   Fax:  [phone]  Occupational Therapy Treatment  Patient Details  Name: Rebecca Singh MRN: [id number] Date of Birth: 06-18-1956 No data recorded  Encounter Date: 12/24/2020   OT End of Session - 12/24/20 1311    Visit Number 5    Number of Visits 24    Date for OT Re-Evaluation 02/13/21    Authorization Time Period Progress report period starting 11/22/2020    OT Start Time 1302    OT Stop Time 1345    OT Time Calculation (min) 43 min    Activity Tolerance Patient tolerated treatment well    Behavior During Therapy [facility] for tasks assessed/performed           Past Medical History:  Diagnosis Date  . AICD (automatic cardioverter/defibrillator) present   . COPD (chronic obstructive pulmonary disease) (HCC)   . Hypothyroidism   . Myocardial infarction (HCC)   . Pacemaker/defibrillator   . Stroke [facility])     Past Surgical History:  Procedure Laterality Date  . ABDOMINAL HYSTERECTOMY    . CHOLECYSTECTOMY    . INSERT / REPLACE / REMOVE PACEMAKER    . IR CT HEAD LTD  10/07/2020  . IR PERCUTANEOUS ART THROMBECTOMY/INFUSION INTRACRANIAL INC DIAG ANGIO  10/07/2020  . RADIOLOGY WITH ANESTHESIA N/A 10/07/2020   Procedure: IR WITH ANESTHESIA - CODE STROKE;  Surgeon: Radiologist, Medication, MD;  Location: [facility] OR;  Service: Radiology;  Laterality: N/A;  . THYROIDECTOMY    . TONSILLECTOMY      There were no vitals filed for this visit.   Subjective Assessment - 12/24/20 1309    Subjective  Pt. reports that her head feels better.    Pertinent History CVA on 10-07-2020 and discharged on 10-24-2020. 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 No/denies          OT TREATMENT    Neuro muscular  re-education:  Pt. worked on gross digit extension with emphasis placed on active controlled releasing of objects 1.5" pegs. Pt. worked on [employer] of smaller pegs removing them from a pegboard while actively releasing them into a container. Pt. worked on grasping coins washers, and storing them in the palm of her hand.  Therapeutic Exercise:  Pt. worked on the BUE UBE in the forward motion while seated for 5 min. & 24 sec. Pt. performed AROM digit extension at the tabletop surface, place and hold with resistance.   Pt. presented with less tone, and tightness in the left hand today. Pt. continues to require weightbearing, and proprioception through the left hand between task reps. Pt. continues to work on improving left hand function in order to work towards improving engagement during ADL tasks, and maximizing independence with ADLs, and IADLs.                            OT Education - 12/24/20 1311    Education Details coordination    Person(s) Educated Patient    Methods Explanation;Demonstration    Comprehension Verbalized understanding;Returned demonstration               OT Long Term Goals - 12/04/20 1446      OT LONG TERM GOAL #1   Title Patient will be modified  independent with home exercise program    Baseline Eval: No current program    Time 12    Period Weeks    Status New    Target Date 02/13/21      OT LONG TERM GOAL #2   Title Patient will improve grip strength of left hand by 10# to be able to stabilize and cut food with modified independence.    Baseline Eval: unable    Time 12    Period Weeks    Status New    Target Date 02/13/21      OT LONG TERM GOAL #3   Title Patient will demonstrate washing dishes in standing for up to 10 mins without rest breaks with modified independence.    Baseline Eval difficulty with task.    Time 8    Period Weeks    Status New    Target Date 01/16/21      OT LONG TERM GOAL #4   Title  Patient will increase strength in left shoulder by 1 mm grade to place clothing in closet with modified independence.    Baseline Eval:  strength 2/5    Time 12    Period Weeks    Status New    Target Date 02/13/21      OT LONG TERM GOAL #5   Title Pt will improve functional hand strength to open jars and containers with occasional min assist.    Baseline Eval: unable to open jars and containers    Time 12    Period Weeks    Status New    Target Date 02/13/21      Long Term Additional Goals   Additional Long Term Goals --      OT LONG TERM GOAL #6   Title Pt will complete light meal prep with modified independence    Baseline Eval: unable    Time 12    Period Weeks    Status New    Target Date 02/13/21      OT LONG TERM GOAL #7   Title Patient will decrease 9 hole peg test  by 15 secs to manipulate small objects with modified independence and with good speed.    Baseline Eval: 50secs    Time 8    Period Weeks    Status New    Target Date 02/13/21      OT LONG TERM GOAL #8   Title Pt will improve FOTO score to 57 or above to show a clinically signifcant change function to complete daily tasks with greater independence.    Baseline Eval: 44    Time 12    Period Weeks    Status New    Target Date 02/13/21                 Plan - 12/24/20 1314    Clinical Impression Statement p    OT Occupational Profile and History Detailed Assessment- Review of Records and additional review of physical, cognitive, psychosocial history related to current functional performance    Occupational performance deficits (Please refer to evaluation for details): ADL's;IADL's;Work;Leisure    Body Structure / Function / Physical Skills ADL;Dexterity;Flexibility;ROM;Strength;Balance;Coordination;FMC;IADL;Endurance;Gait;Pain;UE functional use;Decreased knowledge of use of DME;GMC;Mobility    Cognitive Skills Attention;Memory;Safety Awareness    Psychosocial Skills Environmental   Adaptations;Habits;Routines and Behaviors    Rehab Potential Good    Clinical Decision Making Several treatment options, min-mod task modification necessary    Comorbidities Affecting Occupational Performance: May have comorbidities impacting occupational  performance    Modification or Assistance to Complete Evaluation  No modification of tasks or assist necessary to complete eval    OT Frequency 2x / week    OT Duration 12 weeks    OT Treatment/Interventions Cryotherapy;Self-care/ADL training;Therapeutic exercise;DME and/or AE instruction;Functional Mobility Training;Cognitive remediation/compensation;Neuromuscular education;Manual Therapy;Moist Heat;Contrast Bath;Energy conservation;Therapeutic activities;Passive range of motion;Patient/family education    Consulted and Agree with Plan of Care Patient;Family member/caregiver           Patient will benefit from skilled therapeutic intervention in order to improve the following deficits and impairments:   Body Structure / Function / Physical Skills: ADL,Dexterity,Flexibility,ROM,Strength,Balance,Coordination,FMC,IADL,Endurance,Gait,Pain,UE functional use,Decreased knowledge of use of DME,GMC,Mobility Cognitive Skills: Attention,Memory,Safety Awareness Psychosocial Skills: Environmental  Adaptations,Habits,Routines and Behaviors   Visit Diagnosis: Muscle weakness (generalized)  Other lack of coordination    Problem List Patient Active Problem List   Diagnosis Date Noted  . Loose stools   . Visual disturbance   . AKI (acute kidney injury) (HCC)   . Hemiparesis affecting left side as late effect of stroke (HCC)   . Leukocytosis   . Urge incontinence   . Chronic systolic congestive heart failure (HCC)   . Vascular headache   . Anxiety state   . 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 Acute myopericarditis   . SAH (subarachnoid hemorrhage) (HCC)   . Tobacco abuse   . Chronic pain syndrome   . Prediabetes   .  Nonischemic cardiomyopathy (HCC)   . AICD (automatic cardioverter/defibrillator) present   . Acute ischemic right MCA stroke (HCC) 10/07/2020  . Middle cerebral artery embolism, right 10/07/2020  . Chest pain 11/16/2017    Olegario Messier, MS, OTR/L 12/24/2020, 1:18 PM  [place] [facility] MAIN [facility] SERVICES [address] [place], [place], [address] Phone: [phone]   Fax:  [phone]  Name: Rebecca Singh MRN: [id number] Date of Birth: 1956-06-07

## 2020-12-30 ENCOUNTER — Ambulatory Visit: Payer: Medicare HMO | Admitting: Occupational Therapy

## 2020-12-30 ENCOUNTER — Ambulatory Visit: Payer: Medicare HMO | Admitting: Physical Therapy

## 2021-01-01 ENCOUNTER — Ambulatory Visit: Payer: Medicare HMO

## 2021-01-01 ENCOUNTER — Other Ambulatory Visit: Payer: Self-pay

## 2021-01-01 DIAGNOSIS — M6281 Muscle weakness (generalized): Secondary | ICD-10-CM

## 2021-01-01 DIAGNOSIS — R269 Unspecified abnormalities of gait and mobility: Secondary | ICD-10-CM

## 2021-01-01 DIAGNOSIS — R262 Difficulty in walking, not elsewhere classified: Secondary | ICD-10-CM

## 2021-01-01 NOTE — Therapy (Signed)
[facility] MAIN [facility] SERVICES [address] [place], [place], [address] Phone: [phone]   Fax:  [phone]  Physical Therapy Treatment  Patient Details  Name: Rebecca Singh MRN: [id number] Date of Birth: 02-Nov-1955 Referring Provider (PT): Mariam Dollar   Encounter Date: 01/01/2021   PT End of Session - 01/01/21 1024    Visit Number 8    Number of Visits 25    Date for PT Re-Evaluation 02/04/21    PT Start Time 1019    Equipment Utilized During Treatment Gait belt    Activity Tolerance Patient tolerated treatment well;Other (comment)   some tests not performed due to patient needing to be in treatment room for evaluation due to overstimulation out in clinic   Behavior During Therapy [facility] for tasks assessed/performed   Patient with some agitation during evaluation          Past Medical History:  Diagnosis Date  . AICD (automatic cardioverter/defibrillator) present   . COPD (chronic obstructive pulmonary disease) (HCC)   . Hypothyroidism   . Myocardial infarction (HCC)   . Pacemaker/defibrillator   . Stroke [facility])     Past Surgical History:  Procedure Laterality Date  . ABDOMINAL HYSTERECTOMY    . CHOLECYSTECTOMY    . INSERT / REPLACE / REMOVE PACEMAKER    . IR CT HEAD LTD  10/07/2020  . IR PERCUTANEOUS ART THROMBECTOMY/INFUSION INTRACRANIAL INC DIAG ANGIO  10/07/2020  . RADIOLOGY WITH ANESTHESIA N/A 10/07/2020   Procedure: IR WITH ANESTHESIA - CODE STROKE;  Surgeon: Radiologist, Medication, MD;  Location: [facility] OR;  Service: Radiology;  Laterality: N/A;  . THYROIDECTOMY    . TONSILLECTOMY      There were no vitals filed for this visit.   Subjective Assessment - 01/01/21 1022    Subjective Patient reports missing last week due to having migraine headaches and still having headache today- rates at a 5/10 today.    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 unrestricted    How long can you stand comfortably? 2-3 mins    How long can you walk comfortably? 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 Yes    Pain Score 5     Pain Location Head    Pain Descriptors / Indicators Aching    Pain Type Chronic pain    Pain Onset 1 to 4 weeks ago           Seated therex secondary to patient complaining of headache 5/10  Seated hip march- 3# BLE x 10 reps Seated knee ext- 3#  BLE x 10 reps Seated hip abd 3# BLE x 10 reps Seated heel raises 3# BLE x 10 reps Seated Toe raises 3# BLE x 10 reps Education provided throughout session via VC/TC and demonstration to facilitate movement at target joints and correct muscle activation for all testing and exercises performed.   Gait training  Adjusted cane to patient height and educated on how to adjust her cane at home.  1) Hurrycane- patient ambulated approx 150 feet, CGA with short reciprocal steps - min VC for sequencing- Patient improved overall with practice with no loss of  balance.  2) Straight point cane- Patient performed well for another 150 feet with CGA today and short reciprocal steps and min VCs.     Clinical impression: Patient initially agreeable to seated therex due to having a 5/10 headache. After completing seated therex well - patient was agreeable to gait training using cane today. Instructed patient in cane height and then gait training with cane. Patient performed well with minimal VCs for technique/sequencing today. The patient will continue to benefit from additional skilled PT services to improved strength and balance for improved gait abilities and improve quality of  life                        PT Short Term Goals - 11/12/20 1002      PT SHORT TERM GOAL #1   Title Patient will be independent in home exercise program to improve strength/mobility for better functional independence with ADLs.    Status New    Target Date 02/04/21             PT Long Term Goals - 11/24/20 2230      PT LONG TERM GOAL #1   Title Patient will increase FOTO score to equal to or greater than 62% to demonstrate statistically significant improvement in mobility and quality of life.    Baseline 11/12/20 62%    Time 12    Period Weeks    Status New    Target Date 02/04/21      PT LONG TERM GOAL #2   Title Patient (> 65 years old) will complete five times sit to stand test in < 15 seconds indicating an increased LE strength and improved balance.    Baseline 11/12/20 28.82    Time 12    Period Weeks    Status New    Target Date 02/04/21      PT LONG TERM GOAL #3   Title Patient will increase 10 meter walk test to >1.71m/s as to improve gait speed for better community ambulation and to reduce fall risk.    Baseline 11/12/20  not assessed. 11/24/2020= 0.7 m/s using front wheeled walker    Time 12    Period Weeks    Status New      PT LONG TERM GOAL #4   Title Patient will increase Berg Balance score to at least 45/56  points to demonstrate decreased fall risk during functional activities.    Baseline 11/12/20  not assessed. 11/24/2020- Patient declined to assess    Time 12    Period Weeks    Status New    Target Date 02/04/21                 Plan - 01/01/21 1025    Clinical Impression Statement Patient initially agreeable to seated therex due to having a 5/10 headache. After completing seated therex well - patient was agreeable to gait training using cane today. Instructed patient in cane height and then gait training with cane. Patient performed well with minimal VCs for technique/sequencing today. The patient will continue to benefit from  additional skilled PT services to improved strength and balance for improved gait abilities and improve quality of life    Personal Factors and Comorbidities Comorbidity 3+    Comorbidities Left TKA 10/21, pacemaker, COPD, HTN, prediabetic    Examination-Activity Limitations Bathing;Dressing;Transfers;Lift;Squat;Locomotion Level;Stairs;Carry;Stand    Examination-Participation Restrictions Cleaning;Community Activity;Driving;Meal Prep;Occupation;Shop    Stability/Clinical Decision Making Evolving/Moderate complexity    Rehab Potential  Good    PT Frequency 2x / week    PT Duration 12 weeks    PT Treatment/Interventions ADLs/Self Care Home Management;Gait training;Stair training;Functional mobility training;Therapeutic activities;Therapeutic exercise;Balance training;Neuromuscular re-education;Patient/family education;Visual/perceptual remediation/compensation    PT Next Visit Plan Progress standing LE strengthening and continue with gait training as appropriate.    PT Home Exercise Plan practice gait with single point cane    Consulted and Agree with Plan of Care Patient;Family member/caregiver    Family Member Consulted niece           Patient will benefit from skilled therapeutic intervention in order to improve the following deficits and impairments:  Abnormal gait,Decreased balance,Decreased endurance,Decreased mobility,Difficulty walking,Decreased range of motion,Decreased activity tolerance,Decreased strength,Impaired flexibility,Impaired UE functional use  Visit Diagnosis: Abnormality of gait and mobility  Difficulty in walking, not elsewhere classified  Muscle weakness (generalized)     Problem List Patient Active Problem List   Diagnosis Date Noted  . Loose stools   . Visual disturbance   . AKI (acute kidney injury) (HCC)   . Hemiparesis affecting left side as late effect of stroke (HCC)   . Leukocytosis   . Urge incontinence   . Chronic systolic congestive heart  failure (HCC)   . Vascular headache   . Anxiety state   . 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 Acute myopericarditis   . SAH (subarachnoid hemorrhage) (HCC)   . Tobacco abuse   . Chronic pain syndrome   . Prediabetes   . Nonischemic cardiomyopathy (HCC)   . AICD (automatic cardioverter/defibrillator) present   . Acute ischemic right MCA stroke (HCC) 10/07/2020  . Middle cerebral artery embolism, right 10/07/2020  . Chest pain 11/16/2017    Lenda Kelp, PT 01/01/2021, 4:22 PM  [place] [facility] MAIN [facility] SERVICES [address] [place], [place], [address] Phone: [phone]   Fax:  [phone]  Name: Rebecca Singh MRN: [id number] Date of Birth: [birth year]

## 2021-01-05 MED ORDER — AMITRIPTYLINE HCL 50 MG PO TABS: 50.0000 mg | ORAL_TABLET | Freq: Every day | ORAL | 5 refills | Status: DC

## 2021-01-05 NOTE — Addendum Note (Signed)
Addended by: Hermenia Fiscal S on: 01/05/2021 01:00 PM   Modules accepted: Orders

## 2021-01-05 NOTE — Addendum Note (Signed)
Addended by: Guy Begin on: 01/05/2021 11:43 AM   Modules accepted: Orders

## 2021-01-06 ENCOUNTER — Other Ambulatory Visit: Payer: Self-pay

## 2021-01-06 ENCOUNTER — Ambulatory Visit: Payer: Medicare HMO | Admitting: Physical Therapy

## 2021-01-06 ENCOUNTER — Encounter: Payer: Self-pay | Admitting: Physical Therapy

## 2021-01-06 DIAGNOSIS — M6281 Muscle weakness (generalized): Secondary | ICD-10-CM | POA: Diagnosis not present

## 2021-01-06 DIAGNOSIS — R262 Difficulty in walking, not elsewhere classified: Secondary | ICD-10-CM

## 2021-01-06 DIAGNOSIS — R2681 Unsteadiness on feet: Secondary | ICD-10-CM

## 2021-01-06 DIAGNOSIS — R269 Unspecified abnormalities of gait and mobility: Secondary | ICD-10-CM

## 2021-01-06 DIAGNOSIS — R278 Other lack of coordination: Secondary | ICD-10-CM

## 2021-01-06 NOTE — Therapy (Signed)
[facility] MAIN [facility] SERVICES [address] [place], [place], [address] Phone: [phone]   Fax:  [phone]  Physical Therapy Treatment  Patient Details  Name: Rebecca Singh MRN: [id number] Date of Birth: [birth year] Referring Provider (PT): Mariam Dollar   Encounter Date: 01/06/2021   PT End of Session - 01/06/21 1028    Visit Number 9    Number of Visits 25    Date for PT Re-Evaluation 02/04/21    PT Start Time 1025    PT Stop Time 1110    PT Time Calculation (min) 45 min    Equipment Utilized During Treatment Gait belt    Activity Tolerance Patient tolerated treatment well;Other (comment)   some tests not performed due to patient needing to be in treatment room for evaluation due to overstimulation out in clinic   Behavior During Therapy [facility] for tasks assessed/performed   Patient with some agitation during evaluation          Past Medical History:  Diagnosis Date  . AICD (automatic cardioverter/defibrillator) present   . COPD (chronic obstructive pulmonary disease) (HCC)   . Hypothyroidism   . Myocardial infarction (HCC)   . Pacemaker/defibrillator   . Stroke [facility])     Past Surgical History:  Procedure Laterality Date  . ABDOMINAL HYSTERECTOMY    . CHOLECYSTECTOMY    . INSERT / REPLACE / REMOVE PACEMAKER    . IR CT HEAD LTD  10/07/2020  . IR PERCUTANEOUS ART THROMBECTOMY/INFUSION INTRACRANIAL INC DIAG ANGIO  10/07/2020  . RADIOLOGY WITH ANESTHESIA N/A 10/07/2020   Procedure: IR WITH ANESTHESIA - CODE STROKE;  Surgeon: Radiologist, Medication, MD;  Location: [facility] OR;  Service: Radiology;  Laterality: N/A;  . THYROIDECTOMY    . TONSILLECTOMY      There were no vitals filed for this visit.   Subjective Assessment - 01/06/21 1027    Subjective Patient reports doing well. She denies any pain; She presents to therapy with SPC. 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 unrestricted    How long can you stand comfortably? 2-3 mins    How long can you walk comfortably? 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 No/denies    Pain Onset 1 to 4 weeks ago    Multiple Pain Sites No              TREATMENT: Warm up on Nustep BUE/BLE level 2 x4 min (unbilled); Standing with 3# ankle weight: -hip flexion march x10 reps; -hip abduction SLR x10 reps; -Hip extension SLR x10 reps; -Side stepping x10 feet x2 laps each without rail assist to challenge dynamic balance; Required close supervision for safety;  Patient required min-moderate verbal/tactile cues for correct exercise technique including to increase ROM to tolerance and improve body position for better muscle activation;   Seated LAQ 3# with ankle DF x10 reps each LE;   Neuromuscular re-education:    Standing on firm surface:  -feet together ,eyes open: 30 sec hold , eyes closed 15 sec hold x2 sets each, mild-moderate sway noted, close supervision; -staggered stance: eyes open 15 sec hold  x2 reps each foot in front with CGA to close supervision; -staggered stance, forward/backward weight shift unsupported x10 reps with close supervision for safety;  Forward/backward step over orange hurdle with 2 rail assist x10 reps; Patient hesitant having difficulty stepping backwards with heavy HHA and visual cues with mirror;   Side stepping over orange hurdle with 1 rail assist x5 reps each direction;  Patient exhibits better foot clearance and ease with side stepping over hurdle;   Standing on firm surface:  -feet together ,eyes open: 30 sec hold , eyes closed 15 sec hold x1 sets each,  mild-moderate sway noted, close supervision; -staggered stance: eyes open 15 sec hold x1 reps each foot in front with CGA to close supervision; Patient able to exhibit better stance control with less unsteadiness during 2nd bout of standing compared to initial balance exercise;   Patient tolerated session fair. She does report increased fatigue with prolonged standing requiring intermittent rest breaks; Patient initially exhibits increased unsteadiness and hesitation with static standing feet together. However following balance exercise was able to exhibit better stance control with less sway and better confidence with feet together;                         PT Education - 01/06/21 1028    Education Details LE strength, balance, HEP    Person(s) Educated Patient    Methods Explanation;Verbal cues    Comprehension Verbalized understanding;Returned demonstration;Verbal cues required;Need further instruction            PT Short Term Goals - 11/12/20 1002      PT SHORT TERM GOAL #1   Title Patient will be independent in home exercise program to improve strength/mobility for better functional independence with ADLs.    Status New    Target Date 02/04/21             PT Long Term Goals - 11/24/20 2230      PT LONG TERM GOAL #1   Title Patient will increase FOTO score to equal to or greater than 62% to demonstrate statistically significant improvement in mobility and quality of life.    Baseline 11/12/20 62%    Time 12    Period Weeks    Status New    Target Date 02/04/21      PT LONG TERM GOAL #2   Title Patient (> 47 years old) will complete five times sit to stand test in < 15 seconds indicating an increased LE strength and improved balance.    Baseline 11/12/20 28.82    Time 12    Period Weeks    Status New    Target Date 02/04/21      PT LONG TERM GOAL #3   Title Patient will increase 10 meter walk test to >1.50m/s as to improve gait speed for better  community ambulation and to reduce fall risk.    Baseline 11/12/20  not assessed. 11/24/2020= 0.7 m/s using front wheeled walker    Time 12    Period Weeks    Status New      PT LONG TERM GOAL #4   Title Patient will increase Berg Balance score to at least 45/56  points to demonstrate decreased fall risk during functional activities.    Baseline 11/12/20  not assessed. 11/24/2020- Patient declined to assess    Time 12    Period Weeks    Status New    Target Date 02/04/21  Plan - 01/06/21 1036    Clinical Impression Statement Patient tolerated session fair. She was hesitant with LE strengthening exercise, reporting increased fatigue. patient requires min VCs for proper positioning and exercise technique for optimal muscle activation. Patient does require frequent seated rest break for better energy conservation. Patient instructed in advanced balance exercise. She was hesitant to stand with narrow base of support exhibiting increased unsteadiness while on firm surface. Patient had a difficult time with directional change stepping forward/backward over hurdle requiring increased time and effort with BUE rail assist. She was able to exhibit better stance control with less unsteadiness during 2nd bout of static standing with improvement noted. Patient would benefit from additional skilled PT Intervention to improve strength, balance and mobility;    Personal Factors and Comorbidities Comorbidity 3+    Comorbidities Left TKA 10/21, pacemaker, COPD, HTN, prediabetic    Examination-Activity Limitations Bathing;Dressing;Transfers;Lift;Squat;Locomotion Level;Stairs;Carry;Stand    Examination-Participation Restrictions Cleaning;Community Activity;Driving;Meal Prep;Occupation;Shop    Stability/Clinical Decision Making Evolving/Moderate complexity    Rehab Potential Good    PT Frequency 2x / week    PT Duration 12 weeks    PT Treatment/Interventions ADLs/Self Care Home Management;Gait  training;Stair training;Functional mobility training;Therapeutic activities;Therapeutic exercise;Balance training;Neuromuscular re-education;Patient/family education;Visual/perceptual remediation/compensation    PT Next Visit Plan Progress standing LE strengthening and continue with gait training as appropriate.    PT Home Exercise Plan practice gait with single point cane    Consulted and Agree with Plan of Care Patient;Family member/caregiver    Family Member Consulted niece           Patient will benefit from skilled therapeutic intervention in order to improve the following deficits and impairments:  Abnormal gait,Decreased balance,Decreased endurance,Decreased mobility,Difficulty walking,Decreased range of motion,Decreased activity tolerance,Decreased strength,Impaired flexibility,Impaired UE functional use  Visit Diagnosis: Abnormality of gait and mobility  Difficulty in walking, not elsewhere classified  Muscle weakness (generalized)  Other lack of coordination  Unsteadiness on feet     Problem List Patient Active Problem List   Diagnosis Date Noted  . Loose stools   . Visual disturbance   . AKI (acute kidney injury) (HCC)   . Hemiparesis affecting left side as late effect of stroke (HCC)   . Leukocytosis   . Urge incontinence   . Chronic systolic congestive heart failure (HCC)   . Vascular headache   . Anxiety state   . 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 Acute myopericarditis   . SAH (subarachnoid hemorrhage) (HCC)   . Tobacco abuse   . Chronic pain syndrome   . Prediabetes   . Nonischemic cardiomyopathy (HCC)   . AICD (automatic cardioverter/defibrillator) present   . Acute ischemic right MCA stroke (HCC) 10/07/2020  . Middle cerebral artery embolism, right 10/07/2020  . Chest pain 11/16/2017    Cruzito Standre  PT, DPT 01/06/2021, 11:18 AM  [facility] MAIN [facility] SERVICES [address]  [place], [place], [address] Phone: [phone]   Fax:  [phone]  Name: Rebecca Singh MRN: [id number] Date of Birth: October 31, 1955

## 2021-01-08 ENCOUNTER — Other Ambulatory Visit: Payer: Self-pay

## 2021-01-08 ENCOUNTER — Ambulatory Visit: Payer: Medicare HMO | Admitting: Physical Therapy

## 2021-01-08 NOTE — Patient Outreach (Signed)
[facility]) Care Management  01/08/2021  Rebecca Singh 12-24-1955 [id number]   Telephone outreach to patient to obtain mRS was successfully completed. MRS= 1  Thank you, Vanice Sarah [facility] Care Management Assistant

## 2021-01-12 ENCOUNTER — Ambulatory Visit: Payer: Medicare HMO | Attending: Physician Assistant

## 2021-01-12 DIAGNOSIS — R278 Other lack of coordination: Secondary | ICD-10-CM | POA: Diagnosis present

## 2021-01-12 DIAGNOSIS — R269 Unspecified abnormalities of gait and mobility: Secondary | ICD-10-CM | POA: Insufficient documentation

## 2021-01-12 DIAGNOSIS — M6281 Muscle weakness (generalized): Secondary | ICD-10-CM | POA: Insufficient documentation

## 2021-01-12 DIAGNOSIS — R262 Difficulty in walking, not elsewhere classified: Secondary | ICD-10-CM | POA: Diagnosis present

## 2021-01-12 DIAGNOSIS — R2681 Unsteadiness on feet: Secondary | ICD-10-CM | POA: Diagnosis present

## 2021-01-12 NOTE — Therapy (Signed)
[facility] MAIN [facility] SERVICES [address] [place], [place], [address] Phone: [phone]   Fax:  [phone]  Physical Therapy Treatment Physical Therapy Progress Note   Dates of reporting period  11/12/20   to   01/12/21   Patient Details  Name: Rebecca Singh MRN: [id number] Date of Birth: 06/14/1956 Referring Provider (PT): Mariam Dollar PA-C   Encounter Date: 01/12/2021   PT End of Session - 01/12/21 1210    Visit Number 10    Number of Visits 25    Date for PT Re-Evaluation 02/04/21    Authorization Type [employer] HMO; 2* Medicaid    Authorization Time Period 11/12/20-02/04/21    PT Start Time 1108    PT Stop Time 1152    PT Time Calculation (min) 44 min    Equipment Utilized During Treatment Gait belt    Activity Tolerance Patient tolerated treatment well;No increased pain    Behavior During Therapy WFL for tasks assessed/performed           Past Medical History:  Diagnosis Date  . AICD (automatic cardioverter/defibrillator) present   . COPD (chronic obstructive pulmonary disease) (HCC)   . Hypothyroidism   . Myocardial infarction (HCC)   . Pacemaker/defibrillator   . Stroke [facility])     Past Surgical History:  Procedure Laterality Date  . ABDOMINAL HYSTERECTOMY    . CHOLECYSTECTOMY    . INSERT / REPLACE / REMOVE PACEMAKER    . IR CT HEAD LTD  10/07/2020  . IR PERCUTANEOUS ART THROMBECTOMY/INFUSION INTRACRANIAL INC DIAG ANGIO  10/07/2020  . RADIOLOGY WITH ANESTHESIA N/A 10/07/2020   Procedure: IR WITH ANESTHESIA - CODE STROKE;  Surgeon: Radiologist, Medication, MD;  [facility]: [facility] OR;  Service: Radiology;  Laterality: N/A;  . THYROIDECTOMY    . TONSILLECTOMY      There were no vitals filed for this visit.   Subjective Assessment - 01/12/21 1111    Subjective Pt denies any medical or medication updates since visit. She reports she feels to be making progress so far.    How long can you sit comfortably? unrestricted     How long can you stand comfortably? 5 minutes now; previously 2-3 mins    How long can you walk comfortably? 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 No/denies              [facility] PT Assessment - 01/12/21 0001      Assessment   Medical Diagnosis CVA    Referring Provider (PT) Mariam Dollar PA-C    Onset Date/Surgical Date 10/07/20    Hand Dominance Right      Precautions   Precautions Fall      Balance Screen   Has the patient fallen in the past 6 months Yes    How many times? 5    Has the patient had a decrease in activity level because of a fear of falling?  Yes    Is the patient reluctant to leave their home because of a fear of falling?  Yes      Prior Function   Level of Independence Independent    Vocation Full time employment   Has not been able to return to work since CVA   [employer] service    Leisure spending time with  daughter, beach      Observation/Other Assessments   Focus on Therapeutic Outcomes (FOTO)  54   48 at eval (PT FOTO)     Transfers   Five time sit to stand comments  14.36sec   hands free today;     Ambulation/Gait   Gait Comments : 11.6sec SS, 9.5sec fastest   0.37m/s; 1.77m/s     Balance   Balance Assessed Yes      Standardized Balance Assessment   Standardized Balance Assessment Berg Balance Test      Berg Balance Test   Sit to Stand Able to stand without using hands and stabilize independently    Standing Unsupported Able to stand safely 2 minutes    Sitting with Back Unsupported but Feet Supported on Floor or Stool Able to sit safely and securely 2 minutes    Stand to Sit Sits safely with minimal use of hands    Transfers Able to transfer safely, minor use of hands    Standing Unsupported with Eyes Closed Able to stand 10 seconds safely    Standing Unsupported with Feet  Together Able to place feet together independently and stand 1 minute safely    From Standing, Reach Forward with Outstretched Arm Can reach confidently >25 cm (10")    From Standing Position, Pick up Object from Floor Able to pick up shoe safely and easily    From Standing Position, Turn to Look Behind Over each Shoulder Looks behind one side only/other side shows less weight shift    Turn 360 Degrees Able to turn 360 degrees safely but slowly    Standing Unsupported, Alternately Place Feet on Step/Stool Able to complete 4 steps without aid or supervision   can do 8 at minGuard assist   Standing Unsupported, One Foot in Front Needs help to step but can hold 15 seconds    Standing on One Leg Tries to lift leg/unable to hold 3 seconds but remains standing independently    Total Score 45            STRENGTH:  Graded on a 0-5 scale  01/12/21 01/12/21 Eval  Eval   Muscle Group Left  Right  Left Right  Hip Flex 3+/5 3+/5 3/5 /5  Hip Abd Unable to tolerate testing position  Unable to tolerate testing position 4/5 /5  Hip Add Not tested Not tested 4/5 /5  Hip Ext 3-/5 Unable to achieve neutral in gravity resisted 3-/5 Unable to achieve neutral in gravity resisted 4/5 /5  Knee Flex 4-/5 4/5 3/5 /5  Knee Ext 5/5 5/5 3/5 /5  Ankle DF 3+/5 5/5 3/5 /5  Ankle PF   3+/5 /5  *All limitations with mild asterixis        PT Short Term Goals - 01/12/21 1207      PT SHORT TERM GOAL #1   Title Patient will be independent in home exercise program to improve strength/mobility for better functional independence with ADLs.    Status Achieved    Target Date 02/04/21             PT Long Term Goals - 01/12/21 1200      PT LONG TERM GOAL #1   Title Patient will increase FOTO score to equal to or greater than 62% to demonstrate statistically significant improvement in mobility and quality of life.    Baseline 11/12/20 48%; 01/12/21: 54/100    Time 12    Period Weeks    Status On-going  Target Date  02/04/21      PT LONG TERM GOAL #2   Title Patient (> 20 years old) will complete five times sit to stand test in < 15 seconds indicating an increased LE strength and improved balance.    Baseline 11/12/20 28.82 c 2 hands; 01/12/21: 14 sec    Time 12    Period Weeks    Status On-going    Target Date 02/04/21      PT LONG TERM GOAL #3   Title Patient will increase 10 meter walk test to >1.80m/s for imporved tolerance and increaed safety in community ambulation.    Baseline 11/12/20  not assessed. 11/24/2020= 0.7 m/s using front wheeled walker; 01/12/21: 0.32m/s and 1.39m/s SPC    Time 12    Period Weeks    Status On-going    Target Date 02/04/21      PT LONG TERM GOAL #4   Title Patient will increase Berg Balance score to at least 45/56  points to demonstrate decreased fall risk during functional activities.    Baseline 11/12/20  not assessed. 11/24/2020- Patient declined; 01/12/21: 45/56    Time 12    Period Weeks    Status On-going    Target Date 02/04/21                 Plan - 01/12/21 1213    Clinical Impression Statement Reassessmnent this date. Pt demonstrating minimal improvement in MMT, but largely remains very weak with testing. Large improvement in 5xSTS and noted, and pt able to tolerate testing BBT this date with score currently just under goal by 1 point, but not overwhelmed by anxiety of falling. Pt remains motivated to continue to progress her independence and safety in ADL, IADL, and eventually return to work. She remains very limited by leg stability on left with intermittent knee buckling when in single limb phase. Pt will continued to benefit from skilled PT intervention to maximize independence and safety in ADL, IADL, and work-required mobility.    Personal Factors and Comorbidities Comorbidity 3+    Comorbidities Left TKA 10/21, pacemaker, COPD, HTN, prediabetic    Examination-Activity Limitations Bathing;Dressing;Transfers;Lift;Squat;Locomotion  Level;Stairs;Carry;Stand    Examination-Participation Restrictions Cleaning;Community Activity;Driving;Meal Prep;Occupation;Shop    Stability/Clinical Decision Making Evolving/Moderate complexity    Clinical Decision Making Moderate    Rehab Potential Good    PT Frequency 2x / week    PT Duration 12 weeks    PT Treatment/Interventions ADLs/Self Care Home Management;Gait training;Stair training;Functional mobility training;Therapeutic activities;Therapeutic exercise;Balance training;Neuromuscular re-education;Patient/family education;Visual/perceptual remediation/compensation    PT Next Visit Plan Hamstrings, gluteal hip extension, standing quads control on Left;    PT Home Exercise Plan Needs a refresher    Consulted and Agree with Plan of Care Patient           Patient will benefit from skilled therapeutic intervention in order to improve the following deficits and impairments:  Abnormal gait,Decreased balance,Decreased endurance,Decreased mobility,Difficulty walking,Decreased range of motion,Decreased activity tolerance,Decreased strength,Impaired flexibility,Impaired UE functional use  Visit Diagnosis: Abnormality of gait and mobility  Difficulty in walking, not elsewhere classified  Muscle weakness (generalized)  Unsteadiness on feet     Problem List Patient Active Problem List   Diagnosis Date Noted  . Loose stools   . Visual disturbance   . AKI (acute kidney injury) (HCC)   . Hemiparesis affecting left side as late effect of stroke (HCC)   . Leukocytosis   . Urge incontinence   . Chronic systolic congestive heart  failure (HCC)   . Vascular headache   . Anxiety state   . 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 Acute myopericarditis   . SAH (subarachnoid hemorrhage) (HCC)   . Tobacco abuse   . Chronic pain syndrome   . Prediabetes   . Nonischemic cardiomyopathy (HCC)   . AICD (automatic cardioverter/defibrillator) present   . Acute ischemic right  MCA stroke (HCC) 10/07/2020  . Middle cerebral artery embolism, right 10/07/2020  . Chest pain 11/16/2017   12:22 PM, 01/12/21 Rosamaria Lints, PT, DPT Physical Therapist - [facility]  Outpatient Physical Therapy- Main Campus [phone]     Rosamaria Lints 01/12/2021, 12:20 PM  [facility] MAIN [facility] SERVICES [address] [place], [place], [address] Phone: [phone]   Fax:  [phone]  Name: Rebecca Singh MRN: [id number] Date of Birth: 04-May-1956

## 2021-01-14 ENCOUNTER — Other Ambulatory Visit: Payer: Self-pay

## 2021-01-14 ENCOUNTER — Encounter: Payer: Medicare HMO | Admitting: Occupational Therapy

## 2021-01-14 ENCOUNTER — Ambulatory Visit: Payer: Medicare HMO

## 2021-01-14 ENCOUNTER — Ambulatory Visit (INDEPENDENT_AMBULATORY_CARE_PROVIDER_SITE_OTHER): Payer: Medicare HMO | Admitting: Cardiology

## 2021-01-14 ENCOUNTER — Encounter: Payer: Self-pay | Admitting: Cardiology

## 2021-01-14 VITALS — BP 130/84 | HR 84 | Ht 65.0 in | Wt 207.2 lb

## 2021-01-14 DIAGNOSIS — I480 Paroxysmal atrial fibrillation: Secondary | ICD-10-CM | POA: Diagnosis not present

## 2021-01-14 DIAGNOSIS — I428 Other cardiomyopathies: Secondary | ICD-10-CM | POA: Diagnosis not present

## 2021-01-14 DIAGNOSIS — Z95810 Presence of automatic (implantable) cardiac defibrillator: Secondary | ICD-10-CM

## 2021-01-14 LAB — PACEMAKER DEVICE OBSERVATION

## 2021-01-14 MED ORDER — APIXABAN 5 MG PO TABS: 5.0000 mg | ORAL_TABLET | Freq: Two times a day (BID) | ORAL | 3 refills | Status: DC

## 2021-01-14 NOTE — Progress Notes (Signed)
Electrophysiology Office Note:    Date:  01/14/2021   ID:  Rebecca Singh, DOB 12-Oct-1955, MRN [id number]  PCP:  [facility] Cardiologist:  Debbe Odea, MD  [facility] Electrophysiologist:  Lanier Prude, MD   Referring MD: Debbe Odea, MD   Chief Complaint: Nonischemic cardiomyopathy, BiV ICD in situ  History of Present Illness:    Rebecca Singh is a 65 y.o. female who presents for an evaluation of nonischemic cardiomyopathy at the request of Dr. Azucena Cecil. Their medical history includes BiV ICD, MI, stroke, COPD.  The patient was last seen by Dr. Azucena Cecil November 17, 2020 to establish care.  She was previously followed by [facility].  Her stroke was in January 2022.  She presented to [facility] with severe headache and left-sided weakness.  She underwent an emergent thrombectomy.  Past Medical History:  Diagnosis Date  . AICD (automatic cardioverter/defibrillator) present   . COPD (chronic obstructive pulmonary disease) (HCC)   . Hypothyroidism   . Myocardial infarction (HCC)   . Pacemaker/defibrillator   . Stroke [facility])     Past Surgical History:  Procedure Laterality Date  . ABDOMINAL HYSTERECTOMY    . CHOLECYSTECTOMY    . INSERT / REPLACE / REMOVE PACEMAKER    . IR CT HEAD LTD  10/07/2020  . IR PERCUTANEOUS ART THROMBECTOMY/INFUSION INTRACRANIAL INC DIAG ANGIO  10/07/2020  . RADIOLOGY WITH ANESTHESIA N/A 10/07/2020   Procedure: IR WITH ANESTHESIA - CODE STROKE;  Surgeon: Radiologist, Medication, MD;  Location: [facility] OR;  Service: Radiology;  Laterality: N/A;  . THYROIDECTOMY    . TONSILLECTOMY      Current Medications: Current Meds  Medication Sig  . acetaminophen (TYLENOL) 325 MG tablet Take 2 tablets (650 mg total) by mouth every 4 (four) hours as needed for mild pain (or temp > 37.5 C (99.5 F)).  Marland Kitchen amitriptyline (ELAVIL) 50 MG tablet Take 1 tablet (50 mg total) by mouth at bedtime.  Marland Kitchen apixaban (ELIQUIS) 5 MG TABS tablet Take  1 tablet (5 mg total) by mouth 2 (two) times daily.  Marland Kitchen atorvastatin (LIPITOR) 40 MG tablet Take 1 tablet by mouth daily.  Marland Kitchen levothyroxine (SYNTHROID) 125 MCG tablet Take 1 tablet by mouth daily.  Marland Kitchen loperamide (IMODIUM) 2 MG capsule Take 1 capsule (2 mg total) by mouth daily.  Marland Kitchen losartan (COZAAR) 25 MG tablet Take 1 tablet by mouth daily.  . metoprolol succinate (TOPROL XL) 25 MG 24 hr tablet Take 1 tablet (25 mg total) by mouth daily.  . nicotine (NICODERM CQ - DOSED IN MG/24 HOURS) 14 mg/24hr patch 14 mg patch daily x3 weeks then 7 mg patch daily x3 weeks and stop  . omeprazole (PRILOSEC) 40 MG capsule Take 1 capsule by mouth daily.  . Rimegepant Sulfate (NURTEC) 75 MG TBDP Take 75 mg by mouth daily.  Marland Kitchen topiramate (TOPAMAX) 50 MG tablet Take 1 tablet (50 mg total) by mouth 2 (two) times daily.  . [DISCONTINUED] aspirin EC 81 MG EC tablet Take 1 tablet (81 mg total) by mouth daily. Swallow whole.     Allergies:   Codeine, Aspirin, Latex, and Other   Social History   Socioeconomic History  . Marital status: Widowed    Spouse name: Not on file  . Number of children: Not on file  . Years of education: Not on file  . Highest education level: Not on file  Occupational History  . Not on file  Tobacco Use  . Smoking status: Former  Smoker    Packs/day: 1.00    Years: 15.00    Pack years: 15.00    Types: Cigarettes  . Smokeless tobacco: Never Used  Vaping Use  . Vaping Use: Never used  Substance and Sexual Activity  . Alcohol use: No  . Drug use: No  . Sexual activity: Not Currently  Other Topics Concern  . Not on file  Social History Narrative  . Not on file   Social Determinants of Health   Financial Resource Strain: Not on file  Food Insecurity: Not on file  Transportation Needs: Not on file  Physical Activity: Not on file  Stress: Not on file  Social Connections: Not on file     Family History: The patient's family history includes Diabetes in her brother, brother,  and mother; Heart disease (age of onset: 21) in her mother; Macular degeneration in her father.  ROS:   Please see the history of present illness.    All other systems reviewed and are negative.  EKGs/Labs/Other Studies Reviewed:    The following studies were reviewed today: Records  October 09, 2020 echo personally reviewed Left ventricular function moderately decreased, 30% Right ventricular function mildly decreased, Mildly dilated left atrium No significant valvular abnormalities  11/16/2017 CXR 2v   EKG:  The ekg ordered today demonstrates atrial sensing, ventricularly pacing rhythm, BiV pacing.   Jan 14, 2021 in clinic device interrogation personally reviewed [employer] CRT-D originally placed November 23, 2013.  Generator replaced September 25, 2020 Underlying rhythm sinus rhythm at 83 bpm Atrial pacing threshold slightly elevated at 1.9 V at 1.5 ms.  RV and LV lead parameters stable. DDD 50-1 30 LV pacing vector is LV ring to can Patient has had atrial high rate episodes longest lasting.  Review of EGM is suggestive atrial fibrillation.  These appear to be new since the last reset on September 25, 2020.  Since January 13, there have been 2 episodes between 1 minute and 1 hour and 38 episodes less than 1 minute     Recent Labs: 10/08/2020: TSH 2.368 10/14/2020: ALT 20 10/21/2020: BUN 27; Creatinine, Ser 0.94; Potassium 3.5; Sodium 139 10/22/2020: Hemoglobin 15.0; Platelets 275  Recent Lipid Panel    Component Value Date/Time   CHOL 124 10/08/2020 0443   TRIG 52 10/08/2020 0443   HDL 49 10/08/2020 0443   CHOLHDL 2.5 10/08/2020 0443   VLDL 10 10/08/2020 0443   LDLCALC 65 10/08/2020 0443    Physical Exam:    VS:  BP 130/84   Pulse 84   Ht 5\' 5"  (1.651 m)   Wt 207 lb 3.2 oz (94 kg)   SpO2 96%   BMI 34.48 kg/m     Wt Readings from Last 3 Encounters:  01/14/21 207 lb 3.2 oz (94 kg)  11/25/20 209 lb (94.8 kg)  11/17/20 200 lb (90.7 kg)      GEN:  Well  nourished, well developed in no acute distress HEENT: Normal NECK: No JVD; No carotid bruits LYMPHATICS: No lymphadenopathy CARDIAC: RRR, no murmurs, rubs, gallops.  Pacemaker pocket well-healed. RESPIRATORY:  Clear to auscultation without rales, wheezing or rhonchi  ABDOMEN: Soft, non-tender, non-distended MUSCULOSKELETAL:  No edema; No deformity  SKIN: Warm and dry NEUROLOGIC:  Alert and oriented x 3 PSYCHIATRIC:  Normal affect   ASSESSMENT:    1. S/P implantation of automatic cardioverter/defibrillator (AICD)   2. NICM (nonischemic cardiomyopathy) (HCC)   3. Paroxysmal atrial fibrillation (HCC)    PLAN:    In  order of problems listed above:  1. Nonischemic cardiomyopathy post CRT-D implant Device functioning well.  Atrial pacing threshold slightly elevated but stable.  No ICD therapies delivered. Continue losartan, metoprolol.   2.  Paroxysmal A. fib New diagnosis.  Brief episodes with only 2 episodes lasting between 1 hour but given the patient's history of significant and disabling stroke, favor conservative management with anticoagulation.  I recommended that we start Eliquis 5 mg twice daily and she is agreeable.  We will stop her aspirin in place of the Eliquis.  I will plan to see her back in 3 months to see how she is doing on this therapy.  We will also review her atrial fibrillation burden at that appointment.  We will establish her in our device clinic for monitoring.  She will work on getting records from [facility]   Total time spent with patient today 45 minutes. This includes reviewing records, evaluating the patient and coordinating care.  Medication Adjustments/Labs and Tests Ordered: Current medicines are reviewed at length with the patient today.  Concerns regarding medicines are outlined above.  Orders Placed This Encounter  Procedures  . EKG 12-Lead   Meds ordered this encounter  Medications  . apixaban (ELIQUIS) 5 MG TABS tablet    Sig: Take 1 tablet (5 mg  total) by mouth 2 (two) times daily.    Dispense:  60 tablet    Refill:  3     Signed, Sheria Lang T. Lalla Brothers, MD, [facility], [facility] 01/14/2021 4:08 PM    Electrophysiology [facility]

## 2021-01-14 NOTE — Patient Instructions (Addendum)
Medication Instructions:  Your physician has recommended you make the following change in your medication:  1. STOP Aspirin 2. START Eliquis 5 mg twice a day  *If you need a refill on your cardiac medications before your next appointment, please call your pharmacy*   Lab Work: None ordered   Testing/Procedures: None ordered   Follow-Up: At [facility], you and your health needs are our priority.  As part of our continuing mission to provide you with exceptional heart care, we have created designated Provider Care Teams.  These Care Teams include your primary Cardiologist (physician) and Advanced Practice Providers (APPs -  Physician Assistants and Nurse Practitioners) who all work together to provide you with the care you need, when you need it.  Remote monitoring is used to monitor your Pacemaker or ICD from home. This monitoring reduces the number of office visits required to check your device to one time per year. It allows [place] to keep an eye on the functioning of your device to ensure it is working properly. You are scheduled for a device check from home on 04/15/2021. You may send your transmission at any time that day. If you have a wireless device, the transmission will be sent automatically. After your physician reviews your transmission, you will receive a postcard with your next transmission date.  Your next appointment:   3 month(s)  The format for your next appointment:   In Person  Provider:   Steffanie Dunn, MD   Thank you for choosing [facility]!!    Other Instructions   Apixaban Tablets What is this medicine? APIXABAN (a PIX a ban) is an anticoagulant (blood thinner). It is used to lower the chance of stroke in people with a medical condition called atrial fibrillation. It is also used to treat or prevent blood clots in the lungs or in the veins. This medicine may be used for other purposes; ask your health care provider or pharmacist if you have  questions. COMMON BRAND NAME(S): Eliquis What should I tell my health care provider before I take this medicine? They need to know if you have any of these conditions:  antiphospholipid antibody syndrome  bleeding disorders  bleeding in the brain  blood in your stools (black or tarry stools) or if you have blood in your vomit  history of blood clots  history of stomach bleeding  kidney disease  liver disease  mechanical heart valve  an unusual or allergic reaction to apixaban, other medicines, foods, dyes, or preservatives  pregnant or trying to get pregnant  breast-feeding How should I use this medicine? Take this medicine by mouth with a glass of water. Follow the directions on the prescription label. You can take it with or without food. If it upsets your stomach, take it with food. Take your medicine at regular intervals. Do not take it more often than directed. Do not stop taking except on your doctor's advice. Stopping this medicine may increase your risk of a blood clot. Be sure to refill your prescription before you run out of medicine. Talk to your pediatrician regarding the use of this medicine in children. Special care may be needed. Overdosage: If you think you have taken too much of this medicine contact a poison control center or emergency room at once. NOTE: This medicine is only for you. Do not share this medicine with others. What if I miss a dose? If you miss a dose, take it as soon as you can. If it is almost  time for your next dose, take only that dose. Do not take double or extra doses. What may interact with this medicine? This medicine may interact with the following:  aspirin and aspirin-like medicines  certain medicines for fungal infections like ketoconazole and itraconazole  certain medicines for seizures like carbamazepine and phenytoin  certain medicines that treat or prevent blood clots like warfarin, enoxaparin, and  dalteparin  clarithromycin  NSAIDs, medicines for pain and inflammation, like ibuprofen or naproxen  rifampin  ritonavir  St. John's wort This list may not describe all possible interactions. Give your health care provider a list of all the medicines, herbs, non-prescription drugs, or dietary supplements you use. Also tell them if you smoke, drink alcohol, or use illegal drugs. Some items may interact with your medicine. What should I watch for while using this medicine? Visit your healthcare professional for regular checks on your progress. You may need blood work done while you are taking this medicine. Your condition will be monitored carefully while you are receiving this medicine. It is important not to miss any appointments. Avoid sports and activities that might cause injury while you are using this medicine. Severe falls or injuries can cause unseen bleeding. Be careful when using sharp tools or knives. Consider using an Neurosurgeon. Take special care brushing or flossing your teeth. Report any injuries, bruising, or red spots on the skin to your healthcare professional. If you are going to need surgery or other procedure, tell your healthcare professional that you are taking this medicine. Wear a medical ID bracelet or chain. Carry a card that describes your disease and details of your medicine and dosage times. What side effects may I notice from receiving this medicine? Side effects that you should report to your doctor or health care professional as soon as possible:  allergic reactions like skin rash, itching or hives, swelling of the face, lips, or tongue  signs and symptoms of bleeding such as bloody or black, tarry stools; red or dark-brown urine; spitting up blood or brown material that looks like coffee grounds; red spots on the skin; unusual bruising or bleeding from the eye, gums, or nose  signs and symptoms of a blood clot such as chest pain; shortness of breath; pain,  swelling, or warmth in the leg  signs and symptoms of a stroke such as changes in vision; confusion; trouble speaking or understanding; severe headaches; sudden numbness or weakness of the face, arm or leg; trouble walking; dizziness; loss of coordination This list may not describe all possible side effects. Call your doctor for medical advice about side effects. You may report side effects to FDA at 1-800-FDA-1088. Where should I keep my medicine? Keep out of the reach of children. Store at room temperature between 20 and 25 degrees C (68 and 77 degrees F). Throw away any unused medicine after the expiration date. NOTE: This sheet is a summary. It may not cover all possible information. If you have questions about this medicine, talk to your doctor, pharmacist, or health care provider.  2021 Elsevier/Gold Standard (2020-07-09 16:54:11)

## 2021-01-20 ENCOUNTER — Ambulatory Visit: Payer: Medicare HMO | Admitting: Physical Therapy

## 2021-01-20 ENCOUNTER — Encounter: Payer: Medicare HMO | Admitting: Occupational Therapy

## 2021-01-22 ENCOUNTER — Ambulatory Visit: Payer: Medicare HMO

## 2021-01-27 ENCOUNTER — Encounter: Payer: Medicare HMO | Admitting: Occupational Therapy

## 2021-01-27 ENCOUNTER — Ambulatory Visit: Payer: Medicare HMO

## 2021-01-29 ENCOUNTER — Encounter: Payer: Medicare HMO | Admitting: Occupational Therapy

## 2021-01-29 ENCOUNTER — Ambulatory Visit: Payer: Medicare HMO

## 2021-02-02 ENCOUNTER — Other Ambulatory Visit: Payer: Self-pay

## 2021-02-02 ENCOUNTER — Ambulatory Visit: Payer: Medicare HMO

## 2021-02-02 DIAGNOSIS — M6281 Muscle weakness (generalized): Secondary | ICD-10-CM

## 2021-02-02 DIAGNOSIS — R269 Unspecified abnormalities of gait and mobility: Secondary | ICD-10-CM | POA: Diagnosis not present

## 2021-02-02 DIAGNOSIS — R278 Other lack of coordination: Secondary | ICD-10-CM

## 2021-02-02 DIAGNOSIS — R262 Difficulty in walking, not elsewhere classified: Secondary | ICD-10-CM

## 2021-02-02 NOTE — Therapy (Signed)
[facility] MAIN [facility] SERVICES [address] [place], [place], [address] Phone: [phone]   Fax:  [phone]  Physical Therapy Treatment  Patient Details  Name: Rebecca Singh MRN: [id number] Date of Birth: 10/19/1955 Referring Provider (PT): Mariam Dollar PA-C   Encounter Date: 02/02/2021   PT End of Session - 02/02/21 0937    Visit Number 11    Number of Visits 25    Date for PT Re-Evaluation 02/04/21    Authorization Type [employer] H[employer]; 2* Medicaid    Authorization Time Period 11/12/20-02/04/21    PT Start Time 0932    PT Stop Time 1015    PT Time Calculation (min) 43 min    Equipment Utilized During Treatment Gait belt    Activity Tolerance Patient tolerated treatment well;No increased pain    Behavior During Therapy WFL for tasks assessed/performed           Past Medical History:  Diagnosis Date  . AICD (automatic cardioverter/defibrillator) present   . COPD (chronic obstructive pulmonary disease) (HCC)   . Hypothyroidism   . Myocardial infarction (HCC)   . Pacemaker/defibrillator   . Stroke [facility])     Past Surgical History:  Procedure Laterality Date  . ABDOMINAL HYSTERECTOMY    . CHOLECYSTECTOMY    . INSERT / REPLACE / REMOVE PACEMAKER    . IR CT HEAD LTD  10/07/2020  . IR PERCUTANEOUS ART THROMBECTOMY/INFUSION INTRACRANIAL INC DIAG ANGIO  10/07/2020  . RADIOLOGY WITH ANESTHESIA N/A 10/07/2020   Procedure: IR WITH ANESTHESIA - CODE STROKE;  Surgeon: Radiologist, Medication, MD;  Location: [facility] OR;  Service: Radiology;  Laterality: N/A;  . THYROIDECTOMY    . TONSILLECTOMY      There were no vitals filed for this visit.   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 unrestricted    How long can you stand comfortably? 5 minutes now; previously 2-3 mins    How long can you walk comfortably? 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 No/denies               Interventions:   Neuromuscular re-ed:  Rockerboard-  Ant/post- Initially just static stand but after patient able to stand well then transitioned to ant/post weight shift x 25 reps each direction with hands hovering over support bar but instruction to not touch unless necessary. Patient initially challenged with static hold (placing more weight posterior on heels yet did improve with VC and practice) and able to demo improving wt. Shift also with VC and practice.   Lateral- positioned rockerboard- Same as above - 25 reps with slightly more difficulty weight shifting to left side- She did improve with VC today.    Blue airex pad - Static stand with feet in varying positioned and Eyes opened/closed and head turning.  1) feet apart- 20 sec with EO (no instability), EC (mild increased a/p sway), EO with head turn (no instability), EC with head turn- Patient stopped stating some dizziness.  2) feet narrowed -  20 sec x 3 trials with EO (mild initial instability), EC (marked increased A/P sway yet no loss of balance - occasional light UE touch), EO with head turn (unsteady- reaching for bar after 3-4 head turns. 3) feet staggered - 20 sec x 3 trials- EO (increased A/P sway yet no reaching for UE support), EC (increased unsteadiness with reaching for bar support)   Matrix cable system to work both UE strength and challenge dynamic standing balance: 1) Bicep curl with 2.5 lb each arm- (difficulty grip on left) x 10 reps with feet staggered- no loss of balance (Difficulty due to Left UE weakness)  2) Tricep  press down with 2.5 lb. Each arm- 10 reps with feet staggered 3) Scap retraction- 7.5 lb. With feet staggered x 12 reps - mild initial unsteadiness yet improved with practice.- no loss of balance.   Clinical Impression: Patient challenged today with balance activities involving eyes closed and head turns with increased unsteadiness. She performed well with dual tasking with UE strengthening with staggered foot position. Patient able to participate well without any significant loss of balance today. The patient will continue to benefit from additional skilled PT services to improved strength and balance for improved gait abilities and improve quality of life                      PT Education - 02/02/21 1028    Education Details specific exercise cues and balance education    Person(s) Educated Patient    Methods Explanation;Demonstration;Verbal cues    Comprehension Verbalized understanding;Returned demonstration;Verbal cues required;Need further instruction            PT Short Term Goals - 01/12/21 1207      PT SHORT TERM GOAL #1   Title Patient will be independent in home exercise program to improve strength/mobility for better functional independence with ADLs.    Status Achieved    Target Date 02/04/21             PT Long Term Goals - 01/12/21 1200      PT LONG TERM GOAL #1   Title Patient will increase FOTO score to equal to or greater than 62% to demonstrate statistically significant improvement in mobility and quality of life.    Baseline 11/12/20 48%; 01/12/21: 54/100    Time 12    Period Weeks    Status On-going    Target Date 02/04/21      PT LONG TERM GOAL #2   Title Patient (> 9 years old) will complete five times sit to stand test in < 15 seconds indicating an increased LE strength and improved balance.    Baseline 11/12/20 28.82 c 2 hands; 01/12/21: 14 sec    Time 12    Period Weeks    Status On-going    Target Date 02/04/21      PT LONG TERM GOAL  #3   Title Patient will increase 10 meter walk test to >1.64m/s for imporved tolerance and increaed safety in community ambulation.    Baseline 11/12/20  not assessed. 11/24/2020= 0.7 m/s using front wheeled walker; 01/12/21: 0.54m/s and 1.59m/s SPC    Time 12    Period Weeks    Status On-going    Target Date 02/04/21      PT LONG TERM GOAL #4   Title Patient will increase Berg Balance score to at least 45/56  points to demonstrate decreased fall risk during functional activities.    Baseline 11/12/20  not assessed. 11/24/2020- Patient declined; 01/12/21: 45/56    Time 12    Period Weeks    Status On-going    Target Date 02/04/21                 Plan - 02/02/21 4103    Clinical Impression Statement Patient challenged today with balance activities involving eyes closed and head turns with increased unsteadiness. She performed well with dual tasking with UE strengthening with staggered foot position. Patient able to participate well without any significant loss of balance today. The patient will continue to benefit from additional skilled PT services to improved strength and balance for improved gait abilities and improve quality of life    Personal Factors and Comorbidities Comorbidity 3+    Comorbidities Left TKA 10/21, pacemaker, COPD, HTN, prediabetic    Examination-Activity Limitations Bathing;Dressing;Transfers;Lift;Squat;Locomotion Level;Stairs;Carry;Stand    Examination-Participation Restrictions Cleaning;Community Activity;Driving;Meal Prep;Occupation;Shop    Stability/Clinical Decision Making Evolving/Moderate complexity    Rehab Potential Good    PT Frequency 2x / week    PT Duration 12 weeks    PT Treatment/Interventions ADLs/Self Care Home Management;Gait training;Stair training;Functional mobility training;Therapeutic activities;Therapeutic exercise;Balance training;Neuromuscular re-education;Patient/family education;Visual/perceptual remediation/compensation    PT Next Visit  Plan Continue with LE strengthening and balance activities    PT Home Exercise Plan --    Consulted and Agree with Plan of Care Patient           Patient will benefit from skilled therapeutic intervention in order to improve the following deficits and impairments:  Abnormal gait,Decreased balance,Decreased endurance,Decreased mobility,Difficulty walking,Decreased range of motion,Decreased activity tolerance,Decreased strength,Impaired flexibility,Impaired UE functional use  Visit Diagnosis: Abnormality of gait and mobility  Difficulty in walking, not elsewhere classified  Muscle weakness (generalized)  Other lack of coordination     Problem List Patient Active Problem List   Diagnosis Date Noted  . Loose stools   . Visual disturbance   . AKI (acute kidney injury) (HCC)   . Hemiparesis affecting left side as late effect of stroke (HCC)   . Leukocytosis   . Urge incontinence   . Chronic systolic congestive heart failure (HCC)   . Vascular headache   . Anxiety state   . 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 Acute myopericarditis   . SAH (subarachnoid hemorrhage) (HCC)   . Tobacco abuse   . Chronic pain syndrome   . Prediabetes   . Nonischemic cardiomyopathy (HCC)   . AICD (automatic cardioverter/defibrillator) present   . Acute ischemic right MCA stroke (HCC) 10/07/2020  . Middle cerebral artery embolism, right 10/07/2020  . Chest pain 11/16/2017    Lenda Kelp, PT 02/02/2021, 10:50 AM  [place] [facility] MAIN [facility] SERVICES [address] [place], [place], [address] Phone: [phone]   Fax:  [phone]  Name: Rebecca Singh MRN: [id number] Date of Birth: 09/22/1955

## 2021-02-04 ENCOUNTER — Ambulatory Visit: Payer: Medicare HMO

## 2021-02-11 ENCOUNTER — Ambulatory Visit: Payer: Medicare HMO | Admitting: Physical Therapy

## 2021-02-11 ENCOUNTER — Ambulatory Visit: Payer: Medicare HMO | Admitting: Occupational Therapy

## 2021-02-17 ENCOUNTER — Other Ambulatory Visit: Payer: Self-pay

## 2021-02-17 ENCOUNTER — Encounter: Payer: Self-pay | Admitting: Occupational Therapy

## 2021-02-17 ENCOUNTER — Ambulatory Visit: Payer: Medicare HMO | Attending: Physician Assistant | Admitting: Occupational Therapy

## 2021-02-17 ENCOUNTER — Encounter: Payer: Self-pay | Admitting: Physical Therapy

## 2021-02-17 ENCOUNTER — Ambulatory Visit: Payer: Medicare HMO | Admitting: Physical Therapy

## 2021-02-17 DIAGNOSIS — R278 Other lack of coordination: Secondary | ICD-10-CM | POA: Insufficient documentation

## 2021-02-17 DIAGNOSIS — R262 Difficulty in walking, not elsewhere classified: Secondary | ICD-10-CM | POA: Insufficient documentation

## 2021-02-17 DIAGNOSIS — M6281 Muscle weakness (generalized): Secondary | ICD-10-CM

## 2021-02-17 DIAGNOSIS — R269 Unspecified abnormalities of gait and mobility: Secondary | ICD-10-CM | POA: Diagnosis present

## 2021-02-17 DIAGNOSIS — R2681 Unsteadiness on feet: Secondary | ICD-10-CM | POA: Diagnosis present

## 2021-02-17 NOTE — Therapy (Signed)
[place] [facility] MAIN [facility] SERVICES [address] [place], [place], [address] Phone: [phone]   Fax:  [phone]  Occupational Therapy Treatment/recertification Note  Patient Details  Name: Rebecca Singh MRN: [id number] Date of Birth: 09-28-1955 No data recorded  Encounter Date: 02/17/2021   OT End of Session - 02/17/21 0938    Visit Number 6    Number of Visits 24    Date for OT Re-Evaluation 05/12/21    Authorization Time Period Progress report period starting 11/22/2020    OT Start Time 0935    OT Stop Time 1015    OT Time Calculation (min) 40 min    Activity Tolerance Patient tolerated treatment well    Behavior During Therapy [facility] for tasks assessed/performed           Past Medical History:  Diagnosis Date  . AICD (automatic cardioverter/defibrillator) present   . COPD (chronic obstructive pulmonary disease) (HCC)   . Hypothyroidism   . Myocardial infarction (HCC)   . Pacemaker/defibrillator   . Stroke [facility])     Past Surgical History:  Procedure Laterality Date  . ABDOMINAL HYSTERECTOMY    . CHOLECYSTECTOMY    . INSERT / REPLACE / REMOVE PACEMAKER    . IR CT HEAD LTD  10/07/2020  . IR PERCUTANEOUS ART THROMBECTOMY/INFUSION INTRACRANIAL INC DIAG ANGIO  10/07/2020  . RADIOLOGY WITH ANESTHESIA N/A 10/07/2020   Procedure: IR WITH ANESTHESIA - CODE STROKE;  Surgeon: Radiologist, Medication, MD;  Location: [facility] OR;  Service: Radiology;  Laterality: N/A;  . THYROIDECTOMY    . TONSILLECTOMY      There were no vitals filed for this visit.   Subjective Assessment - 02/17/21 0938    Subjective  Pt. reports that her head feels better.    Pertinent History CVA on 10-07-2020 and discharged on 10-24-2020. 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 No/denies              [facility] OT  Assessment - 02/17/21 0001      Coordination   Left 9 Hole Peg Test 36      Strength   Overall Strength Comments LUE strength 4/5      Hand Function   Left Hand Grip (lbs) 1    Left Hand Lateral Pinch 2 lbs    Left 3 point pinch 1 lbs          Pt. Has returned to therapy after being out due to having had a respiratory illness. Measurements were obtained, and goals were reviewed with the pt. Pt.has made progress since the last recertification period. Pt. Has made progress with her LUE. Pt. is now using her hand during daily ADL, and IADL tasks. Pt. has improved left UE ROM, strength, and [facility] skills. Pt. continues to present with limited gross grip strength, pinch strength, and FMC skills. Pt. has resumed driving. Pt. Is now able to perform cooking, and meal preparation, home management tasks including laundry, and dishes. Pt. Has started sewing again. Pt. has difficulty cutting meat, and veggies, firmly holding a large spoon while stirring, buckling, and unbuckling a seatbelt, and reaching into her back pocket for her cellphone. Pt. continues to work on improving UE strength, and [facility] skills in order to work towards improving,a nd maximizing overall independence with ADLs, and IADLs.  OT Education - 02/17/21 [phone]    Education Details coordination    Person(s) Educated Patient    Methods Explanation;Demonstration    Comprehension Verbalized understanding;Returned demonstration               OT Long Term Goals - 02/17/21 0952      OT LONG TERM GOAL #1   Title Patient will be modified independent with home exercise program    Baseline 02/17/2021:Pt. has pink theraputty.  Eval: No current program    Time 12    Period Weeks    Status On-going    Target Date 05/12/21      OT LONG TERM GOAL #2   Title Patient will improve grip strength of left hand by 10# to be able to stabilize and cut food with modified independence.    Baseline 02/17/2021: Left grip  1# Eval: unable    Time 12    Period Weeks    Status On-going    Target Date 05/12/21      OT LONG TERM GOAL #3   Title Patient will demonstrate washing dishes in standing for up to 10 mins without rest breaks with modified independence.    Baseline 02/17/2021: Independent. No rest breaks.Eval difficulty with task.    Status Achieved      OT LONG TERM GOAL #4   Title Patient will increase strength in left shoulder by 1 mm grade to place clothing in closet with modified independence.    Baseline 02/17/2021: LUE strength: 4/5. Eval:  strength 2/5    Time 12    Period Weeks    Status Revised    Target Date 05/12/21      OT LONG TERM GOAL #5   Title Pt.  will improve functional hand strength to open jars and containers with occasional min assist.    Baseline 02/17/2021: Pt continues to have difficulty. Eval: unable to open jars and containers    Time 12    Period Weeks    Status On-going    Target Date 05/12/21      Long Term Additional Goals   Additional Long Term Goals Yes      OT LONG TERM GOAL #6   Title Pt will complete light meal prep with modified independence    Baseline 02/17/2021: Pt. is independent. Eval: unable    Time 12    Period Weeks    Status Achieved    Target Date --      OT LONG TERM GOAL #7   Title Patient will decrease 9 hole peg test  by 15 secs to manipulate small objects with modified independence and with good speed.    Baseline 02/17/2021: 36 sec. Eval: 50secs    Time 8    Period Weeks    Status On-going    Target Date 05/12/21      OT LONG TERM GOAL #8   Title Pt will improve FOTO score to 57 or above to show a clinically signifcant change function to complete daily tasks with greater independence.    Baseline 02/17/2021: 61; Eval: 44    Time 12    Period Weeks    Status On-going    Target Date 05/12/21      OT LONG TERM GOAL  #9   TITLE Pt. will independently, and firmly hold a large utensil while stirring.    Baseline 02/17/2021: Pt has  difficulty firmly holding a large utensil while stirring.    Time 12  Period Weeks    Status New    Target Date 05/12/21      OT LONG TERM GOAL  #10   TITLE Pt. will improve left pinch strength to be able to safely, and efficiently cut meat and veggies.    Baseline 02/17/2021: Pt. has difficulty using utensils to cut meat, and veggies.    Time 12    Period Weeks    Status New    Target Date 05/12/21      OT LONG TERM GOAL  #11   TITLE Pt. will use her left hand to independently buckle, and unbuckle her seatbelt.    Baseline 02/17/2021: Pt. is unable to buckle/unbuckle her seatbelt    Time 12    Period Weeks    Status New    Target Date 05/12/21      OT LONG TERM GOAL  #12   TITLE Pt. will independently use her left hand to reach into her back pocket to retrieve her phone.    Baseline 02/17/2021: Pt. has difficulty reaching to her back pocket with her left hand for her cellphoone.    Time 12    Period Weeks    Status New    Target Date 05/12/21                 Plan - 02/17/21 0939    Clinical Impression Statement Pt. Has returned to therapy after being out due to having had a respiratory illness. Measurements were obtained, and goals were reviewed with the pt. Pt.has made progress since the last recertification period. Pt. Has made progress with her LUE. Pt. is now using her hand during daily ADL, and IADL tasks. Pt. has improved left UE ROM, strength, and [facility] skills. Pt. continues to present with limited gross grip strength, pinch strength, and FMC skills. Pt. has resumed driving. Pt. Is now able to perform cooking, and meal preparation, home management tasks including laundry, and dishes. Pt. Has started sewing again. Pt. has difficulty cutting meat, and veggies, firmly holding a large spoon while stirring, buckling, and unbuckling a seatbelt, and reaching into her back pocket for her cellphone. Pt. continues to work on improving UE strength, and [facility] skills in order to work  towards improving,a nd maximizing overall independence with ADLs, and IADLs.   OT Occupational Profile and History Detailed Assessment- Review of Records and additional review of physical, cognitive, psychosocial history related to current functional performance    Occupational performance deficits (Please refer to evaluation for details): ADL's;IADL's;Work;Leisure    Body Structure / Function / Physical Skills ADL;Dexterity;Flexibility;ROM;Strength;Balance;Coordination;FMC;IADL;Endurance;Gait;Pain;UE functional use;Decreased knowledge of use of DME;GMC;Mobility    Cognitive Skills Attention;Memory;Safety Awareness    Psychosocial Skills Environmental  Adaptations;Habits;Routines and Behaviors    Rehab Potential Good    Clinical Decision Making Several treatment options, min-mod task modification necessary    Comorbidities Affecting Occupational Performance: May have comorbidities impacting occupational performance    Modification or Assistance to Complete Evaluation  No modification of tasks or assist necessary to complete eval    OT Frequency 2x / week    OT Duration 12 weeks    OT Treatment/Interventions Cryotherapy;Self-care/ADL training;Therapeutic exercise;DME and/or AE instruction;Functional Mobility Training;Cognitive remediation/compensation;Neuromuscular education;Manual Therapy;Moist Heat;Contrast Bath;Energy conservation;Therapeutic activities;Passive range of motion;Patient/family education    Consulted and Agree with Plan of Care Patient;Family member/caregiver           Patient will benefit from skilled therapeutic intervention in order to improve the following deficits and impairments:   Body Structure /  Function / Physical Skills: ADL,Dexterity,Flexibility,ROM,Strength,Balance,Coordination,FMC,IADL,Endurance,Gait,Pain,UE functional use,Decreased knowledge of use of DME,GMC,Mobility Cognitive Skills: Attention,Memory,Safety Awareness Psychosocial Skills: Environmental   Adaptations,Habits,Routines and Behaviors   Visit Diagnosis: Muscle weakness (generalized)  Other lack of coordination    Problem List Patient Active Problem List   Diagnosis Date Noted  . Loose stools   . Visual disturbance   . AKI (acute kidney injury) (HCC)   . Hemiparesis affecting left side as late effect of stroke (HCC)   . Leukocytosis   . Urge incontinence   . Chronic systolic congestive heart failure (HCC)   . Vascular headache   . Anxiety state   . 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 Acute myopericarditis   . SAH (subarachnoid hemorrhage) (HCC)   . Tobacco abuse   . Chronic pain syndrome   . Prediabetes   . Nonischemic cardiomyopathy (HCC)   . AICD (automatic cardioverter/defibrillator) present   . Acute ischemic right MCA stroke (HCC) 10/07/2020  . Middle cerebral artery embolism, right 10/07/2020  . Chest pain 11/16/2017    Olegario Messier, MS, OTR/L 02/17/2021, 4:58 PM  [place] MAIN [facility] SERVICES [address] [place], [place], [address] Phone: [phone]   Fax:  [phone]  Name: Rebecca Singh MRN: [id number] Date of Birth: [birth year]

## 2021-02-17 NOTE — Therapy (Signed)
[place] MAIN [facility] SERVICES [address] [place], [place], [address] Phone: [phone]   Fax:  [phone]  Physical Therapy Treatment/Discharge Summary Patient Details  Name: Rebecca Singh MRN: [id number] Date of Birth: 12-25-1955 Referring Provider (PT): Lauraine Rinne PA-C   Encounter Date: 02/17/2021   PT End of Session - 02/17/21 1019    Visit Number 12    Number of Visits 25    Date for PT Re-Evaluation 02/17/21    Authorization Type [employer] HMO; 2* Medicaid    Authorization Time Period 16 visits from 02/11/21-04/12/21    Authorization - Visit Number 1    Authorization - Number of Visits 16    PT Start Time 1017    PT Stop Time 1050    PT Time Calculation (min) 33 min    Equipment Utilized During Treatment Gait belt    Activity Tolerance Patient tolerated treatment well;No increased pain    Behavior During Therapy WFL for tasks assessed/performed           Past Medical History:  Diagnosis Date  . AICD (automatic cardioverter/defibrillator) present   . COPD (chronic obstructive pulmonary disease) ([place])   . Hypothyroidism   . Myocardial infarction ([place])   . Pacemaker/defibrillator   . Stroke [facility])     Past Surgical History:  Procedure Laterality Date  . ABDOMINAL HYSTERECTOMY    . CHOLECYSTECTOMY    . INSERT / REPLACE / REMOVE PACEMAKER    . IR CT HEAD LTD  10/07/2020  . IR PERCUTANEOUS ART THROMBECTOMY/INFUSION INTRACRANIAL INC DIAG ANGIO  10/07/2020  . RADIOLOGY WITH ANESTHESIA N/A 10/07/2020   Procedure: IR WITH ANESTHESIA - CODE STROKE;  Surgeon: Radiologist, Medication, MD;  Location: [place];  Service: Radiology;  Laterality: N/A;  . THYROIDECTOMY    . TONSILLECTOMY      There were no vitals filed for this visit.   Subjective Assessment - 02/17/21 1018    Subjective Patient reports being sick and that's why she has missed her last few therapy appointments. She reports feeling better now but is still  having a small cough. Denies any new falls. Feels like her balance is a little better.    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 unrestricted    How long can you stand comfortably? 5 minutes now; previously 2-3 mins    How long can you walk comfortably? 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 No/denies    Multiple Pain Sites No              [facility] PT Assessment - 02/17/21 1024      Observation/Other Assessments   Focus on Therapeutic Outcomes (FOTO)  66% ( improved from 54% on 01/12/21)      Transfers   Five time sit to stand comments  12.64 sec without HHA (improved from 14.3 sec on 01/12/21, <15 sec indicates low fall risk)      Standardized Balance Assessment   Standardized Balance Assessment 10 meter walk test    10 Meter Walk 1.16 m/s without AD, community ambulator      Furniture conservator/restorer   Sit to Stand Able to stand  without using hands and stabilize independently    Standing Unsupported Able to stand safely 2 minutes    Sitting with Back Unsupported but Feet Supported on Floor or Stool Able to sit safely and securely 2 minutes    Stand to Sit Sits safely with minimal use of hands    Transfers Able to transfer safely, minor use of hands    Standing Unsupported with Eyes Closed Able to stand 10 seconds safely    Standing Unsupported with Feet Together Able to place feet together independently and stand 1 minute safely    From Standing, Reach Forward with Outstretched Arm Can reach confidently >25 cm (10")    From Standing Position, Pick up Object from Floor Able to pick up shoe safely and easily    From Standing Position, Turn to Look Behind Over each Shoulder  Looks behind from both sides and weight shifts well    Turn 360 Degrees Able to turn 360 degrees safely but slowly    Standing Unsupported, Alternately Place Feet on Step/Stool Able to stand independently and safely and complete 8 steps in 20 seconds    Standing Unsupported, One Foot in Front Able to place foot tandem independently and hold 30 seconds    Standing on One Leg Tries to lift leg/unable to hold 3 seconds but remains standing independently    Total Score 51    Berg comment: slight risk for falls, improved from 45/56 on 01/12/21         TREATMENT: Warm up on Nustep BUE/LE level 3 x4 min (Unbilled)  Instructed patient in outcome measures to address progress towards goals:  See above;  Patient has met all goals at this time with improvement in gait speed, balance and mobility as evidenced by [employer];   She reports being independent in walking without AD with no veering or unsteadiness. She has returned to driving and feels like she is getting close to her baseline. Her biggest complaint is continued LUE weakness; She will continue with OT to work on this issue.  She is appropriate for DC from therapy at this time;                          PT Education - 02/17/21 1019    Education Details progress towards goals, recommendations/POC; HEP    Person(s) Educated Patient    Methods Explanation;Verbal cues    Comprehension Verbalized understanding;Returned demonstration;Verbal cues required;Need further instruction            PT Short Term Goals - 02/17/21 1031      PT SHORT TERM GOAL #1   Title Patient will be independent in home exercise program to improve strength/mobility for better functional independence with ADLs.    Baseline 6/7: does a lot of walking and activity, but not always doing HEP; but is independent in HEP    Status Achieved    Target Date 02/04/21             PT Long Term Goals - 02/17/21 1032      PT LONG TERM GOAL #1   Title  Patient will increase FOTO score to equal to or greater than 62% to demonstrate statistically significant improvement in mobility and quality of life.    Baseline 11/12/20 48%; 01/12/21: 54/100, 6/7: 66%    Time 4    Period Weeks    Status Achieved    Target Date 03/17/21      PT  LONG TERM GOAL #2   Title Patient (> 17 years old) will complete five times sit to stand test in < 15 seconds indicating an increased LE strength and improved balance.    Baseline 11/12/20 28.82 c 2 hands; 01/12/21: 14 sec, 6/7: 12 sec without HHA    Time 4    Period Weeks    Status Achieved    Target Date 03/17/21      PT LONG TERM GOAL #3   Title Patient will increase 10 meter walk test to >1.56ms for imporved tolerance and increaed safety in community ambulation.    Baseline 11/12/20  not assessed. 11/24/2020= 0.7 m/s using front wheeled walker; 01/12/21: 0.812m and 1.058mSPC, 6/7: 1.2 m/s    Time 4    Period Weeks    Status Achieved    Target Date 03/17/21      PT LONG TERM GOAL #4   Title Patient will increase Berg Balance score to at least 45/56  points to demonstrate decreased fall risk during functional activities.    Baseline 11/12/20  not assessed. 11/24/2020- Patient declined; 01/12/21: 45/56, 6/7: 51/56    Time 4    Period Weeks    Status Achieved    Target Date 03/17/21                 Plan - 02/17/21 1051    Clinical Impression Statement Patient has met all goals. She reports being independent in walking without AD . She has returned to driving and is able to go shopping with minimal difficulty. Patient is appropriate for discharge at this time as she has met all goals. Her biggest complaint is LUE weakness; She plans to continue with OT to work on this issue.    Personal Factors and Comorbidities Comorbidity 3+    Comorbidities Left TKA 10/21, pacemaker, COPD, HTN, prediabetic    Examination-Activity Limitations Bathing;Dressing;Transfers;Lift;Squat;Locomotion Level;Stairs;Carry;Stand     Examination-Participation Restrictions Cleaning;Community Activity;Driving;Meal Prep;Occupation;Shop    Stability/Clinical Decision Making Evolving/Moderate complexity    Rehab Potential Good    PT Frequency One time visit    PT Treatment/Interventions ADLs/Self Care Home Management;Gait training;Stair training;Functional mobility training;Therapeutic activities;Therapeutic exercise;Balance training;Neuromuscular re-education;Patient/family education;Visual/perceptual remediation/compensation    PT Next Visit Plan Continue with LE strengthening and balance activities    Consulted and Agree with Plan of Care Patient           Patient will benefit from skilled therapeutic intervention in order to improve the following deficits and impairments:  Abnormal gait,Decreased balance,Decreased endurance,Decreased mobility,Difficulty walking,Decreased range of motion,Decreased activity tolerance,Decreased strength,Impaired flexibility,Impaired UE functional use  Visit Diagnosis: Abnormality of gait and mobility - Plan: PT plan of care cert/re-cert  Difficulty in walking, not elsewhere classified - Plan: PT plan of care cert/re-cert  Muscle weakness (generalized) - Plan: PT plan of care cert/re-cert  Other lack of coordination - Plan: PT plan of care cert/re-cert  Unsteadiness on feet - Plan: PT plan of care cert/re-cert     Problem List Patient Active Problem List   Diagnosis Date Noted  . Loose stools   . Visual disturbance   . AKI (acute kidney injury) ([facility] . Hemiparesis affecting left side as late effect of stroke ([facility] . Leukocytosis   . Urge incontinence   . Chronic systolic congestive heart failure ([facility] . Vascular headache   . Anxiety state   . Right middle cerebral artery stroke ([place]CC[place]1/31/2022  . Acute myopericarditis   . SAH (subarachnoid hemorrhage) (  [place])   . Tobacco abuse   . Chronic pain syndrome   . Prediabetes   . Nonischemic cardiomyopathy ([place])   . AICD  (automatic cardioverter/defibrillator) present   . Acute ischemic right MCA stroke ([place]) 10/07/2020  . Middle cerebral artery embolism, right 10/07/2020  . Chest pain 11/16/2017    Eldon Zietlow PT, DPT 02/17/2021, 10:54 AM  [place] MAIN [facility] SERVICES [address] [place], [place], [address] Phone: [phone]   Fax:  [phone]  Name: Rebecca Singh MRN: [id number] Date of Birth: 08-Sep-1956

## 2021-02-19 ENCOUNTER — Ambulatory Visit (INDEPENDENT_AMBULATORY_CARE_PROVIDER_SITE_OTHER): Payer: Medicare HMO | Admitting: Cardiology

## 2021-02-19 ENCOUNTER — Other Ambulatory Visit: Payer: Self-pay

## 2021-02-19 ENCOUNTER — Encounter: Payer: Self-pay | Admitting: Cardiology

## 2021-02-19 VITALS — BP 110/78 | HR 61 | Ht 66.5 in | Wt 206.1 lb

## 2021-02-19 DIAGNOSIS — I428 Other cardiomyopathies: Secondary | ICD-10-CM | POA: Diagnosis not present

## 2021-02-19 DIAGNOSIS — I639 Cerebral infarction, unspecified: Secondary | ICD-10-CM

## 2021-02-19 DIAGNOSIS — I480 Paroxysmal atrial fibrillation: Secondary | ICD-10-CM

## 2021-02-19 DIAGNOSIS — Z95810 Presence of automatic (implantable) cardiac defibrillator: Secondary | ICD-10-CM | POA: Diagnosis not present

## 2021-02-19 MED ORDER — SPIRONOLACTONE 25 MG PO TABS: 12.5000 mg | ORAL_TABLET | Freq: Every day | ORAL | 3 refills | Status: DC

## 2021-02-19 NOTE — Progress Notes (Signed)
Cardiology Office Note:    Date:  02/19/2021   ID:  Rebecca Singh, DOB 07-04-1956, MRN [id number]  PCP:  [facility]   [facility]  Cardiologist:  Debbe Odea, MD  Advanced Practice Provider:  No care team member to display Electrophysiologist:  Lanier Prude, MD       Referring MD: [facility]   Chief Complaint  Patient presents with   Other    3 month f/u no complaints today. Meds reviewed verbally with pt.    History of Present Illness:    Rebecca Singh is a 65 y.o. female with a hx of NICM( last EF30-35%) s/p Bi-V ICD (gen change 09/25/2020 [employer]), COPD, CVA, former smoker x 7yrs who presents for follow-up.  previously seen to establish care due to history of CHF and stroke.  Previous care was at [facility], lives in [place], wants to establish care in the area.  Establish care with device clinic, ICD interrogated showing paroxysmal atrial fibrillation.  Was started on Eliquis.  Denies chest pain, states having some shortness of breath when she overexerts herself.  Denies edema.  Tolerating Eliquis without any bleeding issues.  Takes Toprol-XL and losartan as prescribed.    Prior notes Echocardiogram 10/09/2020 EF 30 to 35%.   Lexiscan Myoview 11/2017 no significant ischemia.   She has residual left-sided weakness from stroke.   Patient presented to the ED at [facility] 10/06/2020 due to severe headache, left-sided weakness.  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  Underwent emergent thrombectomy.  [facility] records: Cath / PCI  01/16/07 [facility]): reportedly minimal CAD. EF 55%  Non-Invasive Evaluations  07/08/08 adenosine SPECT: No evidence of significant ischemia or scar. EF 45-50%  10/04/13 TTE: Mild LVH, EF 30-35%. Normal RV  12/05/14 TTE: Mod LVH, EF 65-70%. Normal RV    Past Medical History:  Diagnosis Date   AICD (automatic cardioverter/defibrillator) present     COPD (chronic obstructive pulmonary disease) (HCC)    Hypothyroidism    Myocardial infarction (HCC)    Pacemaker/defibrillator    Stroke [facility])     Past Surgical History:  Procedure Laterality Date   ABDOMINAL HYSTERECTOMY     CHOLECYSTECTOMY     INSERT / REPLACE / REMOVE PACEMAKER     IR CT HEAD LTD  10/07/2020   IR PERCUTANEOUS ART THROMBECTOMY/INFUSION INTRACRANIAL INC DIAG ANGIO  10/07/2020   RADIOLOGY WITH ANESTHESIA N/A 10/07/2020   Procedure: IR WITH ANESTHESIA - CODE STROKE;  Surgeon: Radiologist, Medication, MD;  [facility] OR;  Service: Radiology;  Laterality: N/A;   THYROIDECTOMY     TONSILLECTOMY      Current Medications: Current Meds  Medication Sig   acetaminophen (TYLENOL) 325 MG tablet Take 2 tablets (650 mg total) by mouth every 4 (four) hours as needed for mild pain (or temp > 37.5 C (99.5 F)).   amitriptyline (ELAVIL) 50 MG tablet Take 1 tablet (50 mg total) by mouth at bedtime.   apixaban (ELIQUIS) 5 MG TABS tablet Take 1 tablet (5 mg total) by mouth 2 (two) times daily.   atorvastatin (LIPITOR) 40 MG tablet Take 1 tablet by mouth daily.   levothyroxine (SYNTHROID) 125 MCG tablet Take 1 tablet by mouth daily.   loperamide (IMODIUM) 2 MG capsule Take 1 capsule (2 mg total) by mouth daily.   losartan (COZAAR) 25 MG tablet Take 1 tablet by mouth daily.   metoprolol succinate (TOPROL XL) 25 MG  24 hr tablet Take 1 tablet (25 mg total) by mouth daily.   omeprazole (PRILOSEC) 40 MG capsule Take 1 capsule by mouth daily.   Rimegepant Sulfate (NURTEC) 75 MG TBDP Take 75 mg by mouth daily.   spironolactone (ALDACTONE) 25 MG tablet Take 0.5 tablets (12.5 mg total) by mouth daily.   topiramate (TOPAMAX) 50 MG tablet Take 1 tablet (50 mg total) by mouth 2 (two) times daily.     Allergies:   Codeine, Aspirin, Latex, and Other   Social History   Socioeconomic History   Marital status: Widowed    Spouse name: Not on file   Number of children: Not on file   Years of  education: Not on file   Highest education level: Not on file  Occupational History   Not on file  Tobacco Use   Smoking status: Former    Packs/day: 1.00    Years: 15.00    Pack years: 15.00    Types: Cigarettes   Smokeless tobacco: Never  Vaping Use   Vaping Use: Never used  Substance and Sexual Activity   Alcohol use: No   Drug use: No   Sexual activity: Not Currently  Other Topics Concern   Not on file  Social History Narrative   Not on file   Social Determinants of Health   Financial Resource Strain: Not on file  Food Insecurity: Not on file  Transportation Needs: Not on file  Physical Activity: Not on file  Stress: Not on file  Social Connections: Not on file     Family History: The patient's family history includes Diabetes in her brother, brother, and mother; Heart disease (age of onset: 26) in her mother; Macular degeneration in her father.  ROS:   Please see the history of present illness.     All other systems reviewed and are negative.  EKGs/Labs/Other Studies Reviewed:    The following studies were reviewed today:   EKG:  EKG is  ordered today.  The ekg ordered today demonstrates a sensed V paced rhythm  Recent Labs: 10/08/2020: TSH 2.368 10/14/2020: ALT 20 10/21/2020: BUN 27; Creatinine, Ser 0.94; Potassium 3.5; Sodium 139 10/22/2020: Hemoglobin 15.0; Platelets 275  Recent Lipid Panel    Component Value Date/Time   CHOL 124 10/08/2020 0443   TRIG 52 10/08/2020 0443   HDL 49 10/08/2020 0443   CHOLHDL 2.5 10/08/2020 0443   VLDL 10 10/08/2020 0443   LDLCALC 65 10/08/2020 0443     Risk Assessment/Calculations:      Physical Exam:    VS:  BP 110/78 (BP Location: Left Arm, Patient Position: Sitting, Cuff Size: Normal)   Pulse 61   Ht 5' 6.5" (1.689 m)   Wt 206 lb 2 oz (93.5 kg)   SpO2 98%   BMI 32.77 kg/m     Wt Readings from Last 3 Encounters:  02/19/21 206 lb 2 oz (93.5 kg)  01/14/21 207 lb 3.2 oz (94 kg)  11/25/20 209 lb (94.8 kg)      GEN:  Well nourished, well developed in no acute distress HEENT: Normal NECK: No JVD; No carotid bruits LYMPHATICS: No lymphadenopathy CARDIAC: RRR, no murmurs, rubs, gallops RESPIRATORY: Decreased breath sounds at bases ABDOMEN: Soft, non-tender, non-distended MUSCULOSKELETAL:  No edema; No deformity  SKIN: Warm and dry NEUROLOGIC:  Alert and oriented x 3 PSYCHIATRIC:  Normal affect   ASSESSMENT:    1. NICM (nonischemic cardiomyopathy) (HCC)   2. S/P implantation of automatic cardioverter/defibrillator (AICD)   3.  Cerebrovascular accident (CVA), unspecified mechanism (HCC)   4. Paroxysmal atrial fibrillation (HCC)     PLAN:    In order of problems listed above:  Nonischemic cardiomyopathy, EF 30 to 35%.  Describes NYHA class II symptoms.  contToprol-XL,  losartan, start Aldactone 12.5 mg daily.  Cost issues preventing use of Entresto, SGLT inhibitors.  Appears euvolemic.  Titrate CHF meds as BP permits. Status post AICD -[employer].  Keep appointment with device clinic. Hx of CVA, continue physical therapy, Eliquis, Lipitor.   Paroxysmal atrial fibrillation noted on device interrogation.  CHA2DS2-VASc of 5 (chf, age, gender, stroke) continue on Eliquis 5 mg twice daily.  Continue Toprol-XL  Follow-up in 3 months   Medication Adjustments/Labs and Tests Ordered: Current medicines are reviewed at length with the patient today.  Concerns regarding medicines are outlined above.  Orders Placed This Encounter  Procedures   EKG 12-Lead    Meds ordered this encounter  Medications   spironolactone (ALDACTONE) 25 MG tablet    Sig: Take 0.5 tablets (12.5 mg total) by mouth daily.    Dispense:  30 tablet    Refill:  3     Patient Instructions  Medication Instructions:  Start Aldactone 12.5 MG once a day  *If you need a refill on your cardiac medications before your next appointment, please call your pharmacy*   Lab Work: None ordered If you have labs (blood  work) drawn today and your tests are completely normal, you will receive your results only by: MyChart Message (if you have MyChart) OR A paper copy in the mail If you have any lab test that is abnormal or we need to change your treatment, we will call you to review the results.   Testing/Procedures: None ordered   Follow-Up: At [facility], you and your health needs are our priority.  As part of our continuing mission to provide you with exceptional heart care, we have created designated Provider Care Teams.  These Care Teams include your primary Cardiologist (physician) and Advanced Practice Providers (APPs -  Physician Assistants and Nurse Practitioners) who all work together to provide you with the care you need, when you need it.  We recommend signing up for the patient portal called "MyChart".  Sign up information is provided on this After Visit Summary.  MyChart is used to connect with patients for Virtual Visits (Telemedicine).  Patients are able to view lab/test results, encounter notes, upcoming appointments, etc.  Non-urgent messages can be sent to your provider as well.   To learn more about what you can do with MyChart, go to [email or web address].    Your next appointment:   3 month(s)  The format for your next appointment:   In Person  Provider:   Debbe Odea, MD   Other Instructions    Signed, Debbe Odea, MD  02/19/2021 12:05 PM    [facility]

## 2021-02-19 NOTE — Patient Instructions (Signed)
Medication Instructions:  Start Aldactone 12.5 MG once a day  *If you need a refill on your cardiac medications before your next appointment, please call your pharmacy*   Lab Work: None ordered If you have labs (blood work) drawn today and your tests are completely normal, you will receive your results only by: MyChart Message (if you have MyChart) OR A paper copy in the mail If you have any lab test that is abnormal or we need to change your treatment, we will call you to review the results.   Testing/Procedures: None ordered   Follow-Up: At [facility], you and your health needs are our priority.  As part of our continuing mission to provide you with exceptional heart care, we have created designated Provider Care Teams.  These Care Teams include your primary Cardiologist (physician) and Advanced Practice Providers (APPs -  Physician Assistants and Nurse Practitioners) who all work together to provide you with the care you need, when you need it.  We recommend signing up for the patient portal called "MyChart".  Sign up information is provided on this After Visit Summary.  MyChart is used to connect with patients for Virtual Visits (Telemedicine).  Patients are able to view lab/test results, encounter notes, upcoming appointments, etc.  Non-urgent messages can be sent to your provider as well.   To learn more about what you can do with MyChart, go to [email or web address].    Your next appointment:   3 month(s)  The format for your next appointment:   In Person  Provider:   Debbe Odea, MD   Other Instructions

## 2021-02-24 ENCOUNTER — Ambulatory Visit: Payer: Medicare HMO | Admitting: Physical Therapy

## 2021-02-24 ENCOUNTER — Ambulatory Visit: Payer: Medicare HMO

## 2021-02-26 ENCOUNTER — Ambulatory Visit: Payer: Medicare HMO | Admitting: Physical Therapy

## 2021-02-26 ENCOUNTER — Ambulatory Visit: Payer: Medicare HMO | Admitting: Occupational Therapy

## 2021-02-26 ENCOUNTER — Other Ambulatory Visit: Payer: Self-pay

## 2021-03-03 ENCOUNTER — Ambulatory Visit: Payer: Medicare HMO

## 2021-03-03 ENCOUNTER — Ambulatory Visit: Payer: Medicare HMO | Admitting: Physical Therapy

## 2021-03-05 ENCOUNTER — Ambulatory Visit: Payer: Medicare HMO

## 2021-03-09 ENCOUNTER — Ambulatory Visit (INDEPENDENT_AMBULATORY_CARE_PROVIDER_SITE_OTHER): Payer: Medicare HMO | Admitting: Adult Health

## 2021-03-09 ENCOUNTER — Encounter: Payer: Self-pay | Admitting: Adult Health

## 2021-03-09 VITALS — BP 112/78 | HR 82 | Ht 66.0 in | Wt 200.0 lb

## 2021-03-09 DIAGNOSIS — E785 Hyperlipidemia, unspecified: Secondary | ICD-10-CM

## 2021-03-09 DIAGNOSIS — I63411 Cerebral infarction due to embolism of right middle cerebral artery: Secondary | ICD-10-CM | POA: Diagnosis not present

## 2021-03-09 DIAGNOSIS — I480 Paroxysmal atrial fibrillation: Secondary | ICD-10-CM

## 2021-03-09 DIAGNOSIS — R7303 Prediabetes: Secondary | ICD-10-CM

## 2021-03-09 DIAGNOSIS — G441 Vascular headache, not elsewhere classified: Secondary | ICD-10-CM

## 2021-03-09 DIAGNOSIS — Z720 Tobacco use: Secondary | ICD-10-CM

## 2021-03-09 DIAGNOSIS — I10 Essential (primary) hypertension: Secondary | ICD-10-CM

## 2021-03-09 NOTE — Patient Instructions (Signed)
Continue working the OT for hopeful further recovery  Continue Elavil and topamax for headache prevention  Continue Eliquis (apixaban) daily  and atorvastatin  for secondary stroke prevention  Continue to follow up with PCP regarding cholesterol and blood pressure management  Maintain strict control of hypertension with blood pressure goal below 130/90 and cholesterol with LDL cholesterol (bad cholesterol) goal below 70 mg/dL.       Followup in the future with me in 6 months or call earlier if needed       Thank you for coming to see [place] at [facility]. I hope we have been able to provide you high quality care today.  You may receive a patient satisfaction survey over the next few weeks. We would appreciate your feedback and comments so that we may continue to improve ourselves and the health of our patients.

## 2021-03-09 NOTE — Progress Notes (Signed)
[facility] [address] [place]. [place] [address] [phone]       STROKE FOLLOW UP NOTE  Rebecca Singh Date of Birth:  03-31-1956 Medical Record Number:  [id number]   Reason for Referral:  stroke follow up    SUBJECTIVE:   CHIEF COMPLAINT:  Chief Complaint  Patient presents with   Follow-up    Rm 14 with niece holly Pt is well, headaches and weakness at prior visit aren't as bad but still occurring     HPI:   Today, 03/09/2021, Rebecca Singh returns for 26-month stroke follow-up accompanied by her niece, [name].  She has been doing well since prior visit with improvement of residual deficits with some LUE weakness -continues to work with OT.  Impairment of her gait and LLE weakness currently ambulating without assistive device and has returned back to driving and majority of IADLs.  She is hopeful to return back to work in September/October as a [employer]. Headaches have improved on amitriptyline 50 mg nightly currently occurring 1x/month usually with increased stressors.  Also remains on topamax 50mg  BID.  Denies new stroke/TIA symptoms.  Since prior visit, she has been diagnosed with atrial fibrillation which was found on pacer interrogation and Eliquis 5 mg twice daily initiated which she has been tolerating without side effects.  She has also remained on atorvastatin 40 mg daily without associated side effects.  Blood pressure today 112/78.  Unfortunately, returned back to tobacco use approx 1 pack every 3-4 days due to increased stressors.  She has a trip planned to [place] next month to stay with her brother for a while and is hopeful that stress levels can be at a minimum and plans on trying to quit smoking again.  No further concerns at this time.    History provided for reference purposes only Initial visit 11/25/2020 JM: Ms. Frayre is being seen for hospital follow-up accompanied by her niece, Rebecca Singh  Reports residual left sided weakness  with sensory impairment, visual impairment, cognitive slowing, sensory impairment, and gait impairment -currently working with [facility] PT/OT reporting continued improvement Use of RW for short distance and w/c for long distance -denies any recent falls Her greatest concern today is in regards to continued headaches which were present during admission -compliant on topiramate 50 mg twice daily.  Denies benefit with Fioricet.  FREQUENT use of tylenol - approx 8-9 500mg  tabs/day.  Reports headaches consistently throughout the day associated with photophobia and phonophobia Also struggling with increased anxiety and agitation -underlying history of anxiety/depression not currently on medication management - feels anxiety related to current medical condition and residual stroke deficits Denies new stroke/TIA symptoms  Compliant on aspirin 81 mg daily and atorvastatin 40 mg daily -denies side effects Blood pressure today 137/92 Reports complete tobacco cessation since hospital discharge  No further concerns at this time   Stroke admission 10/07/2020 Rebecca Singh is a 65 y.o. 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  She presented to [facility] ED on 10/07/2020 with severe headache, left-sided weakness and left facial droop.  Personally reviewed hospitalization pertinent progress notes, lab work and imaging with summary provided.  Initially evaluated by [facility] with stroke work-up revealing  acute ischemic right MCA stroke with right M2/MCA s/p IR with TICI 2C revascularization, concerning for cardioembolic source given cardiac history.  Unable to obtain MRI  due to recent pacemaker switch.  ICD interrogation negative for A. fib.  2D echo showed EF 30 to 35%.  LDL 65.  A1c 6.1.  Current tobacco use of smoking cessation counseling provided.  Other stroke risk factors include nonischemic cardiomyopathy, myopericarditis, reported medication noncompliance, advanced age, obesity and prior stroke history.   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 Code Stroke CT head:  Hypodensity right frontal parietal lobe suspicious for acute infarct. In addition, hyperdense sign at right sylvian fissure MCA CTA head & neck : Right M2/MCA posterior division branch occlusion with moderate distal branch opacification via leptomeningeal collaterals.  MRI: Contraindicated due to recent pacemaker switch, per MRI technician need a minimum of 6 weeks from procedure. (09/25/2020) IR - R M2 inferior division TICI 2c revasc CT head post op 1/25 - unchanged CT repeat 10/09/2020: Subacute right frontoparietal and right temporal infarct.  SAH within the right sylvian fissure is unchanged CT repeat 10/13/2020: Right MCA distribution infarct stable in size.  No evidence for hemorrhagic transformation.  Decrease hypodensity within the right sylvian fissure. 2D Echo -  EF 30 to 35%.  ICD interrogation no afib - new ICD from 09/25/20 LDL 65 HgbA1c: 6.1 VTE prophylaxis -SCDs aspirin 81 mg daily prior to admission, now on aspirin 81 mg daily, started on 10/13/2020 after repeat CT scan Topamax 25 mg twice daily for headache Therapy recommendations: CIR  Disposition: Today to CIR    ROS:   14 system review of systems performed and negative with exception of those listed in HPI  PMH:  Past Medical History:  Diagnosis Date   AICD (automatic cardioverter/defibrillator) present    COPD (chronic obstructive pulmonary disease) (HCC)    Hypothyroidism    Myocardial infarction (HCC)    Pacemaker/defibrillator     Stroke (HCC)     PSH:  Past Surgical History:  Procedure Laterality Date   ABDOMINAL HYSTERECTOMY     CHOLECYSTECTOMY     INSERT / REPLACE / REMOVE PACEMAKER     IR CT HEAD LTD  10/07/2020   IR PERCUTANEOUS ART THROMBECTOMY/INFUSION INTRACRANIAL INC DIAG ANGIO  10/07/2020   RADIOLOGY WITH ANESTHESIA N/A 10/07/2020   Procedure: IR WITH ANESTHESIA - CODE STROKE;  Surgeon: Radiologist, Medication, MD;  Location: [facility] OR;  Service: Radiology;  Laterality: N/A;   THYROIDECTOMY     TONSILLECTOMY      Social History:  Social History   Socioeconomic History   Marital status: Widowed    Spouse name: Not on file   Number of children: Not on file   Years of education: Not on file   Highest education level: Not on file  Occupational History   Not on file  Tobacco Use   Smoking status: Former    Packs/day: 1.00    Years: 15.00    Pack years: 15.00    Types: Cigarettes   Smokeless tobacco: Never  Vaping Use   Vaping Use: Never used  Substance and Sexual Activity   Alcohol use: No   Drug use: No   Sexual activity: Not Currently  Other Topics Concern   Not on file  Social History Narrative   Not on file   Social Determinants of Health   Financial Resource Strain: Not on file  Food Insecurity: Not  on file  Transportation Needs: Not on file  Physical Activity: Not on file  Stress: Not on file  Social Connections: Not on file  Intimate Partner Violence: Not on file    Family History:  Family History  Problem Relation Age of Onset   Diabetes Mother    Heart disease Mother 36       stent placed    Macular degeneration Father    Diabetes Brother    Diabetes Brother     Medications:   Current Outpatient Medications on File Prior to Visit  Medication Sig Dispense Refill   amitriptyline (ELAVIL) 50 MG tablet Take 1 tablet (50 mg total) by mouth at bedtime. 30 tablet 5   apixaban (ELIQUIS) 5 MG TABS tablet Take 1 tablet (5 mg total) by mouth 2 (two) times daily. 60 tablet  3   atorvastatin (LIPITOR) 40 MG tablet Take 1 tablet by mouth daily.     levothyroxine (SYNTHROID) 125 MCG tablet Take 1 tablet by mouth daily.     losartan (COZAAR) 25 MG tablet Take 1 tablet by mouth daily.     metoprolol succinate (TOPROL XL) 25 MG 24 hr tablet Take 1 tablet (25 mg total) by mouth daily. 30 tablet 5   omeprazole (PRILOSEC) 40 MG capsule Take 1 capsule by mouth daily.     Rimegepant Sulfate (NURTEC) 75 MG TBDP Take 75 mg by mouth daily. 6 tablet 0   spironolactone (ALDACTONE) 25 MG tablet Take 0.5 tablets (12.5 mg total) by mouth daily. 30 tablet 3   topiramate (TOPAMAX) 50 MG tablet Take 1 tablet (50 mg total) by mouth 2 (two) times daily. 60 tablet 3   No current facility-administered medications on file prior to visit.    Allergies:   Allergies  Allergen Reactions   Codeine Shortness Of Breath   Aspirin Other (See Comments)    "burns" the patient's stomach   Latex Other (See Comments)    Makes the "skin peel"    Other Nausea Only and Other (See Comments)    Spicy foods- "Upset the patient's stomach"      OBJECTIVE:  Physical Exam  Vitals:   03/09/21 1527  BP: 112/78  Pulse: 82  Weight: 200 lb (90.7 kg)  Height: 5\' 6"  (1.676 m)   Body mass index is 32.28 kg/m. No results found.   General: well developed, well nourished,  pleasant middle-aged Caucasian female, seated, in no evident distress Head: head normocephalic and atraumatic.   Neck: supple with no carotid or supraclavicular bruits Cardiovascular: regular rate and rhythm, no murmurs Musculoskeletal: no deformity Skin:  no rash/petichiae Vascular:  Normal pulses all extremities   Neurologic Exam Mental Status: Awake and fully alert.   Fluent speech and language.  Oriented to place and time. Recent memory subjectively impaired and remote memory intact. Attention span, concentration and fund of knowledge appropriate appropriate during visit. Mood and affect appropriate.  Cranial Nerves:  Pupils equal, briskly reactive to light. Extraocular movements full without nystagmus. Visual fields full to confrontation. Hearing intact. Facial sensation intact. Face, tongue, palate moves normally and symmetrically.  Motor: Normal strength, bulk and tone on right upper and lower extremity.  LUE: 4+/5 with decreased grip strength and hand dexterity LLE: 5/5 Sensory.:  intact to light touch, vibratory and pinprick sensation Coordination: Rapid alternating movements normal in all extremities except slightly decreased decreased left hand. Finger-to-nose and heel-to-shin performed accurately bilaterally. Gait and Station: Stands from seated position without difficulty.  Gait demonstrates normal stride  length and balance without use of assistive device Reflexes: 1+ and symmetric. Toes downgoing.         ASSESSMENT: Rebecca Singh is a 65 y.o. year old female presented with headache, left-sided weakness and left facial droop on 10/07/2020 with stroke work-up revealing acute ischemic right MCA infarct with right M2/MCA posterior division branch occlusion s/p IR with TICI 2C reperfusion, concerning for cardioembolic source given extensive cardiac history. Vascular risk factors include polysubstance abuse, chronic pain, nonischemic cardiomyopathy (recent EF 30 to 35%), s/p chamber replacement for pacemaker 09/25/2020, new dx of A.fib, history of postpartum stroke, tobacco use, HTN, pre-DM and obesity.      PLAN:  R MCA stroke:  Residual deficit: She has made excellent recovery since prior visit with only residual mild LUE weakness -continue working with OT for hopeful further recovery Vascular headache: Great improvement on amitriptyline 50 mg nightly which is recommended to continue.  Discussed decreasing dosage on topiramate but as she has been doing so well, she is hesitant to change dosage at this time. Will discuss at follow-up visit. Continue Eliquis (apixaban) daily and atorvastatin 40mg   daily for secondary stroke prevention.   Discussed secondary stroke prevention measures and importance of close PCP follow up for aggressive stroke risk factor management  Atrial fibrillation, new diagnosis: dx'd after ICD interrogation.  On Eliquis 5 mg twice daily routinely followed by cardiology HTN: BP goal <130/90.  Stable on current regimen per cardiology HLD: LDL goal<70.  Prior LDL 65.  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  Prior A1c 6.1.  Monitor by PCP Tobacco use: Discussed importance of complete tobacco cessation.  She is hopeful to quit over the next month    Follow up in 6 months or call earlier if needed   CC:  [facility] provider: Dr. Pearlean Brownie PCP: [facility]    I spent 36 minutes of face-to-face and non-face-to-face time with patient and niece.  This included previsit chart review, lab review, study review, electronic health record documentation, patient education and discussion regarding prior stroke and new dx of A fib likely contributing to stroke, secondary stroke prevention measures and importance of aggressive stroke risk factor management, residual deficits, vascular headache and answered all other questions to patient and patient satisfaction  Ihor Austin, AGNP-BC  [facility] [address] [place], [place] [address]  Phone [phone] Fax [phone] Note: This document was prepared with digital dictation and possible smart phrase technology. Any transcriptional errors that result from this process are unintentional.

## 2021-03-10 ENCOUNTER — Ambulatory Visit: Payer: Medicare HMO | Admitting: Occupational Therapy

## 2021-03-10 ENCOUNTER — Ambulatory Visit: Payer: Medicare HMO | Admitting: Physical Therapy

## 2021-03-12 ENCOUNTER — Ambulatory Visit: Payer: Medicare HMO

## 2021-03-12 ENCOUNTER — Ambulatory Visit: Payer: Medicare HMO | Admitting: Occupational Therapy

## 2021-03-13 NOTE — Progress Notes (Signed)
I agree with the above plan 

## 2021-03-18 ENCOUNTER — Ambulatory Visit: Payer: Medicare HMO

## 2021-03-18 ENCOUNTER — Ambulatory Visit: Payer: Medicare HMO | Attending: Physician Assistant

## 2021-03-20 ENCOUNTER — Ambulatory Visit: Payer: Medicare HMO

## 2021-03-25 ENCOUNTER — Ambulatory Visit: Payer: Medicare HMO

## 2021-03-25 ENCOUNTER — Ambulatory Visit: Payer: Medicare HMO | Admitting: Occupational Therapy

## 2021-03-25 ENCOUNTER — Encounter: Payer: Self-pay | Admitting: Occupational Therapy

## 2021-03-25 NOTE — Therapy (Signed)
[place] [facility] MAIN [facility] SERVICES [address] [place], [place], [address] Phone: [phone]   Fax:  [phone]  March 25, 2021    No Recipients  Occupational Therapy Discharge Summary   Patient: Rebecca Singh MRN: [id number] Date of Birth: 13-Dec-1955  Diagnosis:CVA  No data recorded  The above patient had been seen in Occupational Therapy  times of 6 treatments scheduled with multiple no shows and  cancellations.  The treatment consisted of ADL, and IADL training, UE there. Ex., neuromuscular re-education, pt. education  The patient is: Improved  Subjective:   Discharge Findings: Pt. reports that she has been sick and unable to attend therapy, however pt. Reports that she has improved with ADL, and IADL functioning, and is able to now able to perform daily care tasks. Pt. had made progress with her FOTO score, Left UE strength, and left hand [facility] skills. Pt. Has requested to discharge OT services at this time.      OT Long Term Goals -  03/25/2021      OT LONG TERM GOAL #1   Title Patient will be modified independent with home exercise program    Baseline 02/17/2021: Pt. has pink theraputty.  Eval: No current program    Time 12    Period Weeks    Status On-going    Target Date 05/12/21      OT LONG TERM GOAL #2   Title Patient will improve grip strength of left hand by 10# to be able to stabilize and cut food with modified independence.    Baseline 02/17/2021: Left grip 1# Eval: unable    Time 12    Period Weeks    Status On-going    Target Date 05/12/21      OT LONG TERM GOAL #3   Title Patient will demonstrate washing dishes in standing for up to 10 mins without rest breaks with modified independence.    Baseline 02/17/2021: Independent. No rest breaks.Eval difficulty with task.    Status Achieved      OT LONG TERM GOAL #4   Title Patient will increase strength in left shoulder by 1 mm grade to place clothing in closet with  modified independence.    Baseline 02/17/2021: LUE strength: 4/5. Eval:  strength 2/5    Time 12    Period Weeks    Status Revised    Target Date 05/12/21      OT LONG TERM GOAL #5   Title Pt.  will improve functional hand strength to open jars and containers with occasional min assist.    Baseline 02/17/2021: Pt continues to have difficulty. Eval: unable to open jars and containers    Time 12    Period Weeks    Status On-going    Target Date 05/12/21      Long Term Additional Goals   Additional Long Term Goals Yes      OT LONG TERM GOAL #6   Title Pt will complete light meal prep with modified independence    Baseline 02/17/2021: Pt. is independent. Eval: unable    Time 12    Period Weeks    Status Achieved    Target Date --      OT LONG TERM GOAL #7   Title Patient will decrease 9 hole peg test  by 15 secs to manipulate small objects with modified independence and with good speed.    Baseline 02/17/2021: 36 sec. Eval: 50secs    Time 8  Period Weeks    Status On-going    Target Date 05/12/21      OT LONG TERM GOAL #8   Title Pt will improve FOTO score to 57 or above to show a clinically signifcant change function to complete daily tasks with greater independence.    Baseline 02/17/2021: 61; Eval: 44    Time 12    Period Weeks    Status On-going    Target Date 05/12/21      OT LONG TERM GOAL  #9   TITLE Pt. will independently, and firmly hold a large utensil while stirring.    Baseline 02/17/2021: Pt has difficulty firmly holding a large utensil while stirring.    Time 12    Period Weeks    Status New    Target Date 05/12/21      OT LONG TERM GOAL  #10   TITLE Pt. will improve left pinch strength to be able to safely, and efficiently cut meat and veggies.    Baseline 02/17/2021: Pt. has difficulty using utensils to cut meat, and veggies.    Time 12    Period Weeks    Status New    Target Date 05/12/21      OT LONG TERM GOAL  #11   TITLE Pt. will use her left hand  to independently buckle, and unbuckle her seatbelt.    Baseline 02/17/2021: Pt. is unable to buckle/unbuckle her seatbelt    Time 12    Period Weeks    Status New    Target Date 05/12/21      OT LONG TERM GOAL  #12   TITLE Pt. will independently use her left hand to reach into her back pocket to retrieve her phone.    Baseline 02/17/2021: Pt. has difficulty reaching to her back pocket with her left hand for her cellphoone.    Time 12    Period Weeks    Status New    Target Date 05/12/21              Sincerely,  Olegario Messier, MS, OTR/L  CC No Recipients  [facility] MAIN [facility] SERVICES [address] [place], [place], [address] Phone: [phone]   Fax:  [phone]  Patient: Rebecca Singh MRN: [id number] Date of Birth: 1955/10/21

## 2021-03-27 ENCOUNTER — Ambulatory Visit: Payer: Medicare HMO

## 2021-04-01 ENCOUNTER — Ambulatory Visit: Payer: Medicare HMO

## 2021-04-01 ENCOUNTER — Encounter: Payer: Medicare HMO | Admitting: Occupational Therapy

## 2021-04-03 ENCOUNTER — Ambulatory Visit: Payer: Medicare HMO

## 2021-04-08 ENCOUNTER — Ambulatory Visit: Payer: Medicare HMO

## 2021-04-10 ENCOUNTER — Encounter: Payer: Medicare HMO | Admitting: Occupational Therapy

## 2021-04-10 ENCOUNTER — Ambulatory Visit: Payer: Medicare HMO

## 2021-04-15 ENCOUNTER — Ambulatory Visit (INDEPENDENT_AMBULATORY_CARE_PROVIDER_SITE_OTHER): Payer: Medicare HMO

## 2021-04-15 ENCOUNTER — Encounter: Payer: Medicare HMO | Admitting: Occupational Therapy

## 2021-04-15 ENCOUNTER — Ambulatory Visit: Payer: Medicare HMO

## 2021-04-15 DIAGNOSIS — I428 Other cardiomyopathies: Secondary | ICD-10-CM | POA: Diagnosis not present

## 2021-04-17 ENCOUNTER — Ambulatory Visit: Payer: Medicare HMO

## 2021-04-17 ENCOUNTER — Encounter: Payer: Medicare HMO | Admitting: Occupational Therapy

## 2021-04-20 LAB — CUP PACEART REMOTE DEVICE CHECK
Battery Remaining Longevity: 90 mo
Battery Remaining Percentage: 100 %
Brady Statistic RA Percent Paced: 17 %
Brady Statistic RV Percent Paced: 87 %
Date Time Interrogation Session: 20220804173600
HighPow Impedance: 53 Ohm
Implantable Lead Implant Date: 20150301
Implantable Lead Implant Date: 20150301
Implantable Lead Implant Date: 20150301
Implantable Lead Location: 753858
Implantable Lead Location: 753859
Implantable Lead Location: 753860
Implantable Lead Model: 4470
Implantable Lead Model: 4677
Implantable Lead Model: 696
Implantable Lead Serial Number: 145508
Implantable Lead Serial Number: 501050
Implantable Lead Serial Number: 769161
Implantable Pulse Generator Implant Date: 20220113
Lead Channel Impedance Value: 310 Ohm
Lead Channel Impedance Value: 395 Ohm
Lead Channel Impedance Value: 463 Ohm
Lead Channel Setting Pacing Amplitude: 2 V
Lead Channel Setting Pacing Amplitude: 2.8 V
Lead Channel Setting Pacing Amplitude: 3 V
Lead Channel Setting Pacing Pulse Width: 0.5 ms
Lead Channel Setting Pacing Pulse Width: 1 ms
Lead Channel Setting Sensing Sensitivity: 0.6 mV
Lead Channel Setting Sensing Sensitivity: 1 mV
Pulse Gen Serial Number: 267431

## 2021-04-22 ENCOUNTER — Encounter: Payer: Self-pay | Admitting: Cardiology

## 2021-04-22 ENCOUNTER — Ambulatory Visit: Payer: Medicare HMO

## 2021-04-22 ENCOUNTER — Other Ambulatory Visit: Payer: Self-pay

## 2021-04-22 ENCOUNTER — Other Ambulatory Visit: Payer: Self-pay | Admitting: Registered Nurse

## 2021-04-22 ENCOUNTER — Ambulatory Visit (INDEPENDENT_AMBULATORY_CARE_PROVIDER_SITE_OTHER): Payer: Medicare HMO | Admitting: Cardiology

## 2021-04-22 ENCOUNTER — Encounter: Payer: Medicare HMO | Admitting: Occupational Therapy

## 2021-04-22 VITALS — BP 132/82 | Ht 65.0 in | Wt 199.0 lb

## 2021-04-22 DIAGNOSIS — I480 Paroxysmal atrial fibrillation: Secondary | ICD-10-CM

## 2021-04-22 DIAGNOSIS — I428 Other cardiomyopathies: Secondary | ICD-10-CM | POA: Diagnosis not present

## 2021-04-22 DIAGNOSIS — Z95810 Presence of automatic (implantable) cardiac defibrillator: Secondary | ICD-10-CM | POA: Diagnosis not present

## 2021-04-22 NOTE — Patient Instructions (Signed)
Medication Instructions:  Your physician recommends that you continue on your current medications as directed. Please refer to the Current Medication list given to you today.  *If you need a refill on your cardiac medications before your next appointment, please call your pharmacy*   Lab Work: None ordered.  If you have labs (blood work) drawn today and your tests are completely normal, you will receive your results only by: MyChart Message (if you have MyChart) OR A paper copy in the mail If you have any lab test that is abnormal or we need to change your treatment, we will call you to review the results.   Testing/Procedures: None ordered.    Follow-Up: At [facility], you and your health needs are our priority.  As part of our continuing mission to provide you with exceptional heart care, we have created designated Provider Care Teams.  These Care Teams include your primary Cardiologist (physician) and Advanced Practice Providers (APPs -  Physician Assistants and Nurse Practitioners) who all work together to provide you with the care you need, when you need it.  We recommend signing up for the patient portal called "MyChart".  Sign up information is provided on this After Visit Summary.  MyChart is used to connect with patients for Virtual Visits (Telemedicine).  Patients are able to view lab/test results, encounter notes, upcoming appointments, etc.  Non-urgent messages can be sent to your provider as well.   To learn more about what you can do with MyChart, go to [email or web address].    Your next appointment:   12 month(s)  The format for your next appointment:   In Person  Provider:   Cameron Lambert, MD    

## 2021-04-22 NOTE — Progress Notes (Signed)
Electrophysiology Office Follow up Visit Note:    Date:  04/22/2021   ID:  Rebecca Singh, DOB [birth year], MRN [id number]  PCP:  [facility] Cardiologist:  Debbe Odea, MD  [facility] Electrophysiologist:  Lanier Prude, MD    Interval History:    Rebecca Singh is a 65 y.o. female who presents for a follow up visit. They were last seen in clinic Jan 14, 2021 to establish care for pacemaker.  She has a CRT-D implanted.  She was previously followed at [facility].  At that appointment we diagnosed new atrial fibrillation and started her on Eliquis for stroke prophylaxis.  She presents today for follow-up.     Past Medical History:  Diagnosis Date   AICD (automatic cardioverter/defibrillator) present    COPD (chronic obstructive pulmonary disease) (HCC)    Hypothyroidism    Myocardial infarction (HCC)    Pacemaker/defibrillator    Stroke [facility])     Past Surgical History:  Procedure Laterality Date   ABDOMINAL HYSTERECTOMY     CHOLECYSTECTOMY     INSERT / REPLACE / REMOVE PACEMAKER     IR CT HEAD LTD  10/07/2020   IR PERCUTANEOUS ART THROMBECTOMY/INFUSION INTRACRANIAL INC DIAG ANGIO  10/07/2020   RADIOLOGY WITH ANESTHESIA N/A 10/07/2020   Procedure: IR WITH ANESTHESIA - CODE STROKE;  Surgeon: Radiologist, Medication, MD;  Location: [facility] OR;  Service: Radiology;  Laterality: N/A;   THYROIDECTOMY     TONSILLECTOMY      Current Medications: Current Meds  Medication Sig   amitriptyline (ELAVIL) 50 MG tablet Take 1 tablet (50 mg total) by mouth at bedtime.   apixaban (ELIQUIS) 5 MG TABS tablet Take 1 tablet (5 mg total) by mouth 2 (two) times daily.   atorvastatin (LIPITOR) 40 MG tablet Take 1 tablet by mouth daily.   levothyroxine (SYNTHROID) 125 MCG tablet Take 1 tablet by mouth daily.   losartan (COZAAR) 25 MG tablet Take 1 tablet by mouth daily.   metoprolol succinate (TOPROL XL) 25 MG 24 hr tablet Take 1 tablet (25 mg total)  by mouth daily.   pantoprazole (PROTONIX) 40 MG tablet Take by mouth.   Rimegepant Sulfate (NURTEC) 75 MG TBDP Take 75 mg by mouth daily.   spironolactone (ALDACTONE) 25 MG tablet Take 0.5 tablets (12.5 mg total) by mouth daily.   topiramate (TOPAMAX) 50 MG tablet Take 1 tablet by mouth twice daily     Allergies:   Codeine, Aspirin, Latex, and Other   Social History   Socioeconomic History   Marital status: Widowed    Spouse name: Not on file   Number of children: Not on file   Years of education: Not on file   Highest education level: Not on file  Occupational History   Not on file  Tobacco Use   Smoking status: Former    Packs/day: 1.00    Years: 15.00    Pack years: 15.00    Types: Cigarettes   Smokeless tobacco: Never  Vaping Use   Vaping Use: Never used  Substance and Sexual Activity   Alcohol use: No   Drug use: No   Sexual activity: Not Currently  Other Topics Concern   Not on file  Social History Narrative   Not on file   Social Determinants of Health   Financial Resource Strain: Not on file  Food Insecurity: Not on file  Transportation Needs: Not on file  Physical Activity: Not on file  Stress:  Not on file  Social Connections: Not on file     Family History: The patient's family history includes Diabetes in her brother, brother, and mother; Heart disease (age of onset: 45) in her mother; Macular degeneration in her father.  ROS:   Please see the history of present illness.    All other systems reviewed and are negative.  EKGs/Labs/Other Studies Reviewed:    The following studies were reviewed today:   April 22, 2021 in clinic device interrogation personally reviewed Battery longevity 7.5 years Lead parameters all stable 99% biventricular paced False PMT episode noted Fibrillation episode reviewed Underlying rhythm sinus rhythm at 80 bpm  Recent Labs: 10/08/2020: TSH 2.368 10/14/2020: ALT 20 10/21/2020: BUN 27; Creatinine, Ser 0.94; Potassium  3.5; Sodium 139 10/22/2020: Hemoglobin 15.0; Platelets 275  Recent Lipid Panel    Component Value Date/Time   CHOL 124 10/08/2020 0443   TRIG 52 10/08/2020 0443   HDL 49 10/08/2020 0443   CHOLHDL 2.5 10/08/2020 0443   VLDL 10 10/08/2020 0443   LDLCALC 65 10/08/2020 0443    Physical Exam:    VS:  BP 132/82 (BP Location: Left Arm, Patient Position: Sitting, Cuff Size: Normal)   Ht 5\' 5"  (1.651 m)   Wt 199 lb (90.3 kg)   SpO2 98%   BMI 33.12 kg/m     Wt Readings from Last 3 Encounters:  04/22/21 199 lb (90.3 kg)  03/09/21 200 lb (90.7 kg)  02/19/21 206 lb 2 oz (93.5 kg)     GEN:  Well nourished, well developed in no acute distress.  Obese HEENT: Normal NECK: No JVD; No carotid bruits LYMPHATICS: No lymphadenopathy CARDIAC: RRR, no murmurs, rubs, gallops.  Device pocket well-healed on left chest. RESPIRATORY:  Clear to auscultation without rales, wheezing or rhonchi  ABDOMEN: Soft, non-tender, non-distended MUSCULOSKELETAL:  No edema; No deformity  SKIN: Warm and dry NEUROLOGIC:  Alert and oriented x 3 PSYCHIATRIC:  Normal affect   ASSESSMENT:    1. Paroxysmal atrial fibrillation (HCC)   2. Nonischemic cardiomyopathy (HCC)   3. Cardiac resynchronization therapy defibrillator (CRT-D) in place    PLAN:    In order of problems listed above:   1. Paroxysmal atrial fibrillation (HCC) Overall low burden of atrial fibrillation.  Continue Eliquis for stroke prophylaxis.  We will continue to monitor her A. fib burden using her device.  I will plan to see her back in about 1 year or sooner as needed.  Continue remote monitoring in the interim.  2. Nonischemic cardiomyopathy (HCC) NYHA class II.  Warm and dry on exam today.  Last ejection fraction 30 to 35% in January 2022.  Continue losartan, metoprolol, spironolactone. Continue routine follow-up with Dr. February 2022.  3. Cardiac resynchronization therapy defibrillator (CRT-D) in place Device functioning well.  Continue  remote monitoring.   Follow-up in 1 year.   Medication Adjustments/Labs and Tests Ordered: Current medicines are reviewed at length with the patient today.  Concerns regarding medicines are outlined above.  No orders of the defined types were placed in this encounter.  No orders of the defined types were placed in this encounter.    Signed, Azucena Cecil, MD, [facility], [facility] 04/22/2021 7:39 PM    Electrophysiology [facility]

## 2021-04-24 ENCOUNTER — Ambulatory Visit: Payer: Medicare HMO

## 2021-04-24 ENCOUNTER — Encounter: Payer: Medicare HMO | Admitting: Occupational Therapy

## 2021-04-27 ENCOUNTER — Other Ambulatory Visit: Payer: Self-pay | Admitting: Registered Nurse

## 2021-04-29 ENCOUNTER — Encounter: Payer: Medicare HMO | Admitting: Occupational Therapy

## 2021-04-29 ENCOUNTER — Ambulatory Visit: Payer: Medicare HMO

## 2021-05-01 ENCOUNTER — Ambulatory Visit: Payer: Medicare HMO

## 2021-05-01 ENCOUNTER — Encounter: Payer: Medicare HMO | Admitting: Occupational Therapy

## 2021-05-06 ENCOUNTER — Ambulatory Visit: Payer: Medicare HMO

## 2021-05-06 ENCOUNTER — Encounter: Payer: Medicare HMO | Admitting: Occupational Therapy

## 2021-05-08 ENCOUNTER — Encounter: Payer: Medicare HMO | Admitting: Occupational Therapy

## 2021-05-08 ENCOUNTER — Ambulatory Visit: Payer: Medicare HMO

## 2021-05-11 ENCOUNTER — Other Ambulatory Visit: Payer: Self-pay | Admitting: *Deleted

## 2021-05-11 MED ORDER — METOPROLOL SUCCINATE ER 25 MG PO TB24: 25.0000 mg | ORAL_TABLET | Freq: Every day | ORAL | 1 refills | Status: AC

## 2021-05-12 NOTE — Progress Notes (Signed)
ICD check 

## 2021-05-13 ENCOUNTER — Ambulatory Visit: Payer: Medicare HMO

## 2021-05-13 ENCOUNTER — Encounter: Payer: Medicare HMO | Admitting: Occupational Therapy

## 2021-05-15 ENCOUNTER — Ambulatory Visit: Payer: Medicare HMO

## 2021-05-15 ENCOUNTER — Encounter: Payer: Medicare HMO | Admitting: Occupational Therapy

## 2021-05-20 ENCOUNTER — Other Ambulatory Visit: Payer: Self-pay | Admitting: Registered Nurse

## 2021-05-20 ENCOUNTER — Encounter: Payer: Medicare HMO | Admitting: Occupational Therapy

## 2021-05-22 ENCOUNTER — Ambulatory Visit: Payer: Medicare HMO | Admitting: Cardiology

## 2021-05-25 ENCOUNTER — Ambulatory Visit: Payer: Medicare HMO | Admitting: Cardiology

## 2021-05-26 ENCOUNTER — Other Ambulatory Visit: Payer: Self-pay | Admitting: Registered Nurse

## 2021-05-27 ENCOUNTER — Encounter: Payer: Medicare HMO | Admitting: Occupational Therapy

## 2021-05-29 ENCOUNTER — Encounter: Payer: Medicare HMO | Admitting: Occupational Therapy

## 2021-06-03 ENCOUNTER — Encounter: Payer: Medicare HMO | Admitting: Occupational Therapy

## 2021-06-03 ENCOUNTER — Other Ambulatory Visit: Payer: Self-pay | Admitting: Registered Nurse

## 2021-06-05 ENCOUNTER — Encounter: Payer: Medicare HMO | Admitting: Occupational Therapy

## 2021-06-10 ENCOUNTER — Encounter: Payer: Medicare HMO | Admitting: Occupational Therapy

## 2021-06-12 ENCOUNTER — Encounter: Payer: Medicare HMO | Admitting: Occupational Therapy

## 2021-06-15 ENCOUNTER — Other Ambulatory Visit: Payer: Self-pay | Admitting: Cardiology

## 2021-06-15 NOTE — Telephone Encounter (Signed)
Prescription refill request for Eliquis received.  Indication: afib  Last office visit: 04/22/2021, Lalla Brothers Scr: 0.92, 12/10/2020 Age: 65 yo  Weight: 90.3 kg    Refill sent.

## 2021-06-17 ENCOUNTER — Encounter: Payer: Medicare HMO | Admitting: Occupational Therapy

## 2021-06-18 ENCOUNTER — Other Ambulatory Visit: Payer: Self-pay | Admitting: Cardiology

## 2021-06-19 ENCOUNTER — Encounter: Payer: Medicare HMO | Admitting: Occupational Therapy

## 2021-06-19 NOTE — Telephone Encounter (Signed)
Pt last saw Dr Lalla Brothers 04/22/21, last labs 12/10/20 Creat 0.92, age 65, weight 90.3kg, based on specified criteria pt is not on appropriate dosage of Eliquis 5mg  BID for afib.  Will refill rx.

## 2021-06-24 ENCOUNTER — Encounter: Payer: Medicare HMO | Admitting: Occupational Therapy

## 2021-06-26 ENCOUNTER — Encounter: Payer: Medicare HMO | Admitting: Occupational Therapy

## 2021-07-01 ENCOUNTER — Encounter: Payer: Medicare HMO | Admitting: Occupational Therapy

## 2021-07-03 ENCOUNTER — Encounter: Payer: Medicare HMO | Admitting: Occupational Therapy

## 2021-07-08 ENCOUNTER — Encounter: Payer: Medicare HMO | Admitting: Occupational Therapy

## 2021-07-10 ENCOUNTER — Encounter: Payer: Medicare HMO | Admitting: Occupational Therapy

## 2021-07-14 ENCOUNTER — Other Ambulatory Visit: Payer: Self-pay | Admitting: Adult Health

## 2021-07-15 ENCOUNTER — Ambulatory Visit (INDEPENDENT_AMBULATORY_CARE_PROVIDER_SITE_OTHER): Payer: Medicare HMO

## 2021-07-15 ENCOUNTER — Encounter: Payer: Medicare HMO | Admitting: Occupational Therapy

## 2021-07-15 DIAGNOSIS — I428 Other cardiomyopathies: Secondary | ICD-10-CM

## 2021-07-16 LAB — CUP PACEART REMOTE DEVICE CHECK
Battery Remaining Longevity: 84 mo
Battery Remaining Percentage: 97 %
Brady Statistic RA Percent Paced: 14 %
Brady Statistic RV Percent Paced: 90 %
Date Time Interrogation Session: 20221103144500
HighPow Impedance: 48 Ohm
Implantable Lead Implant Date: 20150301
Implantable Lead Implant Date: 20150301
Implantable Lead Implant Date: 20150301
Implantable Lead Location: 753858
Implantable Lead Location: 753859
Implantable Lead Location: 753860
Implantable Lead Model: 4470
Implantable Lead Model: 4677
Implantable Lead Model: 696
Implantable Lead Serial Number: 145508
Implantable Lead Serial Number: 501050
Implantable Lead Serial Number: 769161
Implantable Pulse Generator Implant Date: 20220113
Lead Channel Impedance Value: 309 Ohm
Lead Channel Impedance Value: 402 Ohm
Lead Channel Impedance Value: 407 Ohm
Lead Channel Setting Pacing Amplitude: 2 V
Lead Channel Setting Pacing Amplitude: 2.8 V
Lead Channel Setting Pacing Amplitude: 3 V
Lead Channel Setting Pacing Pulse Width: 0.5 ms
Lead Channel Setting Pacing Pulse Width: 1 ms
Lead Channel Setting Sensing Sensitivity: 0.6 mV
Lead Channel Setting Sensing Sensitivity: 1 mV
Pulse Gen Serial Number: 267431

## 2021-07-17 ENCOUNTER — Encounter: Payer: Medicare HMO | Admitting: Occupational Therapy

## 2021-07-21 NOTE — Progress Notes (Signed)
Remote ICD transmission.   

## 2021-07-22 ENCOUNTER — Encounter: Payer: Medicare HMO | Admitting: Occupational Therapy

## 2021-07-24 ENCOUNTER — Encounter: Payer: Medicare HMO | Admitting: Occupational Therapy

## 2021-07-29 ENCOUNTER — Encounter: Payer: Medicare HMO | Admitting: Occupational Therapy

## 2021-07-31 ENCOUNTER — Encounter: Payer: Medicare HMO | Admitting: Occupational Therapy

## 2021-08-05 ENCOUNTER — Encounter: Payer: Medicare HMO | Admitting: Occupational Therapy

## 2021-08-07 ENCOUNTER — Encounter: Payer: Medicare HMO | Admitting: Occupational Therapy

## 2021-08-12 ENCOUNTER — Encounter: Payer: Medicare HMO | Admitting: Occupational Therapy

## 2021-08-14 ENCOUNTER — Encounter: Payer: Medicare HMO | Admitting: Occupational Therapy

## 2021-08-19 ENCOUNTER — Encounter: Payer: Medicare HMO | Admitting: Occupational Therapy

## 2021-08-21 ENCOUNTER — Encounter: Payer: Medicare HMO | Admitting: Occupational Therapy

## 2021-08-26 ENCOUNTER — Encounter: Payer: Medicare HMO | Admitting: Occupational Therapy

## 2021-08-28 ENCOUNTER — Encounter: Payer: Medicare HMO | Admitting: Occupational Therapy

## 2021-09-02 ENCOUNTER — Encounter: Payer: Medicare HMO | Admitting: Occupational Therapy

## 2021-09-04 ENCOUNTER — Encounter: Payer: Medicare HMO | Admitting: Occupational Therapy

## 2021-09-09 ENCOUNTER — Encounter: Payer: Medicare HMO | Admitting: Occupational Therapy

## 2021-09-11 ENCOUNTER — Encounter: Payer: Medicare HMO | Admitting: Occupational Therapy

## 2021-09-17 ENCOUNTER — Ambulatory Visit (INDEPENDENT_AMBULATORY_CARE_PROVIDER_SITE_OTHER): Payer: Medicare HMO | Admitting: Adult Health

## 2021-09-17 ENCOUNTER — Encounter: Payer: Self-pay | Admitting: Adult Health

## 2021-09-17 VITALS — BP 138/90 | HR 67 | Ht 66.5 in | Wt 209.4 lb

## 2021-09-17 DIAGNOSIS — I63411 Cerebral infarction due to embolism of right middle cerebral artery: Secondary | ICD-10-CM

## 2021-09-17 DIAGNOSIS — G441 Vascular headache, not elsewhere classified: Secondary | ICD-10-CM | POA: Diagnosis not present

## 2021-09-17 MED ORDER — AMITRIPTYLINE HCL 50 MG PO TABS: 50.0000 mg | ORAL_TABLET | Freq: Every day | ORAL | 5 refills | Status: DC

## 2021-09-17 MED ORDER — TOPIRAMATE 25 MG PO TABS: 25.0000 mg | ORAL_TABLET | Freq: Two times a day (BID) | ORAL | 5 refills | Status: DC

## 2021-09-17 NOTE — Progress Notes (Deleted)
[facility] [address] [place]. [place] [address] [phone]       STROKE FOLLOW UP NOTE  [name] Date of Birth:  July 24, 1956 Medical Record Number:  [id number]   Reason for Referral:  stroke follow up    SUBJECTIVE:   CHIEF COMPLAINT:  Chief Complaint  Patient presents with   Cerebrovascular Accident    Rm 2, 6 month FU "have been out of atorvastatin, losartan, metoprolol- seeing PCP on 09/24/21"     HPI:   Update 09/17/2021 JM: Returns for 58-month stroke follow-up.  Overall stable.  Denies new stroke/TIA symptoms.  Residual LUE weakness ***.  Continues to maintain ADLs and majority of IADLs.  Returned back to work back in August without difficulty ***.  On amitriptyline and topiramate for headache prevention ***.  Use of Nurtec ***.  Use of tizanidine provided by PCP. compliant on Eliquis and atorvastatin.  Blood pressure today ***.  Tobacco use ***.      History provided for reference purposes only Update 03/09/2021 JM: Rebecca Singh returns for 29-month stroke follow-up accompanied by her niece, [name].  She has been doing well since prior visit with improvement of residual deficits with some LUE weakness -continues to work with OT.  Impairment of her gait and LLE weakness currently ambulating without assistive device and has returned back to driving and majority of IADLs.  She is hopeful to return back to work in September/October as a [employer]. Headaches have improved on amitriptyline 50 mg nightly currently occurring 1x/month usually with increased stressors.  Also remains on topamax 50mg  BID.  Denies new stroke/TIA symptoms.  Since prior visit, she has been diagnosed with atrial fibrillation which was found on pacer interrogation and Eliquis 5 mg twice daily initiated which she has been tolerating without side effects.  She has also remained on atorvastatin 40 mg daily without associated side effects.  Blood pressure today 112/78.   Unfortunately, returned back to tobacco use approx 1 pack every 3-4 days due to increased stressors.  She has a trip planned to [place] next month to stay with her brother for a while and is hopeful that stress levels can be at a minimum and plans on trying to quit smoking again.  No further concerns at this time.  Initial visit 11/25/2020 JM: Rebecca Singh is being seen for hospital follow-up accompanied by her niece, [name]  Reports residual left sided weakness with sensory impairment, visual impairment, cognitive slowing, sensory impairment, and gait impairment -currently working with [place] [facility] PT/OT reporting continued improvement Use of RW for short distance and w/c for long distance -denies any recent falls Her greatest concern today is in regards to continued headaches which were present during admission -compliant on topiramate 50 mg twice daily.  Denies benefit with Fioricet.  FREQUENT use of tylenol - approx 8-9 500mg  tabs/day.  Reports headaches consistently throughout the day associated with photophobia and phonophobia Also struggling with increased anxiety and agitation -underlying history of anxiety/depression not currently on medication management - feels anxiety related to current medical condition and residual stroke deficits Denies new stroke/TIA symptoms  Compliant on aspirin 81 mg daily and atorvastatin 40 mg daily -denies side effects Blood pressure today 137/92 Reports complete tobacco cessation since hospital discharge  No further concerns at this time   Stroke admission 10/07/2020 Rebecca Singh is a 66 y.o. 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  She presented to [facility] ED on 10/07/2020 with severe headache, left-sided weakness and left facial droop.  Personally reviewed hospitalization pertinent progress notes, lab work and imaging with summary provided.  Initially evaluated by Dr. 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  Unable to obtain MRI due to recent pacemaker switch.  ICD interrogation negative for A. fib.  2D echo showed EF 30 to 35%.  LDL 65.  A1c 6.1.  Current tobacco use of smoking cessation counseling provided.  Other stroke risk factors include nonischemic cardiomyopathy, myopericarditis, reported medication noncompliance, advanced age, obesity and prior stroke history.   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 Code Stroke CT head:  Hypodensity right frontal parietal lobe suspicious for acute infarct. In addition, hyperdense sign at right sylvian fissure MCA CTA head & neck : Right M2/MCA posterior division branch occlusion with moderate distal branch opacification via leptomeningeal collaterals.  MRI: Contraindicated due to recent pacemaker switch, per MRI technician need a minimum of 6 weeks from procedure. (09/25/2020) IR - R M2 inferior division TICI 2c revasc CT head post op 1/25 - unchanged CT repeat 10/09/2020: Subacute right frontoparietal and right temporal infarct.  SAH within the right sylvian fissure is unchanged CT repeat 10/13/2020: Right MCA distribution infarct stable in size.  No evidence for hemorrhagic transformation.  Decrease hypodensity within the right sylvian fissure. 2D Echo -  EF 30 to 35%.  ICD interrogation no afib - new ICD from 09/25/20 LDL 65 HgbA1c: 6.1 VTE prophylaxis -SCDs aspirin 81 mg daily prior to admission, now on aspirin 81 mg daily, started on  10/13/2020 after repeat CT scan Topamax 25 mg twice daily for headache Therapy recommendations: CIR  Disposition: Today to CIR    ROS:   14 system review of systems performed and negative with exception of those listed in HPI  PMH:  Past Medical History:  Diagnosis Date   AICD (automatic cardioverter/defibrillator) present    COPD (chronic obstructive pulmonary disease) (HCC)    Hypothyroidism    Myocardial infarction ([place])    Pacemaker/defibrillator    Stroke ([place])     PSH:  Past Surgical History:  Procedure Laterality Date   ABDOMINAL HYSTERECTOMY     CHOLECYSTECTOMY     INSERT / REPLACE / REMOVE PACEMAKER     IR CT HEAD LTD  10/07/2020   IR PERCUTANEOUS ART THROMBECTOMY/INFUSION INTRACRANIAL INC DIAG ANGIO  10/07/2020   RADIOLOGY WITH ANESTHESIA N/A 10/07/2020   Procedure: IR WITH ANESTHESIA - CODE STROKE;  Surgeon: Radiologist, Medication, MD;  Location: [place];  Service: Radiology;  Laterality: N/A;   THYROIDECTOMY     TONSILLECTOMY      Social History:  Social History   Socioeconomic History   Marital status: Widowed    Spouse name: Not on file   Number of children: Not on file   Years of education: Not on file   Highest education level: Not on file  Occupational History   Not on file  Tobacco Use   Smoking status: Former    Packs/day: 1.00    Years: 15.00    Pack years:  15.00    Types: Cigarettes   Smokeless tobacco: Never  Vaping Use   Vaping Use: Never used  Substance and Sexual Activity   Alcohol use: No   Drug use: No   Sexual activity: Not Currently  Other Topics Concern   Not on file  Social History Narrative   09/17/21 lives with dad ad sister   Social Determinants of Health   Financial Resource Strain: Not on file  Food Insecurity: Not on file  Transportation Needs: Not on file  Physical Activity: Not on file  Stress: Not on file  Social Connections: Not on file  Intimate Partner Violence: Not on file    Family History:  Family  History  Problem Relation Age of Onset   Diabetes Mother    Heart disease Mother 98       stent placed    Macular degeneration Father    Diabetes Brother    Diabetes Brother     Medications:   Current Outpatient Medications on File Prior to Visit  Medication Sig Dispense Refill   amitriptyline (ELAVIL) 50 MG tablet TAKE 1 TABLET BY MOUTH AT BEDTIME 30 tablet 5   apixaban (ELIQUIS) 5 MG TABS tablet Take 1 tablet by mouth twice daily 60 tablet 5   aspirin EC 81 MG tablet Take 81 mg by mouth daily. Swallow whole.     levothyroxine (SYNTHROID) 88 MCG tablet Take 88 mcg by mouth daily.     omeprazole (PRILOSEC) 40 MG capsule Take 40 mg by mouth daily.     spironolactone (ALDACTONE) 25 MG tablet Take 0.5 tablets (12.5 mg total) by mouth daily. 30 tablet 3   atorvastatin (LIPITOR) 40 MG tablet Take 1 tablet by mouth daily. (Patient not taking: Reported on 09/17/2021)     losartan (COZAAR) 25 MG tablet Take 1 tablet by mouth daily. (Patient not taking: Reported on 09/17/2021)     metoprolol succinate (TOPROL XL) 25 MG 24 hr tablet Take 1 tablet (25 mg total) by mouth daily. (Patient not taking: Reported on 09/17/2021) 30 tablet 1   Rimegepant Sulfate (NURTEC) 75 MG TBDP Take 75 mg by mouth daily. (Patient not taking: Reported on 09/17/2021) 6 tablet 0   topiramate (TOPAMAX) 50 MG tablet Take 1 tablet by mouth twice daily (Patient not taking: Reported on 09/17/2021) 60 tablet 0   No current facility-administered medications on file prior to visit.    Allergies:   Allergies  Allergen Reactions   Codeine Shortness Of Breath   Aspirin Other (See Comments)    "burns" the patient's stomach   Latex Other (See Comments)    Makes the "skin peel"    Other Nausea Only and Other (See Comments)    Spicy foods- "Upset the patient's stomach"      OBJECTIVE:  Physical Exam  Vitals:   09/17/21 1016  BP: 138/90  Pulse: 67  Weight: 209 lb 6.4 oz (95 kg)  Height: 5' 6.5" (1.689 m)    Body mass index  is 33.29 kg/m. No results found.   General: well developed, well nourished,  pleasant middle-aged Caucasian female, seated, in no evident distress Head: head normocephalic and atraumatic.   Neck: supple with no carotid or supraclavicular bruits Cardiovascular: regular rate and rhythm, no murmurs Musculoskeletal: no deformity Skin:  no rash/petichiae Vascular:  Normal pulses all extremities   Neurologic Exam Mental Status: Awake and fully alert.   Fluent speech and language.  Oriented to place and time. Recent memory subjectively impaired and remote  memory intact. Attention span, concentration and fund of knowledge appropriate appropriate during visit. Mood and affect appropriate.  Cranial Nerves: Pupils equal, briskly reactive to light. Extraocular movements full without nystagmus. Visual fields full to confrontation. Hearing intact. Facial sensation intact. Face, tongue, palate moves normally and symmetrically.  Motor: Normal strength, bulk and tone on right upper and lower extremity.  LUE: 4+/5 with decreased grip strength and hand dexterity LLE: 5/5 Sensory.:  intact to light touch, vibratory and pinprick sensation Coordination: Rapid alternating movements normal in all extremities except slightly decreased decreased left hand. Finger-to-nose and heel-to-shin performed accurately bilaterally. Gait and Station: Stands from seated position without difficulty.  Gait demonstrates normal stride length and balance without use of assistive device Reflexes: 1+ and symmetric. Toes downgoing.         ASSESSMENT: Rebecca Singh is a 66 y.o. year old female presented with headache, left-sided weakness and left facial droop on 10/07/2020 with stroke work-up revealing acute ischemic right MCA infarct with right M2/MCA posterior division branch occlusion s/p IR with TICI 2C reperfusion, concerning for cardioembolic source given extensive cardiac history. Vascular risk factors include  polysubstance abuse, chronic pain, nonischemic cardiomyopathy (recent EF 30 to 35%), s/p chamber replacement for pacemaker 09/25/2020, new dx of A.fib, history of postpartum stroke, tobacco use, HTN, pre-DM and obesity.      PLAN:  R MCA stroke:  Residual deficit: She has made excellent recovery since prior visit with only residual mild LUE weakness -continue working with OT for hopeful further recovery Vascular headache: Great improvement on amitriptyline 50 mg nightly which is recommended to continue.  Discussed decreasing dosage on topiramate but as she has been doing so well, she is hesitant to change dosage at this time. Will discuss at follow-up visit. Continue Eliquis (apixaban) daily and atorvastatin 40mg  daily for secondary stroke prevention.   Discussed secondary stroke prevention measures and importance of close PCP follow up for aggressive stroke risk factor management  Atrial fibrillation, new diagnosis: dx'd after ICD interrogation.  On Eliquis 5 mg twice daily routinely followed by cardiology HTN: BP goal <130/90.  Stable on current regimen per cardiology HLD: LDL goal<70.  Prior LDL 65.  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  Prior A1c 6.1.  Monitor by PCP Tobacco use: Discussed importance of complete tobacco cessation.  She is hopeful to quit over the next month    Follow up in 6 months or call earlier if needed   CC:  [facility] provider: Dr. Leonie Man PCP: [facility], [place]    I spent 36 minutes of face-to-face and non-face-to-face time with patient and niece.  This included previsit chart review, lab review, study review, electronic health record documentation, patient education and discussion regarding prior stroke and new dx of A fib likely contributing to stroke, secondary stroke prevention measures and importance of aggressive stroke risk factor management, residual deficits, vascular headache and answered all other questions to patient and patient  satisfaction  Frann Rider, AGNP-BC  [facility] [address] [place], [place] [address]  Phone [phone] Fax [phone] Note: This document was prepared with digital dictation and possible smart phrase technology. Any transcriptional errors that result from this process are unintentional.

## 2021-09-17 NOTE — Patient Instructions (Addendum)
Continue amitriptyline 50mg  nightly Restart topiramate at 25mg  twice daily  Please ensure you limit use of tylenol as this can cause rebound (worsening) headaches  Continue Eliquis (apixaban) daily  and atorvastatin for secondary stroke prevention  Continue to follow up with PCP regarding cholesterol and blood pressure management  Maintain strict control of hypertension with blood pressure goal below 130/90, diabetes with hemoglobin A1c goal below 7.0 % and cholesterol with LDL cholesterol (bad cholesterol) goal below 70 mg/dL.   Signs of a Stroke? Follow the BEFAST method:  Balance Watch for a sudden loss of balance, trouble with coordination or vertigo Eyes Is there a sudden loss of vision in one or both eyes? Or double vision?  Face: Ask the person to smile. Does one side of the face droop or is it numb?  Arms: Ask the person to raise both arms. Does one arm drift downward? Is there weakness or numbness of a leg? Speech: Ask the person to repeat a simple phrase. Does the speech sound slurred/strange? Is the person confused ? Time: If you observe any of these signs, call 911.  Ensure you follow up with your primary doctor to discuss medication refills      Followup in the future with me in 6 months or call earlier if needed       Thank you for coming to see at [facility]. I hope we have been able to provide you high quality care today.  You may receive a patient satisfaction survey over the next few weeks. We would appreciate your feedback and comments so that we may continue to improve ourselves and the health of our patients.

## 2021-09-17 NOTE — Progress Notes (Signed)
[facility] [address] [place]. [place] [address] [phone]       STROKE FOLLOW UP NOTE  Rebecca Singh Singh Date of Birth:  [birth year] Medical Record Number:  [id number]   Reason for Referral:  stroke follow up    SUBJECTIVE:   CHIEF COMPLAINT:  Chief Complaint  Patient presents with   Cerebrovascular Accident    Rm 2, 6 month FU "have been out of atorvastatin, losartan, metoprolol- seeing PCP on 09/24/21"      HPI:   Update 09/17/2021 JM:   Returns for 52-month stroke follow-up.  Overall stable.  Denies new stroke/TIA symptoms.  Residual LUE weakness, difficulty with fine motor, weakness and limping with LLE.  Continues to maintain ADLs and majority of IADLs.  Returned back to work back in August without difficulty.    She is unsure what medications she currently takes as her sister Teaching laboratory technician with meds. She has a box containing current meds (aspirin?, eliquis, amitriptyline, aldactone, prilosec, synthroid) and waiting to see PCP next week for refill of atorvastatin, losartan and metoprolol. Blood pressure today 138/90 - does not routinely monitor at home. Remains on amitriptyline for headache prevention - prescribed topamax from PMR but at some point ran out and not currently on - reports she was unable to obtain a refill.  Has headaches 1x weekly that start in her neck and shoot into her left occiput. Improved with Tylenol although taking almost daily 2-3 tablets 3-4x daily. Unknown if she used the Nurtec previously provided as sample. PCP rx'd tizanidine but she does not remember using this - does endorse neck pain as well increased stressors - admits to continued tobacco use. No further concerns at this time.     History provided for reference purposes only Update 03/09/2021 JM: Rebecca Singh Singh returns for 15-month stroke follow-up accompanied by her niece, Rebecca Singh Singh.  She has been doing well since prior visit with improvement of residual deficits with some LUE  weakness -continues to work with OT.  Impairment of her gait and LLE weakness currently ambulating without assistive device and has returned back to driving and majority of IADLs.  She is hopeful to return back to work in September/October as a [employer]. Headaches have improved on amitriptyline 50 mg nightly currently occurring 1x/month usually with increased stressors.  Also remains on topamax 50mg  BID.  Denies new stroke/TIA symptoms.  Since prior visit, she has been diagnosed with atrial fibrillation which was found on pacer interrogation and Eliquis 5 mg twice daily initiated which she has been tolerating without side effects.  She has also remained on atorvastatin 40 mg daily without associated side effects.  Blood pressure today 112/78.  Unfortunately, returned back to tobacco use approx 1 pack every 3-4 days due to increased stressors.  She has a trip planned to [place] next month to stay with her brother for a while and is hopeful that stress levels can be at a minimum and plans on trying to quit smoking again.  No further concerns at this time.  Initial visit 11/25/2020 JM: Rebecca Singh Singh is being seen for hospital follow-up accompanied by her niece, Rebecca Singh Singh  Reports residual left sided weakness with sensory impairment, visual impairment, cognitive slowing, sensory impairment, and gait impairment -currently working with [place] [facility] PT/OT reporting continued improvement Use of RW for short distance and w/c for long distance -denies any recent falls Her greatest concern today is in regards to continued headaches which were present during admission -compliant on topiramate 50 mg  twice daily.  Denies benefit with Fioricet.  FREQUENT use of tylenol - approx 8-9 500mg  tabs/day.  Reports headaches consistently throughout the day associated with photophobia and phonophobia Also struggling with increased anxiety and agitation -underlying history of anxiety/depression not currently on medication  management - feels anxiety related to current medical condition and residual stroke deficits Denies new stroke/TIA symptoms  Compliant on aspirin 81 mg daily and atorvastatin 40 mg daily -denies side effects Blood pressure today 137/92 Reports complete tobacco cessation since hospital discharge  No further concerns at this time   Stroke admission 10/07/2020 Rebecca Singh Singh Singh is a 66 y.o. 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  She presented to [facility] ED on 10/07/2020 with severe headache, left-sided weakness and left facial droop.  Personally reviewed hospitalization pertinent progress notes, lab work and imaging with summary provided.  Initially evaluated by Dr. 10/09/2020 with stroke work-up revealing acute ischemic right MCA stroke with right M2/MCA s/p IR with TICI 2C revascularization, concerning for cardioembolic source given cardiac history.  Unable to obtain MRI due to recent pacemaker switch.  ICD interrogation negative for A. fib.  2D echo showed EF 30 to 35%.  LDL 65.  A1c 6.1.  Current tobacco use of smoking cessation counseling provided.  Other stroke risk factors include nonischemic cardiomyopathy, myopericarditis, reported medication noncompliance, advanced age, obesity and prior stroke history.   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 Code Stroke CT head:  Hypodensity right frontal parietal lobe suspicious for acute infarct. In addition, hyperdense sign at right sylvian fissure MCA CTA head & neck : Right M2/MCA posterior division branch occlusion with moderate distal  branch opacification via leptomeningeal collaterals.  MRI: Contraindicated due to recent pacemaker switch, per MRI technician need a minimum of 6 weeks from procedure. (09/25/2020) IR - R M2 inferior division TICI 2c revasc CT head post op 1/25 - unchanged CT repeat 10/09/2020: Subacute right frontoparietal and right temporal infarct.  SAH within the right sylvian fissure is unchanged CT repeat 10/13/2020: Right MCA distribution infarct stable in size.  No evidence for hemorrhagic transformation.  Decrease hypodensity within the right sylvian fissure. 2D Echo -  EF 30 to 35%.  ICD interrogation no afib - new ICD from 09/25/20 LDL 65 HgbA1c: 6.1 VTE prophylaxis -SCDs aspirin 81 mg daily prior to admission, now on aspirin 81 mg daily, started on 10/13/2020 after repeat CT scan Topamax 25 mg twice daily for headache Therapy recommendations: CIR  Disposition: Today to CIR    ROS:   14 system review of systems performed and negative with exception of those listed in HPI  PMH:  Past Medical History:  Diagnosis Date   AICD (automatic cardioverter/defibrillator) present    COPD (chronic obstructive pulmonary disease) (HCC)    Hypothyroidism    Myocardial infarction (HCC)    Pacemaker/defibrillator    Stroke (HCC)     PSH:  Past Surgical History:  Procedure Laterality Date   ABDOMINAL HYSTERECTOMY     CHOLECYSTECTOMY     INSERT / REPLACE / REMOVE PACEMAKER     IR CT HEAD LTD  10/07/2020   IR PERCUTANEOUS ART THROMBECTOMY/INFUSION  INTRACRANIAL INC DIAG ANGIO  10/07/2020   RADIOLOGY WITH ANESTHESIA N/A 10/07/2020   Procedure: IR WITH ANESTHESIA - CODE STROKE;  Surgeon: Radiologist, Medication, MD;  Location: [facility] OR;  Service: Radiology;  Laterality: N/A;   THYROIDECTOMY     TONSILLECTOMY      Social History:  Social History   Socioeconomic History   Marital status: Widowed    Spouse name: Not on file   Number of children: Not on file   Years of education: Not on file   Highest  education level: Not on file  Occupational History   Not on file  Tobacco Use   Smoking status: Former    Packs/day: 1.00    Years: 15.00    Pack years: 15.00    Types: Cigarettes   Smokeless tobacco: Never  Vaping Use   Vaping Use: Never used  Substance and Sexual Activity   Alcohol use: No   Drug use: No   Sexual activity: Not Currently  Other Topics Concern   Not on file  Social History Narrative   09/17/21 lives with dad ad sister   Social Determinants of Health   Financial Resource Strain: Not on file  Food Insecurity: Not on file  Transportation Needs: Not on file  Physical Activity: Not on file  Stress: Not on file  Social Connections: Not on file  Intimate Partner Violence: Not on file    Family History:  Family History  Problem Relation Age of Onset   Diabetes Mother    Heart disease Mother 77       stent placed    Macular degeneration Father    Diabetes Brother    Diabetes Brother     Medications:   Current Outpatient Medications on File Prior to Visit  Medication Sig Dispense Refill   amitriptyline (ELAVIL) 50 MG tablet TAKE 1 TABLET BY MOUTH AT BEDTIME 30 tablet 5   apixaban (ELIQUIS) 5 MG TABS tablet Take 1 tablet by mouth twice daily 60 tablet 5   aspirin EC 81 MG tablet Take 81 mg by mouth daily. Swallow whole.     levothyroxine (SYNTHROID) 88 MCG tablet Take 88 mcg by mouth daily.     omeprazole (PRILOSEC) 40 MG capsule Take 40 mg by mouth daily.     spironolactone (ALDACTONE) 25 MG tablet Take 0.5 tablets (12.5 mg total) by mouth daily. 30 tablet 3   atorvastatin (LIPITOR) 40 MG tablet Take 1 tablet by mouth daily. (Patient not taking: Reported on 09/17/2021)     losartan (COZAAR) 25 MG tablet Take 1 tablet by mouth daily. (Patient not taking: Reported on 09/17/2021)     metoprolol succinate (TOPROL XL) 25 MG 24 hr tablet Take 1 tablet (25 mg total) by mouth daily. (Patient not taking: Reported on 09/17/2021) 30 tablet 1   Rimegepant Sulfate (NURTEC) 75  MG TBDP Take 75 mg by mouth daily. (Patient not taking: Reported on 09/17/2021) 6 tablet 0   topiramate (TOPAMAX) 50 MG tablet Take 1 tablet by mouth twice daily (Patient not taking: Reported on 09/17/2021) 60 tablet 0   No current facility-administered medications on file prior to visit.    Allergies:   Allergies  Allergen Reactions   Codeine Shortness Of Breath   Aspirin Other (See Comments)    "burns" the patient's stomach   Latex Other (See Comments)    Makes the "skin peel"    Other Nausea Only and Other (See Comments)    Spicy foods- "Upset the patient's stomach"  OBJECTIVE:  Physical Exam  Vitals:   09/17/21 1016  BP: 138/90  Pulse: 67  Weight: 209 lb 6.4 oz (95 kg)  Height: 5' 6.5" (1.689 m)   Body mass index is 33.29 kg/m. No results found.  General: well developed, well nourished,  pleasant middle-aged Caucasian female, seated, in no evident distress Head: head normocephalic and atraumatic.   Neck: supple with no carotid or supraclavicular bruits Cardiovascular: regular rate and rhythm, no murmurs Musculoskeletal: no deformity Skin:  no rash/petichiae Vascular:  Normal pulses all extremities   Neurologic Exam Mental Status: Awake and fully alert.  Fluent speech and language.  Oriented to place and time. Recent memory subjectively impaired and remote memory intact. Attention span, concentration and fund of knowledge appropriate appropriate during visit. Mood and affect appropriate.  Cranial Nerves: Pupils equal, briskly reactive to light. Extraocular movements full without nystagmus. Visual fields full to confrontation. Hearing intact. Facial sensation intact. Face, tongue, palate moves normally and symmetrically.  Motor: Normal strength, bulk and tone on right upper and lower extremity.  LUE: 4+/5 with decreased grip strength and hand dexterity although giveaway weakness noted LLE: 5/5 with giveaway weakness noted Sensory.:  intact to light touch, vibratory  and pinprick sensation Coordination: Rapid alternating movements normal in all extremities except slightly decreased decreased left hand. Finger-to-nose and heel-to-shin performed accurately bilaterally. Gait and Station: Stands from seated position without difficulty.  Gait demonstrates exaggerated favoring of left leg but with distraction, only minimal gait abnormality.  Difficulty performing tandem walking heel toe. Reflexes: 1+ and symmetric. Toes downgoing.         ASSESSMENT: Rebecca Singh Singh is a 66 y.o. year old female presented with headache, left-sided weakness and left facial droop on 10/07/2020 with stroke work-up revealing acute ischemic right MCA infarct with right M2/MCA posterior division branch occlusion s/p IR with TICI 2C reperfusion, concerning for cardioembolic source given extensive cardiac history. Vascular risk factors include polysubstance abuse, chronic pain, nonischemic cardiomyopathy (recent EF 30 to 35%), s/p chamber replacement for pacemaker 09/25/2020, new dx of A.fib, history of postpartum stroke, tobacco use, HTN, pre-DM and obesity.      PLAN:  R MCA stroke:  Residual deficit: left sided weakness although difficulty testing due to giveaway weakness and mild gait impairment - returned back to work without difficulty  Vascular headache: stable although slight increase since prior visit likely due to stopping topiramate.  Recommend restarting topiramate at 25 mg twice daily and continue amitriptyline 50 mg nightly. Discussed limiting acetaminophen use due to likely analgesic rebound contributing to increased headaches Continue Eliquis (apixaban) daily and atorvastatin  daily for secondary stroke prevention.  Advised to discontinue aspirin as not indicated Discussed secondary stroke prevention measures and importance of close PCP follow up for aggressive stroke risk factor management including HTN with BP goal<130/90, HLD with LDL goal<70 and pre-DM with A1c  goal<7 - encouraged reaching out to PCP for med refills but plans to f/u with PCP next week for med refills and lab work. Encouraged she start assisting with medications as she was does not know what she is currently taking as he sister manages - discussed importance of knowing medications she is taking and to take all medications as prescribed  Atrial fibrillation, new diagnosis: dx'd after ICD interrogation.  On Eliquis 5 mg twice daily routinely followed by cardiology Tobacco use: Discussed importance of complete tobacco cessation. Advised f/u with PCP for assistance    Follow up in 6 months or call earlier if needed  CC:  PCP: [facility]    I spent 38 minutes of face-to-face and non-face-to-face time with patient.  This included previsit chart review, lab review, study review, order entry, electronic health record documentation, patient education and discussion regarding prior stroke with residual deficits, secondary stroke prevention measures and importance of aggressive stroke risk factor management, vascular headache and tx options, prolonged discussion re: current medications and answered all other questions to patients satisfaction  Ihor Austin, AGNP-BC  [facility] [address] [place], [place] [address]  Phone [phone] Fax [phone] Note: This document was prepared with digital dictation and possible smart phrase technology. Any transcriptional errors that result from this process are unintentional.

## 2021-09-21 ENCOUNTER — Ambulatory Visit: Payer: Medicare HMO | Admitting: Adult Health

## 2021-10-14 ENCOUNTER — Ambulatory Visit (INDEPENDENT_AMBULATORY_CARE_PROVIDER_SITE_OTHER): Payer: 59

## 2021-10-14 ENCOUNTER — Other Ambulatory Visit: Payer: Self-pay

## 2021-10-14 DIAGNOSIS — I428 Other cardiomyopathies: Secondary | ICD-10-CM

## 2021-10-14 MED ORDER — SPIRONOLACTONE 25 MG PO TABS: 12.5000 mg | ORAL_TABLET | Freq: Every day | ORAL | 3 refills | Status: DC

## 2021-10-16 LAB — CUP PACEART REMOTE DEVICE CHECK
Battery Remaining Longevity: 84 mo
Battery Remaining Percentage: 96 %
Brady Statistic RA Percent Paced: 13 %
Brady Statistic RV Percent Paced: 93 %
Date Time Interrogation Session: 20230202153900
HighPow Impedance: 49 Ohm
Implantable Lead Implant Date: 20150301
Implantable Lead Implant Date: 20150301
Implantable Lead Implant Date: 20150301
Implantable Lead Location: 753858
Implantable Lead Location: 753859
Implantable Lead Location: 753860
Implantable Lead Model: 4470
Implantable Lead Model: 4677
Implantable Lead Model: 696
Implantable Lead Serial Number: 145508
Implantable Lead Serial Number: 501050
Implantable Lead Serial Number: 769161
Implantable Pulse Generator Implant Date: 20220113
Lead Channel Impedance Value: 307 Ohm
Lead Channel Impedance Value: 416 Ohm
Lead Channel Impedance Value: 434 Ohm
Lead Channel Setting Pacing Amplitude: 2 V
Lead Channel Setting Pacing Amplitude: 2.8 V
Lead Channel Setting Pacing Amplitude: 3 V
Lead Channel Setting Pacing Pulse Width: 0.5 ms
Lead Channel Setting Pacing Pulse Width: 1 ms
Lead Channel Setting Sensing Sensitivity: 0.6 mV
Lead Channel Setting Sensing Sensitivity: 1 mV
Pulse Gen Serial Number: 267431

## 2021-10-21 NOTE — Progress Notes (Signed)
Remote ICD transmission.   

## 2022-01-13 ENCOUNTER — Ambulatory Visit (INDEPENDENT_AMBULATORY_CARE_PROVIDER_SITE_OTHER): Payer: 59

## 2022-01-13 DIAGNOSIS — I428 Other cardiomyopathies: Secondary | ICD-10-CM

## 2022-01-14 LAB — CUP PACEART REMOTE DEVICE CHECK
Battery Remaining Longevity: 84 mo
Battery Remaining Percentage: 95 %
Brady Statistic RA Percent Paced: 13 %
Brady Statistic RV Percent Paced: 95 %
Date Time Interrogation Session: 20230504025200
HighPow Impedance: 54 Ohm
Implantable Lead Implant Date: 20150301
Implantable Lead Implant Date: 20150301
Implantable Lead Implant Date: 20150301
Implantable Lead Location: 753858
Implantable Lead Location: 753859
Implantable Lead Location: 753860
Implantable Lead Model: 4470
Implantable Lead Model: 4677
Implantable Lead Model: 696
Implantable Lead Serial Number: 145508
Implantable Lead Serial Number: 501050
Implantable Lead Serial Number: 769161
Implantable Pulse Generator Implant Date: 20220113
Lead Channel Impedance Value: 328 Ohm
Lead Channel Impedance Value: 440 Ohm
Lead Channel Impedance Value: 464 Ohm
Lead Channel Setting Pacing Amplitude: 2 V
Lead Channel Setting Pacing Amplitude: 2.8 V
Lead Channel Setting Pacing Amplitude: 3 V
Lead Channel Setting Pacing Pulse Width: 0.5 ms
Lead Channel Setting Pacing Pulse Width: 1 ms
Lead Channel Setting Sensing Sensitivity: 0.6 mV
Lead Channel Setting Sensing Sensitivity: 1 mV
Pulse Gen Serial Number: 267431

## 2022-01-27 NOTE — Progress Notes (Signed)
Remote ICD transmission.   

## 2022-02-16 ENCOUNTER — Ambulatory Visit (INDEPENDENT_AMBULATORY_CARE_PROVIDER_SITE_OTHER): Payer: 59 | Admitting: Cardiology

## 2022-02-16 ENCOUNTER — Encounter: Payer: Self-pay | Admitting: Cardiology

## 2022-02-16 VITALS — BP 120/84 | HR 67 | Ht 67.0 in | Wt 212.2 lb

## 2022-02-16 DIAGNOSIS — I428 Other cardiomyopathies: Secondary | ICD-10-CM | POA: Diagnosis not present

## 2022-02-16 DIAGNOSIS — I480 Paroxysmal atrial fibrillation: Secondary | ICD-10-CM | POA: Diagnosis not present

## 2022-02-16 DIAGNOSIS — Z95810 Presence of automatic (implantable) cardiac defibrillator: Secondary | ICD-10-CM

## 2022-02-16 MED ORDER — SPIRONOLACTONE 25 MG PO TABS: 25.0000 mg | ORAL_TABLET | Freq: Every day | ORAL | 3 refills | Status: DC

## 2022-02-16 NOTE — Patient Instructions (Signed)
Medication Instructions:   Your physician has recommended you make the following change in your medication:    INCREASE your Spironolactone (Aldactone) to 25 MG once a day.   *If you need a refill on your cardiac medications before your next appointment, please call your pharmacy*   Testing/Procedures:  Your physician has requested that you have a limited echocardiogram in 3 months. Echocardiography is a painless test that uses sound waves to create images of your heart. It provides your doctor with information about the size and shape of your heart and how well your heart's chambers and valves are working. This procedure takes approximately one hour. There are no restrictions for this procedure.    Follow-Up: At [facility], you and your health needs are our priority.  As part of our continuing mission to provide you with exceptional heart care, we have created designated Provider Care Teams.  These Care Teams include your primary Cardiologist (physician) and Advanced Practice Providers (APPs -  Physician Assistants and Nurse Practitioners) who all work together to provide you with the care you need, when you need it.  We recommend signing up for the patient portal called "MyChart".  Sign up information is provided on this After Visit Summary.  MyChart is used to connect with patients for Virtual Visits (Telemedicine).  Patients are able to view lab/test results, encounter notes, upcoming appointments, etc.  Non-urgent messages can be sent to your provider as well.   To learn more about what you can do with MyChart, go to [email or web address].    Your next appointment:   4 month(s)  The format for your next appointment:   In Person  Provider:   You may see Kate Sable, MD or one of the following Advanced Practice Providers on your designated Care Team:   Murray Hodgkins, NP Christell Faith, PA-C Cadence Kathlen Mody, [place]    Other Instructions   Important Information About  Sugar
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
## 2022-03-06 ENCOUNTER — Other Ambulatory Visit: Payer: Self-pay | Admitting: Adult Health

## 2022-03-22 ENCOUNTER — Encounter: Payer: Self-pay | Admitting: Cardiology

## 2022-03-23 ENCOUNTER — Encounter: Payer: Self-pay | Admitting: Adult Health

## 2022-03-23 ENCOUNTER — Ambulatory Visit (INDEPENDENT_AMBULATORY_CARE_PROVIDER_SITE_OTHER): Payer: Medicare Other | Admitting: Adult Health

## 2022-03-23 VITALS — BP 125/80 | HR 75 | Ht 66.0 in | Wt 213.0 lb

## 2022-03-23 DIAGNOSIS — I63411 Cerebral infarction due to embolism of right middle cerebral artery: Secondary | ICD-10-CM | POA: Diagnosis not present

## 2022-03-23 DIAGNOSIS — G441 Vascular headache, not elsewhere classified: Secondary | ICD-10-CM

## 2022-03-23 NOTE — Progress Notes (Signed)
[facility] [address] [place] [address] [phone]       STROKE FOLLOW UP NOTE  [name] Date of Birth:  1956/05/11 Medical Record Number:  [id number]   Reason for Referral:  stroke follow up    SUBJECTIVE:   CHIEF COMPLAINT:  Chief Complaint  Patient presents with   Follow-up    RM 2 alone Pt is well and stable, no new stroke concerns. Headaches have improved, feels they are stress related.      HPI:   Update 03/23/2022 JM: Patient returns for 33-month stroke follow-up unaccompanied .  Overall stable without new stroke/TIA symptoms.  Reports residual left arm weakness but improvement since prior visit, occasional LLE "slowness" but denies weakness.  Continues to maintain ADLs and IADLs apparently as well as working without difficulty.   Headaches have improved since prior visit and now typically only present with increased stress, has continued on topamax 25mg  twice daily and amitriptyline 50 mg daily, denies side effects.   Compliant on Eliquis and atorvastatin, denies side effects.  Blood pressure today 125/80.  Routinely followed by PCP and cardiology.  No further concerns at this time      History provided for reference purposes only Update 09/17/2021 JM:   Returns for 22-month stroke follow-up.  Overall stable.  Denies new stroke/TIA symptoms.  Residual LUE weakness, difficulty with fine motor, weakness and limping with LLE.  Continues to maintain ADLs and majority of IADLs.  Returned back to work back in August without difficulty.    She is unsure what medications she currently takes as her sister September with meds. She has a box containing current meds (aspirin?, eliquis, amitriptyline, aldactone, prilosec, synthroid) and waiting to see PCP next week for refill of atorvastatin, losartan and metoprolol. Blood pressure today 138/90 - does not routinely monitor at home. Remains on amitriptyline for headache prevention -  prescribed topamax from PMR but at some point ran out and not currently on - reports she was unable to obtain a refill.  Has headaches 1x weekly that start in her neck and shoot into her left occiput. Improved with Tylenol although taking almost daily 2-3 tablets 3-4x daily. Unknown if she used the Nurtec previously provided as sample. PCP rx'd tizanidine but she does not remember using this - does endorse neck pain as well increased stressors - admits to continued tobacco use. No further concerns at this time.    Update 03/09/2021 JM: Rebecca Singh returns for 8-month stroke follow-up accompanied by her niece, 2-month.  She has been doing well since prior visit with improvement of residual deficits with some LUE weakness -continues to work with OT.  Impairment of her gait and LLE weakness currently ambulating without assistive device and has returned back to driving and majority of IADLs.  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 Headaches have improved on amitriptyline 50 mg nightly currently occurring 1x/month usually with increased stressors.  Also remains on topamax 50mg  BID.  Denies new stroke/TIA symptoms.  Since prior visit, she has been diagnosed with atrial fibrillation which was found on pacer interrogation and Eliquis 5 mg twice daily initiated which she has been tolerating without side effects.  She has also remained on atorvastatin 40 mg daily without associated side effects.  Blood pressure today 112/78.  Unfortunately, returned back to tobacco use approx 1 pack every 3-4 days due to increased stressors.  She has a trip planned to  next month to stay with her brother for a while and is hopeful that stress levels can be at a minimum and plans on trying to quit smoking again.  No further concerns at this time.  Initial visit 11/25/2020 JM: Rebecca Singh is being seen for hospital follow-up accompanied by her niece, [name]  Reports residual left sided  weakness with sensory impairment, visual impairment, cognitive slowing, sensory impairment, and gait impairment -currently working with [facility] reporting continued improvement Use of RW for short distance and w/c for long distance -denies any recent falls Her greatest concern today is in regards to continued headaches which were present during admission -compliant on topiramate 50 mg twice daily.  Denies benefit with Fioricet.  FREQUENT use of tylenol - approx 8-9 500mg  tabs/day.  Reports headaches consistently throughout the day associated with photophobia and phonophobia Also struggling with increased anxiety and agitation -underlying history of anxiety/depression not currently on medication management - feels anxiety related to current medical condition and residual stroke deficits Denies new stroke/TIA symptoms  Compliant on aspirin 81 mg daily and atorvastatin 40 mg daily -denies side effects Blood pressure today 137/92 Reports complete tobacco cessation since hospital discharge  No further concerns at this time   Stroke admission 10/07/2020 Rebecca Singh is a 66 y.o. 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  She presented to [facility] ED on 10/07/2020 with severe headache, left-sided weakness and left facial droop.  Personally reviewed hospitalization pertinent progress notes, lab work and imaging with summary provided.  Initially evaluated by Dr. 10/09/2020 with stroke work-up revealing acute ischemic right MCA stroke with right M2/MCA s/p IR with TICI 2C revascularization, concerning for cardioembolic source given cardiac history.  Unable to  obtain MRI due to recent pacemaker switch.  ICD interrogation negative for A. fib.  2D echo showed EF 30 to 35%.  LDL 65.  A1c 6.1.  Current tobacco use of smoking cessation counseling provided.  Other stroke risk factors include nonischemic cardiomyopathy, myopericarditis, reported medication noncompliance, advanced age, obesity and prior stroke history.   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 Code Stroke CT head:  Hypodensity right frontal parietal lobe suspicious for acute infarct. In addition, hyperdense sign at right sylvian fissure MCA CTA head & neck : Right M2/MCA posterior division branch occlusion with moderate distal branch opacification via leptomeningeal collaterals.  MRI: Contraindicated due to recent pacemaker switch, per MRI technician need a minimum of 6 weeks from procedure. (09/25/2020) IR - R M2 inferior division TICI 2c revasc CT head post op 1/25 - unchanged CT repeat 10/09/2020: Subacute right frontoparietal and right temporal infarct.  SAH within the right sylvian fissure is unchanged CT repeat 10/13/2020: Right MCA distribution infarct stable in size.  No evidence for hemorrhagic transformation.  Decrease hypodensity within the right sylvian fissure. 2D Echo -  EF 30 to 35%.  ICD interrogation no afib - new ICD from 09/25/20 LDL 65 HgbA1c: 6.1 VTE prophylaxis -SCDs aspirin 81 mg daily prior to admission, now on aspirin 81 mg daily, started on 10/13/2020 after repeat CT scan Topamax 25 mg twice daily for headache Therapy recommendations: CIR  Disposition: Today to CIR    ROS:   14 system review of systems performed and negative with exception of those listed in HPI  PMH:  Past Medical History:  Diagnosis Date   AICD (automatic cardioverter/defibrillator) present    COPD (chronic obstructive pulmonary disease) (HCC)    Hypothyroidism    Myocardial infarction (HCC)     Pacemaker/defibrillator    Stroke (HCC)     PSH:  Past Surgical History:  Procedure Laterality Date   ABDOMINAL HYSTERECTOMY     CHOLECYSTECTOMY     INSERT / REPLACE / REMOVE PACEMAKER     IR CT HEAD LTD  10/07/2020   IR PERCUTANEOUS ART THROMBECTOMY/INFUSION INTRACRANIAL INC DIAG ANGIO  10/07/2020   RADIOLOGY WITH ANESTHESIA N/A 10/07/2020   Procedure: IR WITH ANESTHESIA - CODE STROKE;  Surgeon: Radiologist, Medication, MD;  Location: [facility] OR;  Service: Radiology;  Laterality: N/A;   THYROIDECTOMY     TONSILLECTOMY      Social History:  Social History   Socioeconomic History   Marital status: Widowed    Spouse name: Not on file   Number of children: Not on file   Years of education: Not on file   Highest education level: Not on file  Occupational History   Not on file  Tobacco Use   Smoking status: Former    Packs/day: 1.00    Years: 15.00    Total pack years: 15.00    Types: Cigarettes   Smokeless tobacco: Never  Vaping Use   Vaping Use: Never used  Substance and Sexual Activity   Alcohol use: No   Drug use: No   Sexual activity: Not Currently  Other Topics Concern   Not on file  Social History Narrative   09/17/21 lives with dad ad sister   Social Determinants of Health   Financial Resource Strain: Not on file  Food Insecurity: Not on file  Transportation Needs: Not on file  Physical Activity: Not on file  Stress: Not on file  Social Connections: Not on file  Intimate Partner Violence: Not on file    Family History:  Family History  Problem Relation Age of Onset   Diabetes Mother    Heart disease Mother 106       stent placed    Macular degeneration Father    Diabetes Brother    Diabetes Brother     Medications:   Current Outpatient Medications on File Prior to Visit  Medication Sig Dispense Refill   amitriptyline (ELAVIL) 50 MG tablet Take 1 tablet (50 mg total) by mouth at bedtime. 30 tablet 5   apixaban (ELIQUIS) 5 MG TABS tablet Take 1 tablet by  mouth twice daily 60 tablet 5   atorvastatin (LIPITOR) 40 MG tablet Take 1 tablet by mouth daily.     levothyroxine (SYNTHROID) 88 MCG tablet Take 88 mcg by mouth daily.     losartan (COZAAR) 25 MG tablet Take 1 tablet by mouth daily.     metoprolol succinate (TOPROL XL) 25 MG 24 hr tablet Take 1 tablet (25 mg total) by mouth daily. 30 tablet 1   omeprazole (PRILOSEC) 40 MG capsule Take 40 mg by mouth daily.     spironolactone (ALDACTONE) 25 MG tablet Take 1 tablet (25 mg total) by mouth daily. 30 tablet 3   topiramate (TOPAMAX) 25 MG tablet Take 1 tablet by mouth twice daily 60 tablet 5   No current facility-administered medications on file prior to visit.    Allergies:  Allergies  Allergen Reactions   Codeine Shortness Of Breath   Aspirin Other (See Comments)    "burns" the patient's stomach   Latex Other (See Comments)    Makes the "skin peel"    Other Nausea Only and Other (See Comments)    Spicy foods- "Upset the patient's stomach"      OBJECTIVE:  Physical Exam  Vitals:   03/23/22 1033  BP: 125/80  Pulse: 75  Weight: 213 lb (96.6 kg)  Height: 5\' 6"  (1.676 m)   Body mass index is 34.38 kg/m. No results found.   General: well developed, well nourished,  pleasant middle-aged Caucasian female, seated, in no evident distress Head: head normocephalic and atraumatic.   Neck: supple with no carotid or supraclavicular bruits Cardiovascular: regular rate and rhythm, no murmurs Musculoskeletal: no deformity Skin:  no rash/petichiae Vascular:  Normal pulses all extremities   Neurologic Exam Mental Status: Awake and fully alert.  Fluent speech and language.  Oriented to place and time. Recent memory subjectively impaired and remote memory intact. Attention span, concentration and fund of knowledge appropriate appropriate during visit. Mood and affect appropriate.  Cranial Nerves: Pupils equal, briskly reactive to light. Extraocular movements full without nystagmus.  Visual fields full to confrontation. Hearing intact. Facial sensation intact. Face, tongue, palate moves normally and symmetrically.  Motor: Normal strength, bulk and tone on right upper and lower extremity.  LUE: 4+/5 with decreased grip strength and hand dexterity LLE: 5/5 Sensory.:  intact to light touch, vibratory and pinprick sensation Coordination: Rapid alternating movements normal in all extremities except slightly decreased decreased left hand. Finger-to-nose and heel-to-shin performed accurately bilaterally. Gait and Station: Stands from seated position without difficulty.  Gait demonstrates normal stride length and balance without use of assistive device.   Reflexes: 1+ and symmetric. Toes downgoing.         ASSESSMENT: Rebecca Singh is a 66 y.o. year old female presented with headache, left-sided weakness and left facial droop on 10/07/2020 with stroke work-up revealing acute ischemic right MCA infarct with right M2/MCA posterior division branch occlusion s/p IR with TICI 2C reperfusion, concerning for cardioembolic source given extensive cardiac history. Vascular risk factors include polysubstance abuse, chronic pain, nonischemic cardiomyopathy (recent EF 30 to 35%), s/p chamber replacement for pacemaker 09/25/2020, new dx of A.fib, history of postpartum stroke, tobacco use, HTN, pre-DM and obesity.      PLAN:  R MCA stroke:  Residual deficit: mild left sided deficits. Stable.  Vascular headache: Well-controlled,  continue amitriptyline 50 mg daily and topiramate 25 mg twice daily.  Can continue decreasing dosage at follow-up visit if remains stable.   Continue Eliquis (apixaban) daily and atorvastatin 40mg  daily for secondary stroke prevention.   Discussed secondary stroke prevention measures and importance of close PCP follow up for aggressive stroke risk factor management including HTN with BP goal<130/90, HLD with LDL goal<70 and pre-DM with A1c goal<7 - encouraged  reaching out to PCP for med refills but plans to f/u with PCP next week for med refills and lab work. Encouraged she start assisting with medications as she was does not know what she is currently taking as her sister manages - discussed importance of knowing medications she is taking and to take all medications as prescribed  Atrial fibrillation: dx'd after ICD interrogation.  On Eliquis 5 mg twice daily routinely followed by cardiology Tobacco use: Discussed importance of complete tobacco cessation. Advised f/u with PCP for assistance    Follow up in 6  months or call earlier if needed   CC:  PCP: Ennis Forts, MD    I spent 31 minutes of face-to-face and non-face-to-face time with patient.  This included previsit chart review, lab review, study review, electronic health record documentation, patient education and discussion regarding prior stroke with minimal residual deficits, secondary stroke prevention measures and importance of aggressive stroke risk factor management, vascular headache, and answered all other questions to patients satisfaction  Ihor Austin, AGNP-BC  [facility] [address] [place], [place] [address]  Phone [phone] Fax [phone] Note: This document was prepared with digital dictation and possible smart phrase technology. Any transcriptional errors that result from this process are unintentional.

## 2022-03-23 NOTE — Patient Instructions (Addendum)
Continue Elavil and Topamax for headaches  Continue Eliquis (apixaban) daily  and atorvastatin  for secondary stroke prevention  Continue to follow up with PCP regarding cholesterol and blood pressure management  Maintain strict control of hypertension with blood pressure goal below 130/90 and cholesterol with LDL cholesterol (bad cholesterol) goal below 70 mg/dL.   Signs of a Stroke? Follow the BEFAST method:  Balance Watch for a sudden loss of balance, trouble with coordination or vertigo Eyes Is there a sudden loss of vision in one or both eyes? Or double vision?  Face: Ask the person to smile. Does one side of the face droop or is it numb?  Arms: Ask the person to raise both arms. Does one arm drift downward? Is there weakness or numbness of a leg? Speech: Ask the person to repeat a simple phrase. Does the speech sound slurred/strange? Is the person confused ? Time: If you observe any of these signs, call 911.    Follow up in 6 months or call earlier if needed    Thank you for coming to see [place] at [facility]. I hope we have been able to provide you high quality care today.  You may receive a patient satisfaction survey over the next few weeks. We would appreciate your feedback and comments so that we may continue to improve ourselves and the health of our patients.

## 2022-04-14 ENCOUNTER — Ambulatory Visit (INDEPENDENT_AMBULATORY_CARE_PROVIDER_SITE_OTHER): Payer: Medicare HMO

## 2022-04-14 DIAGNOSIS — I428 Other cardiomyopathies: Secondary | ICD-10-CM

## 2022-04-16 LAB — CUP PACEART REMOTE DEVICE CHECK
Battery Remaining Longevity: 78 mo
Battery Remaining Percentage: 91 %
Brady Statistic RA Percent Paced: 13 %
Brady Statistic RV Percent Paced: 95 %
Date Time Interrogation Session: 20230803191400
HighPow Impedance: 50 Ohm
Implantable Lead Implant Date: 20150301
Implantable Lead Implant Date: 20150301
Implantable Lead Implant Date: 20150301
Implantable Lead Location: 753858
Implantable Lead Location: 753859
Implantable Lead Location: 753860
Implantable Lead Model: 4470
Implantable Lead Model: 4677
Implantable Lead Model: 696
Implantable Lead Serial Number: 145508
Implantable Lead Serial Number: 501050
Implantable Lead Serial Number: 769161
Implantable Pulse Generator Implant Date: 20220113
Lead Channel Impedance Value: 297 Ohm
Lead Channel Impedance Value: 420 Ohm
Lead Channel Impedance Value: 457 Ohm
Lead Channel Setting Pacing Amplitude: 2 V
Lead Channel Setting Pacing Amplitude: 2.8 V
Lead Channel Setting Pacing Amplitude: 3 V
Lead Channel Setting Pacing Pulse Width: 0.5 ms
Lead Channel Setting Pacing Pulse Width: 1 ms
Lead Channel Setting Sensing Sensitivity: 0.6 mV
Lead Channel Setting Sensing Sensitivity: 1 mV
Pulse Gen Serial Number: 267431

## 2022-05-07 NOTE — Progress Notes (Signed)
Remote ICD transmission.   

## 2022-05-17 ENCOUNTER — Other Ambulatory Visit: Payer: Self-pay | Admitting: Cardiology

## 2022-05-18 NOTE — Telephone Encounter (Signed)
Prescription refill request for Eliquis received. Indication:cva Last office visit:6/23 Scr:0.7 Age: 66 Weight:96.6 kg  Prescription refilled

## 2022-05-19 ENCOUNTER — Ambulatory Visit: Payer: Medicare Other | Attending: Cardiology

## 2022-05-19 DIAGNOSIS — I428 Other cardiomyopathies: Secondary | ICD-10-CM | POA: Diagnosis not present

## 2022-05-19 LAB — ECHOCARDIOGRAM LIMITED
Area-P 1/2: 2.82 cm2
S' Lateral: 4.3 cm
Single Plane A4C EF: 33.9 %

## 2022-05-19 MED ORDER — PERFLUTREN LIPID MICROSPHERE
1.0000 mL | INTRAVENOUS | Status: AC | PRN
  Administered 2022-05-19: 2 mL via INTRAVENOUS

## 2022-06-15 NOTE — Progress Notes (Unsigned)
Electrophysiology Office Follow up Visit Note:    Date:  06/16/2022   ID:  Rebecca Singh, DOB 06-Jan-1956, MRN [id number]  PCP:  Delena Serve, MD  [facility] Cardiologist:  Kate Sable, MD  [facility] Electrophysiologist:  Vickie Epley, MD    Interval History:    Rebecca Singh is a 66 y.o. female who presents for a follow up visit. They were last seen in clinic April 22, 2021.    From my last office note: Rebecca Singh is a 66 y.o. female who presents for a follow up visit. They were last seen in clinic Jan 14, 2021 to establish care for pacemaker.  She has a CRT-D implanted.  She was previously followed at [facility].  At that appointment we diagnosed new atrial fibrillation and started her on Eliquis for stroke prophylaxis.    Today she presents for follow-up.  Since her last appointment, she has done well.  She briefly stopped smoking but gained a lot of weight so she picked back up her cigarettes.  She is smoking less than 1 pack/day.  No problems with her defibrillator.      Past Medical History:  Diagnosis Date   AICD (automatic cardioverter/defibrillator) present    COPD (chronic obstructive pulmonary disease) (HCC)    Hypothyroidism    Myocardial infarction ([place])    Pacemaker/defibrillator    Stroke [facility])     Past Surgical History:  Procedure Laterality Date   ABDOMINAL HYSTERECTOMY     CHOLECYSTECTOMY     INSERT / REPLACE / REMOVE PACEMAKER     IR CT HEAD LTD  10/07/2020   IR PERCUTANEOUS ART THROMBECTOMY/INFUSION INTRACRANIAL INC DIAG ANGIO  10/07/2020   RADIOLOGY WITH ANESTHESIA N/A 10/07/2020   Procedure: IR WITH ANESTHESIA - CODE STROKE;  Surgeon: Radiologist, Medication, MD;  Location: [place];  Service: Radiology;  Laterality: N/A;   THYROIDECTOMY     TONSILLECTOMY      Current Medications: Current Meds  Medication Sig   amitriptyline (ELAVIL) 50 MG tablet Take 1 tablet (50 mg total) by mouth at bedtime.   apixaban  (ELIQUIS) 5 MG TABS tablet Take 1 tablet by mouth twice daily   levothyroxine (SYNTHROID) 88 MCG tablet Take 88 mcg by mouth daily.   metoprolol succinate (TOPROL XL) 25 MG 24 hr tablet Take 1 tablet (25 mg total) by mouth daily.   omeprazole (PRILOSEC) 40 MG capsule Take 40 mg by mouth daily.   spironolactone (ALDACTONE) 25 MG tablet Take 1 tablet (25 mg total) by mouth daily.   topiramate (TOPAMAX) 25 MG tablet Take 1 tablet by mouth twice daily     Allergies:   Codeine, Aspirin, Latex, and Other   Social History   Socioeconomic History   Marital status: Widowed    Spouse name: Not on file   Number of children: Not on file   Years of education: Not on file   Highest education level: Not on file  Occupational History   Not on file  Tobacco Use   Smoking status: Former    Packs/day: 1.00    Years: 15.00    Total pack years: 15.00    Types: Cigarettes   Smokeless tobacco: Never  Vaping Use   Vaping Use: Never used  Substance and Sexual Activity   Alcohol use: No   Drug use: No   Sexual activity: Not Currently  Other Topics Concern   Not on file  Social History Narrative   09/17/21 lives  with dad ad sister   Social Determinants of Health   Financial Resource Strain: Not on file  Food Insecurity: Not on file  Transportation Needs: Not on file  Physical Activity: Not on file  Stress: Not on file  Social Connections: Not on file     Family History: The patient's family history includes Diabetes in her brother, brother, and mother; Heart disease (age of onset: 50) in her mother; Macular degeneration in her father.  ROS:   Please see the history of present illness.    All other systems reviewed and are negative.  EKGs/Labs/Other Studies Reviewed:    The following studies were reviewed today:  June 16, 2022 in clinic device interrogation personally reviewed Battery longevity 6 years Atrial pacing threshold 3 V at 1.5 ms.  I have reprogrammed this to 3.5 V at 1.5  ms.  She has an underlying sinus rhythm.  She atrial paces 13%. She ventricular paces 99% The right ventricular and left ventricular leads have stable lead parameters.  I reprogrammed her lower rate limit of 40 bpm to minimize atrial pacing and hopefully extend battery longevity. Less than 1% AF burden  May 19, 2022 Limited echo EF 35 to 40% Global hypokinesis Normal RV    Recent Labs: No results found for requested labs within last 365 days.  Recent Lipid Panel    Component Value Date/Time   CHOL 124 10/08/2020 0443   TRIG 52 10/08/2020 0443   HDL 49 10/08/2020 0443   CHOLHDL 2.5 10/08/2020 0443   VLDL 10 10/08/2020 0443   LDLCALC 65 10/08/2020 0443    Physical Exam:    VS:  BP 118/88   Pulse 82   Ht 5\' 6"  (1.676 m)   Wt 223 lb (101.2 kg)   SpO2 98%   BMI 35.99 kg/m     Wt Readings from Last 3 Encounters:  06/16/22 223 lb (101.2 kg)  03/23/22 213 lb (96.6 kg)  02/16/22 212 lb 4 oz (96.3 kg)     GEN:  Well nourished, well developed in no acute distress.  Obese HEENT: Normal NECK: No JVD; No carotid bruits LYMPHATICS: No lymphadenopathy CARDIAC: RRR, no murmurs, rubs, gallops.  ICD pocket well-healed. RESPIRATORY:  Clear to auscultation without rales, wheezing or rhonchi  ABDOMEN: Soft, non-tender, non-distended MUSCULOSKELETAL:  No edema; No deformity  SKIN: Warm and dry NEUROLOGIC:  Alert and oriented x 3 PSYCHIATRIC:  Normal affect        ASSESSMENT:    1. NICM (nonischemic cardiomyopathy) (HCC)   2. Cardiac resynchronization therapy defibrillator (CRT-D) in place   3. Paroxysmal atrial fibrillation (HCC)    PLAN:    In order of problems listed above:   #Chronic systolic heart failure #Nonischemic cardiomyopathy #CRT-D in situ NYHA class II.  Warm and dry on exam.  CRT-D functioning appropriately with an elevated atrial pacing threshold.  Continue current medical therapy.  We will continue remote monitoring of her device.   #Paroxysmal  atrial fibrillation On Eliquis for stroke prophylaxis Less than 1% AT/AF burden on device check today    Follow-up 1 year with an APP.     Medication Adjustments/Labs and Tests Ordered: Current medicines are reviewed at length with the patient today.  Concerns regarding medicines are outlined above.  No orders of the defined types were placed in this encounter.  No orders of the defined types were placed in this encounter.    Signed, 04/18/22, MD, [facility], [facility] 06/16/2022 11:59 AM  Electrophysiology [facility]

## 2022-06-16 ENCOUNTER — Ambulatory Visit: Payer: Medicare Other | Attending: Cardiology | Admitting: Cardiology

## 2022-06-16 ENCOUNTER — Encounter: Payer: Self-pay | Admitting: Cardiology

## 2022-06-16 VITALS — BP 118/88 | HR 82 | Ht 66.0 in | Wt 223.0 lb

## 2022-06-16 DIAGNOSIS — I428 Other cardiomyopathies: Secondary | ICD-10-CM | POA: Diagnosis not present

## 2022-06-16 DIAGNOSIS — I480 Paroxysmal atrial fibrillation: Secondary | ICD-10-CM | POA: Diagnosis not present

## 2022-06-16 DIAGNOSIS — Z95810 Presence of automatic (implantable) cardiac defibrillator: Secondary | ICD-10-CM

## 2022-06-16 NOTE — Patient Instructions (Signed)
Medication Instructions:  none *If you need a refill on your cardiac medications before your next appointment, please call your pharmacy*   Lab Work: none If you have labs (blood work) drawn today and your tests are completely normal, you will receive your results only by: MyChart Message (if you have MyChart) OR A paper copy in the mail If you have any lab test that is abnormal or we need to change your treatment, we will call you to review the results.   Testing/Procedures: none   Follow-Up: At [facility], you and your health needs are our priority.  As part of our continuing mission to provide you with exceptional heart care, we have created designated Provider Care Teams.  These Care Teams include your primary Cardiologist (physician) and Advanced Practice Providers (APPs -  Physician Assistants and Nurse Practitioners) who all work together to provide you with the care you need, when you need it.  We recommend signing up for the patient portal called "MyChart".  Sign up information is provided on this After Visit Summary.  MyChart is used to connect with patients for Virtual Visits (Telemedicine).  Patients are able to view lab/test results, encounter notes, upcoming appointments, etc.  Non-urgent messages can be sent to your provider as well.   To learn more about what you can do with MyChart, go to [email or web address].    Your next appointment:   1 year(s)  The format for your next appointment:   In Person  Provider:   You will see one of the following Advanced Practice Providers on your designated Care Team:   Christopher Berge, NP Ryan Dunn, PA-C Cadence Furth, PA-C Sheri Hammock, NP      Other Instructions None  Important Information About Sugar       

## 2022-06-22 ENCOUNTER — Encounter: Payer: Self-pay | Admitting: Cardiology

## 2022-06-22 ENCOUNTER — Ambulatory Visit: Payer: Medicare Other | Attending: Cardiology | Admitting: Cardiology

## 2022-06-22 VITALS — BP 118/80 | HR 77 | Ht 66.0 in | Wt 224.0 lb

## 2022-06-22 DIAGNOSIS — I480 Paroxysmal atrial fibrillation: Secondary | ICD-10-CM | POA: Diagnosis not present

## 2022-06-22 DIAGNOSIS — R0609 Other forms of dyspnea: Secondary | ICD-10-CM

## 2022-06-22 DIAGNOSIS — I428 Other cardiomyopathies: Secondary | ICD-10-CM | POA: Diagnosis not present

## 2022-06-22 DIAGNOSIS — Z95810 Presence of automatic (implantable) cardiac defibrillator: Secondary | ICD-10-CM | POA: Diagnosis not present

## 2022-06-22 MED ORDER — ATORVASTATIN CALCIUM 40 MG PO TABS: 40.0000 mg | ORAL_TABLET | Freq: Every day | ORAL | 3 refills | Status: AC

## 2022-06-22 MED ORDER — LOSARTAN POTASSIUM 25 MG PO TABS: 25.0000 mg | ORAL_TABLET | Freq: Every day | ORAL | 3 refills | Status: AC

## 2022-06-22 NOTE — Patient Instructions (Signed)
Medication Instructions:  Your physician recommends that you continue on your current medications as directed. Please refer to the Current Medication list given to you today.  *If you need a refill on your cardiac medications before your next appointment, please call your pharmacy*   Follow-Up: At [facility], you and your health needs are our priority.  As part of our continuing mission to provide you with exceptional heart care, we have created designated Provider Care Teams.  These Care Teams include your primary Cardiologist (physician) and Advanced Practice Providers (APPs -  Physician Assistants and Nurse Practitioners) who all work together to provide you with the care you need, when you need it.  We recommend signing up for the patient portal called "MyChart".  Sign up information is provided on this After Visit Summary.  MyChart is used to connect with patients for Virtual Visits (Telemedicine).  Patients are able to view lab/test results, encounter notes, upcoming appointments, etc.  Non-urgent messages can be sent to your provider as well.   To learn more about what you can do with MyChart, go to [email or web address].    Your next appointment:   6 month(s)  The format for your next appointment:   In Person  Provider:   You may see Brian Agbor-Etang, MD or one of the following Advanced Practice Providers on your designated Care Team:   Christopher Berge, NP Ryan Dunn, PA-C Cadence Furth, PA-C Sheri Hammock, NP    Other Instructions   Important Information About Sugar       

## 2022-06-22 NOTE — Progress Notes (Signed)
Cardiology Office Note:    Date:  06/22/2022   ID:  Rebecca Singh, DOB 12-17-1955, MRN [id number]  PCP:  Delena Serve, MD   [facility]  Cardiologist:  Kate Sable, MD  Advanced Practice Provider:  No care team member to display Electrophysiologist:  Vickie Epley, MD       Referring MD: Delena Serve, MD   Chief Complaint  Patient presents with   Follow-up    4 month follow up, tired most days    History of Present Illness:    Rebecca Singh is a 66 y.o. female with a hx of NICM( last EF30-35%) s/p CRT-D (gen change 09/25/2020 [employer]), COPD, CVA (09/2020), paroxysmal atrial fibrillation,  smoker x 68yrs who presents for follow-up.   Seen for NICM, atrial fibrillation.  Repeat echo obtained last month to evaluate EF.  Tolerating current doses of Toprol-XL, Aldactone, losartan.  Takes Eliquis for stroke prophylaxis.  Could not afford SGLT2, Entresto.  Denies edema, bleeding issues with Eliquis.  She previously stopped smoking, gained weight, restarted smoking again.  States not sleeping well, sleeps for about 3 hours nightly.  Also endorses daytime fatigue.  States being told in the past she has COPD.  Has no standing inhalers, has not seen pulmonary medicine.   Prior notes Echocardiogram 10/09/2020 EF 30 to 35%.   Lexiscan Myoview 11/2017 no significant ischemia.   She has residual left-sided weakness from stroke.   Patient presented to the ED at [facility] 10/06/2020 due to severe headache, left-sided weakness.  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  Underwent emergent thrombectomy.  [facility] records: Cath / PCI  01/16/07 [facility]): reportedly minimal CAD. EF 55%  Non-Invasive Evaluations  07/08/08 adenosine SPECT: No evidence of significant ischemia or scar. EF 45-50%  10/04/13 TTE: Mild LVH, EF 30-35%. Normal RV  12/05/14 TTE: Mod LVH, EF 65-70%. Normal RV    Past Medical History:  Diagnosis Date    AICD (automatic cardioverter/defibrillator) present    COPD (chronic obstructive pulmonary disease) (HCC)    Hypothyroidism    Myocardial infarction ([place])    Pacemaker/defibrillator    Stroke [facility])     Past Surgical History:  Procedure Laterality Date   ABDOMINAL HYSTERECTOMY     CHOLECYSTECTOMY     INSERT / REPLACE / REMOVE PACEMAKER     IR CT HEAD LTD  10/07/2020   IR PERCUTANEOUS ART THROMBECTOMY/INFUSION INTRACRANIAL INC DIAG ANGIO  10/07/2020   RADIOLOGY WITH ANESTHESIA N/A 10/07/2020   Procedure: IR WITH ANESTHESIA - CODE STROKE;  Surgeon: Radiologist, Medication, MD;  Location: [place];  Service: Radiology;  Laterality: N/A;   THYROIDECTOMY     TONSILLECTOMY      Current Medications: Current Meds  Medication Sig   amitriptyline (ELAVIL) 50 MG tablet Take 1 tablet (50 mg total) by mouth at bedtime.   apixaban (ELIQUIS) 5 MG TABS tablet Take 1 tablet by mouth twice daily   levothyroxine (SYNTHROID) 88 MCG tablet Take 88 mcg by mouth daily.   metoprolol succinate (TOPROL XL) 25 MG 24 hr tablet Take 1 tablet (25 mg total) by mouth daily.   omeprazole (PRILOSEC) 40 MG capsule Take 40 mg by mouth daily.   spironolactone (ALDACTONE) 25 MG tablet Take 1 tablet (25 mg total) by mouth daily.   topiramate (TOPAMAX) 25 MG tablet Take 1 tablet by mouth twice daily   [DISCONTINUED] atorvastatin (LIPITOR) 40 MG tablet Take 1 tablet by mouth daily.   [  DISCONTINUED] losartan (COZAAR) 25 MG tablet Take 1 tablet by mouth daily.     Allergies:   Codeine, Aspirin, Latex, and Other   Social History   Socioeconomic History   Marital status: Widowed    Spouse name: Not on file   Number of children: Not on file   Years of education: Not on file   Highest education level: Not on file  Occupational History   Not on file  Tobacco Use   Smoking status: Every Day    Packs/day: 1.00    Years: 15.00    Total pack years: 15.00    Types: Cigarettes   Smokeless tobacco: Never  Vaping Use    Vaping Use: Never used  Substance and Sexual Activity   Alcohol use: No   Drug use: No   Sexual activity: Not Currently  Other Topics Concern   Not on file  Social History Narrative   09/17/21 lives with dad ad sister   Social Determinants of Health   Financial Resource Strain: Not on file  Food Insecurity: Not on file  Transportation Needs: Not on file  Physical Activity: Not on file  Stress: Not on file  Social Connections: Not on file     Family History: The patient's family history includes Diabetes in her brother, brother, and mother; Heart disease (age of onset: 30) in her mother; Macular degeneration in her father.  ROS:   Please see the history of present illness.     All other systems reviewed and are negative.  EKGs/Labs/Other Studies Reviewed:    The following studies were reviewed today:   EKG:  EKG is  ordered today.  The ekg ordered today demonstrates a sensed V paced rhythm  Recent Labs: No results found for requested labs within last 365 days.  Recent Lipid Panel    Component Value Date/Time   CHOL 124 10/08/2020 0443   TRIG 52 10/08/2020 0443   HDL 49 10/08/2020 0443   CHOLHDL 2.5 10/08/2020 0443   VLDL 10 10/08/2020 0443   LDLCALC 65 10/08/2020 0443     Risk Assessment/Calculations:      Physical Exam:    VS:  BP 118/80 (BP Location: Left Arm, Patient Position: Sitting, Cuff Size: Large)   Pulse 77   Ht 5\' 6"  (1.676 m)   Wt 224 lb (101.6 kg)   SpO2 99%   BMI 36.15 kg/m     Wt Readings from Last 3 Encounters:  06/22/22 224 lb (101.6 kg)  06/16/22 223 lb (101.2 kg)  03/23/22 213 lb (96.6 kg)     GEN:  Well nourished, well developed in no acute distress HEENT: Normal NECK: No JVD; No carotid bruits CARDIAC: RRR, no murmurs, rubs, gallops RESPIRATORY: Decreased breath sounds at bases ABDOMEN: Soft, non-tender, non-distended MUSCULOSKELETAL:  No edema; No deformity  SKIN: Warm and dry NEUROLOGIC:  Alert and oriented x  3 PSYCHIATRIC:  Normal affect   ASSESSMENT:    1. NICM (nonischemic cardiomyopathy) (HCC)   2. S/P implantation of automatic cardioverter/defibrillator (AICD)   3. Paroxysmal atrial fibrillation (HCC)   4. Dyspnea on exertion    PLAN:    In order of problems listed above:  Nonischemic cardiomyopathy, initial EF 30 to 35%.  Last EF 35 to 40% 05/2022.  Describes NYHA class II symptoms.  Appears euvolemic.  Toprol-XL 25mg  qd,  losartan 25mg  qd, Aldactone to 25 mg daily.  Cost issues preventing use of Entresto, SGLT inhibitors.   Status post 06/2022.  Keep  appointment with device clinic. Paroxysmal atrial fibrillation noted on device interrogation.  CHA2DS2-VASc of 5 (chf, age, gender, stroke) continue on Eliquis 5 mg twice daily.  Continue Toprol-XL Dyspnea on exertion combination of poor sleeping habits, possible OSA, COPD, obesity.  Refer to pulmonary medicine for sleep study and pulmonary function testing/COPD eval.  Low calorie diet, weight loss emphasized.  Follow-up in 6 months   Medication Adjustments/Labs and Tests Ordered: Current medicines are reviewed at length with the patient today.  Concerns regarding medicines are outlined above.  Orders Placed This Encounter  Procedures   Ambulatory referral to Pulmonology   EKG 12-Lead    Meds ordered this encounter  Medications   atorvastatin (LIPITOR) 40 MG tablet    Sig: Take 1 tablet (40 mg total) by mouth daily.    Dispense:  90 tablet    Refill:  3   losartan (COZAAR) 25 MG tablet    Sig: Take 1 tablet (25 mg total) by mouth daily.    Dispense:  90 tablet    Refill:  3     Patient Instructions  Medication Instructions:   Your physician recommends that you continue on your current medications as directed. Please refer to the Current Medication list given to you today.   *If you need a refill on your cardiac medications before your next appointment, please call your pharmacy*   Follow-Up: At [facility], you and your health needs are our priority.  As part of our continuing mission to provide you with exceptional heart care, we have created designated Provider Care Teams.  These Care Teams include your primary Cardiologist (physician) and Advanced Practice Providers (APPs -  Physician Assistants and Nurse Practitioners) who all work together to provide you with the care you need, when you need it.  We recommend signing up for the patient portal called "MyChart".  Sign up information is provided on this After Visit Summary.  MyChart is used to connect with patients for Virtual Visits (Telemedicine).  Patients are able to view lab/test results, encounter notes, upcoming appointments, etc.  Non-urgent messages can be sent to your provider as well.   To learn more about what you can do with MyChart, go to [email or web address].    Your next appointment:   6 month(s)  The format for your next appointment:   In Person  Provider:   You may see Debbe Odea, MD or one of the following Advanced Practice Providers on your designated Care Team:   Nicolasa Ducking, NP Eula Listen, PA-C Cadence Fransico Michael, PA-C Charlsie Quest, NP    Other Instructions   Important Information About Sugar         Signed, Debbe Odea, MD  06/22/2022 10:14 AM    [facility]

## 2022-06-30 ENCOUNTER — Institutional Professional Consult (permissible substitution): Payer: Medicare HMO | Admitting: Student in an Organized Health Care Education/Training Program

## 2022-07-14 ENCOUNTER — Ambulatory Visit (INDEPENDENT_AMBULATORY_CARE_PROVIDER_SITE_OTHER): Payer: Medicare Other

## 2022-07-14 DIAGNOSIS — I428 Other cardiomyopathies: Secondary | ICD-10-CM | POA: Diagnosis not present

## 2022-07-15 LAB — CUP PACEART REMOTE DEVICE CHECK
Battery Remaining Longevity: 78 mo
Battery Remaining Percentage: 85 %
Brady Statistic RA Percent Paced: 12 %
Brady Statistic RV Percent Paced: 96 %
Date Time Interrogation Session: 20231102025100
HighPow Impedance: 50 Ohm
Implantable Lead Connection Status: 753985
Implantable Lead Connection Status: 753985
Implantable Lead Connection Status: 753985
Implantable Lead Implant Date: 20150301
Implantable Lead Implant Date: 20150301
Implantable Lead Implant Date: 20150301
Implantable Lead Location: 753858
Implantable Lead Location: 753859
Implantable Lead Location: 753860
Implantable Lead Model: 4470
Implantable Lead Model: 4677
Implantable Lead Model: 696
Implantable Lead Serial Number: 145508
Implantable Lead Serial Number: 501050
Implantable Lead Serial Number: 769161
Implantable Pulse Generator Implant Date: 20220113
Lead Channel Impedance Value: 316 Ohm
Lead Channel Impedance Value: 445 Ohm
Lead Channel Impedance Value: 502 Ohm
Lead Channel Setting Pacing Amplitude: 2 V
Lead Channel Setting Pacing Amplitude: 2.8 V
Lead Channel Setting Pacing Amplitude: 3.5 V
Lead Channel Setting Pacing Pulse Width: 0.5 ms
Lead Channel Setting Pacing Pulse Width: 1 ms
Lead Channel Setting Sensing Sensitivity: 0.6 mV
Lead Channel Setting Sensing Sensitivity: 1 mV
Pulse Gen Serial Number: 267431
Zone Setting Status: 755011

## 2022-07-27 ENCOUNTER — Other Ambulatory Visit: Payer: Self-pay | Admitting: Adult Health

## 2022-07-27 NOTE — Progress Notes (Signed)
Remote ICD transmission.   

## 2022-08-04 ENCOUNTER — Other Ambulatory Visit: Payer: Self-pay

## 2022-08-04 MED ORDER — SPIRONOLACTONE 25 MG PO TABS: 25.0000 mg | ORAL_TABLET | Freq: Every day | ORAL | 3 refills | Status: DC

## 2022-08-17 ENCOUNTER — Ambulatory Visit (INDEPENDENT_AMBULATORY_CARE_PROVIDER_SITE_OTHER): Payer: Medicare Other | Admitting: Pulmonary Disease

## 2022-08-17 ENCOUNTER — Encounter: Payer: Self-pay | Admitting: Pulmonary Disease

## 2022-08-17 VITALS — BP 142/84 | HR 65 | Temp 97.8°F | Ht 66.0 in | Wt 222.6 lb

## 2022-08-17 DIAGNOSIS — I428 Other cardiomyopathies: Secondary | ICD-10-CM

## 2022-08-17 DIAGNOSIS — F17210 Nicotine dependence, cigarettes, uncomplicated: Secondary | ICD-10-CM | POA: Diagnosis not present

## 2022-08-17 DIAGNOSIS — J449 Chronic obstructive pulmonary disease, unspecified: Secondary | ICD-10-CM | POA: Diagnosis not present

## 2022-08-17 DIAGNOSIS — R0602 Shortness of breath: Secondary | ICD-10-CM

## 2022-08-17 DIAGNOSIS — Z23 Encounter for immunization: Secondary | ICD-10-CM

## 2022-08-17 NOTE — Patient Instructions (Signed)
We are getting breathing test that will tell me about your lungs.  We are referring you to the lung cancer screening program this is a program that allows you to have a CT scan of the chest once a year to monitor for potential lung cancer particularly given your smoking history.  You received your flu vaccine today.  Will see you in follow-up in 3 months time call sooner should any problems arise.

## 2022-08-17 NOTE — Progress Notes (Signed)
Subjective:    Patient ID: Rebecca Singh, female    DOB: 1955-09-30, 66 y.o.   MRN: [id number] Patient Care Team: Ennis Forts, MD as PCP - General (Family Medicine) Debbe Odea, MD as PCP - Cardiology (Cardiology) Lanier Prude, MD as PCP - Electrophysiology (Cardiology)  Chief Complaint  Patient presents with   Consult    SOB with exertion for years. No wheezing or cough.    HPI Patient is a 66 year old smoker who history as noted below who presents for evaluation of shortness of breath present for "years".  She is kindly referred by Dr. Debbe Odea.  The patient states that she has noted dyspnea on exertion for a number of years particularly when going up stairs or inclines.  She does not note any associated wheezing or cough with the symptoms.  She states that she was diagnosed with COPD in the past but does not recall having a formal pulmonary function test.  She noted the symptoms worsen approximately 6 months ago after she had had 6 significant weight gain.  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  She is not on maintenance inhalers.  She does use albuterol every 2 months or so when she notices shortness of breath while not exerting herself.  Does not feel that that helps significantly.  He did have significant gastroesophageal reflux symptoms but these are controlled by PPI.  He does not endorse any chest pain, she has occasional lower extremity edema.  No orthopnea.  She does sleep in a recliner has done this for years.  Did not have issues with asthma as a child.  No frequent bronchial infections as a child.  Not endorse any other symptomatology.  Has had no fevers, chills or sweats.  No sputum production, no hemoptysis.  She has smoked up to 2 packs of cigarettes per day but after her stroke cut back down to a pack a day.  She is somewhat precontemplative about quitting.  She states that she has tried many modalities to quit smoking  but these have failed.  She lives with her son and her grandkids.  She works as a Haematologist at [employer]  She desires to get flu vaccine today.  Review of Systems A 10 point review of systems was performed and it is as noted above otherwise negative.  Past Medical History:  Diagnosis Date   AICD (automatic cardioverter/defibrillator) present    COPD (chronic obstructive pulmonary disease) (HCC)    Hypothyroidism    Myocardial infarction (HCC)    Pacemaker/defibrillator    Stroke [facility])    Past Surgical History:  Procedure Laterality Date   ABDOMINAL HYSTERECTOMY     CHOLECYSTECTOMY     INSERT / REPLACE / REMOVE PACEMAKER     IR CT HEAD LTD  10/07/2020   IR PERCUTANEOUS ART THROMBECTOMY/INFUSION INTRACRANIAL INC DIAG ANGIO  10/07/2020   RADIOLOGY WITH ANESTHESIA N/A 10/07/2020   Procedure: IR WITH ANESTHESIA - CODE STROKE;  Surgeon: Radiologist, Medication, MD;  Location: [facility] OR;  Service: Radiology;  Laterality: N/A;   THYROIDECTOMY     TONSILLECTOMY     Patient Active Problem List   Diagnosis Date Noted   Rhinitis 11/12/2020   Loose stools    Visual disturbance    AKI (acute kidney injury) (HCC)    Hemiparesis affecting left side as late effect of stroke (HCC)    Leukocytosis    Urge incontinence    Chronic  systolic congestive heart failure (HCC)    Vascular headache    Anxiety state    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   Acute myopericarditis    SAH (subarachnoid hemorrhage) (HCC)    Tobacco abuse    Chronic pain syndrome    Prediabetes    Nonischemic cardiomyopathy (HCC)    AICD (automatic cardioverter/defibrillator) present    Acute ischemic right MCA stroke (HCC) 10/07/2020   Middle cerebral artery embolism, right 10/07/2020   History of total knee arthroplasty 06/17/2020   Osteoarthritis of knee 05/06/2020   Chest pain 11/16/2017   Respiratory distress 06/03/2015   Chronic pain of left knee 07/16/2014   Insomnia 11/29/2013    Morbid obesity due to excess calories (HCC) 11/21/2013   Incontinence of urine 07/11/2013   COPD with acute exacerbation (HCC) 07/10/2013   Lumbosacral spondylosis 01/16/2013   Depressive disorder 08/01/2012   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   Thoracic or lumbosacral neuritis or radiculitis 04/12/2011   Restless legs syndrome 03/29/2011   DDD (degenerative disc disease), lumbosacral 03/22/2011   Carpal tunnel syndrome 03/02/2011   Hypothyroidism 12/30/2010   Vitamin D deficiency 11/17/2009   Ulcer of esophagus without bleeding 12/09/2008   Atrial flutter (HCC) 11/14/2007   Esophageal reflux 05/31/2007   Left bundle branch block 05/30/2007   Hypertension, benign 06/06/2006   Family History  Problem Relation Age of Onset   Diabetes Mother    Heart disease Mother 34       stent placed    Macular degeneration Father    Diabetes Brother    Diabetes Brother    Social History   Tobacco Use   Smoking status: Every Day    Packs/day: 2.00    Years: 15.00    Total pack years: 30.00    Types: Cigarettes   Smokeless tobacco: Never   Tobacco comments:    1 PPD- 08/17/2022  Substance Use Topics   Alcohol use: No   Allergies  Allergen Reactions   Codeine Shortness Of Breath   Aspirin Other (See Comments)    "burns" the patient's stomach   Latex Other (See Comments)    Makes the "skin peel"    Other Nausea Only and Other (See Comments)    Spicy foods- "Upset the patient's stomach"   Current Meds  Medication Sig   albuterol (PROVENTIL) (2.5 MG/3ML) 0.083% nebulizer solution Take 2.5 mg by nebulization as needed for wheezing or shortness of breath.   amitriptyline (ELAVIL) 50 MG tablet TAKE 1 TABLET BY MOUTH AT BEDTIME   apixaban (ELIQUIS) 5 MG TABS tablet Take 1 tablet by mouth twice daily   atorvastatin (LIPITOR) 40 MG tablet Take 1 tablet (40 mg total) by mouth daily.   levothyroxine (SYNTHROID) 88 MCG tablet Take 88 mcg by mouth daily.   losartan (COZAAR) 25 MG tablet Take 1  tablet (25 mg total) by mouth daily.   metoprolol succinate (TOPROL XL) 25 MG 24 hr tablet Take 1 tablet (25 mg total) by mouth daily.   omeprazole (PRILOSEC) 40 MG capsule Take 40 mg by mouth daily.   spironolactone (ALDACTONE) 25 MG tablet Take 1 tablet (25 mg total) by mouth daily.   topiramate (TOPAMAX) 25 MG tablet Take 1 tablet by mouth twice daily   Immunization History  Administered Date(s) Administered   H1N1 08/12/2008   Hepatitis A, Adult 02/27/2018   Influenza, Seasonal, Injecte, Preservative Fre 09/11/2007, 05/27/2008, 10/06/2009, 10/12/2010, 07/13/2011, 06/21/2012, 08/13/2013   Influenza,inj,Quad PF,6+ Mos 07/16/2014, 06/02/2015, 11/17/2017, 07/23/2019  Influenza-Unspecified 11/17/2016   Moderna Sars-Covid-2 Vaccination 02/06/2020, 04/22/2020   PNEUMOCOCCAL CONJUGATE-20 04/10/2021   Pneumococcal Conjugate,unspecified 11/17/2017   Pneumococcal Polysaccharide-23 09/11/2007, 10/12/2010, 11/17/2017   Td (Adult),unspecified 09/11/2007   Tdap 03/01/2011   Zoster Recombinat (Shingrix) 04/10/2021       Objective:   Physical Exam BP (!) 142/84 (BP Location: Left Arm, Cuff Size: Large)   Pulse 65   Temp 97.8 F (36.6 C)   Ht 5\' 6"  (1.676 m)   Wt 222 lb 9.6 oz (101 kg)   SpO2 98%   BMI 35.93 kg/m   SpO2: 98 % O2 Device: None (Room air)  GENERAL: Obese woman, no acute distress, fully ambulatory, no conversational dyspnea. HEAD: Normocephalic, atraumatic.  EYES: Pupils equal, round, reactive to light.  No scleral icterus.  MOUTH: Patient intact. NECK: Supple. No thyromegaly. Trachea midline. No JVD.  No adenopathy. PULMONARY: Good air entry bilaterally.  Coarse, otherwise, no adventitious sounds. CARDIOVASCULAR: Pacer in situ, on left.  S1 and S2. Regular rate and rhythm.  No rubs, murmurs or gallops heard. ABDOMEN: Obese otherwise benign. MUSCULOSKELETAL: No joint deformity, no clubbing, no edema.  NEUROLOGIC: Very mild left-sided weakness compared to right.  No  other deficits noted. SKIN: Intact,warm,dry. PSYCH: Mood and behavior normal.     Assessment & Plan:     ICD-10-CM   1. COPD suggested by initial evaluation [facility])  J44.9 Pulmonary Function Test [facility] Only   Will obtain PFTs    2. Shortness of breath  R06.02 Pulmonary Function Test [facility] Only   Suspect multifactorial Nonischemic cardiomyopathy/COPD Obesity/deconditioning PFTs    3. Nonischemic cardiomyopathy (HCC)  I42.8    This issue is complexity to her management Follows with cardiology Recommend ongoing follow-up with cardiology    4. Tobacco dependence due to cigarettes  F17.210 Ambulatory Referral for Lung Cancer Scre   Patient counseled regards to discontinuation of smoking Total counseling time 3 to 5 minutes Will enroll in lung cancer screening    5. Need for immunization against influenza  Z23 Flu Vaccine QUAD High Dose(Fluad)   Patient received influenza vaccine today.     Orders Placed This Encounter  Procedures   Flu Vaccine QUAD High Dose(Fluad)   Ambulatory Referral for Lung Cancer Scre    Referral Priority:   Routine    Referral Type:   Consultation    Referral Reason:   Specialty Services Required    Number of Visits Requested:   1   Pulmonary Function Test [facility] Only    Standing Status:   Future    Standing Expiration Date:   08/18/2023    Order Specific Question:   Full PFT: includes the following: basic spirometry, spirometry pre & post bronchodilator, diffusion capacity (DLCO), lung volumes    Answer:   Full PFT    Order Specific Question:   This test can only be performed at    Answer:   [facility]   Will see the patient in follow-up in 3 months time she is to call sooner should any new problems arise.  Gailen Shelter, MD Advanced Bronchoscopy PCCM [facility]    *This note was dictated using voice recognition software/Dragon.  Despite best efforts to proofread, errors can occur which can change the meaning. Any  transcriptional errors that result from this process are unintentional and may not be fully corrected at the time of dictation.

## 2022-09-23 ENCOUNTER — Ambulatory Visit: Payer: Medicare Other | Admitting: Adult Health

## 2022-10-13 ENCOUNTER — Ambulatory Visit: Payer: Medicare Other

## 2022-10-13 DIAGNOSIS — I428 Other cardiomyopathies: Secondary | ICD-10-CM

## 2022-10-13 LAB — CUP PACEART REMOTE DEVICE CHECK
Battery Remaining Longevity: 72 mo
Battery Remaining Percentage: 83 %
Brady Statistic RA Percent Paced: 13 %
Brady Statistic RV Percent Paced: 96 %
Date Time Interrogation Session: 20240131025100
HighPow Impedance: 58 Ohm
Implantable Lead Connection Status: 753985
Implantable Lead Connection Status: 753985
Implantable Lead Connection Status: 753985
Implantable Lead Implant Date: 20150301
Implantable Lead Implant Date: 20150301
Implantable Lead Implant Date: 20150301
Implantable Lead Location: 753858
Implantable Lead Location: 753859
Implantable Lead Location: 753860
Implantable Lead Model: 4470
Implantable Lead Model: 4677
Implantable Lead Model: 696
Implantable Lead Serial Number: 145508
Implantable Lead Serial Number: 501050
Implantable Lead Serial Number: 769161
Implantable Pulse Generator Implant Date: 20220113
Lead Channel Impedance Value: 335 Ohm
Lead Channel Impedance Value: 492 Ohm
Lead Channel Impedance Value: 536 Ohm
Lead Channel Setting Pacing Amplitude: 2 V
Lead Channel Setting Pacing Amplitude: 2.8 V
Lead Channel Setting Pacing Amplitude: 3.5 V
Lead Channel Setting Pacing Pulse Width: 0.5 ms
Lead Channel Setting Pacing Pulse Width: 1 ms
Lead Channel Setting Sensing Sensitivity: 0.6 mV
Lead Channel Setting Sensing Sensitivity: 1 mV
Pulse Gen Serial Number: 267431
Zone Setting Status: 755011

## 2022-10-24 ENCOUNTER — Other Ambulatory Visit: Payer: Self-pay | Admitting: Adult Health

## 2022-10-29 ENCOUNTER — Encounter: Payer: Self-pay | Admitting: *Deleted

## 2022-11-04 NOTE — Progress Notes (Signed)
Remote ICD transmission.   

## 2022-11-15 NOTE — Progress Notes (Unsigned)
[facility] [address] [place]. [place] [address] [phone]       STROKE FOLLOW UP NOTE  Rebecca Singh Date of Birth:  [birth year] Medical Record Number:  [id number]   Reason for Referral:  stroke follow up    SUBJECTIVE:   CHIEF COMPLAINT:  No chief complaint on file.    HPI:   Update 11/16/2022 JM: Patient returns for stroke follow-up visit.  Overall stable without new stroke/TIA symptoms.  Reports residual LUE weakness, stable since prior visit.  Headaches remain well-controlled on topiramate and amitriptyline, tolerating without side effects.  Compliant on Eliquis and atorvastatin Blood pressure well-controlled Routinely follows with PCP and cardiology     History provided for reference purposes only Update 03/23/2022 JM: Patient returns for 61-monthstroke follow-up unaccompanied .  Overall stable without new stroke/TIA symptoms.  Reports residual left arm weakness but improvement since prior visit, occasional LLE "slowness" but denies weakness.  Continues to maintain ADLs and IADLs apparently as well as working without difficulty.   Headaches have improved since prior visit and now typically only present with increased stress, has continued on topamax '25mg'$  twice daily and amitriptyline 50 mg daily, denies side effects.   Compliant on Eliquis and atorvastatin, denies side effects.  Blood pressure today 125/80.  Routinely followed by PCP and cardiology.  No further concerns at this time  Update 09/17/2021 JM:   Returns for 663-monthtroke follow-up.  Overall stable.  Denies new stroke/TIA symptoms.  Residual LUE weakness, difficulty with fine motor, weakness and limping with LLE.  Continues to maintain ADLs and majority of IADLs.  Returned back to work back in August without difficulty.    She is unsure what medications she currently takes as her sister maPublishing rights managerith meds. She has a box containing current meds (aspirin?, eliquis,  amitriptyline, aldactone, prilosec, synthroid) and waiting to see PCP next week for refill of atorvastatin, losartan and metoprolol. Blood pressure today 138/90 - does not routinely monitor at home. Remains on amitriptyline for headache prevention - prescribed topamax from PMR but at some point ran out and not currently on - reports she was unable to obtain a refill.  Has headaches 1x weekly that start in her neck and shoot into her left occiput. Improved with Tylenol although taking almost daily 2-3 tablets 3-4x daily. Unknown if she used the Nurtec previously provided as sample. PCP rx'd tizanidine but she does not remember using this - does endorse neck pain as well increased stressors - admits to continued tobacco use. No further concerns at this time.    Update 03/09/2021 JM: Rebecca Singh for 3-39-monthroke follow-up accompanied by her niece, Rebecca BaileyShe has been doing well since prior visit with improvement of residual deficits with some LUE weakness -continues to work with OT.  Impairment of her gait and LLE weakness currently ambulating without assistive device and has returned back to driving and majority of IADLs.  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  Also remains on topamax '50mg'$  BID.  Denies new stroke/TIA symptoms.  Since prior visit, she has been diagnosed with atrial fibrillation which was found on pacer interrogation and Eliquis 5 mg twice daily initiated which she has been tolerating without side effects.  She has also remained on atorvastatin 40 mg daily without associated side effects.  Blood pressure today 112/78.  Unfortunately,  returned back to tobacco use approx 1 pack every 3-4 days due to increased stressors.  She has a trip planned to [place] next month to stay with her brother for a while and is hopeful that stress levels  can be at a minimum and plans on trying to quit smoking again.  No further concerns at this time.  Initial visit 11/25/2020 JM: Rebecca Singh is being seen for hospital follow-up accompanied by her niece, Rebecca Singh  Reports residual left sided weakness with sensory impairment, visual impairment, cognitive slowing, sensory impairment, and gait impairment -currently working with [facility]/OT reporting continued improvement Use of RW for short distance and w/c for long distance -denies any recent falls Her greatest concern today is in regards to continued headaches which were present during admission -compliant on topiramate 50 mg twice daily.  Denies benefit with Fioricet.  FREQUENT use of tylenol - approx 8-9 '500mg'$  tabs/day.  Reports headaches consistently throughout the day associated with photophobia and phonophobia Also struggling with increased anxiety and agitation -underlying history of anxiety/depression not currently on medication management - feels anxiety related to current medical condition and residual stroke deficits Denies new stroke/TIA symptoms  Compliant on aspirin 81 mg daily and atorvastatin 40 mg daily -denies side effects Blood pressure today 137/92 Reports complete tobacco cessation since hospital discharge  No further concerns at this time   Stroke admission 10/07/2020 Rebecca Singh is a 67 y.o. 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  She presented to [facility] ED on 10/07/2020 with severe headache, left-sided weakness and left facial droop.  Personally reviewed hospitalization pertinent progress notes, lab work and imaging with  summary provided.  Initially evaluated by Dr. 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  Unable to obtain MRI due to recent pacemaker switch.  ICD interrogation negative for A. fib.  2D echo showed EF 30 to 35%.  LDL 65.  A1c 6.1.  Current tobacco use of smoking cessation counseling provided.  Other stroke risk factors include nonischemic cardiomyopathy, myopericarditis, reported medication noncompliance, advanced age, obesity and prior stroke history.   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 Code Stroke CT head:  Hypodensity right frontal parietal lobe suspicious for acute infarct. In addition, hyperdense sign at right sylvian fissure MCA CTA head & neck : Right M2/MCA posterior division branch occlusion with moderate distal branch opacification via leptomeningeal collaterals.  MRI: Contraindicated due to recent pacemaker switch, per MRI technician need a minimum of 6 weeks from procedure. (09/25/2020) IR - R M2 inferior division TICI 2c revasc CT head post op 1/25 - unchanged CT repeat 10/09/2020: Subacute right frontoparietal and right temporal infarct.  SAH within the right sylvian fissure is unchanged CT repeat 10/13/2020: Right MCA distribution infarct stable in size.  No evidence for hemorrhagic transformation.  Decrease hypodensity within the right sylvian fissure. 2D Echo -  EF 30 to 35%.  ICD interrogation no afib - new ICD from 09/25/20 LDL 65 HgbA1c: 6.1 VTE prophylaxis -SCDs aspirin 81 mg daily prior to admission, now  on aspirin 81 mg daily, started on 10/13/2020 after repeat CT scan Topamax 25 mg twice daily for headache Therapy recommendations: CIR  Disposition: Today to CIR    ROS:   14 system review of systems performed and negative with exception of those  listed in HPI  PMH:  Past Medical History:  Diagnosis Date   AICD (automatic cardioverter/defibrillator) present    COPD (chronic obstructive pulmonary disease) (HCC)    Hypothyroidism    Myocardial infarction ([place])    Pacemaker/defibrillator    Stroke ([place])     PSH:  Past Surgical History:  Procedure Laterality Date   ABDOMINAL HYSTERECTOMY     CHOLECYSTECTOMY     INSERT / REPLACE / REMOVE PACEMAKER     IR CT HEAD LTD  10/07/2020   IR PERCUTANEOUS ART THROMBECTOMY/INFUSION INTRACRANIAL INC DIAG ANGIO  10/07/2020   RADIOLOGY WITH ANESTHESIA N/A 10/07/2020   Procedure: IR WITH ANESTHESIA - CODE STROKE;  Surgeon: Radiologist, Medication, MD;  Location: [facility];  Service: Radiology;  Laterality: N/A;   THYROIDECTOMY     TONSILLECTOMY      Social History:  Social History   Socioeconomic History   Marital status: Widowed    Spouse name: Not on file   Number of children: Not on file   Years of education: Not on file   Highest education level: Not on file  Occupational History   Not on file  Tobacco Use   Smoking status: Every Day    Packs/day: 2.00    Years: 15.00    Total pack years: 30.00    Types: Cigarettes   Smokeless tobacco: Never   Tobacco comments:    1 PPD- 08/17/2022  Vaping Use   Vaping Use: Never used  Substance and Sexual Activity   Alcohol use: No   Drug use: No   Sexual activity: Not Currently  Other Topics Concern   Not on file  Social History Narrative   09/17/21 lives with dad ad sister   Social Determinants of Health   Financial Resource Strain: Not on file  Food Insecurity: Not on file  Transportation Needs: Not on file  Physical Activity: Not on file  Stress: Not on file  Social Connections: Not on file  Intimate Partner Violence: Not on file    Family History:  Family History  Problem Relation Age of Onset   Diabetes Mother    Heart disease Mother 53       stent placed    Macular degeneration Father    Diabetes Brother    Diabetes  Brother     Medications:   Current Outpatient Medications on File Prior to Visit  Medication Sig Dispense Refill   albuterol (PROVENTIL) (2.5 MG/3ML) 0.083% nebulizer solution Take 2.5 mg by nebulization as needed for wheezing or shortness of breath.     amitriptyline (ELAVIL) 50 MG tablet TAKE 1 TABLET BY MOUTH AT BEDTIME 30 tablet 0   apixaban (ELIQUIS) 5 MG TABS tablet Take 1 tablet by mouth twice daily 180 tablet 1   atorvastatin (LIPITOR) 40 MG tablet Take 1 tablet (40 mg total) by mouth daily. 90 tablet 3   levothyroxine (SYNTHROID) 88 MCG tablet Take 88 mcg by mouth daily.     losartan (COZAAR) 25 MG tablet Take 1 tablet (25 mg total) by mouth daily. 90 tablet 3   metoprolol succinate (TOPROL XL) 25 MG 24 hr tablet Take 1 tablet (25 mg total) by mouth daily. 30 tablet 1   omeprazole (PRILOSEC)  40 MG capsule Take 40 mg by mouth daily.     spironolactone (ALDACTONE) 25 MG tablet Take 1 tablet (25 mg total) by mouth daily. 30 tablet 3   topiramate (TOPAMAX) 25 MG tablet Take 1 tablet by mouth twice daily 60 tablet 0   No current facility-administered medications on file prior to visit.    Allergies:   Allergies  Allergen Reactions   Codeine Shortness Of Breath   Aspirin Other (See Comments)    "burns" the patient's stomach   Latex Other (See Comments)    Makes the "skin peel"    Other Nausea Only and Other (See Comments)    Spicy foods- "Upset the patient's stomach"      OBJECTIVE:  Physical Exam  There were no vitals filed for this visit.  There is no height or weight on file to calculate BMI. No results found.   General: well developed, well nourished,  pleasant middle-aged Caucasian female, seated, in no evident distress Head: head normocephalic and atraumatic.   Neck: supple with no carotid or supraclavicular bruits Cardiovascular: regular rate and rhythm, no murmurs Musculoskeletal: no deformity Skin:  no rash/petichiae Vascular:  Normal pulses all  extremities   Neurologic Exam Mental Status: Awake and fully alert.  Fluent speech and language.  Oriented to place and time. Recent memory subjectively impaired and remote memory intact. Attention span, concentration and fund of knowledge appropriate appropriate during visit. Mood and affect appropriate.  Cranial Nerves: Pupils equal, briskly reactive to light. Extraocular movements full without nystagmus. Visual fields full to confrontation. Hearing intact. Facial sensation intact. Face, tongue, palate moves normally and symmetrically.  Motor: Normal strength, bulk and tone on right upper and lower extremity.  LUE: 4+/5 with decreased grip strength and hand dexterity LLE: 5/5 Sensory.:  intact to light touch, vibratory and pinprick sensation Coordination: Rapid alternating movements normal in all extremities except slightly decreased decreased left hand. Finger-to-nose and heel-to-shin performed accurately bilaterally. Gait and Station: Stands from seated position without difficulty.  Gait demonstrates normal stride length and balance without use of assistive device.   Reflexes: 1+ and symmetric. Toes downgoing.         ASSESSMENT: ABRAHAM DIRK is a 67 y.o. year old female with history of right MCA infarct with right M2/MCA posterior division branch occlusion s/p IR with TICI 2C reperfusion, concerning for cardioembolic source given extensive cardiac history on 10/07/2020. Vascular risk factors include polysubstance abuse, chronic pain, nonischemic cardiomyopathy (recent EF 30 to 35%), s/p chamber replacement for pacemaker 09/25/2020, new dx of A.fib, history of postpartum stroke, tobacco use, HTN, pre-DM and obesity.     PLAN:  R MCA stroke:  Residual deficit: mild left sided deficits. Stable.  Vascular headache: Well-controlled,  continue amitriptyline 50 mg daily and topiramate 25 mg twice daily.  Can continue decreasing dosage at follow-up visit if remains stable.   Continue  Eliquis (apixaban) daily and atorvastatin '40mg'$  daily for secondary stroke prevention.   Discussed secondary stroke prevention measures and importance of close PCP follow up for aggressive stroke risk factor management including HTN with BP goal<130/90, HLD with LDL goal<70 and pre-DM with A1c goal<7 - encouraged reaching out to PCP for med refills but plans to f/u with PCP next week for med refills and lab work. Encouraged she start assisting with medications as she was does not know what she is currently taking as her sister manages - discussed importance of knowing medications she is taking and to take all medications as  prescribed  Atrial fibrillation: dx'd after ICD interrogation.  On Eliquis 5 mg twice daily routinely followed by cardiology Tobacco use: Discussed importance of complete tobacco cessation. Advised f/u with PCP for assistance    Follow up in 6 months or call earlier if needed   CC:  PCP: Delena Serve, MD    I spent 31 minutes of face-to-face and non-face-to-face time with patient.  This included previsit chart review, lab review, study review, electronic health record documentation, patient education and discussion regarding above diagnoses and treatment plan and answered all other questions to patient's satisfaction  Frann Rider, [facility]  [facility] [address] [place], [place] [address]  Phone [phone] Fax [phone] Note: This document was prepared with digital dictation and possible smart phrase technology. Any transcriptional errors that result from this process are unintentional.

## 2022-11-16 ENCOUNTER — Encounter: Payer: Self-pay | Admitting: Adult Health

## 2022-11-16 ENCOUNTER — Ambulatory Visit (INDEPENDENT_AMBULATORY_CARE_PROVIDER_SITE_OTHER): Payer: 59 | Admitting: Adult Health

## 2022-11-16 VITALS — BP 126/88 | HR 95 | Ht 66.0 in | Wt 222.0 lb

## 2022-11-16 DIAGNOSIS — I63411 Cerebral infarction due to embolism of right middle cerebral artery: Secondary | ICD-10-CM | POA: Diagnosis not present

## 2022-11-16 DIAGNOSIS — G441 Vascular headache, not elsewhere classified: Secondary | ICD-10-CM | POA: Diagnosis not present

## 2022-11-16 MED ORDER — NURTEC 75 MG PO TBDP: 75.0000 mg | ORAL_TABLET | ORAL | 5 refills | Status: DC | PRN

## 2022-11-16 MED ORDER — TOPIRAMATE 25 MG PO TABS: 25.0000 mg | ORAL_TABLET | Freq: Two times a day (BID) | ORAL | 3 refills | Status: DC

## 2022-11-16 MED ORDER — AMITRIPTYLINE HCL 50 MG PO TABS: 50.0000 mg | ORAL_TABLET | Freq: Every day | ORAL | 3 refills | Status: AC

## 2022-11-16 NOTE — Patient Instructions (Addendum)
Continue Topamax and amitriptyline for headache prevention  Recommend trying Nurtec as needed for migraine rescue, please let me know if this is not helpful and we can try a different medication at that point  Continue Eliquis (apixaban) daily  and atorvastatin for secondary stroke prevention  Continue to follow up with PCP regarding blood pressure and cholesterol management  Maintain strict control of hypertension with blood pressure goal below 130/90 and cholesterol with LDL cholesterol (bad cholesterol) goal below 70 mg/dL.   Signs of a Stroke? Follow the BEFAST method:  Balance Watch for a sudden loss of balance, trouble with coordination or vertigo Eyes Is there a sudden loss of vision in one or both eyes? Or double vision?  Face: Ask the person to smile. Does one side of the face droop or is it numb?  Arms: Ask the person to raise both arms. Does one arm drift downward? Is there weakness or numbness of a leg? Speech: Ask the person to repeat a simple phrase. Does the speech sound slurred/strange? Is the person confused ? Time: If you observe any of these signs, call 911.     Followup in the future with me in 1 year or call earlier if needed      Thank you for coming to see [place] at [facility]. I hope we have been able to provide you high quality care today.  You may receive a patient satisfaction survey over the next few weeks. We would appreciate your feedback and comments so that we may continue to improve ourselves and the health of our patients.

## 2022-12-13 ENCOUNTER — Telehealth: Payer: Self-pay

## 2022-12-13 NOTE — Telephone Encounter (Signed)
PA sent to [facility]/Nurtec Key: [facility] Waiting for response.

## 2022-12-13 NOTE — Telephone Encounter (Signed)
Please disregarded last message, wrong pt. Patient needs a new Rx PA for her Nurtec.

## 2022-12-15 ENCOUNTER — Other Ambulatory Visit: Payer: Self-pay | Admitting: *Deleted

## 2022-12-15 MED ORDER — SPIRONOLACTONE 25 MG PO TABS: 25.0000 mg | ORAL_TABLET | Freq: Every day | ORAL | 0 refills | Status: DC

## 2022-12-15 NOTE — Telephone Encounter (Signed)
PA completed on [facility] [id number] Approved Request Reference Number: [id number]. NURTEC TAB 75MG  ODT is approved through 09/13/2023.

## 2022-12-15 NOTE — Telephone Encounter (Signed)
Scheduled 04/23

## 2023-01-01 NOTE — Progress Notes (Unsigned)
Cardiology Office Note    Date:  01/04/2023   ID:  [name], DOB [birth year], MRN [id number]  PCP:  Ennis Forts, MD  Cardiologist:  Debbe Odea, MD  Electrophysiologist:  Lanier Prude, MD   Chief Complaint: Follow-up  History of Present Illness:   Rebecca Singh is a 67 y.o. 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  Remote LHC in 2008 minimal CAD with EF 55%.  Adenosine stress test in 2009 showed no evidence of ischemia or scar with an EF of 45 to 50%.  She was previously followed by [facility] cardiology with device initially implanted in 11/2013 and revision a few days later.  Echo in 2015 showed an EF of 30 to 35%.  In 2016, echo showed an EF of 65 to 70% with normal RV systolic function and moderate LVH.  Echo in 2019 demonstrated an EF of 55 to 60% with grade 1 diastolic dysfunction.  Lexiscan MPI in 2019 showed no evidence of ischemia.  She was admitted to the hospital in 09/2020 with severe headache and left-sided weakness.  She was found to have an acute right middle cerebral artery CVA and underwent emergent thrombectomy.  She has residual left-sided weakness from stroke.  Echo in 09/2020 showed an EF of 30 to 35%, grade 1 diastolic dysfunction, mildly reduced RV systolic function with normal ventricular cavity size and insufficiency.  She has since transitioned her care to Drs. Agbor-Etang and Lalla Brothers.  Most recent echo from 05/2022 showed an EF of 35 to 40%, global hypokinesis, grade 1 diastolic dysfunction, normal RV systolic function and ventricular cavity size, and mild aortic insufficiency.  Financial constraints have limited GDMT including SGLT2 inhibitor and ARNI.  She was last seen in the  office in 06/2022 and was noted to have restarted smoking.  She was not sleeping well with noted daytime fatigue.  She was referred to pulmonology for PFTs which remain pending and consideration of sleep study.  She comes in doing well from a cardiac perspective and is without symptoms of angina or cardiac decompensation.  Denies any significant dyspnea, palpitations, dizziness, presyncope, or syncope.  No falls, medic easy, or melena.  No progressive orthopnea.  She does not add salt to food and tries not to eat out at restaurants very often.  No progressive lower extremity swelling.  Her weight is down 6 pounds today when compared to her last clinic visit in 06/2022.  She is interested in GLP-1 agonist and is without personal or family history of medullary thyroid carcinoma or neuroendocrine neoplasma syndrome.  She is not interested in moving forward with sleep study and does not feel like she has significant dyspnea or sleep disordered breathing.  She denies daytime somnolence.  Continues to smoke.   Labs independently reviewed: 10/2021 - TSH normal, A1c 5.6, potassium 4.5, BUN 20, serum creatinine 0.79, albumin 3.9, AST/ALT normal 10/2020 - Hgb 15.0, PLT 275 09/2020 - TC 124, TG 52, HDL 49, LDL 65  Past Medical History:  Diagnosis Date   AICD (automatic cardioverter/defibrillator) present    COPD (chronic obstructive pulmonary disease)    Hypothyroidism    Myocardial infarction    Pacemaker/defibrillator    Stroke     Past Surgical History:  Procedure Laterality Date   ABDOMINAL HYSTERECTOMY     CHOLECYSTECTOMY     INSERT / REPLACE / REMOVE PACEMAKER     IR CT HEAD LTD  10/07/2020   IR PERCUTANEOUS ART THROMBECTOMY/INFUSION INTRACRANIAL INC DIAG ANGIO  10/07/2020   RADIOLOGY WITH ANESTHESIA N/A 10/07/2020   Procedure: IR WITH ANESTHESIA - CODE STROKE;  Surgeon: Radiologist, Medication, MD;  Location: [facility] OR;  Service: Radiology;  Laterality: N/A;   THYROIDECTOMY     TONSILLECTOMY       Current Medications: Current Meds  Medication Sig   albuterol (PROVENTIL) (2.5 MG/3ML) 0.083% nebulizer solution Take 2.5 mg by nebulization as needed for wheezing or shortness of breath.   amitriptyline (ELAVIL) 50 MG tablet Take 1 tablet (50 mg total) by mouth at bedtime.   apixaban (ELIQUIS) 5 MG TABS tablet Take 1 tablet by mouth twice daily   atorvastatin (LIPITOR) 40 MG tablet Take 1 tablet (40 mg total) by mouth daily.   levothyroxine (SYNTHROID) 88 MCG tablet Take 88 mcg by mouth daily.   losartan (COZAAR) 25 MG tablet Take 1 tablet (25 mg total) by mouth daily.   metoprolol succinate (TOPROL XL) 25 MG 24 hr tablet Take 1 tablet (25 mg total) by mouth daily.   omeprazole (PRILOSEC) 40 MG capsule Take 40 mg by mouth daily.   Rimegepant Sulfate (NURTEC) 75 MG TBDP Take 1 tablet (75 mg total) by mouth as needed (migraine).   Semaglutide-Weight Management (WEGOVY) 0.25 MG/0.5ML SOAJ Inject 0.25 mg into the skin once a week.   spironolactone (ALDACTONE) 25 MG tablet Take 1 tablet (25 mg total) by mouth daily.   topiramate (TOPAMAX) 25 MG tablet Take 1 tablet (25 mg total) by mouth 2 (two) times daily.    Allergies:   Codeine, Aspirin, Latex, and Other   Social History   Socioeconomic History   Marital status: Widowed    Spouse name: Not on file   Number of children: Not on file   Years of education: Not on file   Highest education level: Not on file  Occupational History   Not on file  Tobacco Use   Smoking status: Every Day    Packs/day: 2.00    Years: 15.00    Additional pack years: 0.00    Total pack years: 30.00    Types: Cigarettes   Smokeless tobacco: Never   Tobacco comments:    1 PPD- 08/17/2022  Vaping Use   Vaping Use: Never used  Substance and Sexual Activity   Alcohol use: No   Drug use: No   Sexual activity: Not Currently  Other Topics Concern   Not on file  Social History Narrative   09/17/21 lives with dad ad sister   Social Determinants of  Health   Financial Resource Strain: Not on file  Food Insecurity: Not on file  Transportation Needs: Not on file  Physical Activity: Not on file  Stress: Not on file  Social Connections: Not on file     Family History:  The patient's family history includes Diabetes in her brother, brother, and mother; Heart disease (age of onset: 37) in her mother; Macular degeneration in her father.  ROS:   12-point review of systems is negative unless otherwise noted in the HPI.   EKGs/Labs/Other Studies Reviewed:    Studies reviewed were summarized above. The additional studies were reviewed today:  Limited echo 05/19/2022: 1. Left ventricular ejection fraction, by estimation, is 35 to 40%. The  left ventricle has moderately  decreased function. The left ventricle  demonstrates global hypokinesis. Left ventricular diastolic parameters are  consistent with Grade I diastolic  dysfunction (impaired relaxation).   2. Right ventricular systolic function is normal. The right ventricular  size is normal.   3. Aortic valve regurgitation is mild.   Comparison(s): Previous LVEF reported as 30-35%.  __________  2D echo 10/09/2020: 1. Left ventricular ejection fraction, by estimation, is 30 to 35%. The  left ventricle has moderately decreased function. The left ventricle  demonstrates global hypokinesis. The left ventricular internal cavity size  was mildly dilated. There is mild  left ventricular hypertrophy. Left ventricular diastolic parameters are  consistent with Grade I diastolic dysfunction (impaired relaxation).   2. Right ventricular systolic function is mildly reduced. The right  ventricular size is normal. Tricuspid regurgitation signal is inadequate  for assessing PA pressure.   3. Left atrial size was mildly dilated.   4. The mitral valve is normal in structure. No evidence of mitral valve  regurgitation. No evidence of mitral stenosis.   5. The aortic valve is tricuspid. Aortic valve  regurgitation is mild. No  aortic stenosis is present.   6. The inferior vena cava is normal in size with greater than 50%  respiratory variability, suggesting right atrial pressure of 3 mmHg.  __________  Eugenie Birks MPI 11/17/2017: Pharmacological myocardial perfusion imaging study with no significant  Ischemia Small region of mild fixed defect in the distal inferior, apical region, likely secondary to significant GI uptake artifact Normal wall motion, EF estimated at 48% (depressed secondary to GI uptake artifact) No EKG changes concerning for ischemia at peak stress or in recovery. EKG showing paced rhythm Low risk scan, Echocardiogram documenting normal ejection fraction, EF greater than 55%  __________  2D echo 11/16/2017: - Left ventricle: The cavity size was normal. Wall thickness was    normal. Systolic function was normal. The estimated ejection    fraction was in the range of 55% to 60%. Wall motion was normal;    there were no regional wall motion abnormalities. Doppler    parameters are consistent with abnormal left ventricular    relaxation (grade 1 diastolic dysfunction).  - Aortic valve: There was trivial regurgitation.   Impressions:   - Normal LVF    Normal Wall Motion    EF=55-60%    Normal Right side    Pacer wire in RV. Normal study. No cardiac source of emboli was    indentified.    EKG:  EKG is ordered today.  The EKG ordered today demonstrates A sensed, V paced, 82 bpm  Recent Labs: No results found for requested labs within last 365 days.  Recent Lipid Panel    Component Value Date/Time   CHOL 124 10/08/2020 0443   TRIG 52 10/08/2020 0443   HDL 49 10/08/2020 0443   CHOLHDL 2.5 10/08/2020 0443   VLDL 10 10/08/2020 0443   LDLCALC 65 10/08/2020 0443    PHYSICAL EXAM:    VS:  BP (!) 120/92 (BP Location: Left Arm, Patient Position: Sitting, Cuff Size: Large)   Pulse 82   Ht 5' 6.5" (1.689 m)   Wt 218 lb (98.9 kg)   SpO2 98%   BMI 34.66 kg/m   BMI:  Body mass index is 34.66 kg/m.  Physical Exam Vitals reviewed.  Constitutional:      Appearance: She is well-developed.  HENT:     Head: Normocephalic and atraumatic.  Eyes:     General:  Right eye: No discharge.        Left eye: No discharge.  Neck:     Vascular: No JVD.  Cardiovascular:     Rate and Rhythm: Normal rate and regular rhythm.     Heart sounds: Normal heart sounds, S1 normal and S2 normal. Heart sounds not distant. No midsystolic click and no opening snap. No murmur heard.    No friction rub.  Pulmonary:     Effort: Pulmonary effort is normal. No respiratory distress.     Breath sounds: Normal breath sounds. No decreased breath sounds, wheezing or rales.  Chest:     Chest wall: No tenderness.  Abdominal:     General: There is no distension.  Musculoskeletal:     Cervical back: Normal range of motion.     Right lower leg: No edema.     Left lower leg: No edema.  Skin:    General: Skin is warm and dry.     Nails: There is no clubbing.  Neurological:     Mental Status: She is alert and oriented to person, place, and time.  Psychiatric:        Speech: Speech normal.        Behavior: Behavior normal.        Thought Content: Thought content normal.        Judgment: Judgment normal.     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  Financial constraints have precluded usage of ARNI or SGLT2 inhibitor.  She remains on losartan, Toprol-XL, and spironolactone.  Not requiring standing loop diuretic.  No device alarms or shocks.  CHF education.  Follow-up with EP for ongoing management of CRT as directed.  PAF: Paced rhythm.  Remains on Toprol-XL.  CHA2DS2-VASc at least 6 (CHF, age x 1,  stroke x 2, vascular disease, sex category).  Remains on apixaban 5 mg twice daily and does not need to reduce dosing criteria.  No symptoms concerning for bleeding.  History of CVA: No new deficits.  She remains on apixaban and atorvastatin.  Aortic atherosclerosis: LDL 65 in 09/2020.  She remains on atorvastatin.  Obesity: Trial of Wegovy in an effort to reduce MACE risk.  No patient or family history of medullary thyroid carcinoma or multiple endocrine neoplasm syndrome.  Weight loss is encouraged through out of the diet and regular exercise.   Disposition: F/u with Dr. Azucena Cecil or an APP in 6 months, and EP as directed.    Medication Adjustments/Labs and Tests Ordered: Current medicines are reviewed at length with the patient today.  Concerns regarding medicines are outlined above. Medication changes, Labs and Tests ordered today are summarized above and listed in the Patient Instructions accessible in Encounters.   Signed, Eula Listen, PA-C 01/04/2023 3:39 PM     [facility] - [place] [address] [place], [place] [address] [phone]

## 2023-01-04 ENCOUNTER — Ambulatory Visit: Payer: 59 | Attending: Physician Assistant | Admitting: Physician Assistant

## 2023-01-04 ENCOUNTER — Encounter: Payer: Self-pay | Admitting: Physician Assistant

## 2023-01-04 VITALS — BP 120/92 | HR 82 | Ht 66.5 in | Wt 218.0 lb

## 2023-01-04 DIAGNOSIS — I480 Paroxysmal atrial fibrillation: Secondary | ICD-10-CM | POA: Diagnosis not present

## 2023-01-04 DIAGNOSIS — I428 Other cardiomyopathies: Secondary | ICD-10-CM | POA: Diagnosis not present

## 2023-01-04 DIAGNOSIS — I5020 Unspecified systolic (congestive) heart failure: Secondary | ICD-10-CM

## 2023-01-04 DIAGNOSIS — I700 Atherosclerosis of aorta: Secondary | ICD-10-CM

## 2023-01-04 DIAGNOSIS — Z8673 Personal history of transient ischemic attack (TIA), and cerebral infarction without residual deficits: Secondary | ICD-10-CM

## 2023-01-04 DIAGNOSIS — Z95810 Presence of automatic (implantable) cardiac defibrillator: Secondary | ICD-10-CM | POA: Diagnosis not present

## 2023-01-04 DIAGNOSIS — Z6834 Body mass index (BMI) 34.0-34.9, adult: Secondary | ICD-10-CM

## 2023-01-04 DIAGNOSIS — E669 Obesity, unspecified: Secondary | ICD-10-CM

## 2023-01-04 MED ORDER — WEGOVY 0.25 MG/0.5ML ~~LOC~~ SOAJ: 0.2500 mg | SUBCUTANEOUS | 0 refills | Status: DC

## 2023-01-04 NOTE — Patient Instructions (Signed)
Medication Instructions:  Your physician has recommended you make the following change in your medication:  We will send the first 4 weeks and then you give [place] a call so that we can send in the next 4 weeks.   Start taking Wegovy  Month 1: 0.25 mg once a week.  Month 2: 0.5 mg once a week. (Call or send [place] a MyChart message when you are on week 2, so we can send your next dose in for you).  Month 3: 1 mg once a week.  Month 4: 1.7 mg once a week.  Month 5 and beyond: 2.4 mg once a week (maintenance dose)   *If you need a refill on your cardiac medications before your next appointment, please call your pharmacy*   Lab Work: None  If you have labs (blood work) drawn today and your tests are completely normal, you will receive your results only by: MyChart Message (if you have MyChart) OR A paper copy in the mail If you have any lab test that is abnormal or we need to change your treatment, we will call you to review the results.   Testing/Procedures: None   Follow-Up: At [facility], you and your health needs are our priority.  As part of our continuing mission to provide you with exceptional heart care, we have created designated Provider Care Teams.  These Care Teams include your primary Cardiologist (physician) and Advanced Practice Providers (APPs -  Physician Assistants and Nurse Practitioners) who all work together to provide you with the care you need, when you need it.   Your next appointment:   6 month(s)  Provider:   Debbe Odea, MD or Eula Listen, PA-C

## 2023-01-12 ENCOUNTER — Ambulatory Visit (INDEPENDENT_AMBULATORY_CARE_PROVIDER_SITE_OTHER): Payer: 59

## 2023-01-12 DIAGNOSIS — I428 Other cardiomyopathies: Secondary | ICD-10-CM

## 2023-01-12 LAB — CUP PACEART REMOTE DEVICE CHECK
Battery Remaining Longevity: 66 mo
Battery Remaining Percentage: 78 %
Brady Statistic RA Percent Paced: 11 %
Brady Statistic RV Percent Paced: 97 %
Date Time Interrogation Session: 20240501025100
HighPow Impedance: 51 Ohm
Implantable Lead Connection Status: 753985
Implantable Lead Connection Status: 753985
Implantable Lead Connection Status: 753985
Implantable Lead Implant Date: 20150301
Implantable Lead Implant Date: 20150301
Implantable Lead Implant Date: 20150301
Implantable Lead Location: 753858
Implantable Lead Location: 753859
Implantable Lead Location: 753860
Implantable Lead Model: 4470
Implantable Lead Model: 4677
Implantable Lead Model: 696
Implantable Lead Serial Number: 145508
Implantable Lead Serial Number: 501050
Implantable Lead Serial Number: 769161
Implantable Pulse Generator Implant Date: 20220113
Lead Channel Impedance Value: 306 Ohm
Lead Channel Impedance Value: 431 Ohm
Lead Channel Impedance Value: 464 Ohm
Lead Channel Setting Pacing Amplitude: 2 V
Lead Channel Setting Pacing Amplitude: 2.8 V
Lead Channel Setting Pacing Amplitude: 3.5 V
Lead Channel Setting Pacing Pulse Width: 0.5 ms
Lead Channel Setting Pacing Pulse Width: 1 ms
Lead Channel Setting Sensing Sensitivity: 0.6 mV
Lead Channel Setting Sensing Sensitivity: 1 mV
Pulse Gen Serial Number: 267431
Zone Setting Status: 755011

## 2023-01-13 ENCOUNTER — Other Ambulatory Visit: Payer: Self-pay | Admitting: Cardiology

## 2023-01-13 NOTE — Telephone Encounter (Signed)
Prescription refill request for Eliquis received. Indication: PAF Last office visit: 01/04/23  R Dun PA-C Scr: 0.79  Epic Age: 67 Weight: 98.9kg  Based on above findings Eliquis 5mg  twice daily is the appropriate dose.  Refill approved.

## 2023-01-14 ENCOUNTER — Other Ambulatory Visit: Payer: Self-pay

## 2023-01-14 MED ORDER — SPIRONOLACTONE 25 MG PO TABS: 25.0000 mg | ORAL_TABLET | Freq: Every day | ORAL | 5 refills | Status: AC

## 2023-01-17 ENCOUNTER — Telehealth: Payer: Self-pay | Admitting: Physician Assistant

## 2023-01-17 NOTE — Telephone Encounter (Signed)
Pharmacy had been out of stock on the medication but they do have some now and will have it ready for patient in about an hour.

## 2023-01-17 NOTE — Telephone Encounter (Signed)
Pharmacy calling back stating that it needs prior authorization. Advised that I would send this over to our team for assistance with this.   Called patient back and reviewed information in regards to her medication. She verbalized understanding with no further questions at this time.

## 2023-01-17 NOTE — Telephone Encounter (Signed)
Spoke with patient and reviewed that she may pick it up in one hour. No further needs.

## 2023-01-17 NOTE — Telephone Encounter (Signed)
Spoke with patient and reviewed that it was sent to [facility] on [address]. She reports that they have not called her to pick it up. She has not ask them about it. Advised that I would call to check the status. She was appreciative for the call with no further questions at this time.

## 2023-01-17 NOTE — Telephone Encounter (Signed)
  Pt c/o medication issue:  1. Name of Medication:   Semaglutide-Weight Management (WEGOVY) 0.25 MG/0.5ML SOAJ    2. How are you currently taking this medication (dosage and times per day)? Inject 0.25 mg into the skin once a week.   3. Are you having a reaction (difficulty breathing--STAT)? No   4. What is your medication issue? Pt said, she called [facility] and was told they did not receive any prescription for this medication

## 2023-01-26 NOTE — Telephone Encounter (Signed)
Patient calling to follow up about the wegovy.

## 2023-01-28 ENCOUNTER — Telehealth: Payer: Self-pay

## 2023-01-28 ENCOUNTER — Other Ambulatory Visit (HOSPITAL_COMMUNITY): Payer: Self-pay

## 2023-01-28 NOTE — Telephone Encounter (Signed)
Pharmacy Patient Advocate Encounter  Prior Authorization for [facility] has been approved.   Effective dates: 01/28/23 through 09/13/23   Haze Rushing, CPhT Pharmacy Patient Advocate Specialist Direct Number: [phone] Fax: [phone]

## 2023-01-28 NOTE — Telephone Encounter (Signed)
Pharmacy Patient Advocate Encounter   Received notification from [facility] MEDICARE that prior authorization for [facility] is needed.    PA submitted on 01/28/23 Key [id number] Status is pending  Haze Rushing, CPhT Pharmacy Patient Advocate Specialist Direct Number: [phone] Fax: [phone]

## 2023-01-28 NOTE — Telephone Encounter (Signed)
See other telephone encounter.

## 2023-01-28 NOTE — Telephone Encounter (Signed)
See other telephone encounter. Patient made aware of the approval and that she may now get her prescription. She verbalized understanding

## 2023-01-28 NOTE — Telephone Encounter (Signed)
She can fill anywhere once approved that's who she has drug insurance coverage with.

## 2023-01-28 NOTE — Telephone Encounter (Signed)
PA initiated, please see separate encounter for updates on determination. (I will route you back in once a decision has been made)  Dewan Emond, CPhT Pharmacy Patient Advocate Specialist Direct Number: [phone] Fax: [phone]  

## 2023-01-28 NOTE — Telephone Encounter (Signed)
Called and spoke with patient. Reviewed that prior Rebecca Singh is still pending and that we will call once that gets done. She verbalized understanding with no further questions at this time.

## 2023-01-28 NOTE — Telephone Encounter (Signed)
See other telephone encounter. Patient made aware of the approval and that she may now get her prescription. She verbalized understanding 

## 2023-02-02 NOTE — Progress Notes (Signed)
Remote ICD transmission.   

## 2023-02-19 ENCOUNTER — Other Ambulatory Visit: Payer: Self-pay | Admitting: Physician Assistant

## 2023-02-21 NOTE — Telephone Encounter (Signed)
Sent mychart message

## 2023-02-23 NOTE — Telephone Encounter (Signed)
Spoke with pt concerning refill for [facility]. Pt confirmed that she will be starting her 2 month dose on Saturday. Please advise for new Rx and correct dose.

## 2023-02-24 MED ORDER — WEGOVY 0.5 MG/0.5ML ~~LOC~~ SOAJ: 0.5000 mg | SUBCUTANEOUS | 0 refills | Status: DC

## 2023-02-24 NOTE — Telephone Encounter (Signed)
Requested Prescriptions   Signed Prescriptions Disp Refills   WEGOVY 0.25 MG/0.5ML SOAJ 4 mL 0    Sig: INJECT 0.25 MG UNDER THE SKIN ONCE WEEKLY    Authorizing Provider: Sondra Barges    Ordering User: Kendrick Fries   Semaglutide-Weight Management (WEGOVY) 0.5 MG/0.5ML SOAJ 2 mL 0    Sig: Inject 0.5 mg into the skin once a week.    Authorizing Provider: Sondra Barges    Ordering User: Kendrick Fries

## 2023-02-24 NOTE — Telephone Encounter (Signed)
Contacted Pharmacy to make sure that Wegovy 0.25 mg dose is D/C. New Rx sent for Wegovy 0.5 mg once week

## 2023-02-24 NOTE — Telephone Encounter (Signed)
Please advise for correct Rx.

## 2023-02-24 NOTE — Addendum Note (Signed)
Addended by: Kendrick Fries on: 02/24/2023 10:13 AM   Modules accepted: Orders

## 2023-03-11 ENCOUNTER — Encounter: Payer: Self-pay | Admitting: Pulmonary Disease

## 2023-03-19 ENCOUNTER — Other Ambulatory Visit: Payer: Self-pay | Admitting: Physician Assistant

## 2023-04-13 ENCOUNTER — Ambulatory Visit (INDEPENDENT_AMBULATORY_CARE_PROVIDER_SITE_OTHER): Payer: 59

## 2023-04-13 DIAGNOSIS — I428 Other cardiomyopathies: Secondary | ICD-10-CM

## 2023-04-14 LAB — CUP PACEART REMOTE DEVICE CHECK
Battery Remaining Longevity: 66 mo
Battery Remaining Percentage: 77 %
Brady Statistic RA Percent Paced: 12 %
Brady Statistic RV Percent Paced: 97 %
Date Time Interrogation Session: 20240801030500
HighPow Impedance: 54 Ohm
Implantable Lead Connection Status: 753985
Implantable Lead Connection Status: 753985
Implantable Lead Connection Status: 753985
Implantable Lead Implant Date: 20150301
Implantable Lead Implant Date: 20150301
Implantable Lead Implant Date: 20150301
Implantable Lead Location: 753858
Implantable Lead Location: 753859
Implantable Lead Location: 753860
Implantable Lead Model: 4470
Implantable Lead Model: 4677
Implantable Lead Model: 696
Implantable Lead Serial Number: 145508
Implantable Lead Serial Number: 501050
Implantable Lead Serial Number: 769161
Implantable Pulse Generator Implant Date: 20220113
Lead Channel Impedance Value: 326 Ohm
Lead Channel Impedance Value: 459 Ohm
Lead Channel Impedance Value: 467 Ohm
Lead Channel Setting Pacing Amplitude: 2 V
Lead Channel Setting Pacing Amplitude: 2.8 V
Lead Channel Setting Pacing Amplitude: 3.5 V
Lead Channel Setting Pacing Pulse Width: 0.5 ms
Lead Channel Setting Pacing Pulse Width: 1 ms
Lead Channel Setting Sensing Sensitivity: 0.6 mV
Lead Channel Setting Sensing Sensitivity: 1 mV
Pulse Gen Serial Number: 267431
Zone Setting Status: 755011

## 2023-04-17 ENCOUNTER — Other Ambulatory Visit: Payer: Self-pay | Admitting: Physician Assistant

## 2023-04-28 NOTE — Progress Notes (Signed)
Remote ICD transmission.   

## 2023-05-04 ENCOUNTER — Telehealth: Payer: Self-pay | Admitting: Pulmonary Disease

## 2023-05-04 NOTE — Telephone Encounter (Signed)
Noted  

## 2023-05-04 NOTE — Telephone Encounter (Signed)
Working recall pt. Called back to say she doesn't want to f/u anymore

## 2023-05-26 ENCOUNTER — Telehealth: Payer: Self-pay | Admitting: Cardiology

## 2023-05-26 NOTE — Telephone Encounter (Signed)
*  STAT* If patient is at the pharmacy, call can be transferred to refill team.   1. Which medications need to be refilled? (please list name of each medication and dose if known) WEGOVY 0.5 MG/0.5ML SOAJ   2. Which pharmacy/location (including street and city if local pharmacy) is medication to be sent to? [id number] -  ([place]), [place] - [address]   3. Do they need a 30 day or 90 day supply? 90

## 2023-05-26 NOTE — Telephone Encounter (Signed)
Unable to LVM/No answer to verify medication dose.

## 2023-05-27 ENCOUNTER — Other Ambulatory Visit: Payer: Self-pay

## 2023-05-27 MED ORDER — SEMAGLUTIDE-WEIGHT MANAGEMENT 2.4 MG/0.75ML ~~LOC~~ SOAJ: 2.4000 mg | SUBCUTANEOUS | 2 refills | Status: DC

## 2023-05-27 NOTE — Telephone Encounter (Signed)
Patient returned staff call.

## 2023-05-27 NOTE — Telephone Encounter (Signed)
Medication sent to pharmacy on file.    Disp Refills Start End   Semaglutide-Weight Management 2.4 MG/0.75ML SOAJ 3 mL 2 09/20/2023 12/13/2023   Sig - Route: Inject 2.4 mg into the skin once a week. - Subcutaneous   Sent to pharmacy as: Semaglutide-Weight Management 2.4 MG/0.75ML Solution Auto-injector   E-Prescribing Status: Receipt confirmed by pharmacy (05/27/2023 10:51 AM EDT)    Pharmacy  [id number] - [place] ([place]), [place] - [address]

## 2023-05-27 NOTE — Telephone Encounter (Signed)
LVM to verify medication dose.

## 2023-07-13 ENCOUNTER — Ambulatory Visit (INDEPENDENT_AMBULATORY_CARE_PROVIDER_SITE_OTHER): Payer: 59

## 2023-07-13 DIAGNOSIS — I428 Other cardiomyopathies: Secondary | ICD-10-CM

## 2023-07-13 LAB — CUP PACEART REMOTE DEVICE CHECK
Battery Remaining Longevity: 66 mo
Battery Remaining Percentage: 74 %
Brady Statistic RA Percent Paced: 13 %
Brady Statistic RV Percent Paced: 97 %
Date Time Interrogation Session: 20241030034300
HighPow Impedance: 55 Ohm
Implantable Lead Connection Status: 753985
Implantable Lead Connection Status: 753985
Implantable Lead Connection Status: 753985
Implantable Lead Implant Date: 20150301
Implantable Lead Implant Date: 20150301
Implantable Lead Implant Date: 20150301
Implantable Lead Location: 753858
Implantable Lead Location: 753859
Implantable Lead Location: 753860
Implantable Lead Model: 4470
Implantable Lead Model: 4677
Implantable Lead Model: 696
Implantable Lead Serial Number: 145508
Implantable Lead Serial Number: 501050
Implantable Lead Serial Number: 769161
Implantable Pulse Generator Implant Date: 20220113
Lead Channel Impedance Value: 332 Ohm
Lead Channel Impedance Value: 475 Ohm
Lead Channel Impedance Value: 600 Ohm
Lead Channel Setting Pacing Amplitude: 2 V
Lead Channel Setting Pacing Amplitude: 2.8 V
Lead Channel Setting Pacing Amplitude: 3.5 V
Lead Channel Setting Pacing Pulse Width: 0.5 ms
Lead Channel Setting Pacing Pulse Width: 1 ms
Lead Channel Setting Sensing Sensitivity: 0.6 mV
Lead Channel Setting Sensing Sensitivity: 1 mV
Pulse Gen Serial Number: 267431
Zone Setting Status: 755011

## 2023-07-21 NOTE — Progress Notes (Signed)
Cardiology Office Note    Date:  07/22/2023   ID:  Rebecca Singh, DOB 10-07-1955, MRN [id number]  PCP:  Ennis Forts, MD  Cardiologist:  Debbe Odea, MD  Electrophysiologist:  Lanier Prude, MD   Chief Complaint: Follow-up  History of Present Illness:   Rebecca Singh is a 67 y.o. 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   Remote LHC in 2008 minimal CAD with EF 55%.  Adenosine stress test in 2009 showed no evidence of ischemia or scar with an EF of 45 to 50%.   She was previously followed by [facility] cardiology with device initially implanted in 11/2013 and revision a few days later.  Echo in 2015 showed an EF of 30 to 35%.  In 2016, echo showed an EF of 65 to 70% with normal RV systolic function and moderate LVH.  Echo in 2019 demonstrated an EF of 55 to 60% with grade 1 diastolic dysfunction.  Lexiscan MPI in 2019 showed no evidence of ischemia.  She was admitted to the hospital in 09/2020 with severe headache and left-sided weakness.  She was found to have an acute right middle cerebral artery CVA and underwent emergent thrombectomy.  She has residual left-sided weakness from stroke.  Echo in 09/2020 showed an EF of 30 to 35%, grade 1 diastolic dysfunction, mildly reduced RV systolic function with normal ventricular cavity size and insufficiency.  She has since transitioned her care to Drs. Agbor-Etang and Lalla Brothers.  Most recent echo from 05/2022 showed an EF of 35 to 40%, global hypokinesis, grade 1 diastolic dysfunction, normal RV systolic function and ventricular cavity size, and mild aortic insufficiency.  Financial constraints have limited GDMT including SGLT2 inhibitor and ARNI.  She was seen in the  office in 06/2022 and was noted to have restarted smoking.  She was not sleeping well with noted daytime fatigue.  She was referred to pulmonology for PFTs which remain pending and consideration of sleep study.  She was last seen in the office in 12/2021 with a weight that was down 6 pounds when compared to her visit in 06/2022.  She was interested in GLP-1 therapy.  She was without symptoms of angina or cardiac decompensation.  She was not interested in moving forward with a sleep study.  She continued to smoke.  She was initiated on Wegovy.  She comes in today and is doing cardiac perspective.  She indicates she feels "great."  No symptoms of angina or cardiac decompensation.  No dyspnea, dizziness, presyncope, or syncope.  No lower extremity single progressive orthopnea.  She does not add salt to food.  Her weight is down 30 pounds today compared to her visit in 12/2022 following the addition of Wegovy.  Tolerating GLP-1 therapy without off target effect.  She indicates her insurance will no longer be covering Wegovy starting next year.  1 mechanical fall this past summer without LOC.  No symptoms concerning for bleeding.  Otherwise, she does not have any acute cardiac concerns at this time.  She is very pleased with how she feels at this time.   Labs independently reviewed: 03/2023 - potassium 4.8, BUN 22, serum creatinine 1.01, magnesium 2.1 10/2021 - TSH normal, A1c 5.6, albumin 3.9, AST/ALT normal  10/2020 - Hgb 15.0, PLT 275 09/2020 - TC 124, TG 52, HDL 49, LDL 65  Past Medical History:  Diagnosis Date   AICD (automatic cardioverter/defibrillator) present    COPD (chronic obstructive pulmonary disease) (HCC)    Hypothyroidism    Myocardial infarction (HCC)    Pacemaker/defibrillator    Stroke [facility])     Past Surgical History:  Procedure Laterality Date   ABDOMINAL HYSTERECTOMY     CHOLECYSTECTOMY     INSERT / REPLACE / REMOVE PACEMAKER     IR CT HEAD LTD  10/07/2020   IR PERCUTANEOUS ART  THROMBECTOMY/INFUSION INTRACRANIAL INC DIAG ANGIO  10/07/2020   RADIOLOGY WITH ANESTHESIA N/A 10/07/2020   Procedure: IR WITH ANESTHESIA - CODE STROKE;  Surgeon: Radiologist, Medication, MD;  Location: [facility] OR;  Service: Radiology;  Laterality: N/A;   THYROIDECTOMY     TONSILLECTOMY      Current Medications: Current Meds  Medication Sig   albuterol (PROVENTIL) (2.5 MG/3ML) 0.083% nebulizer solution Take 2.5 mg by nebulization as needed for wheezing or shortness of breath.   amitriptyline (ELAVIL) 50 MG tablet Take 1 tablet (50 mg total) by mouth at bedtime.   apixaban (ELIQUIS) 5 MG TABS tablet Take 1 tablet by mouth twice daily   atorvastatin (LIPITOR) 40 MG tablet Take 1 tablet (40 mg total) by mouth daily.   diclofenac Sodium (VOLTAREN) 1 % GEL Apply topically.   levothyroxine (SYNTHROID) 88 MCG tablet Take 88 mcg by mouth daily.   losartan (COZAAR) 25 MG tablet Take 1 tablet (25 mg total) by mouth daily.   metoprolol succinate (TOPROL XL) 25 MG 24 hr tablet Take 1 tablet (25 mg total) by mouth daily.   omeprazole (PRILOSEC) 40 MG capsule Take 40 mg by mouth daily.   Rimegepant Sulfate (NURTEC) 75 MG TBDP Take 1 tablet (75 mg total) by mouth as needed (migraine).   spironolactone (ALDACTONE) 25 MG tablet Take 1 tablet (25 mg total) by mouth daily.   topiramate (TOPAMAX) 25 MG tablet Take 1 tablet (25 mg total) by mouth 2 (two) times daily.   [DISCONTINUED] Semaglutide-Weight Management 2.4 MG/0.75ML SOAJ Inject 2.4 mg into the skin once a week.    Allergies:   Codeine, Aspirin, Latex, and Other   Social History   Socioeconomic History   Marital status: Widowed    Spouse name: Not on file   Number of children: Not on file   Years of education: Not on file   Highest education level: Not on file  Occupational History   Not on file  Tobacco Use   Smoking status: Every Day    Current packs/day: 2.00    Average packs/day: 2.0 packs/day for 15.0 years (30.0 ttl pk-yrs)    Types:  Cigarettes   Smokeless tobacco: Never   Tobacco comments:    1 PPD- 08/17/2022  Vaping Use   Vaping status: Never Used  Substance and Sexual Activity   Alcohol use: No   Drug use: No   Sexual activity: Not Currently  Other Topics Concern   Not on file  Social History Narrative   09/17/21 lives with dad ad sister   Social Determinants of Health   Financial Resource Strain: Low Risk  (01/20/2022)   Received from [facility], [facility]   Overall Financial Resource Strain (CARDIA)    Difficulty of Paying Living Expenses: Not hard at all  Food Insecurity: No Food Insecurity (01/20/2022)   Received [facility], [facility]  Hunger Vital Sign    Worried About Running Out of Food in the Last Year: Never true    Ran Out of Food in the Last Year: Never true  Transportation Needs: No Transportation Needs (01/20/2022)   Received from [facility], [facility]   [facility] - Transportation    Lack of Transportation (Medical): No    Lack of Transportation (Non-Medical): No  Physical Activity: Not on file  Stress: Not on file  Social Connections: Not on file     Family History:  The patient's family history includes Diabetes in her brother, brother, and mother; Heart disease (age of onset: 22) in her mother; Macular degeneration in her father.  ROS:   12-point review of systems is negative unless otherwise noted in the HPI.   EKGs/Labs/Other Studies Reviewed:    Studies reviewed were summarized above. The additional studies were reviewed today:  Limited echo 05/19/2022: 1. Left ventricular ejection fraction, by estimation, is 35 to 40%. The  left ventricle has moderately decreased function. The left ventricle  demonstrates global hypokinesis. Left ventricular diastolic parameters are  consistent with Grade I diastolic  dysfunction (impaired relaxation).   2. Right ventricular systolic function is normal. The right ventricular  size is normal.   3. Aortic  valve regurgitation is mild.   Comparison(s): Previous LVEF reported as 30-35%.  __________   2D echo 10/09/2020: 1. Left ventricular ejection fraction, by estimation, is 30 to 35%. The  left ventricle has moderately decreased function. The left ventricle  demonstrates global hypokinesis. The left ventricular internal cavity size  was mildly dilated. There is mild  left ventricular hypertrophy. Left ventricular diastolic parameters are  consistent with Grade I diastolic dysfunction (impaired relaxation).   2. Right ventricular systolic function is mildly reduced. The right  ventricular size is normal. Tricuspid regurgitation signal is inadequate  for assessing PA pressure.   3. Left atrial size was mildly dilated.   4. The mitral valve is normal in structure. No evidence of mitral valve  regurgitation. No evidence of mitral stenosis.   5. The aortic valve is tricuspid. Aortic valve regurgitation is mild. No  aortic stenosis is present.   6. The inferior vena cava is normal in size with greater than 50%  respiratory variability, suggesting right atrial pressure of 3 mmHg.  __________   Eugenie Birks MPI 11/17/2017: Pharmacological myocardial perfusion imaging study with no significant  Ischemia Small region of mild fixed defect in the distal inferior, apical region, likely secondary to significant GI uptake artifact Normal wall motion, EF estimated at 48% (depressed secondary to GI uptake artifact) No EKG changes concerning for ischemia at peak stress or in recovery. EKG showing paced rhythm Low risk scan, Echocardiogram documenting normal ejection fraction, EF greater than 55%  __________   2D echo 11/16/2017: - Left ventricle: The cavity size was normal. Wall thickness was    normal. Systolic function was normal. The estimated ejection    fraction was in the range of 55% to 60%. Wall motion was normal;    there were no regional wall motion abnormalities. Doppler    parameters are  consistent with abnormal left ventricular    relaxation (grade 1 diastolic dysfunction).  - Aortic valve: There was trivial regurgitation.   Impressions:   - Normal LVF    Normal Wall Motion    EF=55-60%    Normal Right side    Pacer wire in RV. Normal study. No cardiac source of emboli was  indentified.    EKG:  EKG is ordered today.  The EKG ordered today demonstrates A-sensed, V-paced rhythm, 76 bpm  Recent Labs: No results found for requested labs within last 365 days.  Recent Lipid Panel    Component Value Date/Time   CHOL 124 10/08/2020 0443   TRIG 52 10/08/2020 0443   HDL 49 10/08/2020 0443   CHOLHDL 2.5 10/08/2020 0443   VLDL 10 10/08/2020 0443   LDLCALC 65 10/08/2020 0443    PHYSICAL EXAM:    VS:  BP 110/83 (BP Location: Left Arm, Patient Position: Sitting, Cuff Size: Normal)   Pulse 76   Ht 5\' 6"  (1.676 m)   Wt 188 lb 6.4 oz (85.5 kg)   SpO2 98%   BMI 30.41 kg/m   BMI: Body mass index is 30.41 kg/m.  Physical Exam Vitals reviewed.  Constitutional:      Appearance: She is well-developed.  HENT:     Head: Normocephalic and atraumatic.  Eyes:     General:        Right eye: No discharge.        Left eye: No discharge.  Neck:     Vascular: No JVD.  Cardiovascular:     Rate and Rhythm: Normal rate and regular rhythm.     Pulses:          Posterior tibial pulses are 2+ on the right side and 2+ on the left side.     Heart sounds: Normal heart sounds, S1 normal and S2 normal. Heart sounds not distant. No midsystolic click and no opening snap. No murmur heard.    No friction rub.  Pulmonary:     Effort: Pulmonary effort is normal. No respiratory distress.     Breath sounds: Normal breath sounds. No decreased breath sounds, wheezing, rhonchi or rales.  Chest:     Chest wall: No tenderness.  Abdominal:     General: There is no distension.  Musculoskeletal:     Cervical back: Normal range of motion.     Right lower leg: No edema.     Left lower leg:  No edema.  Skin:    General: Skin is warm and dry.     Nails: There is no clubbing.  Neurological:     Mental Status: She is alert and oriented to person, place, and time.  Psychiatric:        Speech: Speech normal.        Behavior: Behavior normal.        Thought Content: Thought content normal.        Judgment: Judgment normal.     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  Initial EF 30 to 35% with most recent EF 35 to 40%.  Financial constraints have precluded usage of ARNI or SGLT2 inhibitor. She remains on losartan, Toprol-XL, and spironolactone. Not requiring standing loop diuretic. No device alarms or shocks. CHF education. Follow-up with EP for ongoing management of CRT as directed.   PAF: Paced rhythm.  Remains on Toprol-XL.  CHA2DS2-VASc at least 6 (CHF, age x 1, stroke x 2, vascular disease, sex category).  Remains on apixaban 5 mg twice daily and does not meet reduced dosing criteria.  No symptoms concerning for  bleeding.  Check CMP and CBC.  History of CVA: No new deficits.  She remains on apixaban and atorvastatin.  Aortic atherosclerosis: LDL 65 in 09/2020.  She remains on atorvastatin.  Obtain CMP, lipid panel, direct LDL.  Obesity: Weight is down 30 pounds with Wegovy when compared to her visit in 12/2022.  She indicates her insurance will no longer cover Wegovy starting next year.  She will contact her insurance and update [place] with coverage options.    Disposition: F/u with Dr. Azucena Cecil or an APP in 6 months, and EP as directed.    Medication Adjustments/Labs and Tests Ordered: Current medicines are reviewed at length with the patient today.  Concerns regarding medicines are outlined above. Medication changes, Labs and Tests ordered today are  summarized above and listed in the Patient Instructions accessible in Encounters.   Signed, Eula Listen, PA-C 07/22/2023 3:03 PM     [facility] [place] [address] [place], [place] [address] [phone]

## 2023-07-22 ENCOUNTER — Ambulatory Visit: Payer: 59 | Attending: Physician Assistant | Admitting: Physician Assistant

## 2023-07-22 ENCOUNTER — Encounter: Payer: Self-pay | Admitting: Emergency Medicine

## 2023-07-22 ENCOUNTER — Encounter: Payer: Self-pay | Admitting: Physician Assistant

## 2023-07-22 VITALS — BP 110/83 | HR 76 | Ht 66.0 in | Wt 188.4 lb

## 2023-07-22 DIAGNOSIS — I5020 Unspecified systolic (congestive) heart failure: Secondary | ICD-10-CM | POA: Diagnosis not present

## 2023-07-22 DIAGNOSIS — I480 Paroxysmal atrial fibrillation: Secondary | ICD-10-CM | POA: Diagnosis not present

## 2023-07-22 DIAGNOSIS — Z79899 Other long term (current) drug therapy: Secondary | ICD-10-CM

## 2023-07-22 DIAGNOSIS — Z6830 Body mass index (BMI) 30.0-30.9, adult: Secondary | ICD-10-CM

## 2023-07-22 DIAGNOSIS — I700 Atherosclerosis of aorta: Secondary | ICD-10-CM

## 2023-07-22 DIAGNOSIS — Z95810 Presence of automatic (implantable) cardiac defibrillator: Secondary | ICD-10-CM

## 2023-07-22 DIAGNOSIS — I428 Other cardiomyopathies: Secondary | ICD-10-CM | POA: Diagnosis not present

## 2023-07-22 DIAGNOSIS — E66811 Obesity, class 1: Secondary | ICD-10-CM

## 2023-07-22 DIAGNOSIS — Z8673 Personal history of transient ischemic attack (TIA), and cerebral infarction without residual deficits: Secondary | ICD-10-CM

## 2023-07-22 MED ORDER — SEMAGLUTIDE-WEIGHT MANAGEMENT 2.4 MG/0.75ML ~~LOC~~ SOAJ: 2.4000 mg | SUBCUTANEOUS | 2 refills | Status: DC

## 2023-07-22 NOTE — Patient Instructions (Signed)
Medication Instructions:  Your Physician recommend you continue on your current medication as directed.    *If you need a refill on your cardiac medications before your next appointment, please call your pharmacy*  Lab Work: Your provider would like for you to have following labs drawn today CBC, CMET, Lipid panel, direct LDL.   If you have labs (blood work) drawn today and your tests are completely normal, you will receive your results only by: MyChart Message (if you have MyChart) OR A paper copy in the mail If you have any lab test that is abnormal or we need to change your treatment, we will call you to review the results.  Follow-Up: At [facility], you and your health needs are our priority.  As part of our continuing mission to provide you with exceptional heart care, we have created designated Provider Care Teams.  These Care Teams include your primary Cardiologist (physician) and Advanced Practice Providers (APPs -  Physician Assistants and Nurse Practitioners) who all work together to provide you with the care you need, when you need it.  We recommend signing up for the patient portal called "MyChart".  Sign up information is provided on this After Visit Summary.  MyChart is used to connect with patients for Virtual Visits (Telemedicine).  Patients are able to view lab/test results, encounter notes, upcoming appointments, etc.  Non-urgent messages can be sent to your provider as well.   To learn more about what you can do with MyChart, go to [email or web address].    Your next appointment:   6 month(s)  Provider:   You may see Debbe Odea, MD or one of the following Advanced Practice Providers on your designated Care Team:   Eula Listen, [place]

## 2023-07-23 LAB — CBC
Hematocrit: 39.6 % (ref 34.0–46.6)
Hemoglobin: 12.9 g/dL (ref 11.1–15.9)
MCH: 30.1 pg (ref 26.6–33.0)
MCHC: 32.6 g/dL (ref 31.5–35.7)
MCV: 92 fL (ref 79–97)
Platelets: 264 10*3/uL (ref 150–450)
RBC: 4.29 x10E6/uL (ref 3.77–5.28)
RDW: 14.3 % (ref 11.7–15.4)
WBC: 6.4 10*3/uL (ref 3.4–10.8)

## 2023-07-23 LAB — COMPREHENSIVE METABOLIC PANEL
ALT: 20 [IU]/L (ref 0–32)
AST: 24 [IU]/L (ref 0–40)
Albumin: 4.1 g/dL (ref 3.9–4.9)
Alkaline Phosphatase: 87 [IU]/L (ref 44–121)
BUN/Creatinine Ratio: 20 (ref 12–28)
BUN: 23 mg/dL (ref 8–27)
Bilirubin Total: 0.4 mg/dL (ref 0.0–1.2)
CO2: 19 mmol/L — ABNORMAL LOW (ref 20–29)
Calcium: 9.8 mg/dL (ref 8.7–10.3)
Chloride: 107 mmol/L — ABNORMAL HIGH (ref 96–106)
Creatinine, Ser: 1.13 mg/dL — ABNORMAL HIGH (ref 0.57–1.00)
Globulin, Total: 2.4 g/dL (ref 1.5–4.5)
Glucose: 87 mg/dL (ref 70–99)
Potassium: 4.4 mmol/L (ref 3.5–5.2)
Sodium: 141 mmol/L (ref 134–144)
Total Protein: 6.5 g/dL (ref 6.0–8.5)
eGFR: 53 mL/min/{1.73_m2} — ABNORMAL LOW (ref >59)

## 2023-07-23 LAB — LIPID PANEL
Chol/HDL Ratio: 2.1 ratio (ref 0.0–4.4)
Cholesterol, Total: 111 mg/dL (ref 100–199)
HDL: 53 mg/dL (ref >39)
LDL Chol Calc (NIH): 45 mg/dL (ref 0–99)
Triglycerides: 54 mg/dL (ref 0–149)
VLDL Cholesterol Cal: 13 mg/dL (ref 5–40)

## 2023-07-23 LAB — LDL CHOLESTEROL, DIRECT: LDL Direct: 49 mg/dL (ref 0–99)

## 2023-08-01 NOTE — Progress Notes (Signed)
Remote ICD transmission.   

## 2023-08-08 ENCOUNTER — Other Ambulatory Visit: Payer: Self-pay

## 2023-08-08 MED ORDER — APIXABAN 5 MG PO TABS: 5.0000 mg | ORAL_TABLET | Freq: Two times a day (BID) | ORAL | 1 refills | Status: DC

## 2023-08-08 NOTE — Telephone Encounter (Signed)
Prescription refill request for Eliquis received. Indication:afib Last office visit:11/24 Scr:1.13  11/24 Age: 67 Weight:85.5  kg  Prescription refilled

## 2023-08-08 NOTE — Telephone Encounter (Signed)
Refill request for Eliquis

## 2023-10-12 ENCOUNTER — Ambulatory Visit (INDEPENDENT_AMBULATORY_CARE_PROVIDER_SITE_OTHER): Payer: 59

## 2023-10-12 DIAGNOSIS — I428 Other cardiomyopathies: Secondary | ICD-10-CM | POA: Diagnosis not present

## 2023-10-12 LAB — CUP PACEART REMOTE DEVICE CHECK
Battery Remaining Longevity: 66 mo
Battery Remaining Percentage: 72 %
Brady Statistic RA Percent Paced: 13 %
Brady Statistic RV Percent Paced: 97 %
Date Time Interrogation Session: 20250129025100
HighPow Impedance: 55 Ohm
Implantable Lead Connection Status: 753985
Implantable Lead Connection Status: 753985
Implantable Lead Connection Status: 753985
Implantable Lead Implant Date: 20150301
Implantable Lead Implant Date: 20150301
Implantable Lead Implant Date: 20150301
Implantable Lead Location: 753858
Implantable Lead Location: 753859
Implantable Lead Location: 753860
Implantable Lead Model: 4470
Implantable Lead Model: 4677
Implantable Lead Model: 696
Implantable Lead Serial Number: 145508
Implantable Lead Serial Number: 501050
Implantable Lead Serial Number: 769161
Implantable Pulse Generator Implant Date: 20220113
Lead Channel Impedance Value: 317 Ohm
Lead Channel Impedance Value: 472 Ohm
Lead Channel Impedance Value: 557 Ohm
Lead Channel Setting Pacing Amplitude: 2 V
Lead Channel Setting Pacing Amplitude: 2.8 V
Lead Channel Setting Pacing Amplitude: 3.5 V
Lead Channel Setting Pacing Pulse Width: 0.5 ms
Lead Channel Setting Pacing Pulse Width: 1 ms
Lead Channel Setting Sensing Sensitivity: 0.6 mV
Lead Channel Setting Sensing Sensitivity: 1 mV
Pulse Gen Serial Number: 267431
Zone Setting Status: 755011

## 2023-10-16 ENCOUNTER — Encounter: Payer: Self-pay | Admitting: Cardiology

## 2023-11-03 ENCOUNTER — Other Ambulatory Visit (HOSPITAL_COMMUNITY): Payer: Self-pay

## 2023-11-03 ENCOUNTER — Telehealth: Payer: Self-pay

## 2023-11-03 NOTE — Telephone Encounter (Signed)
Pharmacy Patient Advocate Encounter   Received notification from CoverMyMeds that prior authorization for Nurtec 75MG  dispersible tablets is required/requested.   Insurance verification completed.   The patient is insured through [facility] .   Per test claim: The current 30 day co-pay is, $0.  No PA needed at this time. This test claim was processed through [facility]- copay amounts may vary at other pharmacies due to pharmacy/plan contracts, or as the patient moves through the different stages of their insurance plan.

## 2023-11-07 ENCOUNTER — Telehealth: Payer: Self-pay | Admitting: Physician Assistant

## 2023-11-07 NOTE — Telephone Encounter (Signed)
 Patient dropped off letter from [facility] in reference to [facility] Inj 2.4 MG formulary and possible changes that may affect refilling the prescription. Please contact patient via phone. Placed letter in nurse's box.

## 2023-11-08 NOTE — Telephone Encounter (Signed)
 Pt called and message left with phone number and last note from [place] explained

## 2023-11-12 ENCOUNTER — Other Ambulatory Visit: Payer: Self-pay | Admitting: Physician Assistant

## 2023-11-15 NOTE — Telephone Encounter (Signed)
 Pt's "Wegovy 2.4 mg" 'not covered by insurance' paperwork from insurance placed in the "pt pick-up box"  Pt called to apprise

## 2023-11-21 NOTE — Progress Notes (Signed)
 Remote ICD transmission.

## 2023-11-22 ENCOUNTER — Encounter: Payer: Self-pay | Admitting: Adult Health

## 2023-11-22 ENCOUNTER — Ambulatory Visit (INDEPENDENT_AMBULATORY_CARE_PROVIDER_SITE_OTHER): Payer: 59 | Admitting: Adult Health

## 2023-11-22 VITALS — BP 118/69 | HR 70 | Ht 66.0 in | Wt 167.0 lb

## 2023-11-22 DIAGNOSIS — I63411 Cerebral infarction due to embolism of right middle cerebral artery: Secondary | ICD-10-CM

## 2023-11-22 DIAGNOSIS — G44209 Tension-type headache, unspecified, not intractable: Secondary | ICD-10-CM | POA: Diagnosis not present

## 2023-11-22 DIAGNOSIS — G43711 Chronic migraine without aura, intractable, with status migrainosus: Secondary | ICD-10-CM

## 2023-11-22 MED ORDER — AJOVY 225 MG/1.5ML ~~LOC~~ SOAJ: 225.0000 mg | SUBCUTANEOUS | 11 refills | Status: DC

## 2023-11-22 MED ORDER — UBRELVY 100 MG PO TABS: 100.0000 mg | ORAL_TABLET | ORAL | 5 refills | Status: AC | PRN

## 2023-11-22 NOTE — Progress Notes (Signed)
 [facility] [address] [place]. [place] [address] [phone]       STROKE FOLLOW UP NOTE  Ms. NAVEAH BRAVE Date of Birth:  [birth year] Medical Record Number:  [id number]   Reason for Referral:  stroke follow up    SUBJECTIVE:   CHIEF COMPLAINT:  Chief Complaint  Patient presents with   Cerebrovascular Accident    Rm 3 alone Pt is well, reports she has been having some L sided weakness and her L hand will occasionally constrict. Has had two falls in last 6 months.     HPI:   Update 11/22/2023 JM: Patient returns for 69-year follow-up unaccompanied.  Overall stable from neurological standpoint.  No stroke/TIA symptoms.  Compliant on Eliquis and atorvastatin.  Routinely follows with PCP for stroke risk factor management.  Patient mentions significant increase stress over the past 3 to 4 months.  Reports her father has been hospitalized since 09-16-24, her mother passed away in Oct 18, 2023 and her brother moved in with her several months ago who has been difficult to care for as he has multiple health problems including Parkinson's disease, dementia, diabetes, and history of EtOH and drug abuse.  She has been having to miss work frequently due to her brothers multiple doctors appointments.  She notes gradually worsening of headaches since that time.  She has remained on topiramate 25 mg twice daily but stopped amitriptyline about 1 to 2 months ago as she feels she needs to be more alert/aware at night as her brother has been wandering throughout the house.  Has been using Nurtec but will repeat a dose about 60 min later and headache will finally resolve. She has not been sleeping well.  Headaches are associated with photophobia and phonophobia.  She has also noted increased tightness of right hand over the past several months, can become contracted into a fist where she has difficulty opening her fingers. She tries to ensure she uses her hand as much as possible  but does not do any specific hand exercises.  Denies any increased weakness, changes in speech, gait or vision.  Remains on Eliquis and atorvastatin.  Routinely follows with PCP and cardiology.     History provided for reference purposes only  Update 11/16/2022 JM: Patient returns for stroke follow-up visit accompanied by her granddaughter.    Overall stable without new stroke/TIA symptoms.  Reports residual mild left sided weakness, can fluctuate. Stable since prior visit.   Headaches remain well-controlled on topiramate 25mg  twice daily and amitriptyline 50mg  daily, tolerating without side effects. Will have about 2 migraine days per month, can have occasional mild headaches. Will use Excedrin migraine as needed for headaches.   Compliant on Eliquis and atorvastatin Blood pressure well-controlled Routinely follows with PCP and cardiology  Update 03/23/2022 JM: Patient returns for 69-month stroke follow-up unaccompanied .  Overall stable without new stroke/TIA symptoms.  Reports residual left arm weakness but improvement since prior visit, occasional LLE "slowness" but denies weakness.  Continues to maintain ADLs and IADLs apparently as well as working without difficulty.   Headaches have improved since prior visit and now typically only present with increased stress, has continued on topamax 25mg  twice daily and amitriptyline 50 mg daily, denies side effects.   Compliant on Eliquis and atorvastatin, denies side effects.  Blood pressure today 125/80.  Routinely followed by PCP and cardiology.  No further concerns at this time  Update 09/17/2021 JM:   Returns for 69-month stroke follow-up.  Overall stable.  Denies  new stroke/TIA symptoms.  Residual LUE weakness, difficulty with fine motor, weakness and limping with LLE.  Continues to maintain ADLs and majority of IADLs.  Returned back to work back in August without difficulty.    She is unsure what medications she currently takes as her sister  Teaching laboratory technician with meds. She has a box containing current meds (aspirin?, eliquis, amitriptyline, aldactone, prilosec, synthroid) and waiting to see PCP next week for refill of atorvastatin, losartan and metoprolol. Blood pressure today 138/90 - does not routinely monitor at home. Remains on amitriptyline for headache prevention - prescribed topamax from PMR but at some point ran out and not currently on - reports she was unable to obtain a refill.  Has headaches 1x weekly that start in her neck and shoot into her left occiput. Improved with Tylenol although taking almost daily 2-3 tablets 3-4x daily. Unknown if she used the Nurtec previously provided as sample. PCP rx'd tizanidine but she does not remember using this - does endorse neck pain as well increased stressors - admits to continued tobacco use. No further concerns at this time.    Update 03/09/2021 JM: Ms. Samad returns for 3-month stroke follow-up accompanied by her niece, [name].  She has been doing well since prior visit with improvement of residual deficits with some LUE weakness -continues to work with OT.  Impairment of her gait and LLE weakness currently ambulating without assistive device and has returned back to driving and majority of IADLs.  She is hopeful to return back to work in September/October as a [employer]. Headaches have improved on amitriptyline 50 mg nightly currently occurring 1x/month usually with increased stressors.  Also remains on topamax 50mg  BID.  Denies new stroke/TIA symptoms.  Since prior visit, she has been diagnosed with atrial fibrillation which was found on pacer interrogation and Eliquis 5 mg twice daily initiated which she has been tolerating without side effects.  She has also remained on atorvastatin 40 mg daily without associated side effects.  Blood pressure today 112/78.  Unfortunately, returned back to tobacco use approx 1 pack every 3-4 days due to increased stressors.  She has a trip planned to [place]  next month to stay with her brother for a while and is hopeful that stress levels can be at a minimum and plans on trying to quit smoking again.  No further concerns at this time.  Initial visit 11/25/2020 JM: Ms. Saez is being seen for hospital follow-up accompanied by her niece, [name]  Reports residual left sided weakness with sensory impairment, visual impairment, cognitive slowing, sensory impairment, and gait impairment -currently working with [facility]OT reporting continued improvement Use of RW for short distance and w/c for long distance -denies any recent falls Her greatest concern today is in regards to continued headaches which were present during admission -compliant on topiramate 50 mg twice daily.  Denies benefit with Fioricet.  FREQUENT use of tylenol - approx 8-9 500mg  tabs/day.  Reports headaches consistently throughout the day associated with photophobia and phonophobia Also struggling with increased anxiety and agitation -underlying history of anxiety/depression not currently on medication management - feels anxiety related to current medical condition and residual stroke deficits Denies new stroke/TIA symptoms  Compliant on aspirin 81 mg daily and atorvastatin 40 mg daily -denies side effects Blood pressure today 137/92 Reports complete tobacco cessation since hospital discharge  No further concerns at this time   Stroke admission 10/07/2020 PAISLYNN HEGSTROM is a 68 y.o. 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  She presented to [facility] ED on 10/07/2020 with severe headache, left-sided weakness and left facial droop.   Personally reviewed hospitalization pertinent progress notes, lab work and imaging with summary provided.  Initially evaluated by Dr. Roda Shutters with stroke work-up revealing acute ischemic right MCA stroke with right M2/MCA s/p IR with TICI 2C revascularization, concerning for cardioembolic source given cardiac history.  Unable to obtain MRI due to recent pacemaker switch.  ICD interrogation negative for A. fib.  2D echo showed EF 30 to 35%.  LDL 65.  A1c 6.1.  Current tobacco use of smoking cessation counseling provided.  Other stroke risk factors include nonischemic cardiomyopathy, myopericarditis, reported medication noncompliance, advanced age, obesity and prior stroke history.   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 Code Stroke CT head:  Hypodensity right frontal parietal lobe suspicious for acute infarct. In addition, hyperdense sign at right sylvian fissure MCA CTA head & neck : Right M2/MCA posterior division branch occlusion with moderate distal branch opacification via leptomeningeal collaterals.  MRI: Contraindicated due to recent pacemaker switch, per MRI technician need a minimum of 6 weeks from procedure. (09/25/2020) IR - R M2 inferior division TICI 2c revasc CT head post op 1/25 - unchanged CT repeat 10/09/2020: Subacute right frontoparietal and right temporal infarct.  SAH within the right sylvian fissure is unchanged CT repeat 10/13/2020: Right MCA distribution infarct stable in size.  No evidence for hemorrhagic transformation.  Decrease hypodensity within the right sylvian fissure. 2D Echo -  EF 30 to 35%.  ICD interrogation no afib - new ICD from 09/25/20 LDL 65 HgbA1c: 6.1 VTE prophylaxis -SCDs aspirin 81 mg daily prior to admission, now on aspirin 81 mg daily, started on 10/13/2020 after repeat CT scan Topamax 25 mg twice daily for headache Therapy recommendations: CIR  Disposition: Today to  CIR    ROS:   14 system review of systems performed and negative with exception of those listed in HPI  PMH:  Past Medical History:  Diagnosis Date   AICD (automatic cardioverter/defibrillator) present    COPD (chronic obstructive pulmonary disease) (HCC)    Hypothyroidism    Myocardial infarction (HCC)    Pacemaker/defibrillator    Stroke (HCC)     PSH:  Past Surgical History:  Procedure Laterality Date   ABDOMINAL HYSTERECTOMY     CHOLECYSTECTOMY     INSERT / REPLACE / REMOVE PACEMAKER     IR CT HEAD LTD  10/07/2020   IR PERCUTANEOUS ART THROMBECTOMY/INFUSION INTRACRANIAL INC DIAG ANGIO  10/07/2020   RADIOLOGY WITH ANESTHESIA N/A 10/07/2020   Procedure: IR WITH ANESTHESIA - CODE STROKE;  Surgeon: Radiologist, Medication, MD;  Location: [facility] OR;  Service: Radiology;  Laterality: N/A;   THYROIDECTOMY     TONSILLECTOMY      Social History:  Social History   Socioeconomic History   Marital status: Widowed    Spouse name: Not on file   Number of children: Not on file   Years of education: Not on file   Highest education level: Not on file  Occupational History   Not on file  Tobacco Use   Smoking status: Every  Day    Current packs/day: 2.00    Average packs/day: 2.0 packs/day for 15.0 years (30.0 ttl pk-yrs)    Types: Cigarettes   Smokeless tobacco: Never   Tobacco comments:    1 PPD- 08/17/2022  Vaping Use   Vaping status: Never Used  Substance and Sexual Activity   Alcohol use: No   Drug use: No   Sexual activity: Not Currently  Other Topics Concern   Not on file  Social History Narrative   09/17/21 lives with dad ad sister   Social Drivers of Health   Financial Resource Strain: Low Risk  (01/20/2022)   Received [facility], [facility]   Overall Financial Resource Strain (CARDIA)    Difficulty of Paying Living Expenses: Not hard at all  Food Insecurity: No Food Insecurity (01/20/2022)   Received from [facility], [facility]   Hunger  Vital Sign    Worried About Running Out of Food in the Last Year: Never true    Ran Out of Food in the Last Year: Never true  Transportation Needs: No Transportation Needs (01/20/2022)   Received from [facility], [facility]   [facility] - Transportation    Lack of Transportation (Medical): No    Lack of Transportation (Non-Medical): No  Physical Activity: Not on file  Stress: Not on file  Social Connections: Not on file  Intimate Partner Violence: Not on file    Family History:  Family History  Problem Relation Age of Onset   Diabetes Mother    Heart disease Mother 21       stent placed    Macular degeneration Father    Diabetes Brother    Diabetes Brother     Medications:   Current Outpatient Medications on File Prior to Visit  Medication Sig Dispense Refill   albuterol (PROVENTIL) (2.5 MG/3ML) 0.083% nebulizer solution Take 2.5 mg by nebulization as needed for wheezing or shortness of breath.     amitriptyline (ELAVIL) 50 MG tablet Take 1 tablet (50 mg total) by mouth at bedtime. 90 tablet 3   apixaban (ELIQUIS) 5 MG TABS tablet Take 1 tablet (5 mg total) by mouth 2 (two) times daily. 180 tablet 1   atorvastatin (LIPITOR) 40 MG tablet Take 1 tablet (40 mg total) by mouth daily. 90 tablet 3   diclofenac Sodium (VOLTAREN) 1 % GEL Apply topically.     levothyroxine (SYNTHROID) 88 MCG tablet Take 88 mcg by mouth daily.     losartan (COZAAR) 25 MG tablet Take 1 tablet (25 mg total) by mouth daily. 90 tablet 3   metoprolol succinate (TOPROL XL) 25 MG 24 hr tablet Take 1 tablet (25 mg total) by mouth daily. 30 tablet 1   omeprazole (PRILOSEC) 40 MG capsule Take 40 mg by mouth daily.     Rimegepant Sulfate (NURTEC) 75 MG TBDP Take 1 tablet (75 mg total) by mouth as needed (migraine). 16 tablet 5   spironolactone (ALDACTONE) 25 MG tablet Take 1 tablet (25 mg total) by mouth daily. 30 tablet 5   topiramate (TOPAMAX) 25 MG tablet Take 1 tablet (25 mg total) by mouth 2 (two) times  daily. 180 tablet 3   WEGOVY 2.4 MG/0.75ML SOAJ INJECT 2.4MG  SUBCUTANEOUSLY ONCE A WEEK 4 mL 0   No current facility-administered medications on file prior to visit.    Allergies:   Allergies  Allergen Reactions   Codeine Shortness Of Breath   Aspirin Other (See Comments)    "  burns" the patient's stomach   Latex Other (See Comments)    Makes the "skin peel"    Other Nausea Only and Other (See Comments)    Spicy foods- "Upset the patient's stomach"      OBJECTIVE:  Physical Exam  Vitals:   11/22/23 1344  BP: 118/69  Pulse: 70  Weight: 167 lb (75.8 kg)  Height: 5\' 6"  (1.676 m)   Body mass index is 26.95 kg/m. No results found.  General: well developed, well nourished,  pleasant middle-aged Caucasian female, seated, in no evident distress Head: head normocephalic and atraumatic.   Neck: supple with no carotid or supraclavicular bruits Cardiovascular: regular rate and rhythm, no murmurs Musculoskeletal: no deformity Skin:  no rash/petichiae Vascular:  Normal pulses all extremities   Neurologic Exam Mental Status: Awake and fully alert.  Fluent speech and language.  Oriented to place and time. Recent memory subjectively impaired and remote memory intact. Attention span, concentration and fund of knowledge appropriate appropriate during visit. Mood and affect appropriate.  Cranial Nerves: Pupils equal, briskly reactive to light. Extraocular movements full without nystagmus. Visual fields full to confrontation. Hearing intact. Facial sensation intact. Face, tongue, palate moves normally and symmetrically.  Motor: Normal strength, bulk and tone on right upper and lower extremity.  LUE: 4+/5 with decreased grip strength and hand dexterity although giveaway weakness noted; able to make a fist and open hand although slow LLE: 5/5 Sensory.:  intact to light touch, vibratory and pinprick sensation Coordination: Rapid alternating movements normal in all extremities except  slightly decreased decreased left hand. Finger-to-nose and heel-to-shin performed accurately bilaterally. Gait and Station: Stands from seated position without difficulty.  Gait demonstrates normal stride length and balance without use of assistive device.   Reflexes: 1+ and symmetric. Toes downgoing.         ASSESSMENT: VIANNA VENEZIA is a 68 y.o. year old female with history of right MCA infarct with right M2/MCA posterior division branch occlusion s/p IR with TICI 2C reperfusion, concerning for cardioembolic source given extensive cardiac history on 10/07/2020. Vascular risk factors include polysubstance abuse, chronic pain, nonischemic cardiomyopathy (recent EF 30 to 35%), s/p chamber replacement for pacemaker 09/25/2020, new dx of A.fib, history of postpartum stroke, tobacco use, HTN, pre-DM and obesity.     PLAN:  R MCA stroke:  Residual deficit: mild left sided deficits. Increased left hand spasticity suspect due to increased stress. Neuro exam intact compared to prior visit. Offered OT but due to busy schedule, declines interest.  She was provided exercises to do at home but if symptoms persist, consider referral to PMR for botox Continue Eliquis (apixaban) daily and atorvastatin 40mg  daily for secondary stroke prevention managed/prescribed by PCP/cardiology.   Discussed secondary stroke prevention measures and importance of close PCP follow up for aggressive stroke risk factor management including HTN with BP goal<130/90, HLD with LDL goal<70 and pre-DM with A1c goal<7   Chronic headaches Suspect mixed migrainous, tension and overuse headaches with gradual worsening like due to significant home stressors  Recommend starting Ajovy monthly injection, provided sample today Recommend trying Ubrelvy 100mg  for rescue, can repeat x1 after 2 hrs if needed. Max dose 200mg /24hrs. Discussed rebound headache with more frequent use.  Limited to no benefit with Nurtec.  Triptans contraindicated  d/t stroke history  Continue topiramate 25 mg twice daily, restart amitriptyline 50 mg nightly once able       Follow up in 6 months or call earlier if needed     CC:  PCP: Ennis Forts, MD    I spent 40 minutes of face-to-face and non-face-to-face time with patient.  This included previsit chart review, lab review, study review, electronic health record documentation, patient education and discussion regarding above diagnoses and treatment plan and answered all other questions to patient's satisfaction  Ihor Austin, [facility]  [facility] [address] [place], [place] [address]  Phone [phone] Fax [phone] Note: This document was prepared with digital dictation and possible smart phrase technology. Any transcriptional errors that result from this process are unintentional.

## 2023-11-22 NOTE — Patient Instructions (Addendum)
 Your Plan:  Start Ajovy monthly injection  Start Bernita Raisin as needed for migraine rescue, can repeat after 2 hours if needed, max dose 200mg /24 hours  Continue topamax and restart amitriptyline if needed  Do exercises at home to help with hand tightness, if symptoms persist please let me know      Follow up in 6 months or call earlier if needed,      Thank you for coming to see [place] at [facility]. I hope we have been able to provide you high quality care today.  You may receive a patient satisfaction survey over the next few weeks. We would appreciate your feedback and comments so that we may continue to improve ourselves and the health of our patients.

## 2023-12-06 ENCOUNTER — Other Ambulatory Visit: Payer: Self-pay | Admitting: Physician Assistant

## 2023-12-06 DIAGNOSIS — Z8673 Personal history of transient ischemic attack (TIA), and cerebral infarction without residual deficits: Secondary | ICD-10-CM

## 2023-12-06 DIAGNOSIS — Z6830 Body mass index (BMI) 30.0-30.9, adult: Secondary | ICD-10-CM

## 2023-12-06 NOTE — Telephone Encounter (Signed)
 Refill request

## 2023-12-07 ENCOUNTER — Other Ambulatory Visit (HOSPITAL_COMMUNITY): Payer: Self-pay

## 2023-12-08 NOTE — Telephone Encounter (Signed)
 Patient is calling to follow up regarding this. Please advise.

## 2023-12-09 NOTE — Telephone Encounter (Signed)
 The patient has been called back and a message (per DPR) was left on the pt's answering machine to call us back and remind pt to contact insurance to see what meds are covered and that she has paperwork in the pick basket to be picked-up.

## 2023-12-12 NOTE — Telephone Encounter (Signed)
 Called and spoke with patient. Let patient know that forms are in the office ready for pick up. Patient verbalized understanding. States that she will pick up forms today.

## 2023-12-12 NOTE — Telephone Encounter (Signed)
Patient is returning phone call. Please advise.

## 2023-12-13 ENCOUNTER — Telehealth: Payer: Self-pay | Admitting: Pharmacy Technician

## 2023-12-13 ENCOUNTER — Other Ambulatory Visit (HOSPITAL_COMMUNITY): Payer: Self-pay

## 2023-12-13 ENCOUNTER — Other Ambulatory Visit: Payer: Self-pay | Admitting: Emergency Medicine

## 2023-12-13 DIAGNOSIS — Z6830 Body mass index (BMI) 30.0-30.9, adult: Secondary | ICD-10-CM

## 2023-12-13 DIAGNOSIS — Z8673 Personal history of transient ischemic attack (TIA), and cerebral infarction without residual deficits: Secondary | ICD-10-CM

## 2023-12-13 MED ORDER — WEGOVY 2.4 MG/0.75ML ~~LOC~~ SOAJ: SUBCUTANEOUS | 2 refills | Status: DC

## 2023-12-13 NOTE — Telephone Encounter (Signed)
 Spoke with pt, she went to the pharmacy and the wegovy is still not approved. Spoke with [employer], they tried running the medication and it still shows that it needs a PA. Will make the pharm d team aware.

## 2023-12-13 NOTE — Telephone Encounter (Signed)
 [facility] Insurance calling in regards to prior auth being approved. Please advise

## 2023-12-13 NOTE — Telephone Encounter (Signed)
/  c aware we are resubmitting the PA.

## 2023-12-13 NOTE — Telephone Encounter (Signed)
 Called and notified patient of the following.  [facility] Insurance calling in regards to prior auth being approved    Patient Pension scheme manager. Patient requested a new prescription to be sent to pharmacy.

## 2023-12-13 NOTE — Telephone Encounter (Signed)
 Pt calling requesting cb to discuss missing info that insurance is requesting for Rebecca Singh - [facility]

## 2023-12-13 NOTE — Telephone Encounter (Signed)
 Pharmacy Patient Advocate Encounter   Received notification from Pt Calls Messages that prior authorization for wegovy is required/requested.   Insurance verification completed.   The patient is insured through [employer] .   Per test claim: PA required; PA submitted to above mentioned insurance via CoverMyMeds Key/confirmation #/EOC [id number] Status is pending

## 2023-12-14 NOTE — Telephone Encounter (Signed)
 Pharmacy Patient Advocate Encounter  Received notification from [facility] that Prior Authorization for wegovy has been APPROVED from 12/14/23 to 09/12/24. Spoke to pharmacy to process.Copay is $0.00.    PA #/Case ID/Reference #: [id number]

## 2023-12-21 ENCOUNTER — Telehealth: Payer: Self-pay | Admitting: Pharmacist

## 2023-12-21 NOTE — Telephone Encounter (Signed)
 Pharmacy Patient Advocate Encounter   Received notification from Patient Pharmacy that prior authorization for Ubrelvy 100MG  tablets is required/requested.   Insurance verification completed.   The patient is insured through [facility] .   Per test claim: PA required; PA submitted to above mentioned insurance via CoverMyMeds Key/confirmation #/EOC  [id number] Status is pending

## 2023-12-21 NOTE — Telephone Encounter (Signed)
 Pharmacy Patient Advocate Encounter  Received notification from [facility] that Prior Authorization for Ubrelvy 100MG  tablets has been APPROVED from 12/21/2023 to 09/12/2024   PA #/Case ID/Reference #:  [id number]

## 2024-01-09 NOTE — Progress Notes (Unsigned)
 Cardiology Office Note    Date:  01/10/2024   ID:  Rebecca Singh, DOB [birth year], MRN [id number]  PCP:  Thor Fling, MD  Cardiologist:  Constancia Delton, MD  Electrophysiologist:  Boyce Byes, MD   Chief Complaint: Follow-up  History of Present Illness:   Rebecca Singh is a 68 y.o. 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   Remote LHC in 2008 minimal CAD with EF 55%.  Adenosine stress test in 2009 showed no evidence of ischemia or scar with an EF of 45 to 50%.   She was previously followed by [facility] cardiology with device initially implanted in 11/2013 and revision a few days later.  Echo in 2015 showed an EF of 30 to 35%.  In 2016, echo showed an EF of 65 to 70% with normal RV systolic function and moderate LVH.  Echo in 2019 demonstrated an EF of 55 to 60% with grade 1 diastolic dysfunction.  Lexiscan  MPI in 2019 showed no evidence of ischemia.  She was admitted to the hospital in 09/2020 with severe headache and left-sided weakness.  She was found to have an acute right middle cerebral artery CVA and underwent emergent thrombectomy.  She has residual left-sided weakness from stroke.  Echo in 09/2020 showed an EF of 30 to 35%, grade 1 diastolic dysfunction, mildly reduced RV systolic function with normal ventricular cavity size and insufficiency.  She has since transitioned her care to Drs. Agbor-Etang and Marven Slimmer.  Echo from 05/2022 showed an EF of 35 to 40%, global hypokinesis, grade 1 diastolic dysfunction, normal RV systolic function and ventricular cavity size, and mild aortic insufficiency.  Financial constraints have limited GDMT including SGLT2 inhibitor and ARNI.  She was seen in the office in  06/2022 and was noted to have restarted smoking.  She was not sleeping well with noted daytime fatigue.  She was referred to pulmonology for PFTs which remain pending and consideration of sleep study.   She was seen in the office in 12/2021 with a weight that was down 6 pounds when compared to her visit in 06/2022.  She was interested in GLP-1 therapy.  She was not interested in moving forward with a sleep study.  She continued to smoke.  She was initiated on Wegovy .  She was most recently seen in the office in 07/2023 and was doing very well from a cardiac perspective.  Her weight was down 30 pounds when compared to her visit in 12/2022 with the addition of Wegovy , though indicated her insurance would no longer be covering GLP-1 therapy in 2025.   She comes in doing well from a cardiac perspective and is without symptoms of angina or cardiac decompensation.  She is under increased stress at home surrounding the health of her brother and recent passing of her mother.  With this that she has noted an increase in her migraines.  She is without progressive dyspnea, palpitations, dizziness, presyncope, or syncope.  No significant lower extremity swelling or progressive orthopnea.  Her weight is down another 23 pounds when compared to revisit in 07/2023.  This is intentional with lifestyle modification and GLP-1 therapy.  With weight loss that she continues to feel better from a functional status perspective.  She  is adherent to cardiac pharmacotherapy without off target effect.  No falls or symptoms concerning for bleeding.   Labs independently reviewed: 07/2023 - TC 111, TG 54, HDL 53, LDL 45, BUN 23, serum creatinine 1.13, potassium 4.4, albumin 4.1, AST/ALT normal, Hgb 12.9, PLT 264 09/2022 - magnesium 2.1 10/2021 - TSH normal  Past Medical History:  Diagnosis Date   AICD (automatic cardioverter/defibrillator) present    COPD (chronic obstructive pulmonary disease) (HCC)    Hypothyroidism    Myocardial  infarction (HCC)    Pacemaker/defibrillator    Stroke [facility])     Past Surgical History:  Procedure Laterality Date   ABDOMINAL HYSTERECTOMY     CHOLECYSTECTOMY     INSERT / REPLACE / REMOVE PACEMAKER     IR CT HEAD LTD  10/07/2020   IR PERCUTANEOUS ART THROMBECTOMY/INFUSION INTRACRANIAL INC DIAG ANGIO  10/07/2020   RADIOLOGY WITH ANESTHESIA N/A 10/07/2020   Procedure: IR WITH ANESTHESIA - CODE STROKE;  Surgeon: Radiologist, Medication, MD;  Location: [facility] OR;  Service: Radiology;  Laterality: N/A;   THYROIDECTOMY     TONSILLECTOMY      Current Medications: Current Meds  Medication Sig   amitriptyline  (ELAVIL ) 50 MG tablet Take 1 tablet (50 mg total) by mouth at bedtime.   apixaban  (ELIQUIS ) 5 MG TABS tablet Take 1 tablet (5 mg total) by mouth 2 (two) times daily.   atorvastatin  (LIPITOR) 40 MG tablet Take 1 tablet (40 mg total) by mouth daily.   diclofenac Sodium (VOLTAREN) 1 % GEL Apply topically.   Fremanezumab -vfrm (AJOVY ) 225 MG/1.5ML SOAJ Inject 225 mg into the skin every 30 (thirty) days.   levothyroxine (SYNTHROID) 88 MCG tablet Take 88 mcg by mouth daily.   losartan  (COZAAR ) 25 MG tablet Take 1 tablet (25 mg total) by mouth daily.   metoprolol  succinate (TOPROL  XL) 25 MG 24 hr tablet Take 1 tablet (25 mg total) by mouth daily.   omeprazole (PRILOSEC) 40 MG capsule Take 40 mg by mouth daily.   Semaglutide -Weight Management (WEGOVY ) 2.4 MG/0.75ML SOAJ INJECT 2.4MG  SUBCUTANEOUSLY ONCE A WEEK   spironolactone  (ALDACTONE ) 25 MG tablet Take 1 tablet (25 mg total) by mouth daily.   topiramate  (TOPAMAX ) 25 MG tablet Take 1 tablet (25 mg total) by mouth 2 (two) times daily.   Ubrogepant  (UBRELVY ) 100 MG TABS Take 1 tablet (100 mg total) by mouth as needed. Can repeat x1 after 2 hours if needed, max dose 200mg /24hr    Allergies:   Codeine, Aspirin , Latex, and Other   Social History   Socioeconomic History   Marital status: Widowed    Spouse name: Not on file   Number of children:  Not on file   Years of education: Not on file   Highest education level: Not on file  Occupational History   Not on file  Tobacco Use   Smoking status: Every Day    Current packs/day: 2.00    Average packs/day: 2.0 packs/day for 15.0 years (30.0 ttl pk-yrs)    Types: Cigarettes   Smokeless tobacco: Never   Tobacco comments:    1 PPD- 08/17/2022  Vaping Use   Vaping status: Never Used  Substance and Sexual Activity   Alcohol use: No   Drug use: No   Sexual activity: Not Currently  Other Topics Concern   Not on file  Social History Narrative   09/17/21 lives with dad ad sister   Social Drivers of Health   Financial Resource Strain: Low Risk  (01/20/2022)  Received from [facility], [facility]   Overall Financial Resource Strain (CARDIA)    Difficulty of Paying Living Expenses: Not hard at all  Food Insecurity: No Food Insecurity (01/20/2022)   Received from [facility], [facility]   Hunger Vital Sign    Worried About Running Out of Food in the Last Year: Never true    Ran Out of Food in the Last Year: Never true  Transportation Needs: No Transportation Needs (01/20/2022)   Received from [facility], [facility]   [facility] - Transportation    Lack of Transportation (Medical): No    Lack of Transportation (Non-Medical): No  Physical Activity: Not on file  Stress: Not on file  Social Connections: Not on file     Family History:  The patient's family history includes Diabetes in her brother, brother, and mother; Heart disease (age of onset: 69) in her mother; Macular degeneration in her father.  ROS:   12-point review of systems is negative unless otherwise noted in the HPI.   EKGs/Labs/Other Studies Reviewed:    Studies reviewed were summarized above. The additional studies were reviewed today:  Limited echo 05/19/2022: 1. Left ventricular ejection fraction, by estimation, is 35 to 40%. The  left ventricle has moderately decreased function. The  left ventricle  demonstrates global hypokinesis. Left ventricular diastolic parameters are  consistent with Grade I diastolic  dysfunction (impaired relaxation).   2. Right ventricular systolic function is normal. The right ventricular  size is normal.   3. Aortic valve regurgitation is mild.   Comparison(s): Previous LVEF reported as 30-35%.  __________   2D echo 10/09/2020: 1. Left ventricular ejection fraction, by estimation, is 30 to 35%. The  left ventricle has moderately decreased function. The left ventricle  demonstrates global hypokinesis. The left ventricular internal cavity size  was mildly dilated. There is mild  left ventricular hypertrophy. Left ventricular diastolic parameters are  consistent with Grade I diastolic dysfunction (impaired relaxation).   2. Right ventricular systolic function is mildly reduced. The right  ventricular size is normal. Tricuspid regurgitation signal is inadequate  for assessing PA pressure.   3. Left atrial size was mildly dilated.   4. The mitral valve is normal in structure. No evidence of mitral valve  regurgitation. No evidence of mitral stenosis.   5. The aortic valve is tricuspid. Aortic valve regurgitation is mild. No  aortic stenosis is present.   6. The inferior vena cava is normal in size with greater than 50%  respiratory variability, suggesting right atrial pressure of 3 mmHg.  __________   Lexiscan  MPI 11/17/2017: Pharmacological myocardial perfusion imaging study with no significant  Ischemia Small region of mild fixed defect in the distal inferior, apical region, likely secondary to significant GI uptake artifact Normal wall motion, EF estimated at 48% (depressed secondary to GI uptake artifact) No EKG changes concerning for ischemia at peak stress or in recovery. EKG showing paced rhythm Low risk scan, Echocardiogram documenting normal ejection fraction, EF greater than 55%  __________   2D echo 11/16/2017: - Left  ventricle: The cavity size was normal. Wall thickness was    normal. Systolic function was normal. The estimated ejection    fraction was in the range of 55% to 60%. Wall motion was normal;    there were no regional wall motion abnormalities. Doppler    parameters are consistent with abnormal left ventricular    relaxation (grade 1 diastolic dysfunction).  - Aortic valve:  There was trivial regurgitation.   Impressions:   - Normal LVF    Normal Wall Motion    EF=55-60%    Normal Right side    Pacer wire in RV. Normal study. No cardiac source of emboli was    indentified.    EKG:  EKG is ordered today.  The EKG ordered today demonstrates paced rhythm, 56 bpm  Recent Labs: 07/22/2023: ALT 20; BUN 23; Creatinine, Ser 1.13; Hemoglobin 12.9; Platelets 264; Potassium 4.4; Sodium 141  Recent Lipid Panel    Component Value Date/Time   CHOL 111 07/22/2023 1606   TRIG 54 07/22/2023 1606   HDL 53 07/22/2023 1606   CHOLHDL 2.1 07/22/2023 1606   CHOLHDL 2.5 10/08/2020 0443   VLDL 10 10/08/2020 0443   LDLCALC 45 07/22/2023 1606   LDLDIRECT 49 07/22/2023 1606    PHYSICAL EXAM:    VS:  BP 110/81 (BP Location: Left Arm, Patient Position: Sitting, Cuff Size: Normal)   Pulse (!) 56   Ht 5\' 5"  (1.651 m)   Wt 165 lb 6.4 oz (75 kg)   SpO2 97%   BMI 27.52 kg/m   BMI: Body mass index is 27.52 kg/m.  Physical Exam Vitals reviewed.  Constitutional:      Appearance: She is well-developed.  HENT:     Head: Normocephalic and atraumatic.  Eyes:     General:        Right eye: No discharge.        Left eye: No discharge.  Cardiovascular:     Rate and Rhythm: Normal rate and regular rhythm.     Heart sounds: Normal heart sounds, S1 normal and S2 normal. Heart sounds not distant. No midsystolic click and no opening snap. No murmur heard.    No friction rub.  Pulmonary:     Effort: Pulmonary effort is normal. No respiratory distress.     Breath sounds: Normal breath sounds. No decreased  breath sounds, wheezing, rhonchi or rales.  Chest:     Chest wall: No tenderness.  Musculoskeletal:     Cervical back: Normal range of motion.     Right lower leg: No edema.     Left lower leg: No edema.  Skin:    General: Skin is warm and dry.     Nails: There is no clubbing.  Neurological:     Mental Status: She is alert and oriented to person, place, and time.  Psychiatric:        Speech: Speech normal.        Behavior: Behavior normal.        Thought Content: Thought content normal.        Judgment: Judgment normal.     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  Initial EF 30 to 35% with most recent EF 35 to 40% in 2023.  Financial constraints have precluded usage of ARNI or SGLT2 inhibitor. She remains on losartan , Toprol -XL, and spironolactone . Not requiring standing loop diuretic.  Update echo in 5 months.  No device alarms or shocks. CHF education. Follow-up with EP for ongoing management of CRT as directed.   PAF: Paced rhythm.  Remains on Toprol -XL 25 mg.  CHA2DS2-VASc at least 6 (CHF, age  x 1, stroke x 2, vascular disease, sex category).  She remains on rivaroxaban 5 mg twice daily does not be reduced dosing criteria.  No falls or symptoms concerning for bleeding.  Recent labs stable.  History of CVA: No new deficits.  She remains on apixaban  and atorvastatin .  Aortic atherosclerosis: LDL 45 in 07/2023 with normal AST/ALT at that time.  She remains on atorvastatin .  Obesity: Continued weight loss is encouraged through heart healthy diet and regular exercise.  Remains on Wegovy .    Disposition: F/u with Dr. Junnie Olives or an APP in 6 months, and EP as directed.    Medication Adjustments/Labs and Tests Ordered: Current medicines are reviewed  at length with the patient today.  Concerns regarding medicines are outlined above. Medication changes, Labs and Tests ordered today are summarized above and listed in the Patient Instructions accessible in Encounters.   Signed, Varney Gentleman, PA-C 01/10/2024 12:49 PM     [facility] [address] [place], [place] [address] [phone]

## 2024-01-10 ENCOUNTER — Ambulatory Visit: Payer: 59 | Attending: Physician Assistant | Admitting: Physician Assistant

## 2024-01-10 ENCOUNTER — Encounter: Payer: Self-pay | Admitting: Physician Assistant

## 2024-01-10 VITALS — BP 110/81 | HR 56 | Ht 65.0 in | Wt 165.4 lb

## 2024-01-10 DIAGNOSIS — I428 Other cardiomyopathies: Secondary | ICD-10-CM | POA: Diagnosis not present

## 2024-01-10 DIAGNOSIS — I5020 Unspecified systolic (congestive) heart failure: Secondary | ICD-10-CM

## 2024-01-10 DIAGNOSIS — Z95810 Presence of automatic (implantable) cardiac defibrillator: Secondary | ICD-10-CM | POA: Diagnosis not present

## 2024-01-10 DIAGNOSIS — I480 Paroxysmal atrial fibrillation: Secondary | ICD-10-CM | POA: Diagnosis not present

## 2024-01-10 DIAGNOSIS — I700 Atherosclerosis of aorta: Secondary | ICD-10-CM

## 2024-01-10 DIAGNOSIS — Z8673 Personal history of transient ischemic attack (TIA), and cerebral infarction without residual deficits: Secondary | ICD-10-CM

## 2024-01-10 NOTE — Patient Instructions (Addendum)
 Medication Instructions:  Your Physician recommend you continue on your current medication as directed.    *If you need a refill on your cardiac medications before your next appointment, please call your pharmacy*  Lab Work: No labs ordered today  If you have labs (blood work) drawn today and your tests are completely normal, you will receive your results only by: MyChart Message (if you have MyChart) OR A paper copy in the mail If you have any lab test that is abnormal or we need to change your treatment, we will call you to review the results.  Testing/Procedures: ECHO in 5 months  Your physician has requested that you have an echocardiogram. Echocardiography is a painless test that uses sound waves to create images of your heart. It provides your doctor with information about the size and shape of your heart and how well your heart's chambers and valves are working.   You may receive an ultrasound enhancing agent through an IV if needed to better visualize your heart during the echo. This procedure takes approximately one hour.  There are no restrictions for this procedure.  This will take place at [address] [facility], [place] [address]  Please note: We ask at that you not bring children with you during ultrasound (echo/ vascular) testing. Due to room size and safety concerns, children are not allowed in the ultrasound rooms during exams. Our front office staff cannot provide observation of children in our lobby area while testing is being conducted. An adult accompanying a patient to their appointment will only be allowed in the ultrasound room at the discretion of the ultrasound technician under special circumstances. We apologize for any inconvenience.   Follow-Up: At [facility], you and your health needs are our priority.  As part of our continuing mission to provide you with exceptional heart care, our providers are all part of one team.  This  team includes your primary Cardiologist (physician) and Advanced Practice Providers or APPs (Physician Assistants and Nurse Practitioners) who all work together to provide you with the care you need, when you need it.  Your next appointment:   6 month(s)  Provider:   Varney Gentleman, PA-C

## 2024-01-11 ENCOUNTER — Ambulatory Visit (INDEPENDENT_AMBULATORY_CARE_PROVIDER_SITE_OTHER): Payer: Medicare HMO

## 2024-01-11 DIAGNOSIS — I428 Other cardiomyopathies: Secondary | ICD-10-CM

## 2024-01-11 LAB — CUP PACEART REMOTE DEVICE CHECK
Battery Remaining Longevity: 60 mo
Battery Remaining Percentage: 71 %
Brady Statistic RA Percent Paced: 13 %
Brady Statistic RV Percent Paced: 97 %
Date Time Interrogation Session: 20250430025100
HighPow Impedance: 60 Ohm
Implantable Lead Connection Status: 753985
Implantable Lead Connection Status: 753985
Implantable Lead Connection Status: 753985
Implantable Lead Implant Date: 20150301
Implantable Lead Implant Date: 20150301
Implantable Lead Implant Date: 20150301
Implantable Lead Location: 753858
Implantable Lead Location: 753859
Implantable Lead Location: 753860
Implantable Lead Model: 4470
Implantable Lead Model: 4677
Implantable Lead Model: 696
Implantable Lead Serial Number: 145508
Implantable Lead Serial Number: 501050
Implantable Lead Serial Number: 769161
Implantable Pulse Generator Implant Date: 20220113
Lead Channel Impedance Value: 378 Ohm
Lead Channel Impedance Value: 485 Ohm
Lead Channel Impedance Value: 671 Ohm
Lead Channel Setting Pacing Amplitude: 2 V
Lead Channel Setting Pacing Amplitude: 2.8 V
Lead Channel Setting Pacing Amplitude: 3.5 V
Lead Channel Setting Pacing Pulse Width: 0.5 ms
Lead Channel Setting Pacing Pulse Width: 1 ms
Lead Channel Setting Sensing Sensitivity: 0.6 mV
Lead Channel Setting Sensing Sensitivity: 1 mV
Pulse Gen Serial Number: 267431
Zone Setting Status: 755011

## 2024-01-15 ENCOUNTER — Encounter: Payer: Self-pay | Admitting: Cardiology

## 2024-01-24 ENCOUNTER — Telehealth: Payer: Self-pay

## 2024-01-24 ENCOUNTER — Other Ambulatory Visit (HOSPITAL_COMMUNITY): Payer: Self-pay

## 2024-01-24 DIAGNOSIS — G43711 Chronic migraine without aura, intractable, with status migrainosus: Secondary | ICD-10-CM

## 2024-01-24 MED ORDER — EMGALITY 120 MG/ML ~~LOC~~ SOAJ: 120.0000 mg | SUBCUTANEOUS | 11 refills | Status: DC

## 2024-01-24 MED ORDER — EMGALITY 120 MG/ML ~~LOC~~ SOAJ: 240.0000 mg | Freq: Once | SUBCUTANEOUS | 0 refills | Status: AC

## 2024-01-24 NOTE — Telephone Encounter (Signed)
 Order placed initiate Emgality.  She will for start with loading dose of 240 mg (2 injections) and will then proceed with 120mg  dose every 30 days.

## 2024-01-24 NOTE — Telephone Encounter (Signed)
 Pharmacy Patient Advocate Encounter   Received notification from Fax that prior authorization for AJOVY  (fremanezumab -vfrm) injection 225MG /1.5ML auto-injectors is required/requested.   Insurance verification completed.   The patient is insured through [facility] .   Per test claim:  AIMOVIG, EMGALITY AND QULIPTA is preferred by the insurance.  If suggested medication is appropriate, Please send in a new RX and discontinue this one. If not, please advise as to why it's not appropriate so that we may request a Prior Authorization. Please note, some preferred medications may still require a PA.  If the suggested medications have not been trialed and there are no contraindications to their use, the PA will not be submitted, as it will not be approved.

## 2024-01-25 ENCOUNTER — Other Ambulatory Visit (HOSPITAL_COMMUNITY): Payer: Self-pay

## 2024-01-25 ENCOUNTER — Telehealth: Payer: Self-pay | Admitting: Pharmacist

## 2024-01-25 NOTE — Telephone Encounter (Signed)
 Contacted pt to inform her of medication change due to insurance. Advised we will try Emgality, she verbally understood.

## 2024-01-25 NOTE — Telephone Encounter (Signed)
 Pharmacy Patient Advocate Encounter   Received notification from Physician's Office that prior authorization for Emgality 120MG /ML auto-injectors (migraine) is required/requested.   Insurance verification completed.   The patient is insured through [facility] .   Per test claim: PA required; PA submitted to above mentioned insurance via CoverMyMeds Key/confirmation #/EOC [id number] Status is pending

## 2024-01-25 NOTE — Telephone Encounter (Signed)
 Noted, pt is aware

## 2024-01-25 NOTE — Telephone Encounter (Signed)
 Pharmacy Patient Advocate Encounter  Received notification from [facility] that Prior Authorization for Emgality 120MG /ML auto-injectors (migraine) has been APPROVED from 01/25/2024 to 09/12/2024. Ran test claim, Copay is $0.00. This test claim was processed through [facility]- copay amounts may vary at other pharmacies due to pharmacy/plan contracts, or as the patient moves through the different stages of their insurance plan.   PA #/Case ID/Reference #: [id number]

## 2024-01-28 ENCOUNTER — Other Ambulatory Visit: Payer: Self-pay | Admitting: Adult Health

## 2024-02-09 ENCOUNTER — Other Ambulatory Visit: Payer: Self-pay | Admitting: Adult Health

## 2024-02-21 ENCOUNTER — Other Ambulatory Visit: Payer: Self-pay | Admitting: Adult Health

## 2024-02-23 NOTE — Progress Notes (Signed)
 Remote ICD transmission.

## 2024-03-03 ENCOUNTER — Other Ambulatory Visit: Payer: Self-pay | Admitting: Adult Health

## 2024-03-03 DIAGNOSIS — G43711 Chronic migraine without aura, intractable, with status migrainosus: Secondary | ICD-10-CM

## 2024-03-08 ENCOUNTER — Other Ambulatory Visit: Payer: Self-pay | Admitting: Physician Assistant

## 2024-03-08 DIAGNOSIS — Z8673 Personal history of transient ischemic attack (TIA), and cerebral infarction without residual deficits: Secondary | ICD-10-CM

## 2024-03-08 DIAGNOSIS — Z6830 Body mass index (BMI) 30.0-30.9, adult: Secondary | ICD-10-CM

## 2024-04-01 ENCOUNTER — Other Ambulatory Visit: Payer: Self-pay | Admitting: Cardiology

## 2024-04-01 ENCOUNTER — Other Ambulatory Visit: Payer: Self-pay | Admitting: Physician Assistant

## 2024-04-01 DIAGNOSIS — Z6830 Body mass index (BMI) 30.0-30.9, adult: Secondary | ICD-10-CM

## 2024-04-01 DIAGNOSIS — Z8673 Personal history of transient ischemic attack (TIA), and cerebral infarction without residual deficits: Secondary | ICD-10-CM

## 2024-04-02 NOTE — Telephone Encounter (Signed)
 Prescription refill request for Eliquis  received. Indication:afib Last office visit:4/25 Scr:1.13  11/24 Age: 68 Weight:75  kg  Prescription refilled

## 2024-04-11 ENCOUNTER — Ambulatory Visit: Payer: Medicare HMO

## 2024-04-11 DIAGNOSIS — I428 Other cardiomyopathies: Secondary | ICD-10-CM | POA: Diagnosis not present

## 2024-04-12 ENCOUNTER — Telehealth: Payer: Self-pay

## 2024-04-12 ENCOUNTER — Ambulatory Visit: Payer: Self-pay | Admitting: Cardiology

## 2024-04-12 LAB — CUP PACEART REMOTE DEVICE CHECK
Battery Remaining Longevity: 60 mo
Battery Remaining Percentage: 66 %
Brady Statistic RA Percent Paced: 13 %
Brady Statistic RV Percent Paced: 97 %
Date Time Interrogation Session: 20250730025200
HighPow Impedance: 47 Ohm
Implantable Lead Connection Status: 753985
Implantable Lead Connection Status: 753985
Implantable Lead Connection Status: 753985
Implantable Lead Implant Date: 20150301
Implantable Lead Implant Date: 20150301
Implantable Lead Implant Date: 20150301
Implantable Lead Location: 753858
Implantable Lead Location: 753859
Implantable Lead Location: 753860
Implantable Lead Model: 4470
Implantable Lead Model: 4677
Implantable Lead Model: 696
Implantable Lead Serial Number: 145508
Implantable Lead Serial Number: 501050
Implantable Lead Serial Number: 769161
Implantable Pulse Generator Implant Date: 20220113
Lead Channel Impedance Value: 320 Ohm
Lead Channel Impedance Value: 440 Ohm
Lead Channel Impedance Value: 456 Ohm
Lead Channel Setting Pacing Amplitude: 2 V
Lead Channel Setting Pacing Amplitude: 2.8 V
Lead Channel Setting Pacing Amplitude: 3.5 V
Lead Channel Setting Pacing Pulse Width: 0.5 ms
Lead Channel Setting Pacing Pulse Width: 1 ms
Lead Channel Setting Sensing Sensitivity: 0.6 mV
Lead Channel Setting Sensing Sensitivity: 1 mV
Pulse Gen Serial Number: 267431
Zone Setting Status: 755011

## 2024-04-12 NOTE — Telephone Encounter (Signed)
 Scheduled transmission received:  ICD Scheduled remote reviewed. Normal device function.  Presenting rhythm: AP-AS/LVP/RVP with intermittent LV LOC, cannot exclude atrial LOC. Pacing outputs are programmed Fixed. Routed to alert group for review.   Agree with findings.  Pt last seen for device check 2023.  Needs next available appointment in [facility] for assessment.  Pt is not dependent per last check.  Left detailed message requesting call back. Next available in [place]:  Dr. Cindie August 20th at 2:00 pm.

## 2024-04-13 NOTE — Telephone Encounter (Signed)
 Left message for patient to call back

## 2024-04-16 NOTE — Telephone Encounter (Signed)
 Patient returned my call. She states d/t her work schedule, she is not able to come in on Wednesdays - most appointments have to be made on Tuesdays. Soonest w/ Suzann/Lambert in the 9/23 appt that is already made for patient.

## 2024-04-16 NOTE — Telephone Encounter (Signed)
 Patient returned call and reached one of the [facility] Schedulers - patient was scheduled to see Suzann Riddle, NP on 9/23.   Lvmtcb to reschedule for patient to see Dr. Cindie 8/20, as stated in message below.

## 2024-04-16 NOTE — Telephone Encounter (Signed)
 I will let Rebecca Singh advise further incase she is ok with Dr. Cindie double booking.

## 2024-04-19 NOTE — Telephone Encounter (Signed)
 Spoke with the patient and scheduled her for an appointment with Dr. Cindie on 8/14.

## 2024-04-26 ENCOUNTER — Ambulatory Visit: Attending: Cardiology | Admitting: Cardiology

## 2024-04-26 ENCOUNTER — Encounter: Payer: Self-pay | Admitting: Cardiology

## 2024-04-26 VITALS — BP 106/66 | HR 63 | Ht 65.5 in | Wt 157.0 lb

## 2024-04-26 DIAGNOSIS — Z95810 Presence of automatic (implantable) cardiac defibrillator: Secondary | ICD-10-CM | POA: Diagnosis not present

## 2024-04-26 DIAGNOSIS — I428 Other cardiomyopathies: Secondary | ICD-10-CM | POA: Diagnosis not present

## 2024-04-26 DIAGNOSIS — I5020 Unspecified systolic (congestive) heart failure: Secondary | ICD-10-CM

## 2024-04-26 NOTE — Patient Instructions (Signed)
 Medication Instructions:  Your physician recommends that you continue on your current medications as directed. Please refer to the Current Medication list given to you today.  *If you need a refill on your cardiac medications before your next appointment, please call your pharmacy*   Follow-Up: At [facility], you and your health needs are our priority.  As part of our continuing mission to provide you with exceptional heart care, our providers are all part of one team.  This team includes your primary Cardiologist (physician) and Advanced Practice Providers or APPs (Physician Assistants and Nurse Practitioners) who all work together to provide you with the care you need, when you need it.  Your next appointment:   1 year(s)  Provider:   You will see one of the following Advanced Practice Providers on your designated Care Team:   Charlies Arthur, PA-C Michael Andy Tillery, PA-C Suzann Riddle, NP Daphne Barrack, NP

## 2024-04-26 NOTE — Progress Notes (Signed)
  Electrophysiology Office Follow up Visit Note:    Date:  04/26/2024   ID:  Rebecca Singh, DOB 1956-02-27, MRN [id number]  PCP:  Lizette Lango, MD  [facility] Cardiologist:  Redell Cave, MD  [facility] Electrophysiologist:  OLE ONEIDA HOLTS, MD    Interval History:     Rebecca Singh is a 68 y.o. female who presents for a follow up visit.   I last saw the patient June 16, 2022.  She has a CRT-D that was implanted at [facility].  She also has a history of atrial fibrillation on Eliquis .  There is a note from device clinic questioning whether or not there is capture on the atrial lead.  She presents today to have her device interrogated.      Past medical, surgical, social and family history were reviewed.  ROS:   Please see the history of present illness.    All other systems reviewed and are negative.  EKGs/Labs/Other Studies Reviewed:    The following studies were reviewed today:  April 26, 2024 in-clinic device interrogation personally reviewed Battery and lead parameter stable. Atrial lead parameters are stable. Auto threshold does not work on the atrial lead.  Programmed fixed output at 3.5.   EKG Interpretation Date/Time:  Thursday April 26 2024 14:12:27 EDT Ventricular Rate:  63 PR Interval:  126 QRS Duration:  164 QT Interval:  516 QTC Calculation: 528 R Axis:   -41  Text Interpretation: Atrial-sensed ventricular-paced rhythm Confirmed by HOLTS OLE [phone]) on 04/26/2024 2:15:20 PM    Physical Exam:    VS:  BP 106/66   Pulse 63   Ht 5' 5.5 (1.664 m)   Wt 157 lb (71.2 kg)   SpO2 98%   BMI 25.73 kg/m     Wt Readings from Last 3 Encounters:  04/26/24 157 lb (71.2 kg)  01/10/24 165 lb 6.4 oz (75 kg)  11/22/23 167 lb (75.8 kg)     GEN: no distress CARD: RRR, No MRG.  Device pocket well-healed RESP: No IWOB. CTAB.      ASSESSMENT:    1. NICM (nonischemic cardiomyopathy) (HCC)   2. HFrEF (heart failure with  reduced ejection fraction) (HCC)   3. Cardiac resynchronization therapy defibrillator (CRT-D) in place    PLAN:    In order of problems listed above:  #Chronic systolic heart failure #Nonischemic cardiomyopathy #CRT-D in situ NYHA class II.  Warm and dry on exam.  #Paroxysmal atrial fibrillation On Eliquis  for stroke prophylaxis      Signed, OLE HOLTS, MD, [facility], [facility] 04/26/2024 2:28 PM    Electrophysiology [facility]

## 2024-05-29 ENCOUNTER — Encounter: Payer: Self-pay | Admitting: Adult Health

## 2024-05-29 ENCOUNTER — Ambulatory Visit: Admitting: Adult Health

## 2024-05-29 NOTE — Progress Notes (Deleted)
 [facility] [address] [place]. [place] [address] [phone]       STROKE FOLLOW UP NOTE  Ms. TAKAYLA BAILLIE Date of Birth:  [birth year] Medical Record Number:  [id number]   Reason for Referral:  stroke follow up    SUBJECTIVE:   CHIEF COMPLAINT:  No chief complaint on file.    HPI:   Update 05/29/2024 JM: Patient returns for 18-month follow-up visit.  Stable from stroke standpoint without new stroke/TIA symptoms.  Continued mild left-sided deficits.  Remains on Eliquis  and atorvastatin  without side effects.  Routinely follows with PCP and cardiology.  Previously complained of worsening headaches possibly in setting of significant increase stressors.  At prior visit, recommended initiating Ajovy  but insurance required trial of Emgality  first in addition to topiramate  25 mg twice daily and started Ubrelvy  for rescue.       History provided for reference purposes only  Update 11/22/2023 JM: Patient returns for yearly follow-up unaccompanied.  Overall stable from neurological standpoint.  No stroke/TIA symptoms.  Compliant on Eliquis  and atorvastatin .  Routinely follows with PCP for stroke risk factor management.  Patient mentions significant increase stress over the past 3 to 4 months.  Reports her father has been hospitalized since 09-Sep-2024, her mother passed away in 2023/10/11 and her brother moved in with her several months ago who has been difficult to care for as he has multiple health problems including Parkinson's disease, dementia, diabetes, and history of EtOH and drug abuse.  She has been having to miss work frequently due to her brothers multiple doctors appointments.  She notes gradually worsening of headaches since that time.  She has remained on topiramate  25 mg twice daily but stopped amitriptyline  about 1 to 2 months ago as she feels she needs to be more alert/aware at night as her brother has been wandering throughout the house.  Has been  using Nurtec but will repeat a dose about 60 min later and headache will finally resolve. She has not been sleeping well.  Headaches are associated with photophobia and phonophobia.  She has also noted increased tightness of right hand over the past several months, can become contracted into a fist where she has difficulty opening her fingers. She tries to ensure she uses her hand as much as possible but does not do any specific hand exercises.  Denies any increased weakness, changes in speech, gait or vision.  Remains on Eliquis  and atorvastatin .  Routinely follows with PCP and cardiology.  Update 11/16/2022 JM: Patient returns for stroke follow-up visit accompanied by her granddaughter.    Overall stable without new stroke/TIA symptoms.  Reports residual mild left sided weakness, can fluctuate. Stable since prior visit.   Headaches remain well-controlled on topiramate  25mg  twice daily and amitriptyline  50mg  daily, tolerating without side effects. Will have about 2 migraine days per month, can have occasional mild headaches. Will use Excedrin migraine as needed for headaches.   Compliant on Eliquis  and atorvastatin  Blood pressure well-controlled Routinely follows with PCP and cardiology  Update 03/23/2022 JM: Patient returns for 13-month stroke follow-up unaccompanied .  Overall stable without new stroke/TIA symptoms.  Reports residual left arm weakness but improvement since prior visit, occasional LLE slowness but denies weakness.  Continues to maintain ADLs and IADLs apparently as well as working without difficulty.   Headaches have improved since prior visit and now typically only present with increased stress, has continued on topamax  25mg  twice daily and amitriptyline  50 mg daily, denies side effects.  Compliant on Eliquis  and atorvastatin , denies side effects.  Blood pressure today 125/80.  Routinely followed by PCP and cardiology.  No further concerns at this time  Update 09/17/2021 JM:    Returns for 28-month stroke follow-up.  Overall stable.  Denies new stroke/TIA symptoms.  Residual LUE weakness, difficulty with fine motor, weakness and limping with LLE.  Continues to maintain ADLs and majority of IADLs.  Returned back to work back in August without difficulty.    She is unsure what medications she currently takes as her sister Teaching laboratory technician with meds. She has a box containing current meds (aspirin ?, eliquis , amitriptyline , aldactone , prilosec, synthroid) and waiting to see PCP next week for refill of atorvastatin , losartan  and metoprolol . Blood pressure today 138/90 - does not routinely monitor at home. Remains on amitriptyline  for headache prevention - prescribed topamax  from PMR but at some point ran out and not currently on - reports she was unable to obtain a refill.  Has headaches 1x weekly that start in her neck and shoot into her left occiput. Improved with Tylenol  although taking almost daily 2-3 tablets 3-4x daily. Unknown if she used the Nurtec previously provided as sample. PCP rx'd tizanidine but she does not remember using this - does endorse neck pain as well increased stressors - admits to continued tobacco use. No further concerns at this time.    Update 03/09/2021 JM: Ms. Creswell returns for 40-month stroke follow-up accompanied by her niece, Silvano.  She has been doing well since prior visit with improvement of residual deficits with some LUE weakness -continues to work with OT.  Impairment of her gait and LLE weakness currently ambulating without assistive device and has returned back to driving and majority of IADLs.  She is hopeful to return back to work in September/October as a [employer]. Headaches have improved on amitriptyline  50 mg nightly currently occurring 1x/month usually with increased stressors.  Also remains on topamax  50mg  BID.  Denies new stroke/TIA symptoms.  Since prior visit, she has been diagnosed with atrial fibrillation which was found on pacer  interrogation and Eliquis  5 mg twice daily initiated which she has been tolerating without side effects.  She has also remained on atorvastatin  40 mg daily without associated side effects.  Blood pressure today 112/78.  Unfortunately, returned back to tobacco use approx 1 pack every 3-4 days due to increased stressors.  She has a trip planned to [place]  next month to stay with her brother for a while and is hopeful that stress levels can be at a minimum and plans on trying to quit smoking again.  No further concerns at this time.  Initial visit 11/25/2020 JM: Ms. Theissen is being seen for hospital follow-up accompanied by her niece, Silvano  Reports residual left sided weakness with sensory impairment, visual impairment, cognitive slowing, sensory impairment, and gait impairment -currently working with [facility] reporting continued improvement Use of RW for short distance and w/c for long distance -denies any recent falls Her greatest concern today is in regards to continued headaches which were present during admission -compliant on topiramate  50 mg twice daily.  Denies benefit with Fioricet .  FREQUENT use of tylenol  - approx 8-9 500mg  tabs/day.  Reports headaches consistently throughout the day associated with photophobia and phonophobia Also struggling with increased anxiety and agitation -underlying history of anxiety/depression not currently on medication management - feels anxiety related to current medical condition and residual stroke deficits Denies new stroke/TIA symptoms  Compliant on aspirin  81 mg daily and  atorvastatin  40 mg daily -denies side effects Blood pressure today 137/92 Reports complete tobacco cessation since hospital discharge  No further concerns at this time   Stroke admission 10/07/2020 KYNSLEY WHITEHOUSE is a 68 y.o. 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  She presented to [facility] ED on 10/07/2020 with severe headache, left-sided weakness and left facial droop.  Personally reviewed hospitalization pertinent progress notes, lab work and imaging with summary provided.  Initially evaluated by Dr. Jerri with stroke work-up revealing acute ischemic right MCA stroke with right M2/MCA s/p IR with TICI 2C revascularization, concerning for cardioembolic source given cardiac history.  Unable to obtain MRI due to recent pacemaker switch.  ICD interrogation negative for A. fib.  2D echo showed EF 30 to 35%.  LDL 65.  A1c 6.1.  Current tobacco use of smoking cessation counseling provided.  Other stroke risk factors include nonischemic cardiomyopathy, myopericarditis, reported medication noncompliance, advanced age, obesity and prior stroke history.   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 Code Stroke CT head:  Hypodensity right frontal parietal lobe suspicious for acute infarct. In addition, hyperdense sign at right sylvian fissure MCA CTA head & neck : Right M2/MCA posterior division branch occlusion with moderate distal branch opacification via leptomeningeal collaterals.  MRI: Contraindicated due to recent pacemaker switch, per MRI technician need a minimum of 6 weeks from procedure. (09/25/2020) IR - R M2 inferior division TICI 2c revasc CT head post op 1/25 - unchanged CT repeat 10/09/2020: Subacute right frontoparietal and right temporal infarct.  SAH within the right sylvian fissure is unchanged CT repeat 10/13/2020: Right MCA distribution infarct stable in size.  No evidence for hemorrhagic transformation.  Decrease hypodensity within  the right sylvian fissure. 2D Echo -  EF 30 to 35%.  ICD interrogation no afib - new ICD from 09/25/20 LDL 65 HgbA1c: 6.1 VTE prophylaxis -SCDs aspirin  81 mg daily prior to admission, now on aspirin  81 mg daily, started on 10/13/2020 after repeat CT scan Topamax  25 mg twice daily for headache Therapy recommendations: CIR  Disposition: Today to CIR    ROS:   14 system review of systems performed and negative with exception of those listed in HPI  PMH:  Past Medical History:  Diagnosis Date   AICD (automatic cardioverter/defibrillator) present    COPD (chronic obstructive pulmonary disease) (HCC)    Hypothyroidism    Myocardial infarction (HCC)    Pacemaker/defibrillator    Stroke (HCC)     PSH:  Past Surgical History:  Procedure Laterality Date   ABDOMINAL HYSTERECTOMY     CHOLECYSTECTOMY     INSERT / REPLACE / REMOVE PACEMAKER     IR CT HEAD LTD  10/07/2020   IR PERCUTANEOUS ART THROMBECTOMY/INFUSION INTRACRANIAL INC DIAG ANGIO  10/07/2020   RADIOLOGY WITH ANESTHESIA N/A 10/07/2020   Procedure: IR WITH ANESTHESIA - CODE STROKE;  Surgeon: Radiologist, Medication, MD;  Location: [facility] OR;  Service: Radiology;  Laterality: N/A;   THYROIDECTOMY     TONSILLECTOMY      Social History:  Social History   Socioeconomic History   Marital status: Widowed    Spouse name:  Not on file   Number of children: Not on file   Years of education: Not on file   Highest education level: Not on file  Occupational History   Not on file  Tobacco Use   Smoking status: Every Day    Current packs/day: 2.00    Average packs/day: 2.0 packs/day for 15.0 years (30.0 ttl pk-yrs)    Types: Cigarettes   Smokeless tobacco: Never   Tobacco comments:    1 PPD- 08/17/2022  Vaping Use   Vaping status: Never Used  Substance and Sexual Activity   Alcohol use: No   Drug use: No   Sexual activity: Not Currently  Other Topics Concern   Not on file  Social History Narrative   09/17/21 lives with dad ad  sister   Social Drivers of Health   Financial Resource Strain: Low Risk  (01/20/2022)   Received from [facility]   Overall Financial Resource Strain (CARDIA)    Difficulty of Paying Living Expenses: Not hard at all  Food Insecurity: No Food Insecurity (01/20/2022)   Received from [facility]   Hunger Vital Sign    Within the past 12 months, you worried that your food would run out before you got the money to buy more.: Never true    Within the past 12 months, the food you bought just didn't last and you didn't have money to get more.: Never true  Transportation Needs: No Transportation Needs (01/20/2022)   Received from [facility]   PRAPARE - Transportation    Lack of Transportation (Medical): No    Lack of Transportation (Non-Medical): No  Physical Activity: Not on file  Stress: Not on file  Social Connections: Not on file  Intimate Partner Violence: Not on file    Family History:  Family History  Problem Relation Age of Onset   Diabetes Mother    Heart disease Mother 9       stent placed    Macular degeneration Father    Diabetes Brother    Diabetes Brother     Medications:   Current Outpatient Medications on File Prior to Visit  Medication Sig Dispense Refill   albuterol (PROVENTIL) (2.5 MG/3ML) 0.083% nebulizer solution Take 2.5 mg by nebulization as needed for wheezing or shortness of breath.     amitriptyline  (ELAVIL ) 50 MG tablet Take 1 tablet (50 mg total) by mouth at bedtime. 90 tablet 3   apixaban  (ELIQUIS ) 5 MG TABS tablet Take 1 tablet by mouth twice daily 180 tablet 1   atorvastatin  (LIPITOR) 40 MG tablet Take 1 tablet (40 mg total) by mouth daily. 90 tablet 3   EMGALITY  120 MG/ML SOAJ INJECT 240MG  (TWO PENS) INTO THE SKIN ONCE FOR ONE DOSE 2 mL 0   levothyroxine (SYNTHROID) 88 MCG tablet Take 88 mcg by mouth daily.     losartan  (COZAAR ) 25 MG tablet Take 1 tablet (25 mg total) by mouth daily. 90 tablet 3   metoprolol  succinate (TOPROL  XL) 25 MG  24 hr tablet Take 1 tablet (25 mg total) by mouth daily. 30 tablet 1   omeprazole (PRILOSEC) 40 MG capsule Take 40 mg by mouth daily.     Semaglutide -Weight Management (WEGOVY ) 2.4 MG/0.75ML SOAJ INJECT 2.4MG  SUBCUTANEOUSLY ONCE A WEEK 4 mL 6   spironolactone  (ALDACTONE ) 25 MG tablet Take 1 tablet (25 mg total) by mouth daily. 30 tablet 5   topiramate  (TOPAMAX ) 25 MG tablet Take 1 tablet by mouth twice daily 180 tablet  1   Ubrogepant  (UBRELVY ) 100 MG TABS Take 1 tablet (100 mg total) by mouth as needed. Can repeat x1 after 2 hours if needed, max dose 200mg /24hr 16 tablet 5   No current facility-administered medications on file prior to visit.    Allergies:   Allergies  Allergen Reactions   Codeine Shortness Of Breath   Aspirin  Other (See Comments)    burns the patient's stomach   Latex Other (See Comments)    Makes the skin peel    Other Nausea Only and Other (See Comments)    Spicy foods- Upset the patient's stomach      OBJECTIVE:  Physical Exam  There were no vitals filed for this visit.  There is no height or weight on file to calculate BMI. No results found.  General: well developed, well nourished,  pleasant middle-aged Caucasian female, seated, in no evident distress Head: head normocephalic and atraumatic.   Neck: supple with no carotid or supraclavicular bruits Cardiovascular: regular rate and rhythm, no murmurs Musculoskeletal: no deformity Skin:  no rash/petichiae Vascular:  Normal pulses all extremities   Neurologic Exam Mental Status: Awake and fully alert.  Fluent speech and language.  Oriented to place and time. Recent memory subjectively impaired and remote memory intact. Attention span, concentration and fund of knowledge appropriate appropriate during visit. Mood and affect appropriate.  Cranial Nerves: Pupils equal, briskly reactive to light. Extraocular movements full without nystagmus. Visual fields full to confrontation. Hearing intact. Facial  sensation intact. Face, tongue, palate moves normally and symmetrically.  Motor: Normal strength, bulk and tone on right upper and lower extremity.  LUE: 4+/5 with decreased grip strength and hand dexterity although giveaway weakness noted; able to make a fist and open hand although slow LLE: 5/5 Sensory.:  intact to light touch, vibratory and pinprick sensation Coordination: Rapid alternating movements normal in all extremities except slightly decreased decreased left hand. Finger-to-nose and heel-to-shin performed accurately bilaterally. Gait and Station: Stands from seated position without difficulty.  Gait demonstrates normal stride length and balance without use of assistive device.   Reflexes: 1+ and symmetric. Toes downgoing.         ASSESSMENT: JENNA ROUTZAHN is a 68 y.o. year old female with history of right MCA infarct with right M2/MCA posterior division branch occlusion s/p IR with TICI 2C reperfusion, concerning for cardioembolic source given extensive cardiac history on 10/07/2020. Vascular risk factors include polysubstance abuse, chronic pain, nonischemic cardiomyopathy (recent EF 30 to 35%), s/p chamber replacement for pacemaker 09/25/2020, new dx of A.fib, history of postpartum stroke, tobacco use, HTN, pre-DM and obesity.     PLAN:  R MCA stroke:  Residual deficit: mild left sided deficits. Increased left hand spasticity suspect due to increased stress. Neuro exam intact compared to prior visit. Offered OT but due to busy schedule, declines interest.  She was provided exercises to do at home but if symptoms persist, consider referral to PMR for botox Continue Eliquis  (apixaban ) daily and atorvastatin  40mg  daily for secondary stroke prevention managed/prescribed by PCP/cardiology.   Discussed secondary stroke prevention measures and importance of close PCP follow up for aggressive stroke risk factor management including HTN with BP goal<130/90, HLD with LDL goal<70 and  pre-DM with A1c goal<7   Chronic headaches Suspect mixed migrainous, tension and overuse headaches with gradual worsening like due to significant home stressors  Recommend starting Ajovy  monthly injection, provided sample today Recommend trying Ubrelvy  100mg  for rescue, can repeat x1 after 2 hrs if needed. Max  dose 200mg /24hrs. Discussed rebound headache with more frequent use.  Limited to no benefit with Nurtec.  Triptans contraindicated d/t stroke history  Continue topiramate  25 mg twice daily, restart amitriptyline  50 mg nightly once able       Follow up in 6 months or call earlier if needed     CC:  PCP: Lizette Lango, MD    I personally spent a total of *** minutes in the care of the patient today including {Time Based Coding:210964241}.   Harlene Bogaert, AGNP-BC  [facility] [address] [place], [place] [address]  Phone [phone] Fax [phone] Note: This document was prepared with digital dictation and possible smart phrase technology. Any transcriptional errors that result from this process are unintentional.

## 2024-06-05 ENCOUNTER — Encounter: Admitting: Cardiology

## 2024-06-12 ENCOUNTER — Ambulatory Visit: Attending: Physician Assistant

## 2024-06-12 ENCOUNTER — Ambulatory Visit: Payer: Self-pay | Admitting: Physician Assistant

## 2024-06-12 DIAGNOSIS — I428 Other cardiomyopathies: Secondary | ICD-10-CM | POA: Diagnosis not present

## 2024-06-12 LAB — ECHOCARDIOGRAM COMPLETE
AR max vel: 2.68 cm2
AV Area VTI: 2.46 cm2
AV Area mean vel: 2.43 cm2
AV Mean grad: 6 mmHg
AV Peak grad: 10.1 mmHg
Ao pk vel: 1.59 m/s
Area-P 1/2: 2.69 cm2
S' Lateral: 4.31 cm

## 2024-06-13 NOTE — Progress Notes (Signed)
 Remote ICD Transmission

## 2024-06-15 ENCOUNTER — Ambulatory Visit: Admitting: Physician Assistant

## 2024-06-29 NOTE — Progress Notes (Unsigned)
 Cardiology Office Note    Date:  07/03/2024   ID:  Rebecca Singh, DOB 1955-12-04, MRN [id number]  PCP:  Lizette Lango, MD  Cardiologist:  Redell Cave, MD  Electrophysiologist:  OLE ONEIDA HOLTS, MD   Chief Complaint: Follow-up  History of Present Illness:   Rebecca Singh is a 68 y.o. 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  Remote LHC in 2008 minimal CAD with EF 55%.  Adenosine stress test in 2009 showed no evidence of ischemia or scar with an EF of 45 to 50%.   She was previously followed by [facility] cardiology with device initially implanted in 11/2013 and revision a few days later.  Echo in 2015 showed an EF of 30 to 35%.  In 2016, echo showed an EF of 65 to 70% with normal RV systolic function and moderate LVH.  Echo in 2019 demonstrated an EF of 55 to 60% with grade 1 diastolic dysfunction.  Lexiscan  MPI in 2019 showed no evidence of ischemia.  She was admitted to the hospital in 09/2020 with severe headache and left-sided weakness.  She was found to have an acute right middle cerebral artery CVA and underwent emergent thrombectomy.  She has residual left-sided weakness from stroke.  Echo in 09/2020 showed an EF of 30 to 35%, grade 1 diastolic dysfunction, mildly reduced RV systolic function with normal ventricular cavity size and insufficiency.  She has since transitioned her care to Drs. Agbor-Etang and HOLTS.  Echo from 05/2022 showed an EF of 35 to 40%, global hypokinesis, grade 1 diastolic dysfunction, normal RV systolic function and ventricular cavity size, and mild aortic insufficiency.  Financial constraints have limited GDMT including SGLT2 inhibitor and ARNI.  She was seen in the office in 06/2022 and was  noted to have restarted smoking.  She was not sleeping well with noted daytime fatigue.  She was referred to pulmonology for PFTs and consideration of sleep study.   She was seen in the office in 12/2021 and was interested in GLP-1 therapy.  She was not interested in moving forward with a sleep study.  She continued to smoke.  She was initiated on Wegovy .  At follow-up visit in 07/2023, her weight was down 30 pounds when compared to her visit in 12/2022 with the addition of Wegovy .     She was seen by general cardiology in 12/2023 and reported an increase in stress.  Her weight was down another 23 pounds, which was intentional with lifestyle modification and GLP-1 therapy.  Echo in 05/2024 showed an EF of 45 to 50%, global hypokinesis, grade 1 diastolic dysfunction, normal RV systolic function, ventricular cavity size, and RVSP, and mild to moderate mitral regurgitation.  She comes in doing very well from a cardiac perspective and is without symptoms of angina or cardiac decompensation.  At this time she reports she feels great.  No dyspnea, lower extremity swelling, progressive orthopnea, early satiety, falls, hematochezia, or melena.  Adherent to cardiac pharmacotherapy without untoward effect.  Continues to smoke 1 pack of cigarettes daily and is not yet ready to quit.  Has reduced red bowls from 5 to 6/day down to 1/day drinks less than 2 L of liquids daily.  Her weight is down 17 pounds compared to when  I saw her in 12/2023, this is intentional with lifestyle modification and GLP-1 therapy.  She is very pleased with her progress and does not have any acute cardiac concerns at this time.   Labs independently reviewed: 03/2024 - TSH 0.383, potassium 4.5, BUN 24, serum creatinine 0.91 07/2023 - TC 111, TG 54, HDL 53, LDL 45, albumin 4.1, AST/ALT normal, Hgb 12.9, PLT 264 09/2022 - magnesium 2.1  Past Medical History:  Diagnosis Date   AICD (automatic cardioverter/defibrillator) present    COPD (chronic  obstructive pulmonary disease) (HCC)    Hypothyroidism    Myocardial infarction (HCC)    Pacemaker/defibrillator    Stroke [facility])     Past Surgical History:  Procedure Laterality Date   ABDOMINAL HYSTERECTOMY     CHOLECYSTECTOMY     INSERT / REPLACE / REMOVE PACEMAKER     IR CT HEAD LTD  10/07/2020   IR PERCUTANEOUS ART THROMBECTOMY/INFUSION INTRACRANIAL INC DIAG ANGIO  10/07/2020   RADIOLOGY WITH ANESTHESIA N/A 10/07/2020   Procedure: IR WITH ANESTHESIA - CODE STROKE;  Surgeon: Radiologist, Medication, MD;  Location: [facility] OR;  Service: Radiology;  Laterality: N/A;   THYROIDECTOMY     TONSILLECTOMY      Current Medications: Current Meds  Medication Sig   albuterol (PROVENTIL) (2.5 MG/3ML) 0.083% nebulizer solution Take 2.5 mg by nebulization as needed for wheezing or shortness of breath.   amitriptyline  (ELAVIL ) 50 MG tablet Take 1 tablet (50 mg total) by mouth at bedtime.   apixaban  (ELIQUIS ) 5 MG TABS tablet Take 1 tablet by mouth twice daily   atorvastatin  (LIPITOR) 40 MG tablet Take 1 tablet (40 mg total) by mouth daily.   EMGALITY  120 MG/ML SOAJ INJECT 240MG  (TWO PENS) INTO THE SKIN ONCE FOR ONE DOSE   levothyroxine (SYNTHROID) 88 MCG tablet Take 88 mcg by mouth daily.   losartan  (COZAAR ) 25 MG tablet Take 1 tablet (25 mg total) by mouth daily.   metoprolol  succinate (TOPROL  XL) 25 MG 24 hr tablet Take 1 tablet (25 mg total) by mouth daily.   omeprazole (PRILOSEC) 40 MG capsule Take 40 mg by mouth daily.   Semaglutide -Weight Management (WEGOVY ) 2.4 MG/0.75ML SOAJ INJECT 2.4MG  SUBCUTANEOUSLY ONCE A WEEK   spironolactone  (ALDACTONE ) 25 MG tablet Take 1 tablet (25 mg total) by mouth daily.   topiramate  (TOPAMAX ) 25 MG tablet Take 1 tablet by mouth twice daily   Ubrogepant  (UBRELVY ) 100 MG TABS Take 1 tablet (100 mg total) by mouth as needed. Can repeat x1 after 2 hours if needed, max dose 200mg /24hr    Allergies:   Codeine, Aspirin , Latex, and Other   Social History    Socioeconomic History   Marital status: Widowed    Spouse name: Not on file   Number of children: Not on file   Years of education: Not on file   Highest education level: Not on file  Occupational History   Not on file  Tobacco Use   Smoking status: Every Day    Current packs/day: 2.00    Average packs/day: 2.0 packs/day for 15.0 years (30.0 ttl pk-yrs)    Types: Cigarettes   Smokeless tobacco: Never   Tobacco comments:    1 PPD- 08/17/2022  Vaping Use   Vaping status: Never Used  Substance and Sexual Activity   Alcohol use: No   Drug use: No   Sexual activity: Not Currently  Other Topics Concern   Not on file  Social History Narrative   09/17/21 lives with dad ad  sister   Social Drivers of Corporate investment banker Strain: Low Risk  (01/20/2022)   [facility]   Overall Financial Resource Strain (CARDIA)    Difficulty of Paying Living Expenses: Not hard at all  Food Insecurity: No Food Insecurity (01/20/2022)   Received from [facility]   Hunger Vital Sign    Within the past 12 months, you worried that your food would run out before you got the money to buy more.: Never true    Within the past 12 months, the food you bought just didn't last and you didn't have money to get more.: Never true  Transportation Needs: No Transportation Needs (01/20/2022)   Received from [facility] - Transportation    Lack of Transportation (Medical): No    Lack of Transportation (Non-Medical): No  Physical Activity: Not on file  Stress: Not on file  Social Connections: Not on file     Family History:  The patient's family history includes Diabetes in her brother, brother, and mother; Heart disease (age of onset: 15) in her mother; Macular degeneration in her father.  ROS:   12-point review of systems is negative unless otherwise noted in the HPI.   EKGs/Labs/Other Studies Reviewed:    Studies reviewed were summarized above. The additional  studies were reviewed today:  2D echo 06/12/2024: 1. Left ventricular ejection fraction, by estimation, is 45 to 50%. Left  ventricular ejection fraction by 3D volume is 48 %. The left ventricle has  mildly decreased function. The left ventricle demonstrates global  hypokinesis. Left ventricular diastolic   parameters are consistent with Grade I diastolic dysfunction (impaired  relaxation).   2. Right ventricular systolic function is normal. The right ventricular  size is normal. There is normal pulmonary artery systolic pressure.   3. Tricuspid valve regurgitation is mild to moderate.   Comparison(s): A prior study was performed on 05/19/2022. The left  ventricular function has improved.  __________  Limited echo 05/19/2022: 1. Left ventricular ejection fraction, by estimation, is 35 to 40%. The  left ventricle has moderately decreased function. The left ventricle  demonstrates global hypokinesis. Left ventricular diastolic parameters are  consistent with Grade I diastolic  dysfunction (impaired relaxation).   2. Right ventricular systolic function is normal. The right ventricular  size is normal.   3. Aortic valve regurgitation is mild.   Comparison(s): Previous LVEF reported as 30-35%.  __________   2D echo 10/09/2020: 1. Left ventricular ejection fraction, by estimation, is 30 to 35%. The  left ventricle has moderately decreased function. The left ventricle  demonstrates global hypokinesis. The left ventricular internal cavity size  was mildly dilated. There is mild  left ventricular hypertrophy. Left ventricular diastolic parameters are  consistent with Grade I diastolic dysfunction (impaired relaxation).   2. Right ventricular systolic function is mildly reduced. The right  ventricular size is normal. Tricuspid regurgitation signal is inadequate  for assessing PA pressure.   3. Left atrial size was mildly dilated.   4. The mitral valve is normal in structure. No evidence of  mitral valve  regurgitation. No evidence of mitral stenosis.   5. The aortic valve is tricuspid. Aortic valve regurgitation is mild. No  aortic stenosis is present.   6. The inferior vena cava is normal in size with greater than 50%  respiratory variability, suggesting right atrial pressure of 3 mmHg.  __________   Lexiscan  MPI 11/17/2017: Pharmacological myocardial perfusion imaging study with no  significant  Ischemia Small region of mild fixed defect in the distal inferior, apical region, likely secondary to significant GI uptake artifact Normal wall motion, EF estimated at 48% (depressed secondary to GI uptake artifact) No EKG changes concerning for ischemia at peak stress or in recovery. EKG showing paced rhythm Low risk scan, Echocardiogram documenting normal ejection fraction, EF greater than 55%  __________   2D echo 11/16/2017: - Left ventricle: The cavity size was normal. Wall thickness was    normal. Systolic function was normal. The estimated ejection    fraction was in the range of 55% to 60%. Wall motion was normal;    there were no regional wall motion abnormalities. Doppler    parameters are consistent with abnormal left ventricular    relaxation (grade 1 diastolic dysfunction).  - Aortic valve: There was trivial regurgitation.   Impressions:   - Normal LVF    Normal Wall Motion    EF=55-60%    Normal Right side    Pacer wire in RV. Normal study. No cardiac source of emboli was    indentified.    EKG:  EKG is not ordered today.    Recent Labs: 07/22/2023: ALT 20; BUN 23; Creatinine, Ser 1.13; Hemoglobin 12.9; Platelets 264; Potassium 4.4; Sodium 141  Recent Lipid Panel    Component Value Date/Time   CHOL 111 07/22/2023 1606   TRIG 54 07/22/2023 1606   HDL 53 07/22/2023 1606   CHOLHDL 2.1 07/22/2023 1606   CHOLHDL 2.5 10/08/2020 0443   VLDL 10 10/08/2020 0443   LDLCALC 45 07/22/2023 1606   LDLDIRECT 49 07/22/2023 1606    PHYSICAL EXAM:    VS:  BP 120/70  (BP Location: Left Arm, Patient Position: Sitting, Cuff Size: Normal)   Pulse 64   Ht 5' 5.5 (1.664 m)   Wt 148 lb 9.6 oz (67.4 kg)   SpO2 99%   BMI 24.35 kg/m   BMI: Body mass index is 24.35 kg/m.  Physical Exam Vitals reviewed.  Constitutional:      Appearance: She is well-developed.  HENT:     Head: Normocephalic and atraumatic.  Eyes:     General:        Right eye: No discharge.        Left eye: No discharge.  Cardiovascular:     Rate and Rhythm: Normal rate and regular rhythm.     Heart sounds: Normal heart sounds, S1 normal and S2 normal. Heart sounds not distant. No midsystolic click and no opening snap. No murmur heard.    No friction rub.  Pulmonary:     Effort: Pulmonary effort is normal. No respiratory distress.     Breath sounds: Normal breath sounds. No decreased breath sounds, wheezing, rhonchi or rales.  Musculoskeletal:     Cervical back: Normal range of motion.     Right lower leg: No edema.     Left lower leg: No edema.  Skin:    General: Skin is warm and dry.     Nails: There is no clubbing.  Neurological:     Mental Status: She is alert and oriented to person, place, and time.  Psychiatric:        Speech: Speech normal.        Behavior: Behavior normal.        Thought Content: Thought content normal.        Judgment: Judgment normal.     Wt Readings from Last 3 Encounters:  07/03/24 148 lb 9.6 oz (  67.4 kg)  04/26/24 157 lb (71.2 kg)  01/10/24 165 lb 6.4 oz (75 kg)     ASSESSMENT & PLAN:   HFrEF secondary to NICM with LBBB status post CRT-D: Euvolemic and well compensated with NYHA class I-II symptoms.  Initial EF 30 to 35% with most recent EF of 45 to 50% by echo in 05/2022.  Financial constraints have precluded use of ARNI or SGLT2 inhibitor.  She remains on losartan  25 mg, Toprol -XL 25 mg, and spironolactone  25 mg.  Not requiring a standing loop diuretic.  No device alarms or shocks.  With improvement in symptoms and cardiomyopathy, defer  escalation of pharmacotherapy at this time.  Follow-up with EP for management of CRT as directed.  Update labs.  PAF: Regular rate and rhythm on exam today.  Remains on Toprol -XL 25 mg.  CHA2DS2-VASc at least 6 (CHF, age x 1, stroke x 2, vascular disease, sex category).  Continue apixaban  5 mg twice daily.  She does not meet reduced dosing criteria.  No falls or symptoms concerning for bleeding.  Update labs.  History of CVA: No new deficits.  She remains on apixaban  and atorvastatin .  Aortic atherosclerosis: LDL 45 in 07/2023 with normal AST/ALT at that time.  Update LFT, lipid panel, and direct LDL.  She remains on atorvastatin  40 mg.  History of obesity: Continues to note significant weight loss with lifestyle modification and to be 1 therapy tolerating Wegovy  without off target effect.  Continued heart healthy diet and regular exercise recommended.  Tobacco use: Continues to smoke 1 pack/day.  Complete cessation recommended.  Not yet ready to quit.    Disposition: F/u with Dr. Darliss or an APP in 6 months, and EP as directed.    Medication Adjustments/Labs and Tests Ordered: Current medicines are reviewed at length with the patient today.  Concerns regarding medicines are outlined above. Medication changes, Labs and Tests ordered today are summarized above and listed in the Patient Instructions accessible in Encounters.   Signed, Bernardino Bring, PA-C 07/03/2024 3:04 PM     [facility] [place] [address] [place], [place] [address] [phone]

## 2024-07-03 ENCOUNTER — Encounter: Payer: Self-pay | Admitting: Physician Assistant

## 2024-07-03 ENCOUNTER — Ambulatory Visit: Attending: Physician Assistant | Admitting: Physician Assistant

## 2024-07-03 VITALS — BP 120/70 | HR 64 | Ht 65.5 in | Wt 148.6 lb

## 2024-07-03 DIAGNOSIS — I5020 Unspecified systolic (congestive) heart failure: Secondary | ICD-10-CM | POA: Diagnosis not present

## 2024-07-03 DIAGNOSIS — Z8673 Personal history of transient ischemic attack (TIA), and cerebral infarction without residual deficits: Secondary | ICD-10-CM

## 2024-07-03 DIAGNOSIS — Z95810 Presence of automatic (implantable) cardiac defibrillator: Secondary | ICD-10-CM | POA: Diagnosis not present

## 2024-07-03 DIAGNOSIS — Z79899 Other long term (current) drug therapy: Secondary | ICD-10-CM

## 2024-07-03 DIAGNOSIS — I700 Atherosclerosis of aorta: Secondary | ICD-10-CM

## 2024-07-03 DIAGNOSIS — I428 Other cardiomyopathies: Secondary | ICD-10-CM | POA: Diagnosis not present

## 2024-07-03 DIAGNOSIS — I480 Paroxysmal atrial fibrillation: Secondary | ICD-10-CM | POA: Diagnosis not present

## 2024-07-03 DIAGNOSIS — Z720 Tobacco use: Secondary | ICD-10-CM

## 2024-07-03 NOTE — Patient Instructions (Signed)
 Medication Instructions:  Your physician recommends that you continue on your current medications as directed. Please refer to the Current Medication list given to you today.    *If you need a refill on your cardiac medications before your next appointment, please call your pharmacy*  Lab Work: Your provider would like for you to have following labs drawn today CMP, Mg, CBC, Lipid, direct LDL.     Testing/Procedures: No test ordered today   Follow-Up: At [facility], you and your health needs are our priority.  As part of our continuing mission to provide you with exceptional heart care, our providers are all part of one team.  This team includes your primary Cardiologist (physician) and Advanced Practice Providers or APPs (Physician Assistants and Nurse Practitioners) who all work together to provide you with the care you need, when you need it.  Your next appointment:   6 month(s)  Provider:   Redell Cave, MD or Bernardino Bring, PA-C

## 2024-07-04 ENCOUNTER — Ambulatory Visit: Payer: Self-pay | Admitting: Physician Assistant

## 2024-07-04 LAB — COMPREHENSIVE METABOLIC PANEL WITH GFR
ALT: 19 IU/L (ref 0–32)
AST: 25 IU/L (ref 0–40)
Albumin: 4.2 g/dL (ref 3.9–4.9)
Alkaline Phosphatase: 79 IU/L (ref 49–135)
BUN/Creatinine Ratio: 29 — ABNORMAL HIGH (ref 12–28)
BUN: 25 mg/dL (ref 8–27)
Bilirubin Total: 0.5 mg/dL (ref 0.0–1.2)
CO2: 20 mmol/L (ref 20–29)
Calcium: 10.2 mg/dL (ref 8.7–10.3)
Chloride: 108 mmol/L — ABNORMAL HIGH (ref 96–106)
Creatinine, Ser: 0.85 mg/dL (ref 0.57–1.00)
Globulin, Total: 2.4 g/dL (ref 1.5–4.5)
Glucose: 80 mg/dL (ref 70–99)
Potassium: 5 mmol/L (ref 3.5–5.2)
Sodium: 139 mmol/L (ref 134–144)
Total Protein: 6.6 g/dL (ref 6.0–8.5)
eGFR: 74 mL/min/1.73 (ref >59)

## 2024-07-04 LAB — CBC
Hematocrit: 41.3 % (ref 34.0–46.6)
Hemoglobin: 13.7 g/dL (ref 11.1–15.9)
MCH: 31.4 pg (ref 26.6–33.0)
MCHC: 33.2 g/dL (ref 31.5–35.7)
MCV: 95 fL (ref 79–97)
Platelets: 278 x10E3/uL (ref 150–450)
RBC: 4.37 x10E6/uL (ref 3.77–5.28)
RDW: 14.1 % (ref 11.7–15.4)
WBC: 6 x10E3/uL (ref 3.4–10.8)

## 2024-07-04 LAB — MAGNESIUM: Magnesium: 2.2 mg/dL (ref 1.6–2.3)

## 2024-07-04 LAB — LIPID PANEL
Chol/HDL Ratio: 2.3 ratio (ref 0.0–4.4)
Cholesterol, Total: 120 mg/dL (ref 100–199)
HDL: 52 mg/dL (ref >39)
LDL Chol Calc (NIH): 56 mg/dL (ref 0–99)
Triglycerides: 49 mg/dL (ref 0–149)
VLDL Cholesterol Cal: 12 mg/dL (ref 5–40)

## 2024-07-04 LAB — LDL CHOLESTEROL, DIRECT: LDL Direct: 57 mg/dL (ref 0–99)

## 2024-07-11 ENCOUNTER — Ambulatory Visit (INDEPENDENT_AMBULATORY_CARE_PROVIDER_SITE_OTHER): Payer: Medicare HMO

## 2024-07-11 DIAGNOSIS — I428 Other cardiomyopathies: Secondary | ICD-10-CM

## 2024-07-12 ENCOUNTER — Ambulatory Visit: Payer: Self-pay | Admitting: Cardiology

## 2024-07-12 LAB — CUP PACEART REMOTE DEVICE CHECK
Battery Remaining Longevity: 54 mo
Battery Remaining Percentage: 62 %
Brady Statistic RA Percent Paced: 15 %
Brady Statistic RV Percent Paced: 96 %
Date Time Interrogation Session: 20251029025100
HighPow Impedance: 54 Ohm
Implantable Lead Connection Status: 753985
Implantable Lead Connection Status: 753985
Implantable Lead Connection Status: 753985
Implantable Lead Implant Date: 20150301
Implantable Lead Implant Date: 20150301
Implantable Lead Implant Date: 20150301
Implantable Lead Location: 753858
Implantable Lead Location: 753859
Implantable Lead Location: 753860
Implantable Lead Model: 4470
Implantable Lead Model: 4677
Implantable Lead Model: 696
Implantable Lead Serial Number: 145508
Implantable Lead Serial Number: 501050
Implantable Lead Serial Number: 769161
Implantable Pulse Generator Implant Date: 20220113
Lead Channel Impedance Value: 364 Ohm
Lead Channel Impedance Value: 481 Ohm
Lead Channel Impedance Value: 601 Ohm
Lead Channel Setting Pacing Amplitude: 2 V
Lead Channel Setting Pacing Amplitude: 2.8 V
Lead Channel Setting Pacing Amplitude: 3.5 V
Lead Channel Setting Pacing Pulse Width: 0.5 ms
Lead Channel Setting Pacing Pulse Width: 1 ms
Lead Channel Setting Sensing Sensitivity: 0.6 mV
Lead Channel Setting Sensing Sensitivity: 1 mV
Pulse Gen Serial Number: 267431
Zone Setting Status: 755011

## 2024-07-18 NOTE — Progress Notes (Signed)
 Remote ICD Transmission

## 2024-08-28 ENCOUNTER — Other Ambulatory Visit: Payer: Self-pay | Admitting: Adult Health

## 2024-09-28 ENCOUNTER — Telehealth: Payer: Self-pay | Admitting: Physician Assistant

## 2024-09-28 NOTE — Telephone Encounter (Signed)
 Pt c/o medication issue:  1. Name of Medication:  Semaglutide -Weight Management (WEGOVY ) 2.4 MG/0.75ML SOAJ      2. How are you currently taking this medication (dosage and times per day)? As written  3. Are you having a reaction (difficulty breathing--STAT)? No  4. What is your medication issue? Patient insurance is requesting authorization for the injections

## 2024-10-02 ENCOUNTER — Telehealth: Payer: Self-pay | Admitting: Pharmacy Technician

## 2024-10-02 ENCOUNTER — Other Ambulatory Visit (HOSPITAL_COMMUNITY): Payer: Self-pay

## 2024-10-02 NOTE — Telephone Encounter (Signed)
 Pharmacy Patient Advocate Encounter   Received notification from Pt Calls Messages that prior authorization for Wegovy  2.4 MG is required/requested.   Insurance verification completed.   The patient is insured through [employer].   Per test claim: PA required; PA submitted to above mentioned insurance via Latent Key/confirmation #/EOC [id number] Status is pending

## 2024-10-03 ENCOUNTER — Other Ambulatory Visit (HOSPITAL_COMMUNITY): Payer: Self-pay

## 2024-10-03 NOTE — Telephone Encounter (Signed)
 Pharmacy Patient Advocate Encounter  Received notification from [facility] that Prior Authorization for Wegovy  has been APPROVED from 10/02/24 to 09/12/25. Ran test claim, Copay is $12.65- one month. This test claim was processed through [facility]- copay amounts may vary at other pharmacies due to pharmacy/plan contracts, or as the patient moves through the different stages of their insurance plan.   PA #/Case ID/Reference #: [id number]

## 2024-10-05 ENCOUNTER — Other Ambulatory Visit: Payer: Self-pay | Admitting: Adult Health

## 2024-10-10 ENCOUNTER — Ambulatory Visit: Attending: Cardiology

## 2024-10-10 DIAGNOSIS — I428 Other cardiomyopathies: Secondary | ICD-10-CM

## 2024-10-10 LAB — CUP PACEART REMOTE DEVICE CHECK
Battery Remaining Longevity: 54 mo
Battery Remaining Percentage: 61 %
Brady Statistic RA Percent Paced: 16 %
Brady Statistic RV Percent Paced: 96 %
Date Time Interrogation Session: 20260128025100
HighPow Impedance: 46 Ohm
Implantable Lead Connection Status: 753985
Implantable Lead Connection Status: 753985
Implantable Lead Connection Status: 753985
Implantable Lead Implant Date: 20150301
Implantable Lead Implant Date: 20150301
Implantable Lead Implant Date: 20150301
Implantable Lead Location: 753858
Implantable Lead Location: 753859
Implantable Lead Location: 753860
Implantable Lead Model: 4470
Implantable Lead Model: 4677
Implantable Lead Model: 696
Implantable Lead Serial Number: 145508
Implantable Lead Serial Number: 501050
Implantable Lead Serial Number: 769161
Implantable Pulse Generator Implant Date: 20220113
Lead Channel Impedance Value: 318 Ohm
Lead Channel Impedance Value: 434 Ohm
Lead Channel Impedance Value: 443 Ohm
Lead Channel Setting Pacing Amplitude: 2 V
Lead Channel Setting Pacing Amplitude: 2.8 V
Lead Channel Setting Pacing Amplitude: 3.5 V
Lead Channel Setting Pacing Pulse Width: 0.5 ms
Lead Channel Setting Pacing Pulse Width: 1 ms
Lead Channel Setting Sensing Sensitivity: 0.6 mV
Lead Channel Setting Sensing Sensitivity: 1 mV
Pulse Gen Serial Number: 267431
Zone Setting Status: 755011

## 2024-10-14 ENCOUNTER — Ambulatory Visit: Payer: Self-pay | Admitting: Cardiology

## 2024-10-18 NOTE — Progress Notes (Signed)
 Remote ICD Transmission

## 2025-01-09 ENCOUNTER — Ambulatory Visit

## 2025-04-10 ENCOUNTER — Ambulatory Visit

## 2025-07-10 ENCOUNTER — Ambulatory Visit

## 2025-10-09 ENCOUNTER — Ambulatory Visit
